# Patient Record
Sex: Male | Born: 1965
Health system: Southern US, Community
[De-identification: ages and names within clinical notes are randomized; demographics above are authoritative.]

## PROBLEM LIST (undated history)

## (undated) DIAGNOSIS — Z720 Tobacco use: Secondary | ICD-10-CM

## (undated) DIAGNOSIS — I809 Phlebitis and thrombophlebitis of unspecified site: Secondary | ICD-10-CM

## (undated) DIAGNOSIS — M549 Dorsalgia, unspecified: Secondary | ICD-10-CM

## (undated) DIAGNOSIS — E119 Type 2 diabetes mellitus without complications: Secondary | ICD-10-CM

## (undated) DIAGNOSIS — D689 Coagulation defect, unspecified: Secondary | ICD-10-CM

## (undated) DIAGNOSIS — E039 Hypothyroidism, unspecified: Secondary | ICD-10-CM

## (undated) DIAGNOSIS — I2510 Atherosclerotic heart disease of native coronary artery without angina pectoris: Secondary | ICD-10-CM

## (undated) DIAGNOSIS — E785 Hyperlipidemia, unspecified: Secondary | ICD-10-CM

## (undated) DIAGNOSIS — G4730 Sleep apnea, unspecified: Secondary | ICD-10-CM

## (undated) DIAGNOSIS — K449 Diaphragmatic hernia without obstruction or gangrene: Secondary | ICD-10-CM

## (undated) DIAGNOSIS — Z8669 Personal history of other diseases of the nervous system and sense organs: Secondary | ICD-10-CM

## (undated) DIAGNOSIS — R062 Wheezing: Secondary | ICD-10-CM

## (undated) DIAGNOSIS — J189 Pneumonia, unspecified organism: Secondary | ICD-10-CM

## (undated) DIAGNOSIS — G8929 Other chronic pain: Secondary | ICD-10-CM

## (undated) DIAGNOSIS — M1990 Unspecified osteoarthritis, unspecified site: Secondary | ICD-10-CM

## (undated) DIAGNOSIS — E669 Obesity, unspecified: Secondary | ICD-10-CM

## (undated) DIAGNOSIS — I10 Essential (primary) hypertension: Secondary | ICD-10-CM

## (undated) DIAGNOSIS — K219 Gastro-esophageal reflux disease without esophagitis: Secondary | ICD-10-CM

## (undated) HISTORY — DX: Gastro-esophageal reflux disease without esophagitis: K21.9

## (undated) HISTORY — DX: Dorsalgia, unspecified: M54.9

## (undated) HISTORY — DX: Atherosclerotic heart disease of native coronary artery without angina pectoris: I25.10

## (undated) HISTORY — DX: Tobacco use: Z72.0

## (undated) HISTORY — DX: Coagulation defect, unspecified: D68.9

## (undated) HISTORY — PX: UPPER GASTROINTESTINAL ENDOSCOPY: SHX188

## (undated) HISTORY — DX: Essential (primary) hypertension: I10

## (undated) HISTORY — DX: Phlebitis and thrombophlebitis of unspecified site: I80.9

## (undated) HISTORY — DX: Hyperlipidemia, unspecified: E78.5

## (undated) HISTORY — DX: Other chronic pain: G89.29

## (undated) HISTORY — DX: Type 2 diabetes mellitus without complications: E11.9

## (undated) HISTORY — PX: CERVICAL DISC SURGERY: SHX588

## (undated) HISTORY — DX: Obesity, unspecified: E66.9

## (undated) HISTORY — PX: WRIST SURGERY: SHX841

## (undated) HISTORY — DX: Wheezing: R06.2

## (undated) HISTORY — DX: Diaphragmatic hernia without obstruction or gangrene: K44.9

## (undated) HISTORY — DX: Personal history of other diseases of the nervous system and sense organs: Z86.69

## (undated) HISTORY — PX: WISDOM TOOTH EXTRACTION: SHX21

---

## 1998-04-25 ENCOUNTER — Encounter: Payer: Self-pay | Admitting: Emergency Medicine

## 1998-04-25 ENCOUNTER — Emergency Department (HOSPITAL_COMMUNITY): Admission: EM | Admit: 1998-04-25 | Discharge: 1998-04-25 | Payer: Self-pay | Admitting: Emergency Medicine

## 1998-12-04 ENCOUNTER — Emergency Department (HOSPITAL_COMMUNITY): Admission: EM | Admit: 1998-12-04 | Discharge: 1998-12-04 | Payer: Self-pay | Admitting: Emergency Medicine

## 1998-12-04 ENCOUNTER — Encounter: Payer: Self-pay | Admitting: Emergency Medicine

## 2000-04-05 ENCOUNTER — Encounter: Payer: Self-pay | Admitting: Emergency Medicine

## 2000-04-05 ENCOUNTER — Emergency Department (HOSPITAL_COMMUNITY): Admission: EM | Admit: 2000-04-05 | Discharge: 2000-04-05 | Payer: Self-pay | Admitting: Emergency Medicine

## 2001-06-26 ENCOUNTER — Emergency Department (HOSPITAL_COMMUNITY): Admission: EM | Admit: 2001-06-26 | Discharge: 2001-06-27 | Payer: Self-pay | Admitting: Emergency Medicine

## 2001-06-27 ENCOUNTER — Emergency Department (HOSPITAL_COMMUNITY): Admission: EM | Admit: 2001-06-27 | Discharge: 2001-06-27 | Payer: Self-pay | Admitting: Emergency Medicine

## 2001-06-27 ENCOUNTER — Encounter: Payer: Self-pay | Admitting: Emergency Medicine

## 2001-07-01 ENCOUNTER — Encounter: Payer: Self-pay | Admitting: Family Medicine

## 2001-07-01 ENCOUNTER — Encounter: Admission: RE | Admit: 2001-07-01 | Discharge: 2001-07-01 | Payer: Self-pay | Admitting: Family Medicine

## 2001-08-18 ENCOUNTER — Encounter: Payer: Self-pay | Admitting: Neurosurgery

## 2001-08-18 ENCOUNTER — Inpatient Hospital Stay (HOSPITAL_COMMUNITY): Admission: RE | Admit: 2001-08-18 | Discharge: 2001-08-19 | Payer: Self-pay | Admitting: Neurosurgery

## 2002-01-09 ENCOUNTER — Encounter: Admission: RE | Admit: 2002-01-09 | Discharge: 2002-01-09 | Payer: Self-pay | Admitting: Family Medicine

## 2002-01-09 ENCOUNTER — Encounter: Payer: Self-pay | Admitting: Family Medicine

## 2002-08-28 ENCOUNTER — Encounter: Payer: Self-pay | Admitting: Neurosurgery

## 2002-08-28 ENCOUNTER — Encounter: Admission: RE | Admit: 2002-08-28 | Discharge: 2002-08-28 | Payer: Self-pay | Admitting: Neurosurgery

## 2002-09-11 ENCOUNTER — Encounter: Admission: RE | Admit: 2002-09-11 | Discharge: 2002-09-11 | Payer: Self-pay | Admitting: Neurosurgery

## 2002-09-11 ENCOUNTER — Encounter: Payer: Self-pay | Admitting: Neurosurgery

## 2004-12-25 ENCOUNTER — Emergency Department (HOSPITAL_COMMUNITY): Admission: EM | Admit: 2004-12-25 | Discharge: 2004-12-25 | Payer: Self-pay | Admitting: Emergency Medicine

## 2006-06-05 ENCOUNTER — Emergency Department (HOSPITAL_COMMUNITY): Admission: EM | Admit: 2006-06-05 | Discharge: 2006-06-05 | Payer: Self-pay | Admitting: Emergency Medicine

## 2006-06-07 ENCOUNTER — Ambulatory Visit (HOSPITAL_COMMUNITY): Admission: RE | Admit: 2006-06-07 | Discharge: 2006-06-07 | Payer: Self-pay | Admitting: Neurosurgery

## 2006-11-29 ENCOUNTER — Encounter: Admission: RE | Admit: 2006-11-29 | Discharge: 2006-11-29 | Payer: Self-pay | Admitting: Family Medicine

## 2007-07-09 ENCOUNTER — Emergency Department (HOSPITAL_COMMUNITY): Admission: EM | Admit: 2007-07-09 | Discharge: 2007-07-09 | Payer: Self-pay | Admitting: Emergency Medicine

## 2007-08-23 ENCOUNTER — Encounter: Admission: RE | Admit: 2007-08-23 | Discharge: 2007-08-23 | Payer: Self-pay | Admitting: Anesthesiology

## 2008-03-16 ENCOUNTER — Emergency Department (HOSPITAL_COMMUNITY): Admission: EM | Admit: 2008-03-16 | Discharge: 2008-03-17 | Payer: Self-pay | Admitting: Emergency Medicine

## 2008-04-06 ENCOUNTER — Encounter: Admission: RE | Admit: 2008-04-06 | Discharge: 2008-04-06 | Payer: Self-pay | Admitting: Neurosurgery

## 2009-10-13 ENCOUNTER — Encounter: Admission: RE | Admit: 2009-10-13 | Discharge: 2009-10-13 | Payer: Self-pay | Admitting: Family Medicine

## 2010-03-20 ENCOUNTER — Encounter (INDEPENDENT_AMBULATORY_CARE_PROVIDER_SITE_OTHER): Payer: Self-pay | Admitting: Emergency Medicine

## 2010-03-20 ENCOUNTER — Ambulatory Visit: Payer: Self-pay | Admitting: Vascular Surgery

## 2010-03-20 ENCOUNTER — Emergency Department (HOSPITAL_COMMUNITY): Admission: EM | Admit: 2010-03-20 | Discharge: 2010-03-20 | Payer: Self-pay | Admitting: Emergency Medicine

## 2010-05-15 ENCOUNTER — Emergency Department (HOSPITAL_COMMUNITY): Admission: EM | Admit: 2010-05-15 | Discharge: 2010-05-16 | Payer: Self-pay | Admitting: Emergency Medicine

## 2010-05-16 ENCOUNTER — Encounter (INDEPENDENT_AMBULATORY_CARE_PROVIDER_SITE_OTHER): Payer: Self-pay | Admitting: Emergency Medicine

## 2010-05-16 ENCOUNTER — Ambulatory Visit: Payer: Self-pay | Admitting: Vascular Surgery

## 2010-09-08 LAB — BASIC METABOLIC PANEL
BUN: 10 mg/dL (ref 6–23)
Calcium: 9.3 mg/dL (ref 8.4–10.5)
Creatinine, Ser: 0.93 mg/dL (ref 0.4–1.5)
GFR calc non Af Amer: >60 mL/min (ref >60)

## 2010-09-08 LAB — PROTIME-INR
INR: 0.96 (ref 0.00–1.49)
Prothrombin Time: 13 seconds (ref 11.6–15.2)

## 2010-09-08 LAB — DIFFERENTIAL
Basophils Absolute: 0.1 10*3/uL (ref 0.0–0.1)
Basophils Relative: 1 % (ref 0–1)
Monocytes Absolute: 0.9 10*3/uL (ref 0.1–1.0)
Neutro Abs: 6.1 10*3/uL (ref 1.7–7.7)
Neutrophils Relative %: 60 % (ref 43–77)

## 2010-09-08 LAB — CBC
MCHC: 34.6 g/dL (ref 30.0–36.0)
Platelets: 233 10*3/uL (ref 150–400)
RDW: 13.6 % (ref 11.5–15.5)

## 2010-09-08 LAB — APTT: aPTT: 26 seconds (ref 24–37)

## 2010-09-10 LAB — CBC
MCV: 89 fL (ref 78.0–100.0)
Platelets: 226 10*3/uL (ref 150–400)
RDW: 13.8 % (ref 11.5–15.5)
WBC: 9.2 10*3/uL (ref 4.0–10.5)

## 2010-09-10 LAB — POCT CARDIAC MARKERS
CKMB, poc: 2.2 ng/mL (ref 1.0–8.0)
CKMB, poc: 2.6 ng/mL (ref 1.0–8.0)
Troponin i, poc: <0.05 ng/mL (ref 0.00–0.09)

## 2010-09-10 LAB — BASIC METABOLIC PANEL
CO2: 31 mEq/L (ref 19–32)
Calcium: 9.4 mg/dL (ref 8.4–10.5)
Creatinine, Ser: 0.79 mg/dL (ref 0.4–1.5)

## 2010-09-10 LAB — DIFFERENTIAL
Basophils Absolute: 0.1 10*3/uL (ref 0.0–0.1)
Eosinophils Absolute: 0.2 10*3/uL (ref 0.0–0.7)
Eosinophils Relative: 2 % (ref 0–5)
Lymphocytes Relative: 22 % (ref 12–46)

## 2010-09-10 LAB — PROTIME-INR: Prothrombin Time: 12.5 seconds (ref 11.6–15.2)
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
## 2010-11-13 NOTE — Op Note (Signed)
[place]  Patient:    AUDLEY, HINOJOS Visit Number: [id number] MRN: [id number]          Service Type: SUR Location: 3000 [id number] Attending Physician:  Emeterio Reeve Dictated by:   Payton Doughty, M.D. Proc. Date: 08/18/01 Admit Date:  08/18/2001                             Operative Report  PREOPERATIVE DIAGNOSIS:  Herniated disk at C4-5 and C5-6.  POSTOPERATIVE DIAGNOSIS:  Herniated disk at C4-5 and C5-6.  PROCEDURE:  C4-5, C5-6 anterior cervical diskectomy and fusion with Tether plate.  SURGEON:  Payton Doughty, M.D.  NURSE ASSISTANT:  [facility].  DOCTOR ASSISTANT:  Hewitt Shorts, M.D.  ANESTHESIA:  General endotracheal.  PREPARATION:  Sterile Betadine preparation and scrub with alcohol wipe.  COMPLICATIONS:  None.  DESCRIPTION OF PROCEDURE:  This is a 45 year old right-handed gentleman with left C5 and C6 radiculopathies because of disk at C4-5 and C5-6.  He was taken to the operating room and smoothly anesthetized and intubated and placed supine on the operating table.  Following shave, prep, and drape in the usual sterile fashion, the skin was incised in the midline at the medial border of the sternocleidomastoid muscle on the left side just at the level of the carotid tubercle.  The platysma was entered, identified, elevated, and divided and undermined.  The sternocleidomastoid was then identified, and medial dissection revealed the carotid artery retracted laterally to the left, trachea and esophagus retracted laterally to the right, exposing the bones of the anterior cervical spine.  A marker was placed and intraoperative x-ray obtained to confirm correctness of the level.  Having confirmed correctness of the level, the longus colli were taken down bilaterally and the Shadow Line retractor brought in and placed.  Diskectomy was then carried out at 4-5 and 5-6 under gross observation.  The operating microscope was then  brought in and microdissection technique used to dissect the disk and anterior epidural space and both neural foramina at C4-5 and C5-6.  On the left side at both 4-5 and 5-6, herniated disks were found.  C5-6 was the largest disk, 4-5 was somewhat smaller but still compressing the left 5 root.  Following complete decompression of the nerve roots, the wound was irrigated and hemostasis assured, and 7 mm bone grafts were fashioned from patellar allograft and tapped into place.  A two-level Tether plate was then placed with 14 mm screws, two in C4, one at C5, and two in C6.  Intraoperative x-ray showed good placement of bone graft, plate, and screws.  The platysma was reapproximated with 3-0 Vicryl in interrupted fashion, the subcutaneous tissue was reapproximated with 0 Vicryl in interrupted fashion, and the skin was closed with 4-0 Vicryl in running subcuticular fashion.  Benzoin and Steri-Strips were placed, made occlusive with Telfa and OpSite.  The patient was then placed in an Aspen collar and returned to the recovery room in good condition. Dictated by:   Payton Doughty, M.D. Attending Physician:  Emeterio Reeve DD:  08/18/01 TD:  08/19/01 Job: [id number]

## 2010-12-03 ENCOUNTER — Emergency Department (HOSPITAL_COMMUNITY)
Admission: EM | Admit: 2010-12-03 | Discharge: 2010-12-03 | Discharge status: Against Medical Advice | Payer: No Typology Code available for payment source | Attending: Emergency Medicine | Admitting: Emergency Medicine

## 2010-12-03 ENCOUNTER — Emergency Department (INDEPENDENT_AMBULATORY_CARE_PROVIDER_SITE_OTHER): Payer: No Typology Code available for payment source

## 2010-12-03 ENCOUNTER — Emergency Department (HOSPITAL_BASED_OUTPATIENT_CLINIC_OR_DEPARTMENT_OTHER)
Admission: EM | Admit: 2010-12-03 | Discharge: 2010-12-03 | Disposition: A | Discharge status: Home / Self Care | Payer: No Typology Code available for payment source | Attending: Emergency Medicine | Admitting: Emergency Medicine

## 2010-12-03 DIAGNOSIS — G8929 Other chronic pain: Secondary | ICD-10-CM | POA: Insufficient documentation

## 2010-12-03 DIAGNOSIS — K219 Gastro-esophageal reflux disease without esophagitis: Secondary | ICD-10-CM | POA: Insufficient documentation

## 2010-12-03 DIAGNOSIS — I10 Essential (primary) hypertension: Secondary | ICD-10-CM | POA: Insufficient documentation

## 2010-12-03 DIAGNOSIS — M899 Disorder of bone, unspecified: Secondary | ICD-10-CM

## 2010-12-03 DIAGNOSIS — M542 Cervicalgia: Secondary | ICD-10-CM

## 2010-12-03 DIAGNOSIS — Y92410 Unspecified street and highway as the place of occurrence of the external cause: Secondary | ICD-10-CM | POA: Insufficient documentation

## 2010-12-03 DIAGNOSIS — Z79899 Other long term (current) drug therapy: Secondary | ICD-10-CM | POA: Insufficient documentation

## 2010-12-03 DIAGNOSIS — Z043 Encounter for examination and observation following other accident: Secondary | ICD-10-CM | POA: Insufficient documentation

## 2010-12-29 ENCOUNTER — Other Ambulatory Visit: Payer: Self-pay | Admitting: Family Medicine

## 2010-12-29 DIAGNOSIS — M545 Low back pain: Secondary | ICD-10-CM

## 2010-12-29 DIAGNOSIS — M542 Cervicalgia: Secondary | ICD-10-CM

## 2011-01-05 ENCOUNTER — Ambulatory Visit
Admission: RE | Admit: 2011-01-05 | Discharge: 2011-01-05 | Disposition: A | Discharge status: Home / Self Care | Payer: No Typology Code available for payment source | Source: Ambulatory Visit | Attending: Family Medicine | Admitting: Family Medicine

## 2011-01-05 DIAGNOSIS — M545 Low back pain: Secondary | ICD-10-CM

## 2011-01-05 DIAGNOSIS — M542 Cervicalgia: Secondary | ICD-10-CM

## 2011-01-05 MED ORDER — GADOBENATE DIMEGLUMINE 529 MG/ML IV SOLN
20.0000 mL | Freq: Once | INTRAVENOUS | Status: AC | PRN
  Administered 2011-01-05: 20 mL via INTRAVENOUS

## 2011-03-17 LAB — DIFFERENTIAL
Basophils Absolute: 0.1
Eosinophils Absolute: 0.1
Eosinophils Relative: 2
Lymphocytes Relative: 20

## 2011-03-17 LAB — I-STAT 8, (EC8 V) (CONVERTED LAB)
BUN: 11
Bicarbonate: 33.9 — ABNORMAL HIGH
Glucose, Bld: 113 — ABNORMAL HIGH
Operator id: 284141
pCO2, Ven: 57.8 — ABNORMAL HIGH
pH, Ven: 7.376 — ABNORMAL HIGH

## 2011-03-17 LAB — CBC
HCT: 47.8
Platelets: 304
RDW: 14.9

## 2011-03-17 LAB — B-NATRIURETIC PEPTIDE (CONVERTED LAB): Pro B Natriuretic peptide (BNP): 191 — ABNORMAL HIGH

## 2011-04-10 ENCOUNTER — Emergency Department (HOSPITAL_COMMUNITY)
Admission: EM | Admit: 2011-04-10 | Discharge: 2011-04-10 | Disposition: A | Discharge status: Home / Self Care | Payer: Medicare Other | Attending: Emergency Medicine | Admitting: Emergency Medicine

## 2011-04-10 DIAGNOSIS — M7989 Other specified soft tissue disorders: Secondary | ICD-10-CM

## 2011-04-10 DIAGNOSIS — IMO0002 Reserved for concepts with insufficient information to code with codable children: Secondary | ICD-10-CM | POA: Insufficient documentation

## 2011-04-10 DIAGNOSIS — I10 Essential (primary) hypertension: Secondary | ICD-10-CM | POA: Insufficient documentation

## 2011-04-10 DIAGNOSIS — M79609 Pain in unspecified limb: Secondary | ICD-10-CM

## 2011-04-10 DIAGNOSIS — Z9889 Other specified postprocedural states: Secondary | ICD-10-CM | POA: Insufficient documentation

## 2011-04-10 DIAGNOSIS — K219 Gastro-esophageal reflux disease without esophagitis: Secondary | ICD-10-CM | POA: Insufficient documentation

## 2011-04-10 DIAGNOSIS — I8000 Phlebitis and thrombophlebitis of superficial vessels of unspecified lower extremity: Secondary | ICD-10-CM | POA: Insufficient documentation

## 2011-04-10 DIAGNOSIS — Z79899 Other long term (current) drug therapy: Secondary | ICD-10-CM | POA: Insufficient documentation

## 2011-04-10 DIAGNOSIS — R252 Cramp and spasm: Secondary | ICD-10-CM | POA: Insufficient documentation

## 2011-04-27 ENCOUNTER — Encounter: Payer: Self-pay | Admitting: Vascular Surgery

## 2011-04-27 ENCOUNTER — Ambulatory Visit (INDEPENDENT_AMBULATORY_CARE_PROVIDER_SITE_OTHER): Payer: 59 | Admitting: Vascular Surgery

## 2011-04-27 ENCOUNTER — Other Ambulatory Visit (INDEPENDENT_AMBULATORY_CARE_PROVIDER_SITE_OTHER): Payer: 59

## 2011-04-27 VITALS — BP 118/76 | HR 84 | Resp 20 | Ht 73.0 in | Wt 303.0 lb

## 2011-04-27 DIAGNOSIS — I8000 Phlebitis and thrombophlebitis of superficial vessels of unspecified lower extremity: Secondary | ICD-10-CM

## 2011-04-27 DIAGNOSIS — I808 Phlebitis and thrombophlebitis of other sites: Secondary | ICD-10-CM
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
## 2011-04-29 ENCOUNTER — Other Ambulatory Visit: Payer: Self-pay

## 2011-04-29 DIAGNOSIS — I8000 Phlebitis and thrombophlebitis of superficial vessels of unspecified lower extremity: Secondary | ICD-10-CM

## 2011-04-29 DIAGNOSIS — M79609 Pain in unspecified limb: Secondary | ICD-10-CM
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
## 2011-07-28 DIAGNOSIS — I82409 Acute embolism and thrombosis of unspecified deep veins of unspecified lower extremity: Secondary | ICD-10-CM | POA: Diagnosis not present

## 2011-07-28 DIAGNOSIS — Z7901 Long term (current) use of anticoagulants: Secondary | ICD-10-CM | POA: Diagnosis not present

## 2011-08-03 DIAGNOSIS — I82409 Acute embolism and thrombosis of unspecified deep veins of unspecified lower extremity: Secondary | ICD-10-CM | POA: Diagnosis not present

## 2011-09-01 DIAGNOSIS — I82409 Acute embolism and thrombosis of unspecified deep veins of unspecified lower extremity: Secondary | ICD-10-CM | POA: Diagnosis not present

## 2011-09-01 DIAGNOSIS — Z7901 Long term (current) use of anticoagulants: Secondary | ICD-10-CM | POA: Diagnosis not present

## 2011-11-08 ENCOUNTER — Ambulatory Visit: Payer: 59 | Admitting: Family Medicine

## 2011-11-08 VITALS — BP 125/89 | HR 80 | Temp 97.5°F | Resp 18 | Ht 73.5 in | Wt 282.0 lb

## 2011-11-08 DIAGNOSIS — R05 Cough: Secondary | ICD-10-CM

## 2011-11-08 DIAGNOSIS — E118 Type 2 diabetes mellitus with unspecified complications: Secondary | ICD-10-CM | POA: Insufficient documentation

## 2011-11-08 DIAGNOSIS — E119 Type 2 diabetes mellitus without complications: Secondary | ICD-10-CM

## 2011-11-08 DIAGNOSIS — I10 Essential (primary) hypertension: Secondary | ICD-10-CM | POA: Diagnosis not present

## 2011-11-08 DIAGNOSIS — G4730 Sleep apnea, unspecified: Secondary | ICD-10-CM | POA: Insufficient documentation

## 2011-11-08 DIAGNOSIS — R059 Cough, unspecified: Secondary | ICD-10-CM

## 2011-11-08 DIAGNOSIS — J069 Acute upper respiratory infection, unspecified: Secondary | ICD-10-CM | POA: Diagnosis not present

## 2011-11-08 DIAGNOSIS — E785 Hyperlipidemia, unspecified: Secondary | ICD-10-CM | POA: Insufficient documentation

## 2011-11-08 DIAGNOSIS — K219 Gastro-esophageal reflux disease without esophagitis: Secondary | ICD-10-CM

## 2011-11-08 LAB — GLUCOSE, POCT (MANUAL RESULT ENTRY): POC Glucose: 341

## 2011-11-08 LAB — POCT CBC
Lymph, poc: 1.5 (ref 0.6–3.4)
MCHC: 32.5 g/dL (ref 31.8–35.4)
MID (cbc): 0.6 (ref 0–0.9)
MPV: 9.4 fL (ref 0–99.8)
POC Granulocyte: 6.4 (ref 2–6.9)
POC LYMPH PERCENT: 17.9 %L (ref 10–50)
POC MID %: 7.1 %M (ref 0–12)
RDW, POC: 14.6 %

## 2011-11-08 LAB — COMPREHENSIVE METABOLIC PANEL
AST: 38 U/L — ABNORMAL HIGH (ref 0–37)
Alkaline Phosphatase: 71 U/L (ref 39–117)
BUN: 13 mg/dL (ref 6–23)
Calcium: 9.8 mg/dL (ref 8.4–10.5)
Creat: 0.83 mg/dL (ref 0.50–1.35)
Glucose, Bld: 328 mg/dL — ABNORMAL HIGH (ref 70–99)

## 2011-11-08 MED ORDER — METFORMIN HCL 500 MG PO TABS: 500.0000 mg | ORAL_TABLET | Freq: Two times a day (BID) | ORAL | Status: DC

## 2011-11-08 MED ORDER — ATORVASTATIN CALCIUM 20 MG PO TABS: 20.0000 mg | ORAL_TABLET | Freq: Every day | ORAL | Status: DC

## 2011-11-08 MED ORDER — HYDROCHLOROTHIAZIDE 25 MG PO TABS: 25.0000 mg | ORAL_TABLET | Freq: Every day | ORAL | Status: DC

## 2011-11-08 MED ORDER — ESOMEPRAZOLE MAGNESIUM 40 MG PO CPDR: 40.0000 mg | DELAYED_RELEASE_CAPSULE | Freq: Every day | ORAL | Status: DC

## 2011-11-08 MED ORDER — SAXAGLIPTIN HCL 5 MG PO TABS: 5.0000 mg | ORAL_TABLET | Freq: Every day | ORAL | Status: DC

## 2011-11-08 MED ORDER — IPRATROPIUM BROMIDE 0.06 % NA SOLN: 2.0000 | Freq: Four times a day (QID) | NASAL | Status: DC

## 2011-11-08 MED ORDER — HYDROCODONE-HOMATROPINE 5-1.5 MG/5ML PO SYRP: 5.0000 mL | ORAL_SOLUTION | ORAL | Status: AC | PRN

## 2011-11-08 MED ORDER — AMLODIPINE BESYLATE 5 MG PO TABS: 5.0000 mg | ORAL_TABLET | Freq: Every day | ORAL | Status: DC

## 2011-11-08 MED ORDER — GLUCOSE BLOOD VI STRP: ORAL_STRIP | Status: DC

## 2011-11-08 MED ORDER — RAMIPRIL 10 MG PO CAPS: 10.0000 mg | ORAL_CAPSULE | Freq: Every day | ORAL | Status: DC
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
## 2011-11-08 NOTE — Patient Instructions (Signed)
Work hard on the diabetes control with watch your diet and continuing to exercise the best you can and to lose weight.  And the metformin twice daily to your medications.  He nose spray will help reduce the congestion. The cough syrup will probably make it a little drowsy, especially since you are already on the pain medications.

## 2011-11-09 ENCOUNTER — Encounter: Payer: Self-pay | Admitting: Family Medicine

## 2011-11-09 ENCOUNTER — Telehealth: Payer: Self-pay

## 2011-11-09 NOTE — Telephone Encounter (Signed)
Colin Ramirez from [facility] calling to find out how many times pt test for his sugar, pharmacy is needind to know because insurance will not pay if it doesn't match what they have in the system. [phone] [facility]

## 2011-11-09 NOTE — Telephone Encounter (Signed)
Pharmacist called again and would like to know how many timed he takes his blood sugar. Spoke with Dr Alwyn Ren, ok to tell them 3 times a day as needed. Spoke with Weston Brass at [facility] to let him know.

## 2011-11-10 NOTE — Telephone Encounter (Signed)
Pt wife states that pt is not feeling any better, pt is still having congestion. Pts wife suggests that a rx for Z-Pac be called in if possible.

## 2011-11-11 NOTE — Telephone Encounter (Signed)
Dr Alwyn Ren, do you want to Rx an Abx for pt, or anything else?

## 2011-11-11 NOTE — Telephone Encounter (Signed)
Amoxicillin 875  #20  one twice daily.

## 2011-11-12 MED ORDER — AMOXICILLIN 875 MG PO TABS: 875.0000 mg | ORAL_TABLET | Freq: Two times a day (BID) | ORAL | Status: AC

## 2011-11-12 NOTE — Telephone Encounter (Signed)
LMOM ADVISING THAT RX SENT INTO PHARMACY

## 2011-11-16 ENCOUNTER — Ambulatory Visit (INDEPENDENT_AMBULATORY_CARE_PROVIDER_SITE_OTHER): Payer: 59 | Admitting: Family Medicine

## 2011-11-16 VITALS — BP 122/82 | HR 74 | Temp 98.4°F | Resp 16 | Ht 73.5 in | Wt 286.0 lb

## 2011-11-16 DIAGNOSIS — J40 Bronchitis, not specified as acute or chronic: Secondary | ICD-10-CM | POA: Diagnosis not present

## 2011-11-16 MED ORDER — HYDROCODONE-HOMATROPINE 5-1.5 MG/5ML PO SYRP: 5.0000 mL | ORAL_SOLUTION | Freq: Three times a day (TID) | ORAL | Status: AC | PRN

## 2011-11-16 MED ORDER — ALBUTEROL SULFATE HFA 108 (90 BASE) MCG/ACT IN AERS: 2.0000 | INHALATION_SPRAY | Freq: Four times a day (QID) | RESPIRATORY_TRACT | Status: DC | PRN

## 2011-11-16 MED ORDER — AZITHROMYCIN 250 MG PO TABS: ORAL_TABLET | ORAL | Status: AC

## 2011-11-16 NOTE — Patient Instructions (Signed)

## 2011-11-16 NOTE — Progress Notes (Signed)
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 He was initially treated with cough medicine and a nasal inhaler, but the problem has gotten worse with a deep cough now and continue congestion. The cough is productive of white phlegm he's had no fever. He's not having significant shortness of breath.  Patient has had diabetes which has been relatively well-controlled with occasionally getting levels over 200 but usually under 200. He will be due for a diabetes recheck in 3 weeks.  Objective: Overweight gentleman in no acute distress  HEENT: Unremarkable  Neck: No adenopathy or thyromegaly  Chest: Bilateral wheezes, no rales  Heart: Regular no murmur  Skin: Warm and dry without rashes  Assessment: Acute bronchitis without respiratory compromise  Plan: Z-Pak, inhaler, and Hydromet. Her graft schedule diabetes check for 3 weeks and physical subsequently.

## 2011-12-02 ENCOUNTER — Ambulatory Visit (INDEPENDENT_AMBULATORY_CARE_PROVIDER_SITE_OTHER): Payer: 59 | Admitting: Family Medicine

## 2011-12-02 ENCOUNTER — Encounter: Payer: Self-pay | Admitting: Family Medicine

## 2011-12-02 VITALS — BP 121/87 | HR 84 | Temp 98.1°F | Resp 18 | Ht 73.5 in | Wt 286.8 lb

## 2011-12-02 DIAGNOSIS — IMO0002 Reserved for concepts with insufficient information to code with codable children: Secondary | ICD-10-CM

## 2011-12-02 DIAGNOSIS — IMO0001 Reserved for inherently not codable concepts without codable children: Secondary | ICD-10-CM | POA: Diagnosis not present

## 2011-12-02 DIAGNOSIS — M898X9 Other specified disorders of bone, unspecified site: Secondary | ICD-10-CM

## 2011-12-02 DIAGNOSIS — F17200 Nicotine dependence, unspecified, uncomplicated: Secondary | ICD-10-CM

## 2011-12-02 DIAGNOSIS — M899 Disorder of bone, unspecified: Secondary | ICD-10-CM

## 2011-12-02 DIAGNOSIS — Z23 Encounter for immunization: Secondary | ICD-10-CM | POA: Diagnosis not present

## 2011-12-02 DIAGNOSIS — Z0000 Encounter for general adult medical examination without abnormal findings: Secondary | ICD-10-CM | POA: Diagnosis not present

## 2011-12-02 DIAGNOSIS — Z720 Tobacco use: Secondary | ICD-10-CM

## 2011-12-02 DIAGNOSIS — E669 Obesity, unspecified: Secondary | ICD-10-CM | POA: Insufficient documentation

## 2011-12-02 DIAGNOSIS — E6609 Other obesity due to excess calories: Secondary | ICD-10-CM | POA: Insufficient documentation

## 2011-12-02 DIAGNOSIS — Z1212 Encounter for screening for malignant neoplasm of rectum: Secondary | ICD-10-CM

## 2011-12-02 DIAGNOSIS — E039 Hypothyroidism, unspecified: Secondary | ICD-10-CM

## 2011-12-02 DIAGNOSIS — M949 Disorder of cartilage, unspecified: Secondary | ICD-10-CM | POA: Diagnosis not present

## 2011-12-02 LAB — POCT URINALYSIS DIPSTICK
Blood, UA: NEGATIVE
Glucose, UA: 100
Ketones, UA: NEGATIVE
Spec Grav, UA: 1.02
Urobilinogen, UA: 0.2

## 2011-12-02 LAB — LIPID PANEL
HDL: 28 mg/dL — ABNORMAL LOW (ref >39)
LDL Cholesterol: 78 mg/dL (ref 0–99)
Total CHOL/HDL Ratio: 5.3 Ratio
Triglycerides: 215 mg/dL — ABNORMAL HIGH (ref <150)

## 2011-12-02 LAB — TSH: TSH: 5.957 u[IU]/mL — ABNORMAL HIGH (ref 0.350–4.500)

## 2011-12-02 LAB — IFOBT (OCCULT BLOOD): IFOBT: NEGATIVE

## 2011-12-02 NOTE — Patient Instructions (Addendum)
Keeping you healthy  Get these tests  Blood pressure- Have your blood pressure checked once a year by your healthcare provider.  Normal blood pressure is 120/80  Weight- Have your body mass index (BMI) calculated to screen for obesity.  BMI is a measure of body fat based on height and weight. You can also calculate your own BMI at [email or web address].  Cholesterol- Have your cholesterol checked every year.  Diabetes- Have your blood sugar checked regularly if you have high blood pressure, high cholesterol, have a family history of diabetes or if you are overweight.  Screening for Colon Cancer- Colonoscopy starting at age 22.  Screening may begin sooner depending on your family history and other health conditions. Follow up colonoscopy as directed by your Gastroenterologist.  Screening for Prostate Cancer- Both blood work (PSA) and a rectal exam help screen for Prostate Cancer.  Screening begins at age 38 with African-American men and at age 81 with Caucasian men.  Screening may begin sooner depending on your family history.  Take these medicines  Aspirin- One aspirin daily can help prevent Heart disease and Stroke.  Flu shot- Every fall.  Tetanus- Every 10 years.  Zostavax- Once after the age of 48 to prevent Shingles.  Pneumonia shot- Once after the age of 48; if you are younger than 81, ask your healthcare provider if you need a Pneumonia shot.  Take these steps  Don't smoke- If you do smoke, talk to your doctor about quitting.  For tips on how to quit, go to www.smokefree.gov or call 1-800-QUIT-NOW.  Be physically active- Exercise 5 days a week for at least 30 minutes.  If you are not already physically active start slow and gradually work up to 30 minutes of moderate physical activity.  Examples of moderate activity include walking briskly, mowing the yard, dancing, swimming, bicycling, etc.  Eat a healthy diet- Eat a variety of healthy food such as fruits, vegetables, low  fat milk, low fat cheese, yogurt, lean meant, poultry, fish, beans, tofu, etc. For more information go to www.thenutritionsource.org  Drink alcohol in moderation- Limit alcohol intake to less than two drinks a day. Never drink and drive.  Dentist- Brush and floss twice daily; visit your dentist twice a year.  Depression- Your emotional health is as important as your physical health. If you're feeling down, or losing interest in things you would normally enjoy please talk to your healthcare provider.  Eye exam- Visit your eye doctor every year.  Safe sex- If you may be exposed to a sexually transmitted infection, use a condom.  Seat belts- Seat belts can save your life; always wear one.  Smoke/Carbon Monoxide detectors- These detectors need to be installed on the appropriate level of your home.  Replace batteries at least once a year.  Skin cancer- When out in the sun, cover up and use sunscreen 15 SPF or higher.  Violence- If anyone is threatening you, please tell your healthcare provider. Living Will/ Health care power of attorney- Speak with your healthcare provider and family.     Diabetes Meal Planning Guide The diabetes meal planning guide is a tool to help you plan your meals and snacks. It is important for people with diabetes to manage their blood glucose (sugar) levels. Choosing the right foods and the right amounts throughout your day will help control your blood glucose. Eating right can even help you improve your blood pressure and reach or maintain a healthy weight. CARBOHYDRATE COUNTING MADE EASY When you eat carbohydrates,  they turn to sugar. This raises your blood glucose level. Counting carbohydrates can help you control this level so you feel better. When you plan your meals by counting carbohydrates, you can have more flexibility in what you eat and balance your medicine with your food intake. Carbohydrate counting simply means adding up the total amount of carbohydrate  grams in your meals and snacks. Try to eat about the same amount at each meal. Foods with carbohydrates are listed below. Each portion below is 1 carbohydrate serving or 15 grams of carbohydrates. Ask your dietician how many grams of carbohydrates you should eat at each meal or snack. Grains and Starches 1 slice bread.   English muffin or hotdog/hamburger bun.   cup cold cereal (unsweetened).  ? cup cooked pasta or rice.   cup starchy vegetables (corn, potatoes, peas, beans, winter squash).  1 tortilla (6 inches).   bagel.  1 waffle or pancake (size of a CD).   cup cooked cereal.  4 to 6 small crackers.  *Whole grain is recommended. Fruit 1 cup fresh unsweetened berries, melon, papaya, pineapple.  1 small fresh fruit.   banana or mango.   cup fruit juice (4 oz unsweetened).   cup canned fruit in natural juice or water.  2 tbs dried fruit.  12 to 15 grapes or cherries.  Milk and Yogurt 1 cup fat-free or 1% milk.  1 cup soy milk.  6 oz light yogurt with sugar-free sweetener.  6 oz low-fat soy yogurt.  6 oz plain yogurt.  Vegetables 1 cup raw or  cup cooked is counted as 0 carbohydrates or a "free" food.  If you eat 3 or more servings at 1 meal, count them as 1 carbohydrate serving.  Other Carbohydrates  oz chips or pretzels.   cup ice cream or frozen yogurt.   cup sherbet or sorbet.  2 inch square cake, no frosting.  1 tbs honey, sugar, jam, jelly, or syrup.  2 small cookies.  3 squares of graham crackers.  3 cups popcorn.  6 crackers.  1 cup broth-based soup.  Count 1 cup casserole or other mixed foods as 2 carbohydrate servings.  Foods with less than 20 calories in a serving may be counted as 0 carbohydrates or a "free" food.  You may want to purchase a book or computer software that lists the carbohydrate gram counts of different foods. In addition, the nutrition facts panel on the labels of the foods you eat are a good source of this information. The label  will tell you how big the serving size is and the total number of carbohydrate grams you will be eating per serving. Divide this number by 15 to obtain the number of carbohydrate servings in a portion. Remember, 1 carbohydrate serving equals 15 grams of carbohydrate. SERVING SIZES Measuring foods and serving sizes helps you make sure you are getting the right amount of food. The list below tells how big or small some common serving sizes are. 1 oz.........4 stacked dice.  3 oz........Marland KitchenDeck of cards.  1 tsp.......Marland KitchenTip of little finger.  1 tbs......Marland KitchenMarland KitchenThumb.  2 tbs.......Marland KitchenGolf ball.   cup......Marland KitchenHalf of a fist.  1 cup.......Marland KitchenA fist.  SAMPLE DIABETES MEAL PLAN Below is a sample meal plan that includes foods from the grain and starches, dairy, vegetable, fruit, and meat groups. A dietician can individualize a meal plan to fit your calorie needs and tell you the number of servings needed from each food group. However, controlling the total amount of carbohydrates  in your meal or snack is more important than making sure you include all of the food groups at every meal. You may interchange carbohydrate containing foods (dairy, starches, and fruits). The meal plan below is an example of a 2000 calorie diet using carbohydrate counting. This meal plan has 17 carbohydrate servings. Breakfast 1 cup oatmeal (2 carb servings).   cup light yogurt (1 carb serving).  1 cup blueberries (1 carb serving).   cup almonds.  Snack 1 large apple (2 carb servings).  1 low-fat string cheese stick.  Lunch Chicken breast salad.  1 cup spinach.   cup chopped tomatoes.  2 oz chicken breast, sliced.  2 tbs low-fat [place] dressing.  12 whole-wheat crackers (2 carb servings).  12 to 15 grapes (1 carb serving).  1 cup low-fat milk (1 carb serving).  Snack 1 cup carrots.   cup hummus (1 carb serving).  Dinner 3 oz broiled salmon.  1 cup brown rice (3 carb servings).  Snack 1  cups steamed broccoli (1 carb  serving) drizzled with 1 tsp olive oil and lemon juice.  1 cup light pudding (2 carb servings).  DIABETES MEAL PLANNING WORKSHEET Your dietician can use this worksheet to help you decide how many servings of foods and what types of foods are right for you.  BREAKFAST Food Group and Servings / Carb Servings Grain/Starches __________________________________ Dairy __________________________________________ Vegetable ______________________________________ Fruit ___________________________________________ Meat __________________________________________ Fat ____________________________________________ LUNCH Food Group and Servings / Carb Servings Grain/Starches ___________________________________ Dairy ___________________________________________ Fruit ____________________________________________ Meat ___________________________________________ Fat _____________________________________________ Laural Golden Food Group and Servings / Carb Servings Grain/Starches ___________________________________ Dairy ___________________________________________ Fruit ____________________________________________ Meat ___________________________________________ Fat _____________________________________________ SNACKS Food Group and Servings / Carb Servings Grain/Starches ___________________________________ Dairy ___________________________________________ Vegetable _______________________________________ Fruit ____________________________________________ Meat ___________________________________________ Fat _____________________________________________ DAILY TOTALS Starches _________________________ Vegetable ________________________ Fruit ____________________________ Dairy ____________________________ Meat ____________________________ Fat ______________________________ Document Released: 03/11/2005 Document Revised: 06/03/2011 Document Reviewed: 01/20/2009  Exit[employer] Patient Information 2012 [place]
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
## 2011-12-03 LAB — VITAMIN D 25 HYDROXY (VIT D DEFICIENCY, FRACTURES): Vit D, 25-Hydroxy: 50 ng/mL (ref 30–89)

## 2011-12-07 ENCOUNTER — Encounter: Payer: Self-pay | Admitting: Family Medicine

## 2011-12-12 ENCOUNTER — Other Ambulatory Visit: Payer: Self-pay | Admitting: Family Medicine

## 2011-12-12 MED ORDER — LEVOTHYROXINE SODIUM 100 MCG PO TABS: 100.0000 ug | ORAL_TABLET | Freq: Every day | ORAL | Status: DC

## 2011-12-12 NOTE — Progress Notes (Signed)
Quick Note:  Please call pt and advise that the following labs are abnormal... Lipids are abnormal and this is related to Diabetes; HDL("good") cholesterol is too low and Triglycerides are high because of some of the food choices and how foods are prepared.  Thyroid medication dose is being increased. Pick up new prescription from pharmacy.  Other labs are normal.  Copy to pt. ______

## 2011-12-16 DIAGNOSIS — D126 Benign neoplasm of colon, unspecified: Secondary | ICD-10-CM | POA: Diagnosis not present

## 2011-12-16 DIAGNOSIS — Z800 Family history of malignant neoplasm of digestive organs: Secondary | ICD-10-CM | POA: Diagnosis not present

## 2012-01-24 ENCOUNTER — Ambulatory Visit (INDEPENDENT_AMBULATORY_CARE_PROVIDER_SITE_OTHER): Payer: 59 | Admitting: Family Medicine

## 2012-01-24 VITALS — BP 118/79 | HR 69 | Temp 97.5°F | Resp 18 | Ht 73.5 in | Wt 282.0 lb

## 2012-01-24 DIAGNOSIS — R197 Diarrhea, unspecified: Secondary | ICD-10-CM

## 2012-01-24 DIAGNOSIS — E119 Type 2 diabetes mellitus without complications: Secondary | ICD-10-CM

## 2012-01-24 LAB — POCT CBC
Granulocyte percent: 77.9 %G (ref 37–80)
HCT, POC: 51.6 % (ref 43.5–53.7)
Lymph, poc: 1.5 (ref 0.6–3.4)
MCH, POC: 29 pg (ref 27–31.2)
MCHC: 31.2 g/dL — AB (ref 31.8–35.4)
MCV: 93 fL (ref 80–97)
MID (cbc): 0.5 (ref 0–0.9)
POC LYMPH PERCENT: 16.5 %L (ref 10–50)
Platelet Count, POC: 241 10*3/uL (ref 142–424)
RDW, POC: 15.4 %

## 2012-01-24 MED ORDER — CIPROFLOXACIN HCL 500 MG PO TABS: 500.0000 mg | ORAL_TABLET | Freq: Two times a day (BID) | ORAL | Status: DC
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
## 2012-01-25 ENCOUNTER — Telehealth: Payer: Self-pay

## 2012-01-25 NOTE — Telephone Encounter (Signed)
Pt's wife has called stating that her husband needs a referral for cpap machine -sounds like he needs replacement parts  Best number [facility] [phone] or wife [phone]  They want this to be with dr Robynn Pane

## 2012-01-26 NOTE — Telephone Encounter (Signed)
Pt needs new mask and hose and container,for CPAP was seeing Dr Zipporah Plants, but can not get an appt because he has be d/c from [facility], please advise if we can do this for him? He gets from [facility].

## 2012-01-27 ENCOUNTER — Ambulatory Visit (INDEPENDENT_AMBULATORY_CARE_PROVIDER_SITE_OTHER): Payer: 59 | Admitting: Family Medicine

## 2012-01-27 VITALS — BP 122/92 | HR 76 | Temp 97.8°F | Resp 16 | Ht 73.5 in | Wt 279.0 lb

## 2012-01-27 DIAGNOSIS — R197 Diarrhea, unspecified: Secondary | ICD-10-CM

## 2012-01-27 DIAGNOSIS — E119 Type 2 diabetes mellitus without complications: Secondary | ICD-10-CM | POA: Diagnosis not present

## 2012-01-27 LAB — GLUCOSE, POCT (MANUAL RESULT ENTRY): POC Glucose: 102 mg/dl — AB (ref 70–99)

## 2012-01-27 MED ORDER — CIPROFLOXACIN HCL 500 MG PO TABS: 500.0000 mg | ORAL_TABLET | Freq: Two times a day (BID) | ORAL | Status: AC

## 2012-01-27 NOTE — Telephone Encounter (Signed)
I have called patient to advise to have sleep study sent over, he was told his apnea was so severe he did not ever need to have another one again. I told him I will look at his paper chart and see if we have record.
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
## 2012-01-27 NOTE — Telephone Encounter (Signed)
If we have a copy of his sleep study, we can fax that to them for renewal of his supplies. If not, he may need to get Dr. Zipporah Plants to fax to them.

## 2012-01-28 NOTE — Telephone Encounter (Signed)
Left message for him can not proceed with this until we have the sleep study, I have looked in his paper chart and in the computer. I do not have it. He will send it to my attention and I can take care of this.

## 2012-02-02 ENCOUNTER — Ambulatory Visit: Payer: 59 | Admitting: Family Medicine

## 2012-02-16 ENCOUNTER — Telehealth: Payer: Self-pay

## 2012-02-16 NOTE — Telephone Encounter (Signed)
Pt states that he dropped off papers two weeks ago regarding his sleep study and cpap machine and Dr, Merla Riches was supposed to send them to [facility] on [address] and the patient has not heard anything as to what the next step will be   Please call [phone]  dfb

## 2012-02-17 NOTE — Telephone Encounter (Signed)
After checking all of the MDs boxes below and speaking w/Med Recs again, called pt to get more information about if/when/where he dropped it off. Pt stated it was in a reg white envelope and he dropped it at the front desk at 104, not 102. Advised pt that I had called over there earlier, but will go and check myself and call him back. Checked at 104 and front desk remembers getting envelope and putting it in box for Med Rec p/up. Looked also through scan pile for time period and could not locate. Contacted pt and he agreed to get another copy of the report and order and bring to 102. I asked him to have them give it to [place].

## 2012-02-17 NOTE — Telephone Encounter (Signed)
I don't see my name on the chart anywhere / want to checked with Dr. Epimenio Sarin or [place] I don't see where any of them ordered a sleep test eitherEven though all of them have notes in Epic on this patient

## 2012-02-17 NOTE — Telephone Encounter (Signed)
Dr Merla Riches, do you happen to remember this ppw coming to you to fill out? I don't see it in your box and [facility] states they did not receive it.

## 2012-02-20 ENCOUNTER — Ambulatory Visit (INDEPENDENT_AMBULATORY_CARE_PROVIDER_SITE_OTHER): Payer: 59 | Admitting: Emergency Medicine

## 2012-02-20 VITALS — BP 100/76 | HR 80 | Temp 98.1°F | Resp 16 | Ht 74.0 in | Wt 275.0 lb

## 2012-02-20 DIAGNOSIS — R197 Diarrhea, unspecified: Secondary | ICD-10-CM | POA: Diagnosis not present

## 2012-02-20 LAB — COMPREHENSIVE METABOLIC PANEL
ALT: 56 U/L — ABNORMAL HIGH (ref 0–53)
AST: 25 U/L (ref 0–37)
Alkaline Phosphatase: 55 U/L (ref 39–117)
BUN: 14 mg/dL (ref 6–23)
Calcium: 9.5 mg/dL (ref 8.4–10.5)
Chloride: 102 mEq/L (ref 96–112)
Creat: 0.81 mg/dL (ref 0.50–1.35)
Potassium: 3.4 mEq/L — ABNORMAL LOW (ref 3.5–5.3)

## 2012-02-20 LAB — POCT CBC
Granulocyte percent: 73.5 %G (ref 37–80)
HCT, POC: 53 % (ref 43.5–53.7)
Hemoglobin: 16.4 g/dL (ref 14.1–18.1)
MCV: 91.7 fL (ref 80–97)
POC LYMPH PERCENT: 19.7 %L (ref 10–50)
RDW, POC: 15.4 %
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
## 2012-02-21 ENCOUNTER — Telehealth: Payer: Self-pay

## 2012-02-21 NOTE — Telephone Encounter (Signed)
LMOM that Rx was faxed for CPAP supplies and I have left copy of his sleep study at front desk if he would like to p/up for his records.

## 2012-02-22 ENCOUNTER — Telehealth: Payer: Self-pay

## 2012-02-22 LAB — OVA AND PARASITE SCREEN: OP: NONE SEEN

## 2012-02-22 NOTE — Telephone Encounter (Signed)
Called [facility] to see if they need a copy of sleep study and if anything else needed, and was told that pt will need a "Face - to - Face" OV w/documentation that pt is still using CPAP machine Qhs and Dx in order for ins to cover a new machine. Notified pt and he will plan to discuss w/provider when he RTC.

## 2012-02-22 NOTE — Telephone Encounter (Signed)
RX given to [name], RN on 02/22/12.She will fax RX to [facility].

## 2012-02-22 NOTE — Telephone Encounter (Signed)
Pt called and thanked [place] for getting the Rxs for CPAP supplies over to [facility]. They had told him that he has had his machine long enough that he could get a replacement if it is getting worn. Pt asked if we could send a Rx over to them for the CPAP machine so that he could get this replaced as well. Dr Audria Nine, I am sending this to you since you saw pt for his PE and will bring you a copy of his sleep study.

## 2012-02-23 LAB — STOOL CULTURE

## 2012-02-23 LAB — OVA AND PARASITE SCREEN
OP: NONE SEEN
OP: NONE SEEN

## 2012-03-07 LAB — CLOSTRIDIUM DIFFICILE TOXIN

## 2012-03-17 ENCOUNTER — Other Ambulatory Visit: Payer: Self-pay | Admitting: Family Medicine

## 2012-03-30 ENCOUNTER — Encounter: Payer: Self-pay | Admitting: Family Medicine

## 2012-04-25 ENCOUNTER — Telehealth: Payer: Self-pay

## 2012-04-25 MED ORDER — METFORMIN HCL 500 MG PO TABS: 500.0000 mg | ORAL_TABLET | Freq: Two times a day (BID) | ORAL | Status: DC

## 2012-04-25 NOTE — Telephone Encounter (Signed)
Spoke with pt advised Rx sent to [facility]

## 2012-04-25 NOTE — Telephone Encounter (Signed)
PT SET UP A FOLLOW UP VISIT WITH DR. Merla Riches ON 11/20 BUT IS IN NEED OF A REFILL OF HIS METFORMINE UNTIL THEN. HE USES [facility] ON [address]. PT'S # [id number]

## 2012-04-25 NOTE — Telephone Encounter (Signed)
Metformin sent to [facility]

## 2012-05-17 ENCOUNTER — Ambulatory Visit (INDEPENDENT_AMBULATORY_CARE_PROVIDER_SITE_OTHER): Payer: 59 | Admitting: Internal Medicine

## 2012-05-17 ENCOUNTER — Encounter: Payer: Self-pay | Admitting: Internal Medicine

## 2012-05-17 VITALS — BP 110/90 | HR 79 | Temp 98.8°F | Resp 16 | Ht 73.0 in | Wt 283.0 lb

## 2012-05-17 DIAGNOSIS — G4730 Sleep apnea, unspecified: Secondary | ICD-10-CM | POA: Diagnosis not present

## 2012-05-17 DIAGNOSIS — Z6837 Body mass index (BMI) 37.0-37.9, adult: Secondary | ICD-10-CM | POA: Insufficient documentation

## 2012-05-17 DIAGNOSIS — L989 Disorder of the skin and subcutaneous tissue, unspecified: Secondary | ICD-10-CM | POA: Diagnosis not present

## 2012-05-17 DIAGNOSIS — I10 Essential (primary) hypertension: Secondary | ICD-10-CM

## 2012-05-17 DIAGNOSIS — IMO0001 Reserved for inherently not codable concepts without codable children: Secondary | ICD-10-CM

## 2012-05-17 DIAGNOSIS — Z23 Encounter for immunization: Secondary | ICD-10-CM

## 2012-05-17 DIAGNOSIS — E119 Type 2 diabetes mellitus without complications: Secondary | ICD-10-CM

## 2012-05-17 DIAGNOSIS — E785 Hyperlipidemia, unspecified: Secondary | ICD-10-CM

## 2012-05-17 DIAGNOSIS — Z720 Tobacco use: Secondary | ICD-10-CM

## 2012-05-17 DIAGNOSIS — G8929 Other chronic pain: Secondary | ICD-10-CM

## 2012-05-17 DIAGNOSIS — J069 Acute upper respiratory infection, unspecified: Secondary | ICD-10-CM

## 2012-05-17 MED ORDER — GLUCOSE BLOOD VI STRP: ORAL_STRIP | Status: DC

## 2012-05-17 MED ORDER — LEVOTHYROXINE SODIUM 100 MCG PO TABS: 100.0000 ug | ORAL_TABLET | Freq: Every day | ORAL | Status: DC

## 2012-05-17 MED ORDER — METFORMIN HCL 500 MG PO TABS: 500.0000 mg | ORAL_TABLET | Freq: Two times a day (BID) | ORAL | Status: DC

## 2012-05-17 NOTE — Progress Notes (Signed)
Subjective:    Patient ID: Colin Ramirez, male    DOB: 1966/03/17, 46 y.o.   MRN: [id number]  HPIhere for f/u Patient Active Problem List  Diagnosis  . Diabetes mellitus--has not been taking medicines consistently over the last 5 or 6 months /restarted metformin recently /onglyza started by prior doctors initial medication -extensive /no sugar testing /  . HTN (hypertension)  . Hyperlipidemia--  . GERD (gastroesophageal reflux disease)  . Sleep apnea  . Obesity, Class II, BMI 35-39.9, with comorbidity  . Degenerative disc disease  . Tobacco user-unwilling or unable to quit at this point   . Chronic pain-in treatment -(  . BMI 37.0-37.9,adult/has managed to lose 30 pounds in the last year     -  Allergic rhinitis   -  Hypothyroid  Prior to Admission medications   Medication Sig Start Date End Date Taking? Authorizing Provider  amLODipine (NORVASC) 5 MG tablet Take 1 tablet (5 mg total) by mouth daily. 11/08/11  Yes Peyton Najjar, MD  atorvastatin (LIPITOR) 20 MG tablet Take 1 tablet (20 mg total) by mouth daily. 11/08/11  Yes Peyton Najjar, MD  diazepam (VALIUM) 5 MG tablet Take 5 mg by mouth every 6 (six) hours as needed.   Yes Historical Provider, MD  DULoxetine (CYMBALTA) 60 MG capsule Take 60 mg by mouth daily.     Yes Historical Provider, MD  esomeprazole (NEXIUM) 40 MG capsule Take 1 capsule (40 mg total) by mouth daily before breakfast. 11/08/11  Yes Peyton Najjar, MD  glucose blood test strip once in AM only 05/17/12 05/17/13 Yes Tonye Pearson, MD  levothyroxine (SYNTHROID, LEVOTHROID) 100 MCG tablet Take 1 tablet (100 mcg total) by mouth daily. 05/17/12 05/17/13 Yes Tonye Pearson, MD  metFORMIN (GLUCOPHAGE) 500 MG tablet Take 1 tablet (500 mg total) by mouth 2 (two) times daily with a meal. 05/17/12  Yes Tonye Pearson, MD  ramipril (ALTACE) 10 MG capsule Take 1 capsule (10 mg total) by mouth daily. 11/08/11  Yes Peyton Najjar, MD  albuterol (PROVENTIL  HFA;VENTOLIN HFA) 108 (90 BASE) MCG/ACT inhaler Inhale 2 puffs into the lungs every 6 (six) hours as needed for wheezing. 11/16/11 11/15/12  Elvina Sidle, MD  hydrochlorothiazide (HYDRODIURIL) 25 MG tablet Take 1 tablet (25 mg total) by mouth daily. 11/08/11   Peyton Najjar, MD  HYDROmorphone (DILAUDID) 4 MG tablet Take 4 mg by mouth every 4 (four) hours as needed.      Historical Provider, MD  HYDROmorphone HCl (EXALGO) 8 MG TB24 Take 12 mg by mouth 3 (three) times daily.     Historical Provider, MD  ipratropium (ATROVENT) 0.06 % nasal spray Place 2 sprays into the nose 4 (four) times daily. 11/08/11 11/07/12  Peyton Najjar, MD  oxymorphone (OPANA) 5 MG tablet Take 5 mg by mouth every 6 (six) hours as needed.    Historical Provider, MD  Compliance is an issue CPAP machine has been in use for 5 years and has been beneficial-first prescribed by Dr. Winfield Cunas is now dysfunctional in need of replacement Also has worrisome skin lesions  Review of Systems No vision changes or headaches No new fatigue No night sweats No chest pain or shortness of breath/no edema GERD is responding to Nexium History of saphenous vein thrombosis which is no longer an issue after anticoagulation    Objective:   Physical Exam Filed Vitals:   05/17/12 1331  BP: 110/90  Pulse: 79  Temp: 98.8 F (  37.1 C)  Resp: 16   283#  HEENT clear Heart regular without murmur Extremities with no sensory losses and no edema Skin-0.7 cm lesion left scalp at hairline that is run off with slightly raised edges/? Actinic keratosis/? Early basal cell  Multiple skin tags      40 minute office visit Assessment & Plan:   Patient Active Problem List  Diagnosis  . Diabetes mellitus  . HTN (hypertension)  . Hyperlipidemia  -  hypothyroid -meds increased in September   . Sleep apnea  . Obesity, Class II, BMI 35-39.9, with comorbidity  -  rule out basal cell carcinoma on scalp /multiple skin tags   . Tobacco user-will  need full attention a later visit      . BMI 37.0-37.9,adult  Needs to restart medicines and stick with them Meds ordered this encounter  Medications  . metFORMIN (GLUCOPHAGE) 500 MG tablet    Sig: Take 1 tablet (500 mg total) by mouth 2 (two) times daily with a meal.    Dispense:  180 tablet    Refill:  0  . levothyroxine (SYNTHROID, LEVOTHROID) 100 MCG tablet    Sig: Take 1 tablet (100 mcg total) by mouth daily.    Dispense:  90 tablet    Refill:  0  . glucose blood test strip    Sig: once in AM only    Dispense:  100 each    Refill:  0   diet !!!exercise!!! Continue weight loss Test blood sugar in the morning Followup in 3 months with full testing to see if medications need adjustment Letter to [facility] regarding replacing CPAP machine Referred to dermatology

## 2012-05-22 ENCOUNTER — Telehealth: Payer: Self-pay | Admitting: Radiology

## 2012-05-22 NOTE — Telephone Encounter (Signed)
Message copied by Caffie Damme on Mon May 22, 2012 11:34 AM ------      Message from: Tonye Pearson      Created: Wed May 17, 2012  6:30 PM       Call [facility] /tell them he is compliant and needs a new CPAP machine

## 2012-05-22 NOTE — Telephone Encounter (Signed)
See message from Britta Mccreedy, this has been faxed to [facility], they needed a face to face eval, so office visit is faxed to them, along with the previous order.

## 2012-05-29 DIAGNOSIS — D485 Neoplasm of uncertain behavior of skin: Secondary | ICD-10-CM | POA: Diagnosis not present

## 2012-05-29 DIAGNOSIS — L719 Rosacea, unspecified: Secondary | ICD-10-CM | POA: Diagnosis not present

## 2012-05-29 DIAGNOSIS — L821 Other seborrheic keratosis: Secondary | ICD-10-CM | POA: Diagnosis not present

## 2012-05-29 DIAGNOSIS — D239 Other benign neoplasm of skin, unspecified: Secondary | ICD-10-CM | POA: Diagnosis not present

## 2012-05-29 DIAGNOSIS — I781 Nevus, non-neoplastic: Secondary | ICD-10-CM | POA: Diagnosis not present

## 2012-05-29 DIAGNOSIS — L909 Atrophic disorder of skin, unspecified: Secondary | ICD-10-CM | POA: Diagnosis not present

## 2012-05-29 DIAGNOSIS — L919 Hypertrophic disorder of the skin, unspecified: Secondary | ICD-10-CM | POA: Diagnosis not present

## 2012-05-29 DIAGNOSIS — L570 Actinic keratosis: Secondary | ICD-10-CM | POA: Diagnosis not present

## 2012-05-29 DIAGNOSIS — Z808 Family history of malignant neoplasm of other organs or systems: Secondary | ICD-10-CM | POA: Diagnosis not present

## 2012-06-02 ENCOUNTER — Telehealth: Payer: Self-pay

## 2012-06-02 NOTE — Telephone Encounter (Signed)
Patient request rx for c pap machine. Cpac heated humidifier and supplies, pressure to remain the same. Fax to [facility] on [address]

## 2012-06-02 NOTE — Telephone Encounter (Signed)
Rx written.

## 2012-06-02 NOTE — Telephone Encounter (Signed)
Called pt and asked him what was still needed for CPAP machine - we had faxed order and OV notes on 11/25. Pt stated that he was told that they have notes, but no actual Rx.  Called [facility] and was told that they have the notes, but do need a Rx that reads "CPAP machine, heated humidifier, and supplies. Pressure to remain at 14 cm." Can we write a Rx for this?

## 2012-06-03 ENCOUNTER — Other Ambulatory Visit: Payer: Self-pay

## 2012-06-03 DIAGNOSIS — E785 Hyperlipidemia, unspecified: Secondary | ICD-10-CM

## 2012-06-03 DIAGNOSIS — I10 Essential (primary) hypertension: Secondary | ICD-10-CM

## 2012-06-03 DIAGNOSIS — E119 Type 2 diabetes mellitus without complications: Secondary | ICD-10-CM

## 2012-06-03 DIAGNOSIS — K219 Gastro-esophageal reflux disease without esophagitis: Secondary | ICD-10-CM

## 2012-06-03 MED ORDER — AMLODIPINE BESYLATE 5 MG PO TABS: 5.0000 mg | ORAL_TABLET | Freq: Every day | ORAL | Status: DC

## 2012-06-03 MED ORDER — ESOMEPRAZOLE MAGNESIUM 40 MG PO CPDR: 40.0000 mg | DELAYED_RELEASE_CAPSULE | Freq: Every day | ORAL | Status: DC

## 2012-06-03 MED ORDER — HYDROCHLOROTHIAZIDE 25 MG PO TABS: 25.0000 mg | ORAL_TABLET | Freq: Every day | ORAL | Status: DC

## 2012-06-03 NOTE — Telephone Encounter (Signed)
Can we send a 30 day supply to a local pharmacy and then send to mail order? Please advise

## 2012-06-03 NOTE — Telephone Encounter (Signed)
Pt's wife called stated pt is totally out of all BP meds, pt is out of Nexium.  Only a couple of his prescriptions were sent to  Mail order pharmacy. Wife would like a call to discuss faster way to get meds since totally out. Please call  [phone]

## 2012-06-03 NOTE — Telephone Encounter (Signed)
I have sent in a 30 day supply of his Norvasc, HCTZ, and Nexium to [facility].

## 2012-06-04 MED ORDER — LEVOTHYROXINE SODIUM 100 MCG PO TABS: 100.0000 ug | ORAL_TABLET | Freq: Every day | ORAL | Status: DC

## 2012-06-04 MED ORDER — AMLODIPINE BESYLATE 5 MG PO TABS: 5.0000 mg | ORAL_TABLET | Freq: Every day | ORAL | Status: DC

## 2012-06-04 MED ORDER — METFORMIN HCL 500 MG PO TABS: 500.0000 mg | ORAL_TABLET | Freq: Two times a day (BID) | ORAL | Status: DC

## 2012-06-04 MED ORDER — HYDROCHLOROTHIAZIDE 25 MG PO TABS: 25.0000 mg | ORAL_TABLET | Freq: Every day | ORAL | Status: DC

## 2012-06-04 MED ORDER — ESOMEPRAZOLE MAGNESIUM 40 MG PO CPDR: 40.0000 mg | DELAYED_RELEASE_CAPSULE | Freq: Every day | ORAL | Status: DC

## 2012-06-04 MED ORDER — ATORVASTATIN CALCIUM 20 MG PO TABS: 20.0000 mg | ORAL_TABLET | Freq: Every day | ORAL | Status: DC

## 2012-06-04 MED ORDER — RAMIPRIL 10 MG PO CAPS: 10.0000 mg | ORAL_CAPSULE | Freq: Every day | ORAL | Status: DC

## 2012-06-04 MED ORDER — GLUCOSE BLOOD VI STRP: ORAL_STRIP | Status: DC

## 2012-06-04 NOTE — Telephone Encounter (Signed)
Done, wife notified.

## 2012-06-04 NOTE — Telephone Encounter (Signed)
Rx in black bin, just waiting to call on Monday for fax number.

## 2012-06-04 NOTE — Telephone Encounter (Signed)
LMOM to CB. 

## 2012-06-04 NOTE — Telephone Encounter (Signed)
Spoke with pt he needs ramapril 10mg , amlodipine 5mg , Nexium 40 mg, HCTZ 25 mg, and atorvastatin 40 mg. They needs 90 day supply sent to [facility] [address].[name] [facility] is the wrong pharmacy. He was here for his check up with Dr Merla Riches on 05/17/2012. They would also like his Synthroid, Metformin, and test strips sent to [facility] [address]. I misunderstood the first request. Please advise

## 2012-06-06 ENCOUNTER — Ambulatory Visit (INDEPENDENT_AMBULATORY_CARE_PROVIDER_SITE_OTHER): Payer: 59 | Admitting: Family Medicine

## 2012-06-06 VITALS — BP 119/75 | HR 77 | Temp 98.4°F | Resp 20 | Ht 73.5 in | Wt 284.0 lb

## 2012-06-06 DIAGNOSIS — J40 Bronchitis, not specified as acute or chronic: Secondary | ICD-10-CM

## 2012-06-06 MED ORDER — AZITHROMYCIN 250 MG PO TABS: ORAL_TABLET | ORAL | Status: DC

## 2012-06-06 MED ORDER — HYDROCOD POLST-CHLORPHEN POLST 10-8 MG/5ML PO LQCR: 5.0000 mL | Freq: Two times a day (BID) | ORAL | Status: DC | PRN

## 2012-06-06 MED ORDER — ALBUTEROL SULFATE HFA 108 (90 BASE) MCG/ACT IN AERS: 2.0000 | INHALATION_SPRAY | Freq: Four times a day (QID) | RESPIRATORY_TRACT | Status: DC | PRN

## 2012-06-06 NOTE — Progress Notes (Signed)
This 46 year old man with diabetes and sleep apnea comes in with a cough, progressive and productive over the last 5 days. Said no fever. Patient was received a flu shot several weeks ago.  Patient is had bronchitis before he notes that he's started to wheeze again. He's familiar with the use of a inhaler. Said no hemoptysis and his throat is minimally scratchy. His nose is congested however.  Objective: Purulent discharge in both nasal passages Oropharynx mildly erythematous no exudates or swelling Neck: Supple no adenopathy Chest: Faint wheezes bilaterally Heart: Regular no murmur Skin: Plethoric in the face otherwise no rash Gait: Normal  Assessment: Acute bronchitis  Plan: 1. Bronchitis  azithromycin (ZITHROMAX Z-PAK) 250 MG tablet, chlorpheniramine-HYDROcodone (TUSSIONEX PENNKINETIC ER) 10-8 MG/5ML LQCR, albuterol (PROVENTIL HFA;VENTOLIN HFA) 108 (90 BASE) MCG/ACT inhaler

## 2012-06-06 NOTE — Telephone Encounter (Signed)
Called [facility] and got their fax #. Faxed in order w/confirmation.

## 2012-06-06 NOTE — Patient Instructions (Addendum)

## 2012-06-06 NOTE — Progress Notes (Signed)
46 yo gentleman presents with 4 days of nasal congestion.  Symptoms beginning last night: cough, wheezing, chest congestion.  Patient had flu shot 05/17/12  No inhaler presently.  previous use  Smoker 1PPD  CPAP: sleeping okay except for recently with cough  Plan: z-pak tussionex inhaler

## 2012-06-07 ENCOUNTER — Telehealth: Payer: Self-pay

## 2012-06-07 NOTE — Telephone Encounter (Signed)
Spoke with Millie at the supply center and she needs another RX for his CPAP machine because a PA signed the last one and the insurance company needs the initial doctor's signature. The RX should state Replacement CPAP machine, heated humidifier and supplies, Pressure to stay at 14, and include DX code [phone]. Please let me know when this is done so I can fax this to [facility].

## 2012-07-31 ENCOUNTER — Ambulatory Visit: Payer: 59

## 2012-07-31 ENCOUNTER — Ambulatory Visit (INDEPENDENT_AMBULATORY_CARE_PROVIDER_SITE_OTHER): Payer: 59 | Admitting: Family Medicine

## 2012-07-31 VITALS — BP 119/80 | HR 88 | Temp 98.0°F | Resp 20 | Ht 73.5 in | Wt 281.8 lb

## 2012-07-31 DIAGNOSIS — R05 Cough: Secondary | ICD-10-CM | POA: Diagnosis not present

## 2012-07-31 DIAGNOSIS — Z720 Tobacco use: Secondary | ICD-10-CM

## 2012-07-31 DIAGNOSIS — F17200 Nicotine dependence, unspecified, uncomplicated: Secondary | ICD-10-CM

## 2012-07-31 DIAGNOSIS — R059 Cough, unspecified: Secondary | ICD-10-CM

## 2012-07-31 DIAGNOSIS — J40 Bronchitis, not specified as acute or chronic: Secondary | ICD-10-CM

## 2012-07-31 MED ORDER — CEFDINIR 300 MG PO CAPS: 300.0000 mg | ORAL_CAPSULE | Freq: Two times a day (BID) | ORAL | Status: DC

## 2012-07-31 NOTE — Patient Instructions (Addendum)
Use the antibiotic as directed. If you are not better in the next couple of days please let us know.
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
## 2012-08-04 ENCOUNTER — Other Ambulatory Visit: Payer: Self-pay | Admitting: Radiology

## 2012-08-04 ENCOUNTER — Other Ambulatory Visit: Payer: Self-pay | Admitting: Physician Assistant

## 2012-08-04 MED ORDER — LEVOFLOXACIN 500 MG PO TABS: 500.0000 mg | ORAL_TABLET | Freq: Every day | ORAL | Status: DC

## 2012-08-04 NOTE — Telephone Encounter (Signed)
Patient has had rash from [facility], advised new meds sent to his pharmacy. His rash is on his arms, not affecting anywhere else. He is advised to take Zyrtec during the day Benadryl at night. He was advised if the rash worsens, or if he develops any symptoms of his face or throat he is to come here immediately or go to the ER. He agrees to plan. He is advised never to take th Omnicef again.

## 2012-08-16 ENCOUNTER — Other Ambulatory Visit: Payer: Self-pay | Admitting: Internal Medicine

## 2012-08-16 ENCOUNTER — Ambulatory Visit (INDEPENDENT_AMBULATORY_CARE_PROVIDER_SITE_OTHER): Payer: 59 | Admitting: Internal Medicine

## 2012-08-16 VITALS — BP 125/88 | HR 79 | Temp 97.6°F | Resp 16 | Ht 74.0 in | Wt 280.0 lb

## 2012-08-16 DIAGNOSIS — I10 Essential (primary) hypertension: Secondary | ICD-10-CM

## 2012-08-16 DIAGNOSIS — Z129 Encounter for screening for malignant neoplasm, site unspecified: Secondary | ICD-10-CM | POA: Diagnosis not present

## 2012-08-16 DIAGNOSIS — E119 Type 2 diabetes mellitus without complications: Secondary | ICD-10-CM | POA: Diagnosis not present

## 2012-08-16 DIAGNOSIS — E785 Hyperlipidemia, unspecified: Secondary | ICD-10-CM

## 2012-08-16 DIAGNOSIS — Z6837 Body mass index (BMI) 37.0-37.9, adult: Secondary | ICD-10-CM | POA: Diagnosis not present

## 2012-08-16 DIAGNOSIS — K219 Gastro-esophageal reflux disease without esophagitis: Secondary | ICD-10-CM

## 2012-08-16 DIAGNOSIS — E039 Hypothyroidism, unspecified: Secondary | ICD-10-CM

## 2012-08-16 LAB — COMPREHENSIVE METABOLIC PANEL
ALT: 72 U/L — ABNORMAL HIGH (ref 0–53)
AST: 39 U/L — ABNORMAL HIGH (ref 0–37)
Albumin: 4.2 g/dL (ref 3.5–5.2)
BUN: 12 mg/dL (ref 6–23)
CO2: 29 mEq/L (ref 19–32)
Calcium: 9.8 mg/dL (ref 8.4–10.5)
Chloride: 99 mEq/L (ref 96–112)
Creat: 0.75 mg/dL (ref 0.50–1.35)
Potassium: 3.8 mEq/L (ref 3.5–5.3)

## 2012-08-16 LAB — CBC WITH DIFFERENTIAL/PLATELET
Basophils Absolute: 0.1 10*3/uL (ref 0.0–0.1)
Basophils Relative: 2 % — ABNORMAL HIGH (ref 0–1)
Lymphocytes Relative: 26 % (ref 12–46)
MCHC: 35.1 g/dL (ref 30.0–36.0)
Neutro Abs: 5.5 10*3/uL (ref 1.7–7.7)
Neutrophils Relative %: 64 % (ref 43–77)
Platelets: 272 10*3/uL (ref 150–400)
RDW: 15.3 % (ref 11.5–15.5)
WBC: 8.5 10*3/uL (ref 4.0–10.5)

## 2012-08-16 LAB — LIPID PANEL
HDL: 33 mg/dL — ABNORMAL LOW (ref >39)
LDL Cholesterol: 96 mg/dL (ref 0–99)
Triglycerides: 277 mg/dL — ABNORMAL HIGH (ref <150)

## 2012-08-16 LAB — POCT GLYCOSYLATED HEMOGLOBIN (HGB A1C): Hemoglobin A1C: 9.1

## 2012-08-16 MED ORDER — METFORMIN HCL 1000 MG PO TABS: 1000.0000 mg | ORAL_TABLET | Freq: Two times a day (BID) | ORAL | Status: DC

## 2012-08-16 NOTE — Progress Notes (Addendum)
Subjective:    Patient ID: Colin Ramirez, male    DOB: 1966-01-16, 47 y.o.   MRN: [id number]  HPI followup after recently restarting medicine Patient Active Problem List  Diagnosis  . Diabetes mellitus  . HTN (hypertension)  . Hyperlipidemia  . GERD (gastroesophageal reflux disease)  . Sleep apnea  . Obesity, Class II, BMI 35-39.9, with comorbidity  . Degenerative disc disease  . Tobacco user  . Chronic pain  . BMI 37.0-37.9,adult    -  hypothyroid   blood sugars at home 120 to 130 No physical complaints today Here to evaluate effects of restarting metformin  Review of Systems No chest pain or palpitations No peripheral paresthesias Pain complaints stable    Objective:   Physical Exam BP 125/88  Pulse 79  Temp(Src) 97.6 F (36.4 C)  Resp 16  Ht 6\' 2"  (1.88 m)  Wt 280 lb (127.007 kg)  BMI 35.93 kg/m2 No appreciable weight loss HEENT clear Heart regular   Results for orders placed in visit on 08/16/12  POCT GLYCOSYLATED HEMOGLOBIN (HGB A1C)      Result Value Range   Hemoglobin A1C 9.1         Assessment & Plan:  BMI 37.0-37.9,adult -continue weight loss Diabetes mellitus -increase metformin to 1000 twice a day GERD (gastroesophageal reflux disease)  HTN (hypertension)-metabolic profile  Hyperlipidemia - Plan: Lipid panel  Screening for malignant neoplasm - Plan: PSA  Followup 3 months  Check thyroid at followup   Results for orders placed in visit on 08/16/12  CBC WITH DIFFERENTIAL      Result Value Range   WBC 8.5  4.0 - 10.5 K/uL   RBC 5.46  4.22 - 5.81 MIL/uL   Hemoglobin 16.0  13.0 - 17.0 g/dL   HCT 56.2  13.0 - 86.5 %   MCV 83.5  78.0 - 100.0 fL   MCH 29.3  26.0 - 34.0 pg   MCHC 35.1  30.0 - 36.0 g/dL   RDW 78.4  69.6 - 29.5 %   Platelets 272  150 - 400 K/uL   Neutrophils Relative 64  43 - 77 %   Neutro Abs 5.5  1.7 - 7.7 K/uL   Lymphocytes Relative 26  12 - 46 %   Lymphs Abs 2.2  0.7 - 4.0 K/uL   Monocytes Relative 6  3 - 12 %    Monocytes Absolute 0.5  0.1 - 1.0 K/uL   Eosinophils Relative 2  0 - 5 %   Eosinophils Absolute 0.2  0.0 - 0.7 K/uL   Basophils Relative 2 (*) 0 - 1 %   Basophils Absolute 0.1  0.0 - 0.1 K/uL   Smear Review Criteria for review not met    LIPID PANEL      Result Value Range   Cholesterol 184  0 - 200 mg/dL   Triglycerides 284 (*) <150 mg/dL   HDL 33 (*) >13 mg/dL   Total CHOL/HDL Ratio 5.6     VLDL 55 (*) 0 - 40 mg/dL   LDL Cholesterol 96  0 - 99 mg/dL  MICROALBUMIN, URINE      Result Value Range   Microalb, Ur 1.91 (*) 0.00 - 1.89 mg/dL  COMPREHENSIVE METABOLIC PANEL      Result Value Range   Sodium 136  135 - 145 mEq/L   Potassium 3.8  3.5 - 5.3 mEq/L   Chloride 99  96 - 112 mEq/L   CO2 29  19 - 32 mEq/L  Glucose, Bld 292 (*) 70 - 99 mg/dL   BUN 12  6 - 23 mg/dL   Creat 1.61  0.96 - 0.45 mg/dL   Total Bilirubin 0.5  0.3 - 1.2 mg/dL   Alkaline Phosphatase 58  39 - 117 U/L   AST 39 (*) 0 - 37 U/L   ALT 72 (*) 0 - 53 U/L   Total Protein 6.7  6.0 - 8.3 g/dL   Albumin 4.2  3.5 - 5.2 g/dL   Calcium 9.8  8.4 - 40.9 mg/dL  PSA      Result Value Range   PSA 0.57  <=4.00 ng/mL  POCT GLYCOSYLATED HEMOGLOBIN (HGB A1C)      Result Value Range   Hemoglobin A1C 9.1     needs to increase medicines for hyperlipidemia/continue Altace

## 2012-08-19 ENCOUNTER — Encounter: Payer: Self-pay | Admitting: Internal Medicine

## 2012-08-19 DIAGNOSIS — E039 Hypothyroidism, unspecified: Secondary | ICD-10-CM | POA: Insufficient documentation

## 2012-08-19 NOTE — Addendum Note (Signed)
Addended by: Tonye Pearson on: 08/19/2012 12:35 AM   Modules accepted: Orders, Level of Service

## 2012-08-22 ENCOUNTER — Encounter: Payer: Self-pay | Admitting: Internal Medicine

## 2012-09-17 ENCOUNTER — Other Ambulatory Visit: Payer: Self-pay | Admitting: Physician Assistant

## 2012-12-14 ENCOUNTER — Telehealth: Payer: Self-pay

## 2012-12-14 DIAGNOSIS — E119 Type 2 diabetes mellitus without complications: Secondary | ICD-10-CM

## 2012-12-14 NOTE — Telephone Encounter (Signed)
Pt states he has contacted his pharmacy ([facility] on [address]) for refills on about 8 of his medications and hasnt had a response. States he is going to be out by Wednesday of next week, but they are leaving for the beach tomorrow. bp medications, diabetic medications and nexium  Please call pt bf

## 2012-12-15 MED ORDER — AMLODIPINE BESYLATE 5 MG PO TABS: ORAL_TABLET | ORAL | Status: DC

## 2012-12-15 MED ORDER — HYDROCHLOROTHIAZIDE 25 MG PO TABS: ORAL_TABLET | ORAL | Status: DC

## 2012-12-15 MED ORDER — METFORMIN HCL 1000 MG PO TABS: 1000.0000 mg | ORAL_TABLET | Freq: Two times a day (BID) | ORAL | Status: DC

## 2012-12-15 MED ORDER — ATORVASTATIN CALCIUM 20 MG PO TABS: ORAL_TABLET | ORAL | Status: DC

## 2012-12-15 MED ORDER — RAMIPRIL 10 MG PO CAPS: ORAL_CAPSULE | ORAL | Status: DC

## 2012-12-15 MED ORDER — LEVOTHYROXINE SODIUM 100 MCG PO TABS: 100.0000 ug | ORAL_TABLET | Freq: Every day | ORAL | Status: DC

## 2012-12-15 MED ORDER — ESOMEPRAZOLE MAGNESIUM 40 MG PO CPDR: DELAYED_RELEASE_CAPSULE | ORAL | Status: DC

## 2012-12-15 NOTE — Telephone Encounter (Signed)
Have not gotten anything

## 2012-12-15 NOTE — Telephone Encounter (Signed)
Spoke to him, they are renewed, he will follow up before they run out.

## 2013-03-25 ENCOUNTER — Ambulatory Visit (INDEPENDENT_AMBULATORY_CARE_PROVIDER_SITE_OTHER): Payer: 59 | Admitting: Family Medicine

## 2013-03-25 VITALS — BP 128/80 | HR 73 | Temp 98.4°F | Resp 18 | Ht 74.0 in | Wt 280.8 lb

## 2013-03-25 DIAGNOSIS — G8929 Other chronic pain: Secondary | ICD-10-CM

## 2013-03-25 DIAGNOSIS — K219 Gastro-esophageal reflux disease without esophagitis: Secondary | ICD-10-CM

## 2013-03-25 DIAGNOSIS — E119 Type 2 diabetes mellitus without complications: Secondary | ICD-10-CM

## 2013-03-25 DIAGNOSIS — E039 Hypothyroidism, unspecified: Secondary | ICD-10-CM

## 2013-03-25 DIAGNOSIS — E785 Hyperlipidemia, unspecified: Secondary | ICD-10-CM

## 2013-03-25 DIAGNOSIS — I10 Essential (primary) hypertension: Secondary | ICD-10-CM | POA: Diagnosis not present

## 2013-03-25 DIAGNOSIS — Z23 Encounter for immunization: Secondary | ICD-10-CM

## 2013-03-25 DIAGNOSIS — Z720 Tobacco use: Secondary | ICD-10-CM

## 2013-03-25 DIAGNOSIS — IMO0001 Reserved for inherently not codable concepts without codable children: Secondary | ICD-10-CM

## 2013-03-25 DIAGNOSIS — IMO0002 Reserved for concepts with insufficient information to code with codable children: Secondary | ICD-10-CM

## 2013-03-25 DIAGNOSIS — F17200 Nicotine dependence, unspecified, uncomplicated: Secondary | ICD-10-CM

## 2013-03-25 DIAGNOSIS — G4730 Sleep apnea, unspecified: Secondary | ICD-10-CM

## 2013-03-25 LAB — COMPREHENSIVE METABOLIC PANEL
Albumin: 4.2 g/dL (ref 3.5–5.2)
Alkaline Phosphatase: 44 U/L (ref 39–117)
BUN: 16 mg/dL (ref 6–23)
CO2: 30 mEq/L (ref 19–32)
Glucose, Bld: 147 mg/dL — ABNORMAL HIGH (ref 70–99)
Potassium: 4 mEq/L (ref 3.5–5.3)
Total Bilirubin: 0.5 mg/dL (ref 0.3–1.2)

## 2013-03-25 LAB — LIPID PANEL
Cholesterol: 143 mg/dL (ref 0–200)
HDL: 28 mg/dL — ABNORMAL LOW (ref >39)
LDL Cholesterol: 82 mg/dL (ref 0–99)
Triglycerides: 165 mg/dL — ABNORMAL HIGH (ref <150)

## 2013-03-25 MED ORDER — ATORVASTATIN CALCIUM 20 MG PO TABS: ORAL_TABLET | ORAL | Status: DC

## 2013-03-25 MED ORDER — LEVOTHYROXINE SODIUM 100 MCG PO TABS: 100.0000 ug | ORAL_TABLET | Freq: Every day | ORAL | Status: DC

## 2013-03-25 MED ORDER — GLUCOSE BLOOD VI STRP: ORAL_STRIP | Status: AC

## 2013-03-25 MED ORDER — ESOMEPRAZOLE MAGNESIUM 40 MG PO CPDR: DELAYED_RELEASE_CAPSULE | ORAL | Status: DC

## 2013-03-25 MED ORDER — METFORMIN HCL 1000 MG PO TABS: 1000.0000 mg | ORAL_TABLET | Freq: Two times a day (BID) | ORAL | Status: DC

## 2013-03-25 MED ORDER — RAMIPRIL 10 MG PO CAPS: ORAL_CAPSULE | ORAL | Status: DC

## 2013-03-25 MED ORDER — HYDROCHLOROTHIAZIDE 25 MG PO TABS: ORAL_TABLET | ORAL | Status: DC

## 2013-03-25 NOTE — Patient Instructions (Signed)
Continue to take same medications. Advise trying to creep back up on the metformin by adding one half in the morning, and then in 6 or 8 weeks up to 2 a day if tolerated. If not tolerated I would recommend taking 500 mg twice a day rather than 1000 mg once a day  Annual check of eyes  Exercise  Be serious with the weight loss  Think about whether you're willing to quit cigarettes  Recommend for a diabetic with your problems that you followup every 3-4 months, sooner if problems

## 2013-03-25 NOTE — Progress Notes (Signed)
Subjective: 47 year old man who is here for a routine visit. He's been doing fairly well and remaining stable. He checks his sugars every couple of days at home, and runs sometimes in the 140s and sometimes up significantly higher. He realizes that his biggest problem is eating too many starches. When he cooks he is able to control this, but when others cooks eats too much of these. He likes bread. He tries to walk a couple of miles a day. He goes to the pain clinic for management of his chronic pain from his multiple lumbar disc disease problem. He is pretty consistently taking his medications. He has diabetes, hypertension, reflux, hyperlipidemia, sleep apnea on CPAP, obesity, and chronic pain as noted. He has noticed some mild neuropathy symptoms in his toes, but when he walks it subsides. He does smoke approximately one pack serous today. He knows he needs to get rid of those and we discussed that again. He has no problems, [place] eye Dr. about every other year.  Meds system review of systems was really unremarkable except for the numbness of his toes when he is reclining  Objective: Pleasant alert oriented man in no major distress. TMs normal. Eyes PERRLA. Fundi appear benign. Throat clear. Neck supple without carotid bruits. Chest is clear to auscultation. Heart regular without murmurs. No ankle edema. He has good sensation in his feet with the filament test with the exception of mild absence of a small area of his tip of his right large toe.  Assessment: Type 2 diabetes Hypertension GE reflux Sleep apnea syndrome Chronic pain syndrome Diabetic peripheral neuropathy Hyperlipidemia Hypothyroid Tobacco abuse counsel Obesity   Plan: Check hemoglobin A1c, C. met, lipids  Results for orders placed in visit on 03/25/13  POCT GLYCOSYLATED HEMOGLOBIN (HGB A1C)      Result Value Range   Hemoglobin A1C 7.9     Continue same medications. He will try and go back up on his metformin. See  instructions.  Tobacco counseling given  Flu shot today

## 2013-03-26 ENCOUNTER — Encounter: Payer: Self-pay | Admitting: Family Medicine

## 2013-03-30 ENCOUNTER — Other Ambulatory Visit: Payer: Self-pay | Admitting: Radiology

## 2013-03-30 DIAGNOSIS — I10 Essential (primary) hypertension: Secondary | ICD-10-CM

## 2013-03-30 NOTE — Telephone Encounter (Signed)
Patient requesting 90 day Rxs but they have already been sent for 90day supply called him

## 2013-06-16 ENCOUNTER — Ambulatory Visit (INDEPENDENT_AMBULATORY_CARE_PROVIDER_SITE_OTHER): Payer: 59 | Admitting: Family Medicine

## 2013-06-16 VITALS — BP 132/98 | HR 70 | Temp 98.0°F | Resp 16 | Ht 73.5 in | Wt 286.0 lb

## 2013-06-16 DIAGNOSIS — J209 Acute bronchitis, unspecified: Secondary | ICD-10-CM

## 2013-06-16 MED ORDER — AZITHROMYCIN 250 MG PO TABS: ORAL_TABLET | ORAL | Status: DC
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
## 2013-06-16 NOTE — Patient Instructions (Signed)

## 2013-10-19 ENCOUNTER — Ambulatory Visit: Payer: 59

## 2013-10-19 ENCOUNTER — Ambulatory Visit (INDEPENDENT_AMBULATORY_CARE_PROVIDER_SITE_OTHER): Payer: 59 | Admitting: Family Medicine

## 2013-10-19 VITALS — BP 148/102

## 2013-10-19 DIAGNOSIS — E039 Hypothyroidism, unspecified: Secondary | ICD-10-CM

## 2013-10-19 DIAGNOSIS — IMO0001 Reserved for inherently not codable concepts without codable children: Secondary | ICD-10-CM

## 2013-10-19 DIAGNOSIS — E1165 Type 2 diabetes mellitus with hyperglycemia: Secondary | ICD-10-CM

## 2013-10-19 DIAGNOSIS — R079 Chest pain, unspecified: Secondary | ICD-10-CM

## 2013-10-19 DIAGNOSIS — I10 Essential (primary) hypertension: Secondary | ICD-10-CM

## 2013-10-19 LAB — POCT CBC
Granulocyte percent: 75.9 %G (ref 37–80)
HCT, POC: 51.7 % (ref 43.5–53.7)
Hemoglobin: 16.9 g/dL (ref 14.1–18.1)
LYMPH, POC: 2 (ref 0.6–3.4)
MCH: 29.5 pg (ref 27–31.2)
MCHC: 32.7 g/dL (ref 31.8–35.4)
MCV: 90.3 fL (ref 80–97)
MID (CBC): 0.5 (ref 0–0.9)
MPV: 8.3 fL (ref 0–99.8)
PLATELET COUNT, POC: 326 10*3/uL (ref 142–424)
POC Granulocyte: 7.9 — AB (ref 2–6.9)
POC LYMPH %: 19.2 % (ref 10–50)
POC MID %: 4.9 % (ref 0–12)
RBC: 5.72 M/uL (ref 4.69–6.13)
RDW, POC: 14.9 %
WBC: 10.4 10*3/uL — AB (ref 4.6–10.2)

## 2013-10-19 LAB — COMPREHENSIVE METABOLIC PANEL
ALT: 63 U/L — ABNORMAL HIGH (ref 0–53)
AST: 29 U/L (ref 0–37)
Albumin: 4.2 g/dL (ref 3.5–5.2)
Alkaline Phosphatase: 50 U/L (ref 39–117)
BUN: 15 mg/dL (ref 6–23)
CALCIUM: 9.4 mg/dL (ref 8.4–10.5)
CHLORIDE: 98 meq/L (ref 96–112)
CO2: 29 mEq/L (ref 19–32)
CREATININE: 0.74 mg/dL (ref 0.50–1.35)
Glucose, Bld: 150 mg/dL — ABNORMAL HIGH (ref 70–99)
POTASSIUM: 3.9 meq/L (ref 3.5–5.3)
Sodium: 140 mEq/L (ref 135–145)
Total Bilirubin: 0.6 mg/dL (ref 0.2–1.2)
Total Protein: 7 g/dL (ref 6.0–8.3)

## 2013-10-19 LAB — LIPID PANEL
CHOLESTEROL: 153 mg/dL (ref 0–200)
HDL: 33 mg/dL — ABNORMAL LOW (ref >39)
LDL CALC: 87 mg/dL (ref 0–99)
TRIGLYCERIDES: 163 mg/dL — AB (ref <150)
Total CHOL/HDL Ratio: 4.6 Ratio
VLDL: 33 mg/dL (ref 0–40)

## 2013-10-19 LAB — TSH: TSH: 4.775 u[IU]/mL — ABNORMAL HIGH (ref 0.350–4.500)

## 2013-10-19 LAB — POCT GLYCOSYLATED HEMOGLOBIN (HGB A1C): Hemoglobin A1C: 7.8

## 2013-10-19 MED ORDER — AMLODIPINE BESYLATE 5 MG PO TABS: 5.0000 mg | ORAL_TABLET | Freq: Every day | ORAL | Status: DC

## 2013-10-19 NOTE — Patient Instructions (Addendum)
Start Norvasc and follow up BP in 2-4 weeks.  Either my office or the cardiologist's office will call you about an appointment. If you have not heard about your appointment by noon on Monday, please call our office and inquire- [phone]  You need to consider stopping smoking immediately.  Please decrease your intake of bread, simple carbohydrates.
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
## 2013-10-19 NOTE — Progress Notes (Signed)
Xray read and patient discussed with Ms. Gessner. Agree with assessment and plan of care per her note.   

## 2013-10-23 DIAGNOSIS — R9431 Abnormal electrocardiogram [ECG] [EKG]: Secondary | ICD-10-CM | POA: Diagnosis not present

## 2013-10-23 DIAGNOSIS — R071 Chest pain on breathing: Secondary | ICD-10-CM | POA: Diagnosis not present

## 2013-10-23 DIAGNOSIS — I10 Essential (primary) hypertension: Secondary | ICD-10-CM | POA: Diagnosis not present

## 2013-10-23 DIAGNOSIS — R52 Pain, unspecified: Secondary | ICD-10-CM | POA: Diagnosis not present

## 2013-10-23 DIAGNOSIS — R079 Chest pain, unspecified: Secondary | ICD-10-CM | POA: Diagnosis not present

## 2013-10-30 ENCOUNTER — Encounter: Payer: Self-pay | Admitting: Cardiology

## 2013-10-30 ENCOUNTER — Ambulatory Visit (INDEPENDENT_AMBULATORY_CARE_PROVIDER_SITE_OTHER): Payer: 59 | Admitting: Cardiology

## 2013-10-30 VITALS — BP 144/96 | HR 91 | Ht 73.5 in | Wt 288.0 lb

## 2013-10-30 DIAGNOSIS — R079 Chest pain, unspecified: Secondary | ICD-10-CM | POA: Diagnosis not present

## 2013-10-30 DIAGNOSIS — I10 Essential (primary) hypertension: Secondary | ICD-10-CM | POA: Diagnosis not present

## 2013-10-30 MED ORDER — AMLODIPINE BESYLATE 10 MG PO TABS: 10.0000 mg | ORAL_TABLET | Freq: Every day | ORAL | Status: DC
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
## 2013-10-30 NOTE — Patient Instructions (Signed)
Your physician has recommended you make the following change in your medication:  1. Increase Amlodipine to 10 MG One tablet daily  Your physician recommends that you schedule a follow-up appointment in: One week as a nurse visit for BP check  Your physician recommends that you schedule a follow-up appointment in: One month with Dr Radford Pax

## 2013-11-07 ENCOUNTER — Ambulatory Visit (INDEPENDENT_AMBULATORY_CARE_PROVIDER_SITE_OTHER): Payer: 59 | Admitting: *Deleted

## 2013-11-07 VITALS — BP 130/88 | HR 88 | Wt 279.8 lb

## 2013-11-07 DIAGNOSIS — I10 Essential (primary) hypertension: Secondary | ICD-10-CM

## 2013-11-07 NOTE — Patient Instructions (Signed)
Continue same medications.  Follow up appointment with Dr. Radford Pax 6/23 at 8:30  Monitor BP at home.  Call if has any concerns or if BP elevated

## 2013-11-07 NOTE — Progress Notes (Signed)
1.) Reason for visit: BP check since increasing Amlodipine to 10 mg  2.) Name of MD requesting visit: Dr. Fransico Him,  3.) H&P: Hx of HTN, dyslipidemia and DM  4.) ROS related to problem: No c/o.  Tolerating increase of Amlodipine.  Has not taken BP at home although he does have a cuff.  States he will start taking a couple times a week.  Has f/u app w/Dr. Radford Pax 6/23.  Will call if has any problems.    5.) Assessment and plan per MD: Discuss w/Dr. Radford Pax Above data reveiwed and agree with assessment and plan as outlined above  Fransico Him, MD

## 2013-12-13 ENCOUNTER — Telehealth: Payer: Self-pay

## 2013-12-13 ENCOUNTER — Other Ambulatory Visit: Payer: Self-pay | Admitting: Family Medicine

## 2013-12-13 NOTE — Telephone Encounter (Signed)
Pt called inquiring about his prescription amLODipine (NORVASC) 10 MG tablet; wanting to have refills done. I advised him that he has three refills for this at the [id number] - [place], [place].

## 2013-12-13 NOTE — Telephone Encounter (Signed)
Pt called inquiring about his prescription amLODipine (NORVASC) 10 MG tablet; wanting to have refills done. I advised him that he has three refills for this at the [id number] - [place], [place].

## 2013-12-18 ENCOUNTER — Ambulatory Visit: Payer: 59 | Admitting: Cardiology

## 2014-03-28 ENCOUNTER — Other Ambulatory Visit: Payer: Self-pay | Admitting: Family Medicine

## 2014-04-08 ENCOUNTER — Ambulatory Visit (INDEPENDENT_AMBULATORY_CARE_PROVIDER_SITE_OTHER): Payer: 59 | Admitting: Family Medicine

## 2014-04-08 VITALS — BP 146/98 | HR 73 | Temp 98.2°F | Resp 20 | Ht 73.25 in | Wt 274.8 lb

## 2014-04-08 DIAGNOSIS — K219 Gastro-esophageal reflux disease without esophagitis: Secondary | ICD-10-CM

## 2014-04-08 DIAGNOSIS — R05 Cough: Secondary | ICD-10-CM

## 2014-04-08 DIAGNOSIS — E119 Type 2 diabetes mellitus without complications: Secondary | ICD-10-CM

## 2014-04-08 DIAGNOSIS — R059 Cough, unspecified: Secondary | ICD-10-CM

## 2014-04-08 DIAGNOSIS — E785 Hyperlipidemia, unspecified: Secondary | ICD-10-CM

## 2014-04-08 DIAGNOSIS — E038 Other specified hypothyroidism: Secondary | ICD-10-CM

## 2014-04-08 DIAGNOSIS — R0989 Other specified symptoms and signs involving the circulatory and respiratory systems: Secondary | ICD-10-CM

## 2014-04-08 DIAGNOSIS — F17210 Nicotine dependence, cigarettes, uncomplicated: Secondary | ICD-10-CM | POA: Diagnosis not present

## 2014-04-08 DIAGNOSIS — E034 Atrophy of thyroid (acquired): Secondary | ICD-10-CM

## 2014-04-08 DIAGNOSIS — I10 Essential (primary) hypertension: Secondary | ICD-10-CM | POA: Diagnosis not present

## 2014-04-08 DIAGNOSIS — R5383 Other fatigue: Secondary | ICD-10-CM | POA: Diagnosis not present

## 2014-04-08 LAB — POCT GLYCOSYLATED HEMOGLOBIN (HGB A1C): Hemoglobin A1C: 6.6

## 2014-04-08 MED ORDER — LEVOTHYROXINE SODIUM 100 MCG PO TABS: 100.0000 ug | ORAL_TABLET | Freq: Every day | ORAL | Status: DC

## 2014-04-08 MED ORDER — ATORVASTATIN CALCIUM 20 MG PO TABS: 20.0000 mg | ORAL_TABLET | Freq: Every day | ORAL | Status: DC

## 2014-04-08 MED ORDER — AMLODIPINE BESYLATE 10 MG PO TABS: 10.0000 mg | ORAL_TABLET | Freq: Every day | ORAL | Status: DC

## 2014-04-08 MED ORDER — ESOMEPRAZOLE MAGNESIUM 40 MG PO CPDR: 40.0000 mg | DELAYED_RELEASE_CAPSULE | Freq: Every day | ORAL | Status: DC

## 2014-04-08 MED ORDER — RAMIPRIL 10 MG PO CAPS: 10.0000 mg | ORAL_CAPSULE | Freq: Every day | ORAL | Status: DC

## 2014-04-08 MED ORDER — CEFTRIAXONE SODIUM 1 G IJ SOLR
1.0000 g | Freq: Once | INTRAMUSCULAR | Status: AC
Start: 2014-04-08 — End: 2014-04-08
  Administered 2014-04-08: 1 g via INTRAMUSCULAR

## 2014-04-08 MED ORDER — METFORMIN HCL 1000 MG PO TABS: 1000.0000 mg | ORAL_TABLET | Freq: Two times a day (BID) | ORAL | Status: DC

## 2014-04-08 MED ORDER — HYDROCHLOROTHIAZIDE 25 MG PO TABS: 25.0000 mg | ORAL_TABLET | Freq: Every day | ORAL | Status: DC

## 2014-04-08 MED ORDER — LEVOFLOXACIN 500 MG PO TABS: 500.0000 mg | ORAL_TABLET | Freq: Every day | ORAL | Status: DC
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
## 2014-04-08 NOTE — Patient Instructions (Signed)
Smoking Cessation Quitting smoking is important to your health and has many advantages. However, it is not always easy to quit since nicotine is a very addictive drug. Oftentimes, people try 3 times or more before being able to quit. This document explains the best ways for you to prepare to quit smoking. Quitting takes hard work and a lot of effort, but you can do it. ADVANTAGES OF QUITTING SMOKING  You will live longer, feel better, and live better.  Your body will feel the impact of quitting smoking almost immediately.  Within 20 minutes, blood pressure decreases. Your pulse returns to its normal level.  After 8 hours, carbon monoxide levels in the blood return to normal. Your oxygen level increases.  After 24 hours, the chance of having a heart attack starts to decrease. Your breath, hair, and body stop smelling like smoke.  After 48 hours, damaged nerve endings begin to recover. Your sense of taste and smell improve.  After 72 hours, the body is virtually free of nicotine. Your bronchial tubes relax and breathing becomes easier.  After 2 to 12 weeks, lungs can hold more air. Exercise becomes easier and circulation improves.  The risk of having a heart attack, stroke, cancer, or lung disease is greatly reduced.  After 1 year, the risk of coronary heart disease is cut in half.  After 5 years, the risk of stroke falls to the same as a nonsmoker.  After 10 years, the risk of lung cancer is cut in half and the risk of other cancers decreases significantly.  After 15 years, the risk of coronary heart disease drops, usually to the level of a nonsmoker.  If you are pregnant, quitting smoking will improve your chances of having a healthy baby.  The people you live with, especially any children, will be healthier.  You will have extra money to spend on things other than cigarettes. QUESTIONS TO THINK ABOUT BEFORE ATTEMPTING TO QUIT You may want to talk about your answers with your  health care provider.  Why do you want to quit?  If you tried to quit in the past, what helped and what did not?  What will be the most difficult situations for you after you quit? How will you plan to handle them?  Who can help you through the tough times? Your family? Friends? A health care provider?  What pleasures do you get from smoking? What ways can you still get pleasure if you quit? Here are some questions to ask your health care provider:  How can you help me to be successful at quitting?  What medicine do you think would be best for me and how should I take it?  What should I do if I need more help?  What is smoking withdrawal like? How can I get information on withdrawal? GET READY  Set a quit date.  Change your environment by getting rid of all cigarettes, ashtrays, matches, and lighters in your home, car, or work. Do not let people smoke in your home.  Review your past attempts to quit. Think about what worked and what did not. GET SUPPORT AND ENCOURAGEMENT You have a better chance of being successful if you have help. You can get support in many ways.  Tell your family, friends, and coworkers that you are going to quit and need their support. Ask them not to smoke around you.  Get individual, group, or telephone counseling and support. Programs are available at local hospitals and health centers. Call   your local health department for information about programs in your area. °· Spiritual beliefs and practices may help some smokers quit. °· Download a "quit meter" on your computer to keep track of quit statistics, such as how long you have gone without smoking, cigarettes not smoked, and money saved. °· Get a self-help book about quitting smoking and staying off tobacco. °LEARN NEW SKILLS AND BEHAVIORS °· Distract yourself from urges to smoke. Talk to someone, go for a walk, or occupy your time with a task. °· Change your normal routine. Take a different route to work.  Drink tea instead of coffee. Eat breakfast in a different place. °· Reduce your stress. Take a hot bath, exercise, or read a book. °· Plan something enjoyable to do every day. Reward yourself for not smoking. °· Explore interactive web-based programs that specialize in helping you quit. °GET MEDICINE AND USE IT CORRECTLY °Medicines can help you stop smoking and decrease the urge to smoke. Combining medicine with the above behavioral methods and support can greatly increase your chances of successfully quitting smoking. °· Nicotine replacement therapy helps deliver nicotine to your body without the negative effects and risks of smoking. Nicotine replacement therapy includes nicotine gum, lozenges, inhalers, nasal sprays, and skin patches. Some may be available over-the-counter and others require a prescription. °· Antidepressant medicine helps people abstain from smoking, but how this works is unknown. This medicine is available by prescription. °· Nicotinic receptor partial agonist medicine simulates the effect of nicotine in your brain. This medicine is available by prescription. °Ask your health care provider for advice about which medicines to use and how to use them based on your health history. Your health care provider will tell you what side effects to look out for if you choose to be on a medicine or therapy. Carefully read the information on the package. Do not use any other product containing nicotine while using a nicotine replacement product.  °RELAPSE OR DIFFICULT SITUATIONS °Most relapses occur within the first 3 months after quitting. Do not be discouraged if you start smoking again. Remember, most people try several times before finally quitting. You may have symptoms of withdrawal because your body is used to nicotine. You may crave cigarettes, be irritable, feel very hungry, cough often, get headaches, or have difficulty concentrating. The withdrawal symptoms are only temporary. They are strongest  when you first quit, but they will go away within 10-14 days. °To reduce the chances of relapse, try to: °· Avoid drinking alcohol. Drinking lowers your chances of successfully quitting. °· Reduce the amount of caffeine you consume. Once you quit smoking, the amount of caffeine in your body increases and can give you symptoms, such as a rapid heartbeat, sweating, and anxiety. °· Avoid smokers because they can make you want to smoke. °· Do not let weight gain distract you. Many smokers will gain weight when they quit, usually less than 10 pounds. Eat a healthy diet and stay active. You can always lose the weight gained after you quit. °· Find ways to improve your mood other than smoking. °FOR MORE INFORMATION  °www.smokefree.gov  °Document Released: 06/08/2001 Document Revised: 10/29/2013 Document Reviewed: 09/23/2011 °ExitCare® Patient Information ©2015 [employer]. This information is not intended to replace advice given to you by your health care provider. Make sure you discuss any questions you have with your health care provider. °Acute Bronchitis °Bronchitis is inflammation of the airways that extend from the windpipe into the lungs (bronchi). The inflammation often causes mucus   to develop. This leads to a cough, which is the most common symptom of bronchitis.  °In acute bronchitis, the condition usually develops suddenly and goes away over time, usually in a couple weeks. Smoking, allergies, and asthma can make bronchitis worse. Repeated episodes of bronchitis may cause further lung problems.  °CAUSES °Acute bronchitis is most often caused by the same virus that causes a cold. The virus can spread from person to person (contagious) through coughing, sneezing, and touching contaminated objects. °SIGNS AND SYMPTOMS  °· Cough.   °· Fever.   °· Coughing up mucus.   °· Body aches.   °· Chest congestion.   °· Chills.   °· Shortness of breath.   °· Sore throat.   °DIAGNOSIS  °Acute bronchitis is usually diagnosed  through a physical exam. Your health care provider will also ask you questions about your medical history. Tests, such as chest X-rays, are sometimes done to rule out other conditions.  °TREATMENT  °Acute bronchitis usually goes away in a couple weeks. Oftentimes, no medical treatment is necessary. Medicines are sometimes given for relief of fever or cough. Antibiotic medicines are usually not needed but may be prescribed in certain situations. In some cases, an inhaler may be recommended to help reduce shortness of breath and control the cough. A cool mist vaporizer may also be used to help thin bronchial secretions and make it easier to clear the chest.  °HOME CARE INSTRUCTIONS °· Get plenty of rest.   °· Drink enough fluids to keep your urine clear or pale yellow (unless you have a medical condition that requires fluid restriction). Increasing fluids may help thin your respiratory secretions (sputum) and reduce chest congestion, and it will prevent dehydration.   °· Take medicines only as directed by your health care provider. °· If you were prescribed an antibiotic medicine, finish it all even if you start to feel better. °· Avoid smoking and secondhand smoke. Exposure to cigarette smoke or irritating chemicals will make bronchitis worse. If you are a smoker, consider using nicotine gum or skin patches to help control withdrawal symptoms. Quitting smoking will help your lungs heal faster.   °· Reduce the chances of another bout of acute bronchitis by washing your hands frequently, avoiding people with cold symptoms, and trying not to touch your hands to your mouth, nose, or eyes.   °· Keep all follow-up visits as directed by your health care provider.   °SEEK MEDICAL CARE IF: °Your symptoms do not improve after 1 week of treatment.  °SEEK IMMEDIATE MEDICAL CARE IF: °· You develop an increased fever or chills.   °· You have chest pain.   °· You have severe shortness of breath. °· You have bloody sputum.   °· You  develop dehydration. °· You faint or repeatedly feel like you are going to pass out. °· You develop repeated vomiting. °· You develop a severe headache. °MAKE SURE YOU:  °· Understand these instructions. °· Will watch your condition. °· Will get help right away if you are not doing well or get worse. °Document Released: 07/22/2004 Document Revised: 10/29/2013 Document Reviewed: 12/05/2012 °ExitCare® Patient Information ©2015 [employer]. This information is not intended to replace advice given to you by your health care provider. Make sure you discuss any questions you have with your health care provider. ° °

## 2014-04-09 LAB — LIPID PANEL
Cholesterol: 158 mg/dL (ref 0–200)
HDL: 34 mg/dL — ABNORMAL LOW (ref >39)
LDL Cholesterol: 78 mg/dL (ref 0–99)
Total CHOL/HDL Ratio: 4.6 Ratio
Triglycerides: 231 mg/dL — ABNORMAL HIGH (ref <150)
VLDL: 46 mg/dL — ABNORMAL HIGH (ref 0–40)

## 2014-04-09 LAB — COMPREHENSIVE METABOLIC PANEL
ALT: 64 U/L — ABNORMAL HIGH (ref 0–53)
AST: 33 U/L (ref 0–37)
Albumin: 4.2 g/dL (ref 3.5–5.2)
Alkaline Phosphatase: 46 U/L (ref 39–117)
BUN: 12 mg/dL (ref 6–23)
CO2: 27 mEq/L (ref 19–32)
Calcium: 9.7 mg/dL (ref 8.4–10.5)
Chloride: 102 mEq/L (ref 96–112)
Creat: 0.73 mg/dL (ref 0.50–1.35)
Glucose, Bld: 133 mg/dL — ABNORMAL HIGH (ref 70–99)
Potassium: 3.9 mEq/L (ref 3.5–5.3)
Sodium: 139 mEq/L (ref 135–145)
Total Bilirubin: 0.5 mg/dL (ref 0.2–1.2)
Total Protein: 6.9 g/dL (ref 6.0–8.3)

## 2014-04-09 LAB — TSH: TSH: 7.685 u[IU]/mL — ABNORMAL HIGH (ref 0.350–4.500)

## 2014-04-15 ENCOUNTER — Telehealth: Payer: Self-pay

## 2014-04-15 DIAGNOSIS — E034 Atrophy of thyroid (acquired): Secondary | ICD-10-CM

## 2014-04-15 DIAGNOSIS — G4733 Obstructive sleep apnea (adult) (pediatric): Secondary | ICD-10-CM

## 2014-04-15 DIAGNOSIS — I10 Essential (primary) hypertension: Secondary | ICD-10-CM

## 2014-04-15 DIAGNOSIS — E038 Other specified hypothyroidism: Secondary | ICD-10-CM

## 2014-04-15 DIAGNOSIS — Z9989 Dependence on other enabling machines and devices: Secondary | ICD-10-CM

## 2014-04-15 DIAGNOSIS — E785 Hyperlipidemia, unspecified: Secondary | ICD-10-CM

## 2014-04-15 DIAGNOSIS — E119 Type 2 diabetes mellitus without complications: Secondary | ICD-10-CM

## 2014-04-15 NOTE — Telephone Encounter (Signed)
Patient is requesting that we correct the amount we sent in for prescriptions. He normally gets 90 day supplies for all meds. Because he is on a fixed income with [facility] [address].  He only received 30 day supplies for: ramipril (ALTACE) 10 MG capsule levothyroxine (SYNTHROID, LEVOTHROID) 100 MCG tablet atorvastatin (LIPITOR) 20 MG tablet hydrochlorothiazide (HYDRODIURIL) 25 MG tablet Please correct or give a 60 day supply on these. He discussed with provider that these all would be 90 day and that there wouldn't be any problems.   Also patient is requesting a prescription for a cpap machine; he says he has had one for 6 years and it is worn out. He says [place] location. Is renting them out but require a prescription. Patient says he had a prescription two years ago but says he needs a new one for them.   Best: [phone]

## 2014-04-16 ENCOUNTER — Telehealth: Payer: Self-pay | Admitting: Family Medicine

## 2014-04-16 ENCOUNTER — Ambulatory Visit (INDEPENDENT_AMBULATORY_CARE_PROVIDER_SITE_OTHER): Payer: 59 | Admitting: Family Medicine

## 2014-04-16 VITALS — BP 124/78 | HR 87 | Temp 97.8°F | Resp 18 | Ht 73.5 in | Wt 273.4 lb

## 2014-04-16 DIAGNOSIS — J209 Acute bronchitis, unspecified: Secondary | ICD-10-CM

## 2014-04-16 MED ORDER — HYDROCHLOROTHIAZIDE 25 MG PO TABS: 25.0000 mg | ORAL_TABLET | Freq: Every day | ORAL | Status: DC

## 2014-04-16 MED ORDER — RAMIPRIL 10 MG PO CAPS: 10.0000 mg | ORAL_CAPSULE | Freq: Every day | ORAL | Status: DC

## 2014-04-16 MED ORDER — ATORVASTATIN CALCIUM 20 MG PO TABS: 20.0000 mg | ORAL_TABLET | Freq: Every day | ORAL | Status: DC

## 2014-04-16 MED ORDER — AMLODIPINE BESYLATE 10 MG PO TABS: 10.0000 mg | ORAL_TABLET | Freq: Every day | ORAL | Status: DC

## 2014-04-16 MED ORDER — AZITHROMYCIN 250 MG PO TABS: ORAL_TABLET | ORAL | Status: DC

## 2014-04-16 MED ORDER — METFORMIN HCL 1000 MG PO TABS: 1000.0000 mg | ORAL_TABLET | Freq: Two times a day (BID) | ORAL | Status: DC

## 2014-04-16 MED ORDER — ALBUTEROL SULFATE HFA 108 (90 BASE) MCG/ACT IN AERS: 2.0000 | INHALATION_SPRAY | Freq: Four times a day (QID) | RESPIRATORY_TRACT | Status: DC | PRN

## 2014-04-16 MED ORDER — LEVOTHYROXINE SODIUM 100 MCG PO TABS: 100.0000 ug | ORAL_TABLET | Freq: Every day | ORAL | Status: DC

## 2014-04-16 NOTE — Telephone Encounter (Signed)
Faxed order to  [phone] Pt advised.

## 2014-04-16 NOTE — Addendum Note (Signed)
Addended by: Jethro Bolus A on: 04/16/2014 03:52 PM   Modules accepted: Orders
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
## 2014-04-16 NOTE — Patient Instructions (Signed)

## 2014-04-16 NOTE — Telephone Encounter (Signed)
Please advise on CPAP machine.  Sent refills to pharmacy as prescribed in 90 day supply at [place] 10/12 Tried to call pharmacy to advise ok to refill #90 and phone is down there.

## 2014-04-16 NOTE — Telephone Encounter (Signed)
Patient was told a copy of the form would be left for him. Unable to locate in pick up drawer, please advise.

## 2014-04-16 NOTE — Telephone Encounter (Signed)
Please help me call in supplies for CPAP machine renewal

## 2014-04-17 NOTE — Telephone Encounter (Signed)
Order for CPAP left in the pick up drawer- no forms were left. Order is still in pick up drawer in envelope.

## 2014-04-22 NOTE — Telephone Encounter (Signed)
Patient states pharmacy has two of his scripts at     Q 30 ,  They are written for 90 days.  levothyroxine (SYNTHROID, LEVOTHROID) 100 MCG tablet  hydrochlorothiazide (HYDRODIURIL) 25 MG tablet    [facility] [address]

## 2014-04-23 ENCOUNTER — Telehealth: Payer: Self-pay

## 2014-04-23 NOTE — Telephone Encounter (Signed)
RECEIVED A CALL FROM SHARON AT [place]. STATES DR Joseph Art REQUESTED A REPLACEMENT CPAP MACHINE. PER SHARON, THEY NEED THE MEDICAL NOTES TO SEND TO MEDICAL FOR PRIOR APPROVAL FOR THE REPLACEMENT. SHARON CAN BE REACHED AT [phone] AND HER FAX NUMBER IS [phone]

## 2014-04-24 ENCOUNTER — Ambulatory Visit (INDEPENDENT_AMBULATORY_CARE_PROVIDER_SITE_OTHER): Payer: 59 | Admitting: Family Medicine

## 2014-04-24 ENCOUNTER — Ambulatory Visit (INDEPENDENT_AMBULATORY_CARE_PROVIDER_SITE_OTHER): Payer: 59

## 2014-04-24 VITALS — BP 128/92 | HR 82 | Temp 97.9°F | Resp 18 | Wt 274.0 lb

## 2014-04-24 DIAGNOSIS — G4733 Obstructive sleep apnea (adult) (pediatric): Secondary | ICD-10-CM | POA: Diagnosis not present

## 2014-04-24 DIAGNOSIS — J209 Acute bronchitis, unspecified: Secondary | ICD-10-CM

## 2014-04-24 DIAGNOSIS — R05 Cough: Secondary | ICD-10-CM

## 2014-04-24 DIAGNOSIS — R059 Cough, unspecified: Secondary | ICD-10-CM

## 2014-04-24 LAB — POCT CBC
Granulocyte percent: 71.6 %G (ref 37–80)
HCT, POC: 49.7 % (ref 43.5–53.7)
Hemoglobin: 16.4 g/dL (ref 14.1–18.1)
Lymph, poc: 2.2 (ref 0.6–3.4)
MCH, POC: 29.7 pg (ref 27–31.2)
MCHC: 33 g/dL (ref 31.8–35.4)
MCV: 89.9 fL (ref 80–97)
MID (cbc): 0.7 (ref 0–0.9)
MPV: 7 fL (ref 0–99.8)
POC Granulocyte: 7.3 — AB (ref 2–6.9)
POC LYMPH PERCENT: 22 %L (ref 10–50)
POC MID %: 6.4 %M (ref 0–12)
Platelet Count, POC: 302 10*3/uL (ref 142–424)
RBC: 5.53 M/uL (ref 4.69–6.13)
RDW, POC: 14.6 %
WBC: 10.2 10*3/uL (ref 4.6–10.2)

## 2014-04-24 MED ORDER — LEVOFLOXACIN 500 MG PO TABS: 500.0000 mg | ORAL_TABLET | Freq: Every day | ORAL | Status: DC

## 2014-04-24 MED ORDER — PREDNISONE 20 MG PO TABS: ORAL_TABLET | ORAL | Status: DC

## 2014-04-24 NOTE — Telephone Encounter (Signed)
Spoke to pt- he is going to come in to get the office note written. He is aware Dr. Carlean Jews is here only until 2pm today.
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
## 2014-04-24 NOTE — Patient Instructions (Signed)

## 2014-04-24 NOTE — Telephone Encounter (Signed)
Patient came in office today and seen Dr. Joseph Art. Filled out form needed by [facility] faxed form and OV from 04/16/14.

## 2014-04-27 ENCOUNTER — Other Ambulatory Visit: Payer: Self-pay | Admitting: Family Medicine

## 2014-05-25 ENCOUNTER — Ambulatory Visit (INDEPENDENT_AMBULATORY_CARE_PROVIDER_SITE_OTHER): Payer: 59

## 2014-05-25 ENCOUNTER — Ambulatory Visit (INDEPENDENT_AMBULATORY_CARE_PROVIDER_SITE_OTHER): Payer: 59 | Admitting: Emergency Medicine

## 2014-05-25 VITALS — BP 110/78 | HR 78 | Temp 97.8°F | Resp 20 | Ht 73.0 in | Wt 276.0 lb

## 2014-05-25 DIAGNOSIS — R059 Cough, unspecified: Secondary | ICD-10-CM

## 2014-05-25 DIAGNOSIS — R0981 Nasal congestion: Secondary | ICD-10-CM

## 2014-05-25 DIAGNOSIS — J309 Allergic rhinitis, unspecified: Secondary | ICD-10-CM | POA: Diagnosis not present

## 2014-05-25 DIAGNOSIS — R51 Headache: Secondary | ICD-10-CM

## 2014-05-25 DIAGNOSIS — R05 Cough: Secondary | ICD-10-CM

## 2014-05-25 DIAGNOSIS — R519 Headache, unspecified: Secondary | ICD-10-CM

## 2014-05-25 MED ORDER — BUDESONIDE-FORMOTEROL FUMARATE 160-4.5 MCG/ACT IN AERO: 2.0000 | INHALATION_SPRAY | Freq: Two times a day (BID) | RESPIRATORY_TRACT | Status: DC
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
## 2014-05-25 NOTE — Patient Instructions (Signed)
Allergic Rhinitis °Allergic rhinitis is when the mucous membranes in the nose respond to allergens. Allergens are particles in the air that cause your body to have an allergic reaction. This causes you to release allergic antibodies. Through a chain of events, these eventually cause you to release histamine into the blood stream. Although meant to protect the body, it is this release of histamine that causes your discomfort, such as frequent sneezing, congestion, and an itchy, runny nose.  °CAUSES  °Seasonal allergic rhinitis (hay fever) is caused by pollen allergens that may come from grasses, trees, and weeds. Year-round allergic rhinitis (perennial allergic rhinitis) is caused by allergens such as house dust mites, pet dander, and mold spores.  °SYMPTOMS  °· Nasal stuffiness (congestion). °· Itchy, runny nose with sneezing and tearing of the eyes. °DIAGNOSIS  °Your health care provider can help you determine the allergen or allergens that trigger your symptoms. If you and your health care provider are unable to determine the allergen, skin or blood testing may be used. °TREATMENT  °Allergic rhinitis does not have a cure, but it can be controlled by: °· Medicines and allergy shots (immunotherapy). °· Avoiding the allergen. °Hay fever may often be treated with antihistamines in pill or nasal spray forms. Antihistamines block the effects of histamine. There are over-the-counter medicines that may help with nasal congestion and swelling around the eyes. Check with your health care provider before taking or giving this medicine.  °If avoiding the allergen or the medicine prescribed do not work, there are many new medicines your health care provider can prescribe. Stronger medicine may be used if initial measures are ineffective. Desensitizing injections can be used if medicine and avoidance does not work. Desensitization is when a patient is given ongoing shots until the body becomes less sensitive to the allergen.  Make sure you follow up with your health care provider if problems continue. °HOME CARE INSTRUCTIONS °It is not possible to completely avoid allergens, but you can reduce your symptoms by taking steps to limit your exposure to them. It helps to know exactly what you are allergic to so that you can avoid your specific triggers. °SEEK MEDICAL CARE IF:  °· You have a fever. °· You develop a cough that does not stop easily (persistent). °· You have shortness of breath. °· You start wheezing. °· Symptoms interfere with normal daily activities. °Document Released: 03/09/2001 Document Revised: 06/19/2013 Document Reviewed: 02/19/2013 °ExitCare® Patient Information ©2015 [employer]. This information is not intended to replace advice given to you by your health care provider. Make sure you discuss any questions you have with your health care provider. ° °Cough, Adult ° A cough is a reflex that helps clear your throat and airways. It can help heal the body or may be a reaction to an irritated airway. A cough may only last 2 or 3 weeks (acute) or may last more than 8 weeks (chronic).  °CAUSES °Acute cough: °· Viral or bacterial infections. °Chronic cough: °· Infections. °· Allergies. °· Asthma. °· Post-nasal drip. °· Smoking. °· Heartburn or acid reflux. °· Some medicines. °· Chronic lung problems (COPD). °· Cancer. °SYMPTOMS  °· Cough. °· Fever. °· Chest pain. °· Increased breathing rate. °· High-pitched whistling sound when breathing (wheezing). °· Colored mucus that you cough up (sputum). °TREATMENT  °· A bacterial cough may be treated with antibiotic medicine. °· A viral cough must run its course and will not respond to antibiotics. °· Your caregiver may recommend other treatments if you   have a chronic cough. °HOME CARE INSTRUCTIONS  °· Only take over-the-counter or prescription medicines for pain, discomfort, or fever as directed by your caregiver. Use cough suppressants only as directed by your caregiver. °· Use a  cold steam vaporizer or humidifier in your bedroom or home to help loosen secretions. °· Sleep in a semi-upright position if your cough is worse at night. °· Rest as needed. °· Stop smoking if you smoke. °SEEK IMMEDIATE MEDICAL CARE IF:  °· You have pus in your sputum. °· Your cough starts to worsen. °· You cannot control your cough with suppressants and are losing sleep. °· You begin coughing up blood. °· You have difficulty breathing. °· You develop pain which is getting worse or is uncontrolled with medicine. °· You have a fever. °MAKE SURE YOU:  °· Understand these instructions. °· Will watch your condition. °· Will get help right away if you are not doing well or get worse. °Document Released: 12/11/2010 Document Revised: 09/06/2011 Document Reviewed: 12/11/2010 °ExitCare® Patient Information ©2015 [employer]. This information is not intended to replace advice given to you by your health care provider. Make sure you discuss any questions you have with your health care provider. ° °

## 2014-06-06 ENCOUNTER — Encounter: Payer: Self-pay | Admitting: Family Medicine

## 2014-06-06 ENCOUNTER — Ambulatory Visit (INDEPENDENT_AMBULATORY_CARE_PROVIDER_SITE_OTHER): Payer: 59 | Admitting: Family Medicine

## 2014-06-06 VITALS — BP 120/80 | HR 82 | Temp 98.3°F | Resp 16 | Ht 73.5 in | Wt 272.0 lb

## 2014-06-06 DIAGNOSIS — E119 Type 2 diabetes mellitus without complications: Secondary | ICD-10-CM

## 2014-06-06 DIAGNOSIS — J209 Acute bronchitis, unspecified: Secondary | ICD-10-CM

## 2014-06-06 MED ORDER — AZITHROMYCIN 250 MG PO TABS: ORAL_TABLET | ORAL | Status: DC

## 2014-06-06 NOTE — Patient Instructions (Signed)
Use Afrin (oxymetazoline) just before bedtime
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
## 2014-06-07 LAB — MICROALBUMIN, URINE: Microalb, Ur: 2.5 mg/dL — ABNORMAL HIGH (ref <2.0)

## 2014-10-30 ENCOUNTER — Ambulatory Visit (INDEPENDENT_AMBULATORY_CARE_PROVIDER_SITE_OTHER): Payer: 59

## 2014-10-30 ENCOUNTER — Ambulatory Visit (INDEPENDENT_AMBULATORY_CARE_PROVIDER_SITE_OTHER): Payer: 59 | Admitting: Family Medicine

## 2014-10-30 VITALS — BP 122/82 | HR 82 | Temp 97.9°F | Resp 16 | Ht 73.5 in | Wt 280.0 lb

## 2014-10-30 DIAGNOSIS — M25552 Pain in left hip: Secondary | ICD-10-CM | POA: Diagnosis not present

## 2014-10-30 DIAGNOSIS — M1612 Unilateral primary osteoarthritis, left hip: Secondary | ICD-10-CM | POA: Diagnosis not present

## 2014-10-30 MED ORDER — ALBUTEROL SULFATE HFA 108 (90 BASE) MCG/ACT IN AERS: 2.0000 | INHALATION_SPRAY | RESPIRATORY_TRACT | Status: DC | PRN

## 2014-10-30 NOTE — Progress Notes (Deleted)
   Subjective:    Patient ID: Colin Ramirez, male    DOB: 17-Aug-1965, 49 y.o.   MRN: [id number]  HPI    Review of Systems     Objective:   Physical Exam        Assessment & Plan:

## 2014-10-30 NOTE — Progress Notes (Signed)
Colin Ramirez - 49 y.o. male MRN [id number]  Date of birth: 02-11-1966  SUBJECTIVE:  Including CC & ROS.  Patient is a 49 year old presenting with left hip pain that has been present for one month. Patient reports a long-standing history of lumbar back pain with known degenerative disc disease and arthritis changes throughout his lumbar spine. Currently under management pain management with [facility] chronic back pain. Reports over the past month he's had increasing lateral left hip pain radiating to the groin as well as cramping down into his leg. He reports his pain is different than his chronic pain in his back. Denies any numbness or tingling sensations into his leg. Difficult for him to walk get up from a seated position or leg down for long periods of time. Patient has not been treating his pain with anything other than his chronic pain medication he is not taking any anti-inflammatories. Has not used any heat or ice. Describes the pain as mostly a dull ache with occasional sharp pain that catches his breath.   ROS:  Constitutional:  No fever, chills, or fatigue.  Respiratory:  No shortness of breath, cough, or wheezing Cardiovascular:  No palpitations, chest pain or syncope Gastrointestinal:  No nausea, no abdominal pain Review of systems otherwise negative except for what is stated in HPI  HISTORY: Past Medical, Surgical, Social, and Family History Reviewed & Updated per EMR. Pertinent Historical Findings include: Hypertension, hyperlipidemia, type 2 diabetes, tobacco abuse, lumbar degenerative disc disease, hypothyroidism, obesity  PHYSICAL EXAM:  VS: BP:122/82 mmHg  HR:82bpm  TEMP:97.9 F (36.6 C)(Oral)  RESP:95 %  HT:6' 1.5" (186.7 cm)   WT:280 lb (127.007 kg)  BMI:36.5 HIP EXAM:  General: well nourished Skin of LE: warm; dry, no rashes, lesions, ecchymosis or erythema. Vascular: Dorsal pedal and femoral pulses 2+ bilaterally Neurologically: Sensation to light touch lower  extremities equal and intact bilaterally.    Observation - no ecchymosis, edema, or hematoma present over the anterior, lateral, or posterior soft tissue surrounding the hip Palpation:  Mild tenderness to palpation of the anterior hip, no tenderness over the greater trochanter, no tenderness posteriorly. No lumbar paraspinal muscle tenderness. Positive logroll ROM: Decreased range of motion with severe pain with internal rotation. Decreased hip flexion Muscle strength: Mild decreased strength in hip flexion extension due to pain, decrease hip abduction and abduction due to pain. Antalgic gait due to pain.  [facility] reading (PRIMARY) by Dr. Ollen Barges 3 view of the hip/ pelvis: Moderate to severe degenerative joint disease of the hip with bone spurring and joint space loss.   ASSESSMENT & PLAN:  Impression: Initial visit for chronic nontraumatic primary degenerative joint disease moderate to severe of the left hip  Recommendations: -Discuss with patient in length options for treatment given that his hip arthritis on x-rays moderate to severe we could start with conservative management of injection therapy or oral medications. -We discussed the merits of oral prednisone to calm down inflammation however patient would not like to do this given his history of diabetes and increasing blood sugars. -Also discussed the merits of intra-articular hip injection. Patient was more agreeable this plan. Also refer patient to orthopedic surgery so that he has this referral set up. Advised patient if symptoms do improve after hip injection he can cancel this appointment. However symptoms do not improve and returned quickly further evaluation and recommendations by surgery would be the next step. -Discuss the merits of medication control his pain. Patient is not interested in making any  changes to his pain medication and not use any muscle relaxants time. Agreed with this plan given that he is in pain management they  would have to make these adjustments.

## 2014-10-31 NOTE — Progress Notes (Signed)
Xray read and patient discussed with Dr. Ollen Barges. Agree with assessment and plan of care per her note.

## 2014-11-30 ENCOUNTER — Other Ambulatory Visit: Payer: Self-pay | Admitting: Family Medicine

## 2014-12-25 ENCOUNTER — Other Ambulatory Visit (HOSPITAL_COMMUNITY): Payer: Self-pay | Admitting: Interventional Radiology

## 2014-12-25 ENCOUNTER — Other Ambulatory Visit: Payer: Self-pay

## 2014-12-25 DIAGNOSIS — M25552 Pain in left hip: Secondary | ICD-10-CM

## 2014-12-25 DIAGNOSIS — M1612 Unilateral primary osteoarthritis, left hip: Secondary | ICD-10-CM

## 2014-12-26 ENCOUNTER — Other Ambulatory Visit: Payer: Self-pay

## 2014-12-31 ENCOUNTER — Other Ambulatory Visit: Payer: Self-pay | Admitting: Family Medicine

## 2015-01-01 ENCOUNTER — Other Ambulatory Visit: Payer: Self-pay | Admitting: Family Medicine

## 2015-01-01 ENCOUNTER — Telehealth: Payer: Self-pay

## 2015-01-01 NOTE — Telephone Encounter (Signed)
I have tried to put this order in but I have never ordered this before. Can someone help?

## 2015-01-01 NOTE — Telephone Encounter (Signed)
Roberta at [facility] is requesting patients order for DG Fluoro Guide NDL PLC/BX/INJ/LOC to be change to injection only for hip pain. The order was put in back in may by Deanna Didiano as urgernt. I spoke with Jonelle Sidle and she stated she is not sure how to order this. She stated normally this is ordered by an Orthopaedic provider. I am not sure what to do on this referral. Please advise

## 2015-01-01 NOTE — Telephone Encounter (Signed)
I spoke to [place] at [facility] and she reviewed order and stated that the correct order is in for a hip injection of L hip. She will call the pt and schedule the procedure as soon as possible.

## 2015-01-07 ENCOUNTER — Ambulatory Visit
Admission: RE | Admit: 2015-01-07 | Discharge: 2015-01-07 | Disposition: A | Discharge status: Home / Self Care | Payer: 59 | Source: Ambulatory Visit | Attending: Sports Medicine | Admitting: Sports Medicine

## 2015-01-07 DIAGNOSIS — M25552 Pain in left hip: Secondary | ICD-10-CM

## 2015-01-07 DIAGNOSIS — M1612 Unilateral primary osteoarthritis, left hip: Secondary | ICD-10-CM

## 2015-01-07 MED ORDER — IOHEXOL 180 MG/ML  SOLN: 1.0000 mL | Freq: Once | INTRAMUSCULAR | Status: AC | PRN

## 2015-01-07 MED ORDER — METHYLPREDNISOLONE ACETATE 40 MG/ML INJ SUSP (RADIOLOG: 120.0000 mg | Freq: Once | INTRAMUSCULAR | Status: DC

## 2015-03-28 ENCOUNTER — Other Ambulatory Visit: Payer: Self-pay | Admitting: Family Medicine

## 2015-03-30 ENCOUNTER — Other Ambulatory Visit: Payer: Self-pay | Admitting: Family Medicine

## 2015-04-25 ENCOUNTER — Other Ambulatory Visit: Payer: Self-pay | Admitting: Internal Medicine

## 2015-04-26 ENCOUNTER — Other Ambulatory Visit: Payer: Self-pay | Admitting: Family Medicine

## 2015-05-23 ENCOUNTER — Other Ambulatory Visit: Payer: Self-pay

## 2015-05-23 MED ORDER — ESOMEPRAZOLE MAGNESIUM 40 MG PO CPDR: 40.0000 mg | DELAYED_RELEASE_CAPSULE | Freq: Every day | ORAL | Status: DC

## 2015-05-30 ENCOUNTER — Other Ambulatory Visit: Payer: Self-pay | Admitting: Internal Medicine

## 2015-06-02 NOTE — Telephone Encounter (Signed)
Discussed need for f/up. Pt was aware and agreed to come into walk in, which is often easier for him. He stated that he has enough med for now and will need RFs on all of them when he RTC.

## 2015-06-10 ENCOUNTER — Other Ambulatory Visit: Payer: Self-pay | Admitting: Family Medicine

## 2015-07-08 ENCOUNTER — Ambulatory Visit (INDEPENDENT_AMBULATORY_CARE_PROVIDER_SITE_OTHER): Payer: 59 | Admitting: Family Medicine

## 2015-07-08 VITALS — BP 132/80 | HR 77 | Temp 97.9°F | Resp 20 | Ht 74.0 in | Wt 279.8 lb

## 2015-07-08 DIAGNOSIS — I10 Essential (primary) hypertension: Secondary | ICD-10-CM

## 2015-07-08 DIAGNOSIS — G4733 Obstructive sleep apnea (adult) (pediatric): Secondary | ICD-10-CM

## 2015-07-08 DIAGNOSIS — E785 Hyperlipidemia, unspecified: Secondary | ICD-10-CM | POA: Diagnosis not present

## 2015-07-08 DIAGNOSIS — E119 Type 2 diabetes mellitus without complications: Secondary | ICD-10-CM

## 2015-07-08 DIAGNOSIS — K219 Gastro-esophageal reflux disease without esophagitis: Secondary | ICD-10-CM

## 2015-07-08 DIAGNOSIS — M25552 Pain in left hip: Secondary | ICD-10-CM

## 2015-07-08 DIAGNOSIS — E039 Hypothyroidism, unspecified: Secondary | ICD-10-CM

## 2015-07-08 DIAGNOSIS — E663 Overweight: Secondary | ICD-10-CM | POA: Diagnosis not present

## 2015-07-08 DIAGNOSIS — Z720 Tobacco use: Secondary | ICD-10-CM | POA: Diagnosis not present

## 2015-07-08 LAB — LIPID PANEL
Cholesterol: 165 mg/dL (ref 125–200)
HDL: 34 mg/dL — ABNORMAL LOW (ref >=40)
LDL CALC: 93 mg/dL (ref <130)
Total CHOL/HDL Ratio: 4.9 Ratio (ref <=5.0)
Triglycerides: 190 mg/dL — ABNORMAL HIGH (ref <150)
VLDL: 38 mg/dL — ABNORMAL HIGH (ref <30)

## 2015-07-08 LAB — COMPLETE METABOLIC PANEL WITH GFR
ALBUMIN: 4.6 g/dL (ref 3.6–5.1)
ALK PHOS: 64 U/L (ref 40–115)
ALT: 58 U/L — ABNORMAL HIGH (ref 9–46)
AST: 30 U/L (ref 10–35)
BILIRUBIN TOTAL: 0.4 mg/dL (ref 0.2–1.2)
BUN: 16 mg/dL (ref 7–25)
CO2: 27 mmol/L (ref 20–31)
Calcium: 9.5 mg/dL (ref 8.6–10.3)
Chloride: 102 mmol/L (ref 98–110)
Creat: 0.67 mg/dL — ABNORMAL LOW (ref 0.70–1.33)
GFR, Est African American: >89 mL/min (ref >=60)
GFR, Est Non African American: >89 mL/min (ref >=60)
GLUCOSE: 163 mg/dL — AB (ref 65–99)
Potassium: 3.8 mmol/L (ref 3.5–5.3)
Sodium: 140 mmol/L (ref 135–146)
TOTAL PROTEIN: 7.4 g/dL (ref 6.1–8.1)

## 2015-07-08 LAB — TSH: TSH: 4.83 u[IU]/mL — ABNORMAL HIGH (ref 0.350–4.500)

## 2015-07-08 LAB — POCT GLYCOSYLATED HEMOGLOBIN (HGB A1C): Hemoglobin A1C: 8

## 2015-07-08 LAB — GLUCOSE, POCT (MANUAL RESULT ENTRY): POC Glucose: 175 mg/dl — AB (ref 70–99)

## 2015-07-08 MED ORDER — ESOMEPRAZOLE MAGNESIUM 40 MG PO CPDR: 40.0000 mg | DELAYED_RELEASE_CAPSULE | Freq: Every day | ORAL | Status: DC

## 2015-07-08 MED ORDER — HYDROCHLOROTHIAZIDE 25 MG PO TABS: ORAL_TABLET | ORAL | Status: DC

## 2015-07-08 MED ORDER — AMLODIPINE BESYLATE 10 MG PO TABS: ORAL_TABLET | ORAL | Status: DC

## 2015-07-08 MED ORDER — ATORVASTATIN CALCIUM 20 MG PO TABS: ORAL_TABLET | ORAL | Status: DC

## 2015-07-08 MED ORDER — METFORMIN HCL 1000 MG PO TABS: ORAL_TABLET | ORAL | Status: DC

## 2015-07-08 MED ORDER — LEVOTHYROXINE SODIUM 100 MCG PO TABS: ORAL_TABLET | ORAL | Status: DC

## 2015-07-08 MED ORDER — RAMIPRIL 10 MG PO CAPS: ORAL_CAPSULE | ORAL | Status: DC
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
## 2015-07-08 NOTE — Patient Instructions (Addendum)
I referred you to ortho for your hip pain.  Work on portion control for diet to help with weight loss, and continue exercise most days of the week. Blood sugar not at goal, but should improve with diet change.  Return in 3 months and will add medication then if still high. Continue metformin same dose for now.  See hints for quitting smoking below. Let me know if you have difficulty in quitting, as medicines can sometimes help.  We can restart the hydrochlorothiazide, but monitor your blood pressures at home for control, and if low readings, lightheaded or dizzy - stop the HCTZ and recheck to discuss med regimen.  You should receive a call or letter about your lab results within the next week to 10 days. If cholesterol elevated, may need to repeat fasting for 8 hours.  If thyroid level not at goal, would change dose 1st before changing to "brand only".  Avoid trigger foods for reflux and use nexium only if needed Return to the clinic or go to the nearest emergency room if any of your symptoms worsen or new symptoms occur.    [facility] offers smoking cessation clinics. Registration is required. To register call [phone] or register online at [email or web address]  Smoking Cessation, Tips for Success If you are ready to quit smoking, congratulations! You have chosen to help yourself be healthier. Cigarettes bring nicotine, tar, carbon monoxide, and other irritants into your body. Your lungs, heart, and blood vessels will be able to work better without these poisons. There are many different ways to quit smoking. Nicotine gum, nicotine patches, a nicotine inhaler, or nicotine nasal spray can help with physical craving. Hypnosis, support groups, and medicines help break the habit of smoking. WHAT THINGS CAN I DO TO MAKE QUITTING EASIER?  Here are some tips to help you quit for good:  Pick a date when you will quit smoking completely. Tell all of your friends and family about your plan to quit on that  date.  Do not try to slowly cut down on the number of cigarettes you are smoking. Pick a quit date and quit smoking completely starting on that day.  Throw away all cigarettes.   Clean and remove all ashtrays from your home, work, and car.  On a card, write down your reasons for quitting. Carry the card with you and read it when you get the urge to smoke.  Cleanse your body of nicotine. Drink enough water and fluids to keep your urine clear or pale yellow. Do this after quitting to flush the nicotine from your body.  Learn to predict your moods. Do not let a bad situation be your excuse to have a cigarette. Some situations in your life might tempt you into wanting a cigarette.  Never have "just one" cigarette. It leads to wanting another and another. Remind yourself of your decision to quit.  Change habits associated with smoking. If you smoked while driving or when feeling stressed, try other activities to replace smoking. Stand up when drinking your coffee. Brush your teeth after eating. Sit in a different chair when you read the paper. Avoid alcohol while trying to quit, and try to drink fewer caffeinated beverages. Alcohol and caffeine may urge you to smoke.  Avoid foods and drinks that can trigger a desire to smoke, such as sugary or spicy foods and alcohol.  Ask people who smoke not to smoke around you.  Have something planned to do right after eating or having a cup  of coffee. For example, plan to take a walk or exercise.  Try a relaxation exercise to calm you down and decrease your stress. Remember, you may be tense and nervous for the first 2 weeks after you quit, but this will pass.  Find new activities to keep your hands busy. Play with a pen, coin, or rubber band. Doodle or draw things on paper.  Brush your teeth right after eating. This will help cut down on the craving for the taste of tobacco after meals. You can also try mouthwash.   Use oral substitutes in place of  cigarettes. Try using lemon drops, carrots, cinnamon sticks, or chewing gum. Keep them handy so they are available when you have the urge to smoke.  When you have the urge to smoke, try deep breathing.  Designate your home as a nonsmoking area.  If you are a heavy smoker, ask your health care provider about a prescription for nicotine chewing gum. It can ease your withdrawal from nicotine.  Reward yourself. Set aside the cigarette money you save and buy yourself something nice.  Look for support from others. Join a support group or smoking cessation program. Ask someone at home or at work to help you with your plan to quit smoking.  Always ask yourself, "Do I need this cigarette or is this just a reflex?" Tell yourself, "Today, I choose not to smoke," or "I do not want to smoke." You are reminding yourself of your decision to quit.  Do not replace cigarette smoking with electronic cigarettes (commonly called e-cigarettes). The safety of e-cigarettes is unknown, and some may contain harmful chemicals.  If you relapse, do not give up! Plan ahead and think about what you will do the next time you get the urge to smoke. HOW WILL I FEEL WHEN I QUIT SMOKING? You may have symptoms of withdrawal because your body is used to nicotine (the addictive substance in cigarettes). You may crave cigarettes, be irritable, feel very hungry, cough often, get headaches, or have difficulty concentrating. The withdrawal symptoms are only temporary. They are strongest when you first quit but will go away within 10-14 days. When withdrawal symptoms occur, stay in control. Think about your reasons for quitting. Remind yourself that these are signs that your body is healing and getting used to being without cigarettes. Remember that withdrawal symptoms are easier to treat than the major diseases that smoking can cause.  Even after the withdrawal is over, expect periodic urges to smoke. However, these cravings are generally  short lived and will go away whether you smoke or not. Do not smoke! WHAT RESOURCES ARE AVAILABLE TO HELP ME QUIT SMOKING? Your health care provider can direct you to community resources or hospitals for support, which may include:  Group support.  Education.  Hypnosis.  Therapy.   This information is not intended to replace advice given to you by your health care provider. Make sure you discuss any questions you have with your health care provider.   Document Released: 03/12/2004 Document Revised: 07/05/2014 Document Reviewed: 11/30/2012 Elsevier Interactive Patient Education 2016 [place].  Diabetes and Standards of Medical Care Diabetes is complicated. You may find that your diabetes team includes a dietitian, nurse, diabetes educator, eye doctor, and more. To help everyone know what is going on and to help you get the care you deserve, the following schedule of care was developed to help keep you on track. Below are the tests, exams, vaccines, medicines, education, and plans you  will need. HbA1c test This test shows how well you have controlled your glucose over the past 2-3 months. It is used to see if your diabetes management plan needs to be adjusted.   It is performed at least 2 times a year if you are meeting treatment goals.  It is performed 4 times a year if therapy has changed or if you are not meeting treatment goals. Blood pressure test  This test is performed at every routine medical visit. The goal is less than 140/90 mm Hg for most people, but 130/80 mm Hg in some cases. Ask your health care provider about your goal. Dental exam  Follow up with the dentist regularly. Eye exam  If you are diagnosed with type 1 diabetes as a child, get an exam upon reaching the age of 26 years or older and having had diabetes for 3-5 years. Yearly eye exams are recommended after that initial eye exam.  If you are diagnosed with type 1 diabetes as an adult, get an exam within 5  years of diagnosis and then yearly.  If you are diagnosed with type 2 diabetes, get an exam as soon as possible after the diagnosis and then yearly. Foot care exam  Visual foot exams are performed at every routine medical visit. The exams check for cuts, injuries, or other problems with the feet.  You should have a complete foot exam performed every year. This exam includes an inspection of the structure and skin of your feet, a check of the pulses in your feet, and a check of the sensation in your feet.  Type 1 diabetes: The first exam is performed 5 years after diagnosis.  Type 2 diabetes: The first exam is performed at the time of diagnosis.  Check your feet nightly for cuts, injuries, or other problems with your feet. Tell your health care provider if anything is not healing. Kidney function test (urine microalbumin)  This test is performed once a year.  Type 1 diabetes: The first test is performed 5 years after diagnosis.  Type 2 diabetes: The first test is performed at the time of diagnosis.  A serum creatinine and estimated glomerular filtration rate (eGFR) test is done once a year to assess the level of chronic kidney disease (CKD), if present. Lipid profile (cholesterol, HDL, LDL, triglycerides)  Performed every 5 years for most people.  The goal for LDL is less than 100 mg/dL. If you are at high risk, the goal is less than 70 mg/dL.  The goal for HDL is 40 mg/dL-50 mg/dL for men and 50 mg/dL-60 mg/dL for women. An HDL cholesterol of 60 mg/dL or higher gives some protection against heart disease.  The goal for triglycerides is less than 150 mg/dL. Immunizations  The flu (influenza) vaccine is recommended yearly for every person 95 months of age or older who has diabetes.  The pneumonia (pneumococcal) vaccine is recommended for every person 29 years of age or older who has diabetes. Adults 49 years of age or older may receive the pneumonia vaccine as a series of two separate  shots.  The hepatitis B vaccine is recommended for adults shortly after they have been diagnosed with diabetes.  The Tdap (tetanus, diphtheria, and pertussis) vaccine should be given:  According to normal childhood vaccination schedules, for children.  Every 10 years, for adults who have diabetes. Diabetes self-management education  Education is recommended at diagnosis and ongoing as needed. Treatment plan  Your treatment plan is reviewed at every medical  visit.   This information is not intended to replace advice given to you by your health care provider. Make sure you discuss any questions you have with your health care provider.   Document Released: 04/11/2009 Document Revised: 07/05/2014 Document Reviewed: 11/14/2012 Elsevier Interactive Patient Education 2016 [place] for Gastroesophageal Reflux Disease, Adult When you have gastroesophageal reflux disease (GERD), the foods you eat and your eating habits are very important. Choosing the right foods can help ease the discomfort of GERD. WHAT GENERAL GUIDELINES DO I NEED TO FOLLOW?  Choose fruits, vegetables, whole grains, low-fat dairy products, and low-fat meat, fish, and poultry.  Limit fats such as oils, salad dressings, butter, nuts, and avocado.  Keep a food diary to identify foods that cause symptoms.  Avoid foods that cause reflux. These may be different for different people.  Eat frequent small meals instead of three large meals each day.  Eat your meals slowly, in a relaxed setting.  Limit fried foods.  Cook foods using methods other than frying.  Avoid drinking alcohol.  Avoid drinking large amounts of liquids with your meals.  Avoid bending over or lying down until 2-3 hours after eating. WHAT FOODS ARE NOT RECOMMENDED? The following are some foods and drinks that may worsen your symptoms: Vegetables Tomatoes. Tomato juice. Tomato and spaghetti sauce. Chili peppers. Onion and garlic.  Horseradish. Fruits Oranges, grapefruit, and lemon (fruit and juice). Meats High-fat meats, fish, and poultry. This includes hot dogs, ribs, ham, sausage, salami, and bacon. Dairy Whole milk and chocolate milk. Sour cream. Cream. Butter. Ice cream. Cream cheese.  Beverages Coffee and tea, with or without caffeine. Carbonated beverages or energy drinks. Condiments Hot sauce. Barbecue sauce.  Sweets/Desserts Chocolate and cocoa. Donuts. Peppermint and spearmint. Fats and Oils High-fat foods, including [place] fries and potato chips. Other Vinegar. Strong spices, such as black pepper, white pepper, red pepper, cayenne, curry powder, cloves, ginger, and chili powder. The items listed above may not be a complete list of foods and beverages to avoid. Contact your dietitian for more information.   This information is not intended to replace advice given to you by your health care provider. Make sure you discuss any questions you have with your health care provider.   Document Released: 06/14/2005 Document Revised: 07/05/2014 Document Reviewed: 04/18/2013 Elsevier Interactive Patient Education [employer].

## 2015-07-09 LAB — MICROALBUMIN, URINE: MICROALB UR: 3.2 mg/dL

## 2015-07-27 ENCOUNTER — Telehealth: Payer: Self-pay | Admitting: *Deleted

## 2015-07-27 NOTE — Telephone Encounter (Signed)
Pt notified of results and he would like stay on same dose and return in 6 weeks.

## 2015-07-27 NOTE — Telephone Encounter (Signed)
-----   Message from Wendie Agreste, MD sent at 07/27/2015  3:17 PM EST ----- Call patient. Test for protein in the urine was stable.  Cholesterol overall stable, 1 liver test borderline elevated, but stable. TSH was borderline, but closer to normal than 1 year ago.  We have 2 options, can slightly increase the dose of the Synthroid, with recheck levels in 6 weeks, or continue the same dose, and check the TSH again in the next 6 weeks with circulating thyroid hormone to see if it is overall okay. Either one is an option. Let me know what he would like to do.

## 2015-10-18 ENCOUNTER — Other Ambulatory Visit: Payer: Self-pay | Admitting: Family Medicine

## 2016-01-07 ENCOUNTER — Other Ambulatory Visit: Payer: Self-pay | Admitting: Family Medicine

## 2016-01-21 ENCOUNTER — Other Ambulatory Visit: Payer: Self-pay

## 2016-01-21 MED ORDER — ESOMEPRAZOLE MAGNESIUM 40 MG PO CPDR: DELAYED_RELEASE_CAPSULE | ORAL | 0 refills | Status: DC

## 2016-01-29 ENCOUNTER — Other Ambulatory Visit: Payer: Self-pay

## 2016-01-29 MED ORDER — ESOMEPRAZOLE MAGNESIUM 40 MG PO CPDR: DELAYED_RELEASE_CAPSULE | ORAL | 0 refills | Status: DC

## 2016-01-29 NOTE — Telephone Encounter (Signed)
Pharm sent notice that ins will only pay for a 90 day of esomeprazole. I called and they did cover 30 day supplies of all of the other maintenance drugs sent in on 7/13. Pharm reported that they would have written our note that pt needs OV on the bag if we put it in the comments, so pt should be aware. I will send this one Rx in for 90 day to cover pt until he can get in for RFs on his other meds.

## 2016-02-13 ENCOUNTER — Other Ambulatory Visit: Payer: Self-pay | Admitting: Emergency Medicine

## 2016-02-14 ENCOUNTER — Other Ambulatory Visit: Payer: Self-pay | Admitting: Emergency Medicine

## 2016-03-11 ENCOUNTER — Other Ambulatory Visit: Payer: Self-pay

## 2016-03-11 MED ORDER — METFORMIN HCL 1000 MG PO TABS: ORAL_TABLET | ORAL | 0 refills | Status: DC

## 2016-03-11 NOTE — Telephone Encounter (Signed)
Fax request for Metformin Hcl 1,000 tablet  Sig: Take 1 tablet by mouth 2 times a day with a meal  Pt has not been seen since 06/2015. Had one refill in August.  Note on refill for 30 days - states pt MUST be seen prior to further refills.

## 2016-03-11 NOTE — Telephone Encounter (Signed)
Spoke with pt and advised he needs follow up. Pt understood and stated all his meds were due for refill and he will come in tomorrow.   Advised pt of new appt scheduling procedure.

## 2016-04-01 ENCOUNTER — Ambulatory Visit (INDEPENDENT_AMBULATORY_CARE_PROVIDER_SITE_OTHER): Payer: 59

## 2016-04-01 ENCOUNTER — Ambulatory Visit (INDEPENDENT_AMBULATORY_CARE_PROVIDER_SITE_OTHER): Payer: 59 | Admitting: Physician Assistant

## 2016-04-01 ENCOUNTER — Ambulatory Visit: Payer: Medicare Other

## 2016-04-01 VITALS — BP 160/100 | HR 56 | Temp 98.6°F | Resp 17 | Ht 74.0 in | Wt 282.0 lb

## 2016-04-01 DIAGNOSIS — I10 Essential (primary) hypertension: Secondary | ICD-10-CM | POA: Diagnosis not present

## 2016-04-01 DIAGNOSIS — K219 Gastro-esophageal reflux disease without esophagitis: Secondary | ICD-10-CM | POA: Diagnosis not present

## 2016-04-01 DIAGNOSIS — E785 Hyperlipidemia, unspecified: Secondary | ICD-10-CM | POA: Diagnosis not present

## 2016-04-01 DIAGNOSIS — Z9189 Other specified personal risk factors, not elsewhere classified: Secondary | ICD-10-CM

## 2016-04-01 DIAGNOSIS — Z23 Encounter for immunization: Secondary | ICD-10-CM | POA: Diagnosis not present

## 2016-04-01 DIAGNOSIS — R053 Chronic cough: Secondary | ICD-10-CM

## 2016-04-01 DIAGNOSIS — Z720 Tobacco use: Secondary | ICD-10-CM

## 2016-04-01 DIAGNOSIS — R05 Cough: Secondary | ICD-10-CM

## 2016-04-01 DIAGNOSIS — E118 Type 2 diabetes mellitus with unspecified complications: Secondary | ICD-10-CM

## 2016-04-01 LAB — COMPLETE METABOLIC PANEL WITH GFR
ALBUMIN: 4.1 g/dL (ref 3.6–5.1)
ALK PHOS: 41 U/L (ref 40–115)
ALT: 43 U/L (ref 9–46)
AST: 27 U/L (ref 10–35)
BILIRUBIN TOTAL: 0.3 mg/dL (ref 0.2–1.2)
BUN: 13 mg/dL (ref 7–25)
CO2: 28 mmol/L (ref 20–31)
Calcium: 9.1 mg/dL (ref 8.6–10.3)
Chloride: 103 mmol/L (ref 98–110)
Creat: 0.75 mg/dL (ref 0.70–1.33)
GFR, Est African American: >89 mL/min (ref >=60)
GLUCOSE: 155 mg/dL — AB (ref 65–99)
Potassium: 4.4 mmol/L (ref 3.5–5.3)
SODIUM: 141 mmol/L (ref 135–146)
Total Protein: 6.4 g/dL (ref 6.1–8.1)

## 2016-04-01 LAB — CBC
HCT: 43.3 % (ref 38.5–50.0)
Hemoglobin: 14.7 g/dL (ref 13.2–17.1)
MCH: 29.8 pg (ref 27.0–33.0)
MCHC: 33.9 g/dL (ref 32.0–36.0)
MCV: 87.7 fL (ref 80.0–100.0)
MPV: 8.7 fL (ref 7.5–12.5)
Platelets: 213 10*3/uL (ref 140–400)
RBC: 4.94 MIL/uL (ref 4.20–5.80)
RDW: 14.2 % (ref 11.0–15.0)
WBC: 9.2 10*3/uL (ref 3.8–10.8)

## 2016-04-01 LAB — TSH: TSH: 12.44 mIU/L — ABNORMAL HIGH (ref 0.40–4.50)

## 2016-04-01 MED ORDER — LEVOTHYROXINE SODIUM 100 MCG PO TABS: ORAL_TABLET | ORAL | 1 refills | Status: DC

## 2016-04-01 MED ORDER — RAMIPRIL 10 MG PO CAPS: 10.0000 mg | ORAL_CAPSULE | Freq: Every day | ORAL | 0 refills | Status: DC

## 2016-04-01 MED ORDER — ATORVASTATIN CALCIUM 40 MG PO TABS: ORAL_TABLET | ORAL | 0 refills | Status: DC

## 2016-04-01 MED ORDER — CETIRIZINE HCL 10 MG PO TABS: 10.0000 mg | ORAL_TABLET | Freq: Every day | ORAL | 11 refills | Status: DC

## 2016-04-01 MED ORDER — METFORMIN HCL 1000 MG PO TABS: ORAL_TABLET | ORAL | 0 refills | Status: DC

## 2016-04-01 MED ORDER — HYDROCHLOROTHIAZIDE 25 MG PO TABS: 25.0000 mg | ORAL_TABLET | Freq: Every day | ORAL | 0 refills | Status: DC

## 2016-04-01 MED ORDER — AMLODIPINE BESYLATE 10 MG PO TABS
ORAL_TABLET | ORAL | 0 refills | Status: DC
Start: 2016-04-01 — End: 2016-06-25

## 2016-04-01 MED ORDER — ESOMEPRAZOLE MAGNESIUM 40 MG PO CPDR: DELAYED_RELEASE_CAPSULE | ORAL | 3 refills | Status: DC
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
## 2016-04-01 NOTE — Patient Instructions (Addendum)
Please go get Dr. Zoe Lan salicylic acid work pads. Place on wart twice a week.    IF you received an x-ray today, you will receive an invoice from [facility]. Please contact [facility] at [phone] with questions or concerns regarding your invoice.   IF you received labwork today, you will receive an invoice from [employer]. Please contact [facility] at [phone] with questions or concerns regarding your invoice.   Our billing staff will not be able to assist you with questions regarding bills from these companies.  You will be contacted with the lab results as soon as they are available. The fastest way to get your results is to activate your My Chart account. Instructions are located on the last page of this paperwork. If you have not heard from [place] regarding the results in 2 weeks, please contact this office.

## 2016-04-02 LAB — HEMOGLOBIN A1C
Hgb A1c MFr Bld: 8.4 % — ABNORMAL HIGH (ref <5.7)
Mean Plasma Glucose: 194 mg/dL

## 2016-04-03 NOTE — Progress Notes (Signed)
He is coming back in three months for a recheck and has been out of medication for about one month now.  I would expect his labs to be abnormal.  Will recheck these labs in follow up. He should be establishing with Dr. Nadyne Coombes in the next few weeks given his drastically elevated risk of CAD. Philis Fendt, MS, PA-C 8:28 PM, 04/03/2016

## 2016-04-05 NOTE — Telephone Encounter (Signed)
Ok to close

## 2016-06-25 ENCOUNTER — Ambulatory Visit (INDEPENDENT_AMBULATORY_CARE_PROVIDER_SITE_OTHER): Payer: 59 | Admitting: Physician Assistant

## 2016-06-25 ENCOUNTER — Telehealth: Payer: Self-pay | Admitting: *Deleted

## 2016-06-25 VITALS — BP 128/86 | HR 75 | Temp 98.6°F | Ht 73.25 in | Wt 269.0 lb

## 2016-06-25 DIAGNOSIS — I10 Essential (primary) hypertension: Secondary | ICD-10-CM | POA: Diagnosis not present

## 2016-06-25 DIAGNOSIS — Z8719 Personal history of other diseases of the digestive system: Secondary | ICD-10-CM | POA: Diagnosis not present

## 2016-06-25 DIAGNOSIS — G8921 Chronic pain due to trauma: Secondary | ICD-10-CM | POA: Diagnosis not present

## 2016-06-25 DIAGNOSIS — E119 Type 2 diabetes mellitus without complications: Secondary | ICD-10-CM

## 2016-06-25 DIAGNOSIS — K219 Gastro-esophageal reflux disease without esophagitis: Secondary | ICD-10-CM | POA: Diagnosis not present

## 2016-06-25 DIAGNOSIS — E785 Hyperlipidemia, unspecified: Secondary | ICD-10-CM

## 2016-06-25 DIAGNOSIS — E039 Hypothyroidism, unspecified: Secondary | ICD-10-CM

## 2016-06-25 DIAGNOSIS — J309 Allergic rhinitis, unspecified: Secondary | ICD-10-CM

## 2016-06-25 DIAGNOSIS — Z79899 Other long term (current) drug therapy: Secondary | ICD-10-CM | POA: Diagnosis not present

## 2016-06-25 MED ORDER — LEVOTHYROXINE SODIUM 100 MCG PO TABS: ORAL_TABLET | ORAL | 1 refills | Status: DC

## 2016-06-25 MED ORDER — RAMIPRIL 10 MG PO CAPS: 10.0000 mg | ORAL_CAPSULE | Freq: Every day | ORAL | 0 refills | Status: DC

## 2016-06-25 MED ORDER — ATORVASTATIN CALCIUM 40 MG PO TABS: ORAL_TABLET | ORAL | 0 refills | Status: DC

## 2016-06-25 MED ORDER — AMLODIPINE BESYLATE 10 MG PO TABS: ORAL_TABLET | ORAL | 0 refills | Status: DC

## 2016-06-25 MED ORDER — CETIRIZINE HCL 10 MG PO TABS: 10.0000 mg | ORAL_TABLET | Freq: Every day | ORAL | 11 refills | Status: DC

## 2016-06-25 MED ORDER — METFORMIN HCL ER 500 MG PO TB24: 2000.0000 mg | ORAL_TABLET | Freq: Every day | ORAL | 1 refills | Status: DC

## 2016-06-25 MED ORDER — ESOMEPRAZOLE MAGNESIUM 40 MG PO CPDR: DELAYED_RELEASE_CAPSULE | ORAL | 3 refills | Status: DC

## 2016-06-25 MED ORDER — HYDROCHLOROTHIAZIDE 25 MG PO TABS: 25.0000 mg | ORAL_TABLET | Freq: Every day | ORAL | 0 refills | Status: DC

## 2016-06-25 NOTE — Telephone Encounter (Signed)
Spoke with Estill Bamberg at [employer] [phone], per [place], [place]  sent over medications for 90 days for pt., she would like to add refills x 1 on each medication.

## 2016-06-25 NOTE — Patient Instructions (Addendum)
Start taking the metformin XR and see if this helps with GI upset. You may want to take 1000mg  daily x 3 days then increase to 2000mg  daily. We will recheck your A1C in three months as we may need to add another agent.   Continue working on [employer] and exercise. A healthy lifestyle is the way to go! Try decreasing the amount of cigarettes you smoke daily instead of just quitting cold [place]. The less the better!!  Make an appointment to see me in three months at [address].   Thank you for letting me participate in your health and well being.     IF you received an x-ray today, you will receive an invoice from [facility]. Please contact [facility] at [phone] with questions or concerns regarding your invoice.   IF you received labwork today, you will receive an invoice from [facility]. Please contact [facility] at [phone] with questions or concerns regarding your invoice.   Our billing staff will not be able to assist you with questions regarding bills from these companies.  You will be contacted with the lab results as soon as they are available. The fastest way to get your results is to activate your My Chart account. Instructions are located on the last page of this paperwork. If you have not heard from [place] regarding the results in 2 weeks, please contact this office.
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
## 2016-06-26 LAB — LIPID PANEL
CHOL/HDL RATIO: 4.4 ratio (ref 0.0–5.0)
CHOLESTEROL TOTAL: 141 mg/dL (ref 100–199)
HDL: 32 mg/dL — ABNORMAL LOW (ref >39)
LDL CALC: 82 mg/dL (ref 0–99)
TRIGLYCERIDES: 137 mg/dL (ref 0–149)
VLDL CHOLESTEROL CAL: 27 mg/dL (ref 5–40)

## 2016-06-26 LAB — CBC WITH DIFFERENTIAL/PLATELET
BASOS: 1 %
Basophils Absolute: 0.1 10*3/uL (ref 0.0–0.2)
EOS (ABSOLUTE): 0.3 10*3/uL (ref 0.0–0.4)
EOS: 3 %
HEMATOCRIT: 49.6 % (ref 37.5–51.0)
HEMOGLOBIN: 17 g/dL (ref 13.0–17.7)
IMMATURE GRANS (ABS): 0.1 10*3/uL (ref 0.0–0.1)
IMMATURE GRANULOCYTES: 1 %
LYMPHS: 23 %
Lymphocytes Absolute: 2.2 10*3/uL (ref 0.7–3.1)
MCH: 30.4 pg (ref 26.6–33.0)
MCHC: 34.3 g/dL (ref 31.5–35.7)
MCV: 89 fL (ref 79–97)
Monocytes Absolute: 0.8 10*3/uL (ref 0.1–0.9)
Monocytes: 9 %
NEUTROS PCT: 63 %
Neutrophils Absolute: 6.2 10*3/uL (ref 1.4–7.0)
Platelets: 272 10*3/uL (ref 150–379)
RBC: 5.6 x10E6/uL (ref 4.14–5.80)
RDW: 14.6 % (ref 12.3–15.4)
WBC: 9.7 10*3/uL (ref 3.4–10.8)

## 2016-06-26 LAB — CMP14+EGFR
ALBUMIN: 4.5 g/dL (ref 3.5–5.5)
ALK PHOS: 61 IU/L (ref 39–117)
ALT: 63 IU/L — ABNORMAL HIGH (ref 0–44)
AST: 31 IU/L (ref 0–40)
Albumin/Globulin Ratio: 1.7 (ref 1.2–2.2)
BUN/Creatinine Ratio: 19 (ref 9–20)
BUN: 15 mg/dL (ref 6–24)
Bilirubin Total: 0.4 mg/dL (ref 0.0–1.2)
CALCIUM: 10.2 mg/dL (ref 8.7–10.2)
CO2: 29 mmol/L (ref 18–29)
CREATININE: 0.81 mg/dL (ref 0.76–1.27)
Chloride: 99 mmol/L (ref 96–106)
GFR calc Af Amer: 118 mL/min/{1.73_m2} (ref >59)
GFR, EST NON AFRICAN AMERICAN: 102 mL/min/{1.73_m2} (ref >59)
GLOBULIN, TOTAL: 2.7 g/dL (ref 1.5–4.5)
GLUCOSE: 163 mg/dL — AB (ref 65–99)
Potassium: 4.4 mmol/L (ref 3.5–5.2)
SODIUM: 145 mmol/L — AB (ref 134–144)
Total Protein: 7.2 g/dL (ref 6.0–8.5)

## 2016-06-26 LAB — TSH: TSH: 4.9 u[IU]/mL — ABNORMAL HIGH (ref 0.450–4.500)

## 2016-06-26 LAB — HEMOGLOBIN A1C
ESTIMATED AVERAGE GLUCOSE: 197 mg/dL
Hgb A1c MFr Bld: 8.5 % — ABNORMAL HIGH (ref 4.8–5.6)

## 2016-06-26 LAB — T3, FREE: T3, Free: 3.4 pg/mL (ref 2.0–4.4)

## 2016-06-26 LAB — T4, FREE: Free T4: 1.17 ng/dL (ref 0.82–1.77)

## 2016-06-26 LAB — MAGNESIUM: MAGNESIUM: 2.2 mg/dL (ref 1.6–2.3)

## 2016-07-02 ENCOUNTER — Other Ambulatory Visit: Payer: Self-pay | Admitting: Physician Assistant

## 2016-07-02 MED ORDER — GLIPIZIDE 5 MG PO TABS: 2.5000 mg | ORAL_TABLET | Freq: Every day | ORAL | 0 refills | Status: DC

## 2016-07-02 NOTE — Progress Notes (Signed)
Follow up in 6 weeks for recheck liver enzymes and blood sugar.  Meds ordered this encounter  Medications  . glipiZIDE (GLUCOTROL) 5 MG tablet    Sig: Take 0.5 tablets (2.5 mg total) by mouth daily before breakfast.    Dispense:  30 tablet    Refill:  0    Order Specific Question:   Supervising Provider    Answer:   Wardell Honour [[id number]]

## 2016-07-17 ENCOUNTER — Other Ambulatory Visit: Payer: Self-pay | Admitting: Physician Assistant

## 2016-07-17 DIAGNOSIS — E118 Type 2 diabetes mellitus with unspecified complications: Secondary | ICD-10-CM

## 2016-07-21 ENCOUNTER — Ambulatory Visit (INDEPENDENT_AMBULATORY_CARE_PROVIDER_SITE_OTHER): Payer: 59

## 2016-07-21 ENCOUNTER — Ambulatory Visit (INDEPENDENT_AMBULATORY_CARE_PROVIDER_SITE_OTHER): Payer: 59 | Admitting: Physician Assistant

## 2016-07-21 DIAGNOSIS — G8929 Other chronic pain: Secondary | ICD-10-CM | POA: Diagnosis not present

## 2016-07-21 DIAGNOSIS — M1612 Unilateral primary osteoarthritis, left hip: Secondary | ICD-10-CM

## 2016-07-21 DIAGNOSIS — M25561 Pain in right knee: Secondary | ICD-10-CM | POA: Diagnosis not present

## 2016-07-21 DIAGNOSIS — M25552 Pain in left hip: Secondary | ICD-10-CM
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
## 2016-07-23 ENCOUNTER — Other Ambulatory Visit: Payer: Self-pay

## 2016-07-23 NOTE — Telephone Encounter (Signed)
Fax req [facility] [address] pt requests 90 day supply #360 Metformin Pt due for follow up 3 mo. sent

## 2016-08-06 ENCOUNTER — Other Ambulatory Visit (INDEPENDENT_AMBULATORY_CARE_PROVIDER_SITE_OTHER): Payer: Self-pay | Admitting: Physician Assistant

## 2016-08-06 NOTE — Progress Notes (Signed)
Preop on 2/13/ at 1000.  Needs orders in epic.  Thank You.

## 2016-08-09 NOTE — Patient Instructions (Addendum)
Colin Ramirez  08/09/2016   Your procedure is scheduled on: Friday 08/20/2016  Report to [facility] Main  Entrance take [place]  elevators to 3rd floor to  [place] at   0800 AM.  Call this number if you have problems the morning of surgery [phone]   Remember: ONLY 1 PERSON MAY GO WITH YOU TO SHORT STAY TO GET  READY MORNING OF Colin Ramirez.   Do not eat food or drink liquids :After Midnight.   How to Manage Your Diabetes Before and After Surgery  Why is it important to control my blood sugar before and after surgery? . Improving blood sugar levels before and after surgery helps healing and can limit problems. . A way of improving blood sugar control is eating a healthy diet by: o  Eating less sugar and carbohydrates o  Increasing activity/exercise o  Talking with your doctor about reaching your blood sugar goals . High blood sugars (greater than 180 mg/dL) can raise your risk of infections and slow your recovery, so you will need to focus on controlling your diabetes during the weeks before surgery. . Make sure that the doctor who takes care of your diabetes knows about your planned surgery including the date and location.  How do I manage my blood sugar before surgery? . Check your blood sugar at least 4 times a day, starting 2 days before surgery, to make sure that the level is not too high or low. o Check your blood sugar the morning of your surgery when you wake up and every 2 hours until you get to the Short Stay unit. . If your blood sugar is less than 70 mg/dL, you will need to treat for low blood sugar: o Do not take insulin. o Treat a low blood sugar (less than 70 mg/dL) with  cup of clear juice (cranberry or apple), 4 glucose tablets, OR glucose gel. o Recheck blood sugar in 15 minutes after treatment (to make sure it is greater than 70 mg/dL). If your blood sugar is not greater than 70 mg/dL on recheck, call [phone] for further  instructions. . Report your blood sugar to the short stay nurse when you get to Short Stay.  . If you are admitted to the hospital after surgery: o Your blood sugar will be checked by the staff and you will probably be given insulin after surgery (instead of oral diabetes medicines) to make sure you have good blood sugar levels. o The goal for blood sugar control after surgery is 80-180 mg/dL.   WHAT DO I DO ABOUT MY DIABETES MEDICATION?  [name] Do not take oral diabetes medicines (pills) the morning of surgery.!     Take these medicines the morning of surgery with A SIP OF WATER: Amlodipine (Norvasc), Levothyroxine, Esomeprazole (Nexium)                                 You may not have any metal on your body including hair pins and              piercings  Do not wear jewelry,lotions, powders or perfumes, deodorant                          Men may shave face and neck.   Do not bring  valuables to the hospital. [place].  Contacts, dentures or bridgework may not be worn into surgery.  Leave suitcase in the car. After surgery it may be brought to your room.                  Please read over the following fact sheets you were given: _____________________________________________________________________             [facility] - Preparing for Surgery Before surgery, you can play an important role.  Because skin is not sterile, your skin needs to be as free of germs as possible.  You can reduce the number of germs on your skin by washing with CHG (chlorahexidine gluconate) soap before surgery.  CHG is an antiseptic cleaner which kills germs and bonds with the skin to continue killing germs even after washing. Please DO NOT use if you have an allergy to CHG or antibacterial soaps.  If your skin becomes reddened/irritated stop using the CHG and inform your nurse when you arrive at Short Stay. Do not shave (including legs and underarms) for at  least 48 hours prior to the first CHG shower.  You may shave your face/neck. Please follow these instructions carefully:  1.  Shower with CHG Soap the night before surgery and the  morning of Surgery.  2.  If you choose to wash your hair, wash your hair first as usual with your  normal  shampoo.  3.  After you shampoo, rinse your hair and body thoroughly to remove the  shampoo.                           4.  Use CHG as you would any other liquid soap.  You can apply chg directly  to the skin and wash                       Gently with a scrungie or clean washcloth.  5.  Apply the CHG Soap to your body ONLY FROM THE NECK DOWN.   Do not use on face/ open                           Wound or open sores. Avoid contact with eyes, ears mouth and genitals (private parts).                       Wash face,  Genitals (private parts) with your normal soap.             6.  Wash thoroughly, paying special attention to the area where your surgery  will be performed.  7.  Thoroughly rinse your body with warm water from the neck down.  8.  DO NOT shower/wash with your normal soap after using and rinsing off  the CHG Soap.                9.  Pat yourself dry with a clean towel.            10.  Wear clean pajamas.            11.  Place clean sheets on your bed the night of your first shower and do not  sleep with pets. Day of Surgery : Do not apply any lotions/deodorants the  morning of surgery.  Please wear clean clothes to the hospital/surgery center.  FAILURE TO FOLLOW THESE INSTRUCTIONS MAY RESULT IN THE CANCELLATION OF YOUR SURGERY PATIENT SIGNATURE_________________________________  NURSE SIGNATURE__________________________________  ________________________________________________________________________   Colin Ramirez  An incentive spirometer is a tool that can help keep your lungs clear and active. This tool measures how well you are filling your lungs with each breath. Taking long deep breaths  may help reverse or decrease the chance of developing breathing (pulmonary) problems (especially infection) following:  A long period of time when you are unable to move or be active. BEFORE THE PROCEDURE   If the spirometer includes an indicator to show your best effort, your nurse or respiratory therapist will set it to a desired goal.  If possible, sit up straight or lean slightly forward. Try not to slouch.  Hold the incentive spirometer in an upright position. INSTRUCTIONS FOR USE  1. Sit on the edge of your bed if possible, or sit up as far as you can in bed or on a chair. 2. Hold the incentive spirometer in an upright position. 3. Breathe out normally. 4. Place the mouthpiece in your mouth and seal your lips tightly around it. 5. Breathe in slowly and as deeply as possible, raising the piston or the ball toward the top of the column. 6. Hold your breath for 3-5 seconds or for as long as possible. Allow the piston or ball to fall to the bottom of the column. 7. Remove the mouthpiece from your mouth and breathe out normally. 8. Rest for a few seconds and repeat Steps 1 through 7 at least 10 times every 1-2 hours when you are awake. Take your time and take a few normal breaths between deep breaths. 9. The spirometer may include an indicator to show your best effort. Use the indicator as a goal to work toward during each repetition. 10. After each set of 10 deep breaths, practice coughing to be sure your lungs are clear. If you have an incision (the cut made at the time of surgery), support your incision when coughing by placing a pillow or rolled up towels firmly against it. Once you are able to get out of bed, walk around indoors and cough well. You may stop using the incentive spirometer when instructed by your caregiver.  RISKS AND COMPLICATIONS  Take your time so you do not get dizzy or light-headed.  If you are in pain, you may need to take or ask for pain medication before doing  incentive spirometry. It is harder to take a deep breath if you are having pain. AFTER USE  Rest and breathe slowly and easily.  It can be helpful to keep track of a log of your progress. Your caregiver can provide you with a simple table to help with this. If you are using the spirometer at home, follow these instructions: [place] IF:   You are having difficultly using the spirometer.  You have trouble using the spirometer as often as instructed.  Your pain medication is not giving enough relief while using the spirometer.  You develop fever of 100.5 F (38.1 C) or higher. SEEK IMMEDIATE MEDICAL CARE IF:   You cough up bloody sputum that had not been present before.  You develop fever of 102 F (38.9 C) or greater.  You develop worsening pain at or near the incision site. MAKE SURE YOU:   Understand these instructions.  Will watch your condition.  Will get help right  away if you are not doing well or get worse. Document Released: 10/25/2006 Document Revised: 09/06/2011 Document Reviewed: 12/26/2006 Exit[employer] Patient Information 2014 Exit[employer], [place].   ________________________________________________________________________  WHAT IS A BLOOD TRANSFUSION? Blood Transfusion Information  A transfusion is the replacement of blood or some of its parts. Blood is made up of multiple cells which provide different functions.  Red blood cells carry oxygen and are used for blood loss replacement.  White blood cells fight against infection.  Platelets control bleeding.  Plasma helps clot blood.  Other blood products are available for specialized needs, such as hemophilia or other clotting disorders. BEFORE THE TRANSFUSION  Who gives blood for transfusions?   Healthy volunteers who are fully evaluated to make sure their blood is safe. This is blood bank blood. Transfusion therapy is the safest it has ever been in the practice of medicine. Before blood is taken from a  donor, a complete history is taken to make sure that person has no history of diseases nor engages in risky social behavior (examples are intravenous drug use or sexual activity with multiple partners). The donor's travel history is screened to minimize risk of transmitting infections, such as malaria. The donated blood is tested for signs of infectious diseases, such as HIV and hepatitis. The blood is then tested to be sure it is compatible with you in order to minimize the chance of a transfusion reaction. If you or a relative donates blood, this is often done in anticipation of surgery and is not appropriate for emergency situations. It takes many days to process the donated blood. RISKS AND COMPLICATIONS Although transfusion therapy is very safe and saves many lives, the main dangers of transfusion include:   Getting an infectious disease.  Developing a transfusion reaction. This is an allergic reaction to something in the blood you were given. Every precaution is taken to prevent this. The decision to have a blood transfusion has been considered carefully by your caregiver before blood is given. Blood is not given unless the benefits outweigh the risks. AFTER THE TRANSFUSION  Right after receiving a blood transfusion, you will usually feel much better and more energetic. This is especially true if your red blood cells have gotten low (anemic). The transfusion raises the level of the red blood cells which carry oxygen, and this usually causes an energy increase.  The nurse administering the transfusion will monitor you carefully for complications. HOME CARE INSTRUCTIONS  No special instructions are needed after a transfusion. You may find your energy is better. Speak with your caregiver about any limitations on activity for underlying diseases you may have. SEEK MEDICAL CARE IF:   Your condition is not improving after your transfusion.  You develop redness or irritation at the intravenous (IV)  site. SEEK IMMEDIATE MEDICAL CARE IF:  Any of the following symptoms occur over the next 12 hours:  Shaking chills.  You have a temperature by mouth above 102 F (38.9 C), not controlled by medicine.  Chest, back, or muscle pain.  People around you feel you are not acting correctly or are confused.  Shortness of breath or difficulty breathing.  Dizziness and fainting.  You get a rash or develop hives.  You have a decrease in urine output.  Your urine turns a dark color or changes to pink, red, or brown. Any of the following symptoms occur over the next 10 days:  You have a temperature by mouth above 102 F (38.9 C), not controlled by medicine.  Shortness  of breath.  Weakness after normal activity.  The white part of the eye turns yellow (jaundice).  You have a decrease in the amount of urine or are urinating less often.  Your urine turns a dark color or changes to pink, red, or brown. Document Released: 06/11/2000 Document Revised: 09/06/2011 Document Reviewed: 01/29/2008 [facility] Patient Information 2014 [place], [place].  _______________________________________________________________________

## 2016-08-10 ENCOUNTER — Encounter (HOSPITAL_COMMUNITY)
Admission: RE | Admit: 2016-08-10 | Discharge: 2016-08-10 | Disposition: A | Discharge status: Home / Self Care | Payer: 59 | Source: Ambulatory Visit | Attending: Orthopaedic Surgery | Admitting: Orthopaedic Surgery

## 2016-08-10 ENCOUNTER — Other Ambulatory Visit (INDEPENDENT_AMBULATORY_CARE_PROVIDER_SITE_OTHER): Payer: Self-pay | Admitting: Physician Assistant

## 2016-08-10 ENCOUNTER — Encounter (HOSPITAL_COMMUNITY): Payer: Self-pay

## 2016-08-10 ENCOUNTER — Encounter (INDEPENDENT_AMBULATORY_CARE_PROVIDER_SITE_OTHER): Payer: Self-pay

## 2016-08-10 DIAGNOSIS — Z01810 Encounter for preprocedural cardiovascular examination: Secondary | ICD-10-CM | POA: Diagnosis not present

## 2016-08-10 DIAGNOSIS — Z01812 Encounter for preprocedural laboratory examination: Secondary | ICD-10-CM | POA: Diagnosis not present

## 2016-08-10 HISTORY — DX: Unspecified osteoarthritis, unspecified site: M19.90

## 2016-08-10 HISTORY — DX: Pneumonia, unspecified organism: J18.9

## 2016-08-10 HISTORY — DX: Hypothyroidism, unspecified: E03.9

## 2016-08-10 HISTORY — DX: Sleep apnea, unspecified: G47.30

## 2016-08-10 LAB — SURGICAL PCR SCREEN
MRSA, PCR: NEGATIVE
Staphylococcus aureus: NEGATIVE

## 2016-08-10 LAB — BASIC METABOLIC PANEL
ANION GAP: 9 (ref 5–15)
BUN: 17 mg/dL (ref 6–20)
CO2: 31 mmol/L (ref 22–32)
Calcium: 9.5 mg/dL (ref 8.9–10.3)
Chloride: 98 mmol/L — ABNORMAL LOW (ref 101–111)
Creatinine, Ser: 0.95 mg/dL (ref 0.61–1.24)
GFR calc Af Amer: >60 mL/min (ref >60)
GLUCOSE: 173 mg/dL — AB (ref 65–99)
POTASSIUM: 4.1 mmol/L (ref 3.5–5.1)
SODIUM: 138 mmol/L (ref 135–145)

## 2016-08-10 LAB — CBC
HCT: 45.8 % (ref 39.0–52.0)
Hemoglobin: 15.8 g/dL (ref 13.0–17.0)
MCH: 29.3 pg (ref 26.0–34.0)
MCHC: 34.5 g/dL (ref 30.0–36.0)
MCV: 85 fL (ref 78.0–100.0)
PLATELETS: 261 10*3/uL (ref 150–400)
RBC: 5.39 MIL/uL (ref 4.22–5.81)
RDW: 13.7 % (ref 11.5–15.5)
WBC: 9.6 10*3/uL (ref 4.0–10.5)

## 2016-08-10 LAB — GLUCOSE, CAPILLARY: Glucose-Capillary: 186 mg/dL — ABNORMAL HIGH (ref 65–99)

## 2016-08-10 LAB — ABO/RH: ABO/RH(D): A POS

## 2016-08-10 NOTE — Progress Notes (Signed)
04/01/16 CXR epic 06/25/16 hgb a1c 8.5 epic

## 2016-08-11 ENCOUNTER — Encounter: Payer: Self-pay | Admitting: Physician Assistant

## 2016-08-11 ENCOUNTER — Ambulatory Visit (INDEPENDENT_AMBULATORY_CARE_PROVIDER_SITE_OTHER): Payer: 59 | Admitting: Physician Assistant

## 2016-08-11 VITALS — BP 126/82 | HR 77 | Temp 98.2°F | Resp 16 | Ht 73.0 in | Wt 271.0 lb

## 2016-08-11 DIAGNOSIS — E119 Type 2 diabetes mellitus without complications: Secondary | ICD-10-CM

## 2016-08-11 DIAGNOSIS — I10 Essential (primary) hypertension: Secondary | ICD-10-CM | POA: Diagnosis not present

## 2016-08-11 DIAGNOSIS — R748 Abnormal levels of other serum enzymes: Secondary | ICD-10-CM

## 2016-08-11 DIAGNOSIS — Z1159 Encounter for screening for other viral diseases: Secondary | ICD-10-CM

## 2016-08-11 DIAGNOSIS — Z23 Encounter for immunization: Secondary | ICD-10-CM

## 2016-08-11 LAB — GLUCOSE, POCT (MANUAL RESULT ENTRY)

## 2016-08-11 MED ORDER — GLIPIZIDE 5 MG PO TABS: 2.5000 mg | ORAL_TABLET | Freq: Every day | ORAL | 1 refills | Status: DC

## 2016-08-11 MED ORDER — METFORMIN HCL ER 500 MG PO TB24: 2000.0000 mg | ORAL_TABLET | Freq: Every day | ORAL | 1 refills | Status: DC

## 2016-08-11 MED ORDER — AMLODIPINE BESYLATE 10 MG PO TABS: ORAL_TABLET | ORAL | 0 refills | Status: DC

## 2016-08-11 MED ORDER — RAMIPRIL 10 MG PO CAPS: 10.0000 mg | ORAL_CAPSULE | Freq: Every day | ORAL | 0 refills | Status: DC

## 2016-08-11 MED ORDER — HYDROCHLOROTHIAZIDE 25 MG PO TABS: 25.0000 mg | ORAL_TABLET | Freq: Every day | ORAL | 0 refills | Status: DC

## 2016-08-11 NOTE — Progress Notes (Signed)
MRN: [id number]  Subjective:   Colin Ramirez is a 51 y.o. male who presents for follow up of Type 2 diabetes mellitus and elevated liver enzymes. Originally saw me on 06/25/16 for uncontrolled diabetes. Found to have elevated liver enzymes. He was instructed to decrease bc powder consumption and notes he has cut down to 2 a day.   Patient is currently managed with Metformin XR 1024m BID and glipizide 2.559mdaily which has been somewhat effective. Admits fair compliance.  Pt did run out of both medications 2 weeks ago so he did not call for refill of glipizide and started using his old metformin immediate release, which bothers his stomach. Denies adverse effects with the typical regimen including metallic taste, hypoglycemia, nausea, vomiting, and abdominal pain.   Patient is checking home blood sugars. Home blood sugar records: BGs range between 140 and 190. Current symptoms include none. Patient denies foot ulcerations, hypoglycemia , paresthesia of the feet, polydipsia, polyuria, visual disturbances, vomiting and weight loss. Patient is checking their feet daily. No foot concerns. Pt does not drink alcohol.   He also needs refills for HTN and HLD medications as he will run out in a few weeks. Denies chest pain, headache, SOB, heart palpitations, and hematuria.   Pt last ate right upon arrival.    Objective:   PHYSICAL EXAM BP 126/82   Pulse 77   Temp 98.2 F (36.8 C) (Oral)   Resp 16   Ht 6' 1"  (1.854 m)   Wt 271 lb (122.9 kg)   SpO2 95%   BMI 35.75 kg/m   Physical Exam  Constitutional: He is oriented to person, place, and time. He appears well-developed and well-nourished.  HENT:  Head: Normocephalic and atraumatic.  Eyes: Conjunctivae are normal.  Neck: Normal range of motion.  Cardiovascular: Normal rate, regular rhythm, normal heart sounds and intact distal pulses.   Pulmonary/Chest: Effort normal.  Abdominal: Soft. Bowel sounds are normal. There is no tenderness.    Musculoskeletal:       Right lower leg: He exhibits no swelling.       Left lower leg: He exhibits no swelling.  Neurological: He is alert and oriented to person, place, and time.  Skin: Skin is warm and dry.  Psychiatric: He has a normal mood and affect.  Vitals reviewed.   Wt Readings from Last 3 Encounters:  08/11/16 271 lb (122.9 kg)  08/10/16 269 lb (122 kg)  06/25/16 269 lb (122 kg)   Results for orders placed or performed in visit on 08/11/16 (from the past 24 hour(s))  POCT glucose (manual entry)     Status: None   Collection Time: 08/11/16  2:20 PM  Result Value Ref Range   POC Glucose 1825f - 99 mg/dl    Assessment and Plan :  1. Type 2 diabetes mellitus without complication, without long-term current use of insulin (HCC) Glucose elevated in office, which is expected as patient ate two hot dogs and a large mocha in parking lot prior to appointment. Will continue meformin XR as he tolerates this well and has no stomach issues on it along with glipizide 2.5mg21mily. If liver enzymes are normal, may increase to glipizide 5mg 35mly. If liver enzymes elevated, will order hepatitis panel and depending on those results a liver US. PKoreainstructed to follow up in 6 weeks for diabetes reevaluation and blood work.  - CMP14+EGFR - POCT glucose (manual entry) - glipiZIDE (GLUCOTROL) 5 MG tablet; Take 0.5 tablets (2.5  mg total) by mouth daily before breakfast.  Dispense: 90 tablet; Refill: 1 - metFORMIN (GLUCOPHAGE XR) 500 MG 24 hr tablet; Take 4 tablets (2,000 mg total) by mouth daily with breakfast.  Dispense: 360 tablet; Refill: 1  2. Essential hypertension -Well controlled. Follow up in 3 months.  - amLODipine (NORVASC) 10 MG tablet; TAKE 1 TABLET (10 MG TOTAL) BY MOUTH DAILY  Dispense: 90 tablet; Refill: 0 - hydrochlorothiazide (HYDRODIURIL) 25 MG tablet; Take 1 tablet (25 mg total) by mouth daily.  Dispense: 90 tablet; Refill: 0 - ramipril (ALTACE) 10 MG capsule; Take 1 capsule  (10 mg total) by mouth daily.  Dispense: 90 capsule; Refill: 0

## 2016-08-11 NOTE — Patient Instructions (Addendum)
Continue taking 1000mg  twice daily of metformin XR and glipizide 2.5mg  daily.  I will contact you with your lab results and we will discuss possibly increasing your glipizide versus getting additional labs. Continue working on decreasing bc powder and increasing water consumption.   I have given you enough refills for htn medications, if you run out of anything call our office.  Follow up at the end of March for next check up.  Good luck with your surgery, you are going to do great!    IF you received an x-ray today, you will receive an invoice from [facility]. Please contact [facility] at [phone] with questions or concerns regarding your invoice.   IF you received labwork today, you will receive an invoice from [facility]. Please contact [facility] at [phone] with questions or concerns regarding your invoice.   Our billing staff will not be able to assist you with questions regarding bills from these companies.  You will be contacted with the lab results as soon as they are available. The fastest way to get your results is to activate your My Chart account. Instructions are located on the last page of this paperwork. If you have not heard from [place] regarding the results in 2 weeks, please contact this office.

## 2016-08-12 LAB — CMP14+EGFR
A/G RATIO: 1.6 (ref 1.2–2.2)
ALBUMIN: 4.2 g/dL (ref 3.5–5.5)
ALT: 59 IU/L — ABNORMAL HIGH (ref 0–44)
AST: 32 IU/L (ref 0–40)
Alkaline Phosphatase: 48 IU/L (ref 39–117)
BUN / CREAT RATIO: 18 (ref 9–20)
BUN: 14 mg/dL (ref 6–24)
Bilirubin Total: 0.3 mg/dL (ref 0.0–1.2)
CALCIUM: 9.5 mg/dL (ref 8.7–10.2)
CO2: 26 mmol/L (ref 18–29)
CREATININE: 0.76 mg/dL (ref 0.76–1.27)
Chloride: 96 mmol/L (ref 96–106)
GFR, EST AFRICAN AMERICAN: 121 mL/min/{1.73_m2} (ref >59)
GFR, EST NON AFRICAN AMERICAN: 105 mL/min/{1.73_m2} (ref >59)
GLOBULIN, TOTAL: 2.6 g/dL (ref 1.5–4.5)
Glucose: 172 mg/dL — ABNORMAL HIGH (ref 65–99)
POTASSIUM: 3.7 mmol/L (ref 3.5–5.2)
SODIUM: 140 mmol/L (ref 134–144)
Total Protein: 6.8 g/dL (ref 6.0–8.5)

## 2016-08-12 NOTE — Addendum Note (Signed)
Addended by: Gari Crown D on: 08/12/2016 10:36 AM   Modules accepted: Orders

## 2016-08-12 NOTE — Addendum Note (Signed)
Addended by: Gari Crown D on: 08/12/2016 11:50 AM   Modules accepted: Orders

## 2016-08-12 NOTE — Addendum Note (Signed)
Addended by: Gari Crown D on: 08/12/2016 06:36 PM   Modules accepted: Orders

## 2016-08-17 LAB — HEPATITIS A ANTIBODY, IGM: Hep A IgM: NEGATIVE

## 2016-08-17 LAB — HEPATITIS C ANTIBODY: Hep C Virus Ab: <0.1 s/co ratio (ref 0.0–0.9)

## 2016-08-17 LAB — SPECIMEN STATUS REPORT

## 2016-08-17 LAB — HEPATITIS B SURFACE ANTIBODY, QUANTITATIVE: Hepatitis B Surf Ab Quant: <3.1 m[IU]/mL — ABNORMAL LOW (ref >9.9)

## 2016-08-18 NOTE — Addendum Note (Signed)
Addended by: Gari Crown D on: 08/18/2016 09:22 AM   Modules accepted: Orders

## 2016-08-19 LAB — HEPATITIS B SURFACE ANTIGEN: Hepatitis B Surface Ag: NEGATIVE

## 2016-08-20 ENCOUNTER — Inpatient Hospital Stay (HOSPITAL_COMMUNITY): Payer: 59

## 2016-08-20 ENCOUNTER — Encounter (HOSPITAL_COMMUNITY): Payer: Self-pay

## 2016-08-20 ENCOUNTER — Inpatient Hospital Stay (HOSPITAL_COMMUNITY): Payer: 59 | Admitting: Anesthesiology

## 2016-08-20 ENCOUNTER — Encounter (HOSPITAL_COMMUNITY)
Admission: RE | Disposition: A | Discharge status: Home Health | Payer: Self-pay | Source: Ambulatory Visit | Attending: Orthopaedic Surgery

## 2016-08-20 ENCOUNTER — Other Ambulatory Visit: Payer: Self-pay | Admitting: Physician Assistant

## 2016-08-20 ENCOUNTER — Inpatient Hospital Stay (HOSPITAL_COMMUNITY)
Admission: RE | Admit: 2016-08-20 | Discharge: 2016-08-21 | DRG: 470 | Disposition: A | Discharge status: Home Health | Payer: 59 | Source: Ambulatory Visit | Attending: Orthopaedic Surgery | Admitting: Orthopaedic Surgery

## 2016-08-20 DIAGNOSIS — Z96642 Presence of left artificial hip joint: Secondary | ICD-10-CM

## 2016-08-20 DIAGNOSIS — E669 Obesity, unspecified: Secondary | ICD-10-CM | POA: Diagnosis present

## 2016-08-20 DIAGNOSIS — Z888 Allergy status to other drugs, medicaments and biological substances status: Secondary | ICD-10-CM | POA: Diagnosis not present

## 2016-08-20 DIAGNOSIS — G4730 Sleep apnea, unspecified: Secondary | ICD-10-CM | POA: Diagnosis present

## 2016-08-20 DIAGNOSIS — G8929 Other chronic pain: Secondary | ICD-10-CM | POA: Diagnosis present

## 2016-08-20 DIAGNOSIS — I10 Essential (primary) hypertension: Secondary | ICD-10-CM | POA: Diagnosis present

## 2016-08-20 DIAGNOSIS — Z6837 Body mass index (BMI) 37.0-37.9, adult: Secondary | ICD-10-CM

## 2016-08-20 DIAGNOSIS — M1612 Unilateral primary osteoarthritis, left hip: Principal | ICD-10-CM | POA: Diagnosis present

## 2016-08-20 DIAGNOSIS — M549 Dorsalgia, unspecified: Secondary | ICD-10-CM | POA: Diagnosis present

## 2016-08-20 DIAGNOSIS — D62 Acute posthemorrhagic anemia: Secondary | ICD-10-CM | POA: Diagnosis not present

## 2016-08-20 DIAGNOSIS — Z419 Encounter for procedure for purposes other than remedying health state, unspecified: Secondary | ICD-10-CM

## 2016-08-20 DIAGNOSIS — E119 Type 2 diabetes mellitus without complications: Secondary | ICD-10-CM | POA: Diagnosis present

## 2016-08-20 DIAGNOSIS — F17210 Nicotine dependence, cigarettes, uncomplicated: Secondary | ICD-10-CM | POA: Diagnosis present

## 2016-08-20 DIAGNOSIS — E039 Hypothyroidism, unspecified: Secondary | ICD-10-CM | POA: Diagnosis present

## 2016-08-20 DIAGNOSIS — K219 Gastro-esophageal reflux disease without esophagitis: Secondary | ICD-10-CM | POA: Diagnosis present

## 2016-08-20 DIAGNOSIS — E785 Hyperlipidemia, unspecified: Secondary | ICD-10-CM | POA: Diagnosis present

## 2016-08-20 DIAGNOSIS — R748 Abnormal levels of other serum enzymes: Secondary | ICD-10-CM

## 2016-08-20 HISTORY — PX: TOTAL HIP ARTHROPLASTY: SHX124

## 2016-08-20 LAB — GLUCOSE, CAPILLARY
GLUCOSE-CAPILLARY: 227 mg/dL — AB (ref 65–99)
Glucose-Capillary: 204 mg/dL — ABNORMAL HIGH (ref 65–99)
Glucose-Capillary: 129 mg/dL — ABNORMAL HIGH (ref 65–99)
Glucose-Capillary: 284 mg/dL — ABNORMAL HIGH (ref 65–99)
Glucose-Capillary: 402 mg/dL — ABNORMAL HIGH (ref 65–99)

## 2016-08-20 LAB — TYPE AND SCREEN
ABO/RH(D): A POS
ANTIBODY SCREEN: NEGATIVE

## 2016-08-20 SURGERY — ARTHROPLASTY, HIP, TOTAL, ANTERIOR APPROACH
Anesthesia: Spinal | Site: Hip | Laterality: Left

## 2016-08-20 MED ORDER — METOCLOPRAMIDE HCL 5 MG PO TABS: 5.0000 mg | ORAL_TABLET | Freq: Three times a day (TID) | ORAL | Status: DC | PRN

## 2016-08-20 MED ORDER — SODIUM CHLORIDE 0.9 % IJ SOLN
INTRAMUSCULAR | Status: AC
  Filled 2016-08-20: qty 50

## 2016-08-20 MED ORDER — HYDROMORPHONE HCL 1 MG/ML IJ SOLN: 0.2500 mg | INTRAMUSCULAR | Status: DC | PRN

## 2016-08-20 MED ORDER — DEXAMETHASONE SODIUM PHOSPHATE 10 MG/ML IJ SOLN
INTRAMUSCULAR | Status: DC | PRN
  Administered 2016-08-20: 10 mg via INTRAVENOUS

## 2016-08-20 MED ORDER — METHOCARBAMOL 1000 MG/10ML IJ SOLN
500.0000 mg | Freq: Four times a day (QID) | INTRAMUSCULAR | Status: DC | PRN
  Administered 2016-08-20: 500 mg via INTRAVENOUS
  Filled 2016-08-20: qty 550
  Filled 2016-08-20: qty 5

## 2016-08-20 MED ORDER — CLINDAMYCIN PHOSPHATE 600 MG/50ML IV SOLN
600.0000 mg | Freq: Four times a day (QID) | INTRAVENOUS | Status: AC
  Administered 2016-08-20 (×2): 600 mg via INTRAVENOUS
  Filled 2016-08-20 (×2): qty 50

## 2016-08-20 MED ORDER — ATORVASTATIN CALCIUM 20 MG PO TABS
40.0000 mg | ORAL_TABLET | Freq: Every day | ORAL | Status: DC
  Administered 2016-08-20 – 2016-08-21 (×2): 40 mg via ORAL
  Filled 2016-08-20 (×2): qty 2

## 2016-08-20 MED ORDER — BUPIVACAINE LIPOSOME 1.3 % IJ SUSP
20.0000 mL | Freq: Once | INTRAMUSCULAR | Status: AC
  Administered 2016-08-20: 20 mL
  Filled 2016-08-20: qty 20

## 2016-08-20 MED ORDER — MENTHOL 3 MG MT LOZG: 1.0000 | LOZENGE | OROMUCOSAL | Status: DC | PRN

## 2016-08-20 MED ORDER — RAMIPRIL 10 MG PO CAPS
10.0000 mg | ORAL_CAPSULE | Freq: Every day | ORAL | Status: DC
  Administered 2016-08-21: 11:00:00 10 mg via ORAL
  Filled 2016-08-20: qty 1

## 2016-08-20 MED ORDER — CLINDAMYCIN PHOSPHATE 900 MG/50ML IV SOLN
INTRAVENOUS | Status: AC
  Filled 2016-08-20: qty 50

## 2016-08-20 MED ORDER — AMLODIPINE BESYLATE 10 MG PO TABS
10.0000 mg | ORAL_TABLET | Freq: Every day | ORAL | Status: DC
  Administered 2016-08-21: 10 mg via ORAL
  Filled 2016-08-20 (×2): qty 1

## 2016-08-20 MED ORDER — ASPIRIN 81 MG PO CHEW
81.0000 mg | CHEWABLE_TABLET | Freq: Two times a day (BID) | ORAL | Status: DC
  Administered 2016-08-20 – 2016-08-21 (×2): 81 mg via ORAL
  Filled 2016-08-20 (×2): qty 1

## 2016-08-20 MED ORDER — ONDANSETRON HCL 4 MG/2ML IJ SOLN: 4.0000 mg | Freq: Four times a day (QID) | INTRAMUSCULAR | Status: DC | PRN

## 2016-08-20 MED ORDER — SODIUM CHLORIDE 0.9 % IJ SOLN
INTRAMUSCULAR | Status: DC | PRN
  Administered 2016-08-20: 20 mL

## 2016-08-20 MED ORDER — SODIUM CHLORIDE 0.9 % IR SOLN
Status: DC | PRN
  Administered 2016-08-20: 1000 mL

## 2016-08-20 MED ORDER — MIDAZOLAM HCL 2 MG/2ML IJ SOLN
INTRAMUSCULAR | Status: AC
Start: 2016-08-20 — End: 2016-08-20
  Filled 2016-08-20: qty 2

## 2016-08-20 MED ORDER — HYDROCHLOROTHIAZIDE 25 MG PO TABS
25.0000 mg | ORAL_TABLET | Freq: Every day | ORAL | Status: DC
  Administered 2016-08-21: 25 mg via ORAL
  Filled 2016-08-20: qty 1

## 2016-08-20 MED ORDER — ACETAMINOPHEN 500 MG PO TABS
1000.0000 mg | ORAL_TABLET | Freq: Once | ORAL | Status: AC
  Administered 2016-08-20: 1000 mg via ORAL

## 2016-08-20 MED ORDER — METFORMIN HCL ER 500 MG PO TB24
2000.0000 mg | ORAL_TABLET | Freq: Every day | ORAL | Status: DC
  Administered 2016-08-21: 2000 mg via ORAL
  Filled 2016-08-20: qty 4

## 2016-08-20 MED ORDER — FENTANYL CITRATE (PF) 100 MCG/2ML IJ SOLN
INTRAMUSCULAR | Status: AC
  Filled 2016-08-20: qty 2

## 2016-08-20 MED ORDER — LIDOCAINE HCL (PF) 2 % IJ SOLN
INTRAMUSCULAR | Status: DC | PRN
  Administered 2016-08-20: 100 mg via INTRADERMAL

## 2016-08-20 MED ORDER — LEVOTHYROXINE SODIUM 100 MCG PO TABS
100.0000 ug | ORAL_TABLET | Freq: Every day | ORAL | Status: DC
  Administered 2016-08-21: 08:00:00 100 ug via ORAL
  Filled 2016-08-20: qty 1

## 2016-08-20 MED ORDER — METOCLOPRAMIDE HCL 5 MG/ML IJ SOLN: 5.0000 mg | Freq: Three times a day (TID) | INTRAMUSCULAR | Status: DC | PRN

## 2016-08-20 MED ORDER — ACETAMINOPHEN 650 MG RE SUPP: 650.0000 mg | Freq: Four times a day (QID) | RECTAL | Status: DC | PRN

## 2016-08-20 MED ORDER — INSULIN ASPART 100 UNIT/ML ~~LOC~~ SOLN
0.0000 [IU] | Freq: Every day | SUBCUTANEOUS | Status: DC
  Administered 2016-08-20: 22:00:00 5 [IU] via SUBCUTANEOUS

## 2016-08-20 MED ORDER — HYDROMORPHONE HCL 1 MG/ML IJ SOLN: 1.0000 mg | INTRAMUSCULAR | Status: DC | PRN

## 2016-08-20 MED ORDER — POLYETHYLENE GLYCOL 3350 17 G PO PACK: 17.0000 g | PACK | Freq: Every day | ORAL | Status: DC | PRN

## 2016-08-20 MED ORDER — BUPIVACAINE IN DEXTROSE 0.75-8.25 % IT SOLN
INTRATHECAL | Status: DC | PRN
  Administered 2016-08-20: 15 mg via INTRATHECAL

## 2016-08-20 MED ORDER — OXYCODONE HCL 5 MG PO TABS: 5.0000 mg | ORAL_TABLET | ORAL | Status: DC | PRN

## 2016-08-20 MED ORDER — MIDAZOLAM HCL 5 MG/5ML IJ SOLN
INTRAMUSCULAR | Status: DC | PRN
  Administered 2016-08-20: 2 mg via INTRAVENOUS

## 2016-08-20 MED ORDER — CHLORHEXIDINE GLUCONATE 4 % EX LIQD: 60.0000 mL | Freq: Once | CUTANEOUS | Status: DC

## 2016-08-20 MED ORDER — DEXAMETHASONE SODIUM PHOSPHATE 10 MG/ML IJ SOLN
INTRAMUSCULAR | Status: AC
  Filled 2016-08-20: qty 1

## 2016-08-20 MED ORDER — LORATADINE 10 MG PO TABS
10.0000 mg | ORAL_TABLET | Freq: Every day | ORAL | Status: DC
  Administered 2016-08-20 – 2016-08-21 (×2): 10 mg via ORAL
  Filled 2016-08-20 (×2): qty 1

## 2016-08-20 MED ORDER — ACETAMINOPHEN 325 MG PO TABS: 650.0000 mg | ORAL_TABLET | Freq: Four times a day (QID) | ORAL | Status: DC | PRN

## 2016-08-20 MED ORDER — MORPHINE SULFATE 15 MG PO TABS
30.0000 mg | ORAL_TABLET | Freq: Two times a day (BID) | ORAL | Status: DC
  Administered 2016-08-20: 30 mg via ORAL
  Filled 2016-08-20: qty 2

## 2016-08-20 MED ORDER — DOCUSATE SODIUM 100 MG PO CAPS
100.0000 mg | ORAL_CAPSULE | Freq: Two times a day (BID) | ORAL | Status: DC
  Administered 2016-08-20 – 2016-08-21 (×2): 100 mg via ORAL
  Filled 2016-08-20 (×2): qty 1

## 2016-08-20 MED ORDER — PROPOFOL 10 MG/ML IV BOLUS
INTRAVENOUS | Status: AC
  Filled 2016-08-20: qty 20

## 2016-08-20 MED ORDER — GABAPENTIN 300 MG PO CAPS
ORAL_CAPSULE | ORAL | Status: AC
  Filled 2016-08-20: qty 2

## 2016-08-20 MED ORDER — SODIUM CHLORIDE 0.9 % IV SOLN
INTRAVENOUS | Status: DC
  Administered 2016-08-20: 14:00:00 via INTRAVENOUS

## 2016-08-20 MED ORDER — DIPHENHYDRAMINE HCL 50 MG/ML IJ SOLN
INTRAMUSCULAR | Status: DC | PRN
  Administered 2016-08-20: 25 mg via INTRAVENOUS

## 2016-08-20 MED ORDER — ONDANSETRON HCL 4 MG PO TABS: 4.0000 mg | ORAL_TABLET | Freq: Four times a day (QID) | ORAL | Status: DC | PRN

## 2016-08-20 MED ORDER — TRANEXAMIC ACID 1000 MG/10ML IV SOLN
1000.0000 mg | INTRAVENOUS | Status: AC
  Administered 2016-08-20: 1000 mg via INTRAVENOUS
  Filled 2016-08-20: qty 1100

## 2016-08-20 MED ORDER — BUPIVACAINE HCL (PF) 0.25 % IJ SOLN
INTRAMUSCULAR | Status: DC | PRN
  Administered 2016-08-20: 20 mL

## 2016-08-20 MED ORDER — PANTOPRAZOLE SODIUM 40 MG PO TBEC
80.0000 mg | DELAYED_RELEASE_TABLET | Freq: Every day | ORAL | Status: DC
  Administered 2016-08-20 – 2016-08-21 (×2): 80 mg via ORAL
  Filled 2016-08-20 (×2): qty 2

## 2016-08-20 MED ORDER — ONDANSETRON HCL 4 MG/2ML IJ SOLN
INTRAMUSCULAR | Status: DC | PRN
  Administered 2016-08-20: 4 mg via INTRAVENOUS

## 2016-08-20 MED ORDER — CELECOXIB 200 MG PO CAPS
200.0000 mg | ORAL_CAPSULE | Freq: Once | ORAL | Status: AC
  Administered 2016-08-20: 200 mg via ORAL

## 2016-08-20 MED ORDER — CELECOXIB 200 MG PO CAPS
ORAL_CAPSULE | ORAL | Status: AC
  Filled 2016-08-20: qty 1

## 2016-08-20 MED ORDER — CLINDAMYCIN PHOSPHATE 900 MG/50ML IV SOLN
900.0000 mg | INTRAVENOUS | Status: AC
  Administered 2016-08-20: 900 mg via INTRAVENOUS

## 2016-08-20 MED ORDER — MORPHINE SULFATE 15 MG PO TABS
30.0000 mg | ORAL_TABLET | Freq: Four times a day (QID) | ORAL | Status: DC | PRN
  Filled 2016-08-20: qty 2

## 2016-08-20 MED ORDER — LIDOCAINE 2% (20 MG/ML) 5 ML SYRINGE
INTRAMUSCULAR | Status: AC
Start: 2016-08-20 — End: 2016-08-20
  Filled 2016-08-20: qty 5

## 2016-08-20 MED ORDER — DIPHENHYDRAMINE HCL 50 MG/ML IJ SOLN
INTRAMUSCULAR | Status: AC
  Filled 2016-08-20: qty 1

## 2016-08-20 MED ORDER — METHOCARBAMOL 500 MG PO TABS
500.0000 mg | ORAL_TABLET | Freq: Four times a day (QID) | ORAL | Status: DC | PRN
  Administered 2016-08-20 – 2016-08-21 (×2): 500 mg via ORAL
  Filled 2016-08-20 (×2): qty 1

## 2016-08-20 MED ORDER — PHENOL 1.4 % MT LIQD
1.0000 | OROMUCOSAL | Status: DC | PRN
Start: 2016-08-20 — End: 2016-08-21

## 2016-08-20 MED ORDER — INSULIN ASPART 100 UNIT/ML ~~LOC~~ SOLN
0.0000 [IU] | Freq: Three times a day (TID) | SUBCUTANEOUS | Status: DC
  Administered 2016-08-20: 8 [IU] via SUBCUTANEOUS

## 2016-08-20 MED ORDER — PROPOFOL 10 MG/ML IV BOLUS
INTRAVENOUS | Status: AC
  Filled 2016-08-20: qty 60

## 2016-08-20 MED ORDER — ACETAMINOPHEN 500 MG PO TABS
ORAL_TABLET | ORAL | Status: AC
  Filled 2016-08-20: qty 2

## 2016-08-20 MED ORDER — LACTATED RINGERS IV SOLN
INTRAVENOUS | Status: DC
  Administered 2016-08-20 (×3): via INTRAVENOUS

## 2016-08-20 MED ORDER — GLIPIZIDE 5 MG PO TABS
2.5000 mg | ORAL_TABLET | Freq: Every day | ORAL | Status: DC
  Administered 2016-08-21: 09:00:00 2.5 mg via ORAL
  Filled 2016-08-20: qty 0.5

## 2016-08-20 MED ORDER — BUPIVACAINE HCL (PF) 0.25 % IJ SOLN
INTRAMUSCULAR | Status: AC
  Filled 2016-08-20: qty 30

## 2016-08-20 MED ORDER — DIPHENHYDRAMINE HCL 12.5 MG/5ML PO ELIX: 12.5000 mg | ORAL_SOLUTION | ORAL | Status: DC | PRN

## 2016-08-20 MED ORDER — ONDANSETRON HCL 4 MG/2ML IJ SOLN
INTRAMUSCULAR | Status: AC
  Filled 2016-08-20: qty 2

## 2016-08-20 MED ORDER — GABAPENTIN 300 MG PO CAPS
600.0000 mg | ORAL_CAPSULE | Freq: Once | ORAL | Status: AC
  Administered 2016-08-20: 600 mg via ORAL

## 2016-08-20 MED ORDER — GLYCOPYRROLATE 0.2 MG/ML IV SOSY
PREFILLED_SYRINGE | INTRAVENOUS | Status: AC
  Filled 2016-08-20: qty 3

## 2016-08-20 MED ORDER — KETOROLAC TROMETHAMINE 15 MG/ML IJ SOLN
7.5000 mg | Freq: Four times a day (QID) | INTRAMUSCULAR | Status: AC
  Administered 2016-08-20 – 2016-08-21 (×4): 7.5 mg via INTRAVENOUS
  Filled 2016-08-20 (×4): qty 1

## 2016-08-20 SURGICAL SUPPLY — 38 items
BAG ZIPLOCK 12X15 (MISCELLANEOUS) IMPLANT
BENZOIN TINCTURE PRP APPL 2/3 (GAUZE/BANDAGES/DRESSINGS) IMPLANT
BLADE SAW SGTL 18X1.27X75 (BLADE) ×2 IMPLANT
BLADE SAW SGTL 18X1.27X75MM (BLADE) ×1
CAPT HIP TOTAL 2 ×3 IMPLANT
CELLS DAT CNTRL 66122 CELL SVR (MISCELLANEOUS) ×1 IMPLANT
CLOSURE WOUND 1/2 X4 (GAUZE/BANDAGES/DRESSINGS)
CLOTH BEACON ORANGE TIMEOUT ST (SAFETY) ×3 IMPLANT
COVER PERINEAL POST (MISCELLANEOUS) ×3 IMPLANT
DRAPE STERI IOBAN 125X83 (DRAPES) ×3 IMPLANT
DRAPE U-SHAPE 47X51 STRL (DRAPES) ×6 IMPLANT
DRSG AQUACEL AG ADV 3.5X10 (GAUZE/BANDAGES/DRESSINGS) ×3 IMPLANT
DURAPREP 26ML APPLICATOR (WOUND CARE) ×3 IMPLANT
ELECT REM PT RETURN 9FT ADLT (ELECTROSURGICAL) ×3
ELECTRODE REM PT RTRN 9FT ADLT (ELECTROSURGICAL) ×1 IMPLANT
GAUZE XEROFORM 1X8 LF (GAUZE/BANDAGES/DRESSINGS) IMPLANT
GLOVE BIO SURGEON STRL SZ7.5 (GLOVE) ×3 IMPLANT
GLOVE BIOGEL PI IND STRL 8 (GLOVE) ×2 IMPLANT
GLOVE BIOGEL PI INDICATOR 8 (GLOVE) ×4
GLOVE ECLIPSE 8.0 STRL XLNG CF (GLOVE) ×3 IMPLANT
GOWN STRL REUS W/TWL XL LVL3 (GOWN DISPOSABLE) ×6 IMPLANT
HANDPIECE INTERPULSE COAX TIP (DISPOSABLE) ×2
HOLDER FOLEY CATH W/STRAP (MISCELLANEOUS) ×3 IMPLANT
NS IRRIG 1000ML POUR BTL (IV SOLUTION) ×3 IMPLANT
PACK ANTERIOR HIP CUSTOM (KITS) ×3 IMPLANT
RTRCTR WOUND ALEXIS 18CM MED (MISCELLANEOUS) ×3
SET HNDPC FAN SPRY TIP SCT (DISPOSABLE) ×1 IMPLANT
STAPLER VISISTAT 35W (STAPLE) IMPLANT
STRIP CLOSURE SKIN 1/2X4 (GAUZE/BANDAGES/DRESSINGS) IMPLANT
SUT ETHIBOND NAB CT1 #1 30IN (SUTURE) ×3 IMPLANT
SUT MNCRL AB 4-0 PS2 18 (SUTURE) IMPLANT
SUT VIC AB 0 CT1 36 (SUTURE) ×3 IMPLANT
SUT VIC AB 1 CT1 36 (SUTURE) ×3 IMPLANT
SUT VIC AB 2-0 CT1 27 (SUTURE) ×4
SUT VIC AB 2-0 CT1 TAPERPNT 27 (SUTURE) ×2 IMPLANT
TRAY FOLEY W/METER SILVER 16FR (SET/KITS/TRAYS/PACK) ×3 IMPLANT
WATER STERILE IRR 1000ML POUR (IV SOLUTION) ×6 IMPLANT
YANKAUER SUCT BULB TIP 10FT TU (MISCELLANEOUS) ×3 IMPLANT

## 2016-08-20 NOTE — Transfer of Care (Signed)
Immediate Anesthesia Transfer of Care Note  Patient: Colin Ramirez  Procedure(s) Performed: Procedure(s): LEFT TOTAL HIP ARTHROPLASTY ANTERIOR APPROACH (Left)  Patient Location: PACU  Anesthesia Type:Spinal  Level of Consciousness: awake, alert , oriented and patient cooperative  Airway & Oxygen Therapy: Patient Spontanous Breathing and Patient connected to face mask oxygen  Post-op Assessment: Report given to RN and Post -op Vital signs reviewed and stable  Post vital signs: stable  Last Vitals:  Vitals:   08/20/16 0823  BP: (!) 145/92  Pulse: 82  Resp: 18  Temp: 36.7 C    Last Pain:  Vitals:   08/20/16 0823  TempSrc: Oral  PainSc:       Patients Stated Pain Goal: 5 ([id number])  Complications: No apparent anesthesia complications

## 2016-08-20 NOTE — Anesthesia Preprocedure Evaluation (Addendum)
Anesthesia Evaluation  Patient identified by MRN, date of birth, ID band Patient awake    Reviewed: Allergy & Precautions, H&P , NPO status , Patient's Chart, lab work & pertinent test results  Airway Mallampati: III  TM Distance: >3 FB Neck ROM: Full    Dental no notable dental hx. (+) Teeth Intact, Dental Advisory Given   Pulmonary sleep apnea and Continuous Positive Airway Pressure Ventilation , Current Smoker,    Pulmonary exam normal breath sounds clear to auscultation       Cardiovascular hypertension, Pt. on medications  Rhythm:Regular Rate:Normal     Neuro/Psych negative neurological ROS  negative psych ROS   GI/Hepatic Neg liver ROS, GERD  Medicated and Controlled,  Endo/Other  diabetes, Type 2, Oral Hypoglycemic AgentsHypothyroidism Morbid obesity  Renal/GU negative Renal ROS  negative genitourinary   Musculoskeletal  (+) Arthritis , Osteoarthritis,    Abdominal   Peds  Hematology negative hematology ROS (+)   Anesthesia Other Findings   Reproductive/Obstetrics negative OB ROS                            Anesthesia Physical Anesthesia Plan  ASA: III  Anesthesia Plan: Spinal   Post-op Pain Management:    Induction: Intravenous  Airway Management Planned: Simple Face Mask  Additional Equipment:   Intra-op Plan:   Post-operative Plan: Extubation in OR  Informed Consent: I have reviewed the patients History and Physical, chart, labs and discussed the procedure including the risks, benefits and alternatives for the proposed anesthesia with the patient or authorized representative who has indicated his/her understanding and acceptance.   Dental advisory given  Plan Discussed with: CRNA  Anesthesia Plan Comments:         Anesthesia Quick Evaluation

## 2016-08-20 NOTE — Evaluation (Signed)
Physical Therapy Evaluation Patient Details Name: Colin Ramirez MRN: [id number] DOB: 1966-01-25 Today's Date: 08/20/2016   History of Present Illness  Pt s/p L THR  Clinical Impression  Pt s/p L THR and presents with decreased L LE strength/ROM and post op pain limiting functional mobility.  Pt should progress to dc home with family assist.    Follow Up Recommendations Home health PT    Equipment Recommendations  Rolling walker with 5" wheels    Recommendations for Other Services OT consult     Precautions / Restrictions Precautions Precautions: Fall Restrictions Weight Bearing Restrictions: No Other Position/Activity Restrictions: WBAT      Mobility  Bed Mobility Overal bed mobility: Needs Assistance Bed Mobility: Supine to Sit     Supine to sit: Min assist     General bed mobility comments: cues for sequence and use of R LE to self assist  Transfers Overall transfer level: Needs assistance Equipment used: Rolling walker (2 wheeled) Transfers: Sit to/from Stand Sit to Stand: Min assist         General transfer comment: cues for LE management and use of UEs to self assist  Ambulation/Gait Ambulation/Gait assistance: Min assist Ambulation Distance (Feet): 190 Feet Assistive device: Rolling walker (2 wheeled) Gait Pattern/deviations: Step-to pattern;Step-through pattern;Decreased step length - right;Decreased step length - left;Shuffle;Trunk flexed Gait velocity: decr   General Gait Details: cues for posture, position from RW and initial sequence  Stairs            Wheelchair Mobility    Modified Rankin (Stroke Patients Only)       Balance                                             Pertinent Vitals/Pain Pain Assessment: 0-10 Pain Score: 3  Pain Location: L hip/thigh Pain Descriptors / Indicators: Aching;Sore Pain Intervention(s): Limited activity within patient's tolerance;Monitored during session;Premedicated  before session;Ice applied    Home Living Family/patient expects to be discharged to:: Private residence Living Arrangements: Children;Spouse/significant other Available Help at Discharge: Family Type of Home: House Home Access: Stairs to enter Entrance Stairs-Rails: None Technical brewer of Steps: 3 Home Layout: One level Home Equipment: Crutches      Prior Function Level of Independence: Independent               Hand Dominance        Extremity/Trunk Assessment   Upper Extremity Assessment Upper Extremity Assessment: Overall WFL for tasks assessed    Lower Extremity Assessment Lower Extremity Assessment: LLE deficits/detail       Communication   Communication: No difficulties  Cognition Arousal/Alertness: Awake/alert Behavior During Therapy: WFL for tasks assessed/performed Overall Cognitive Status: Within Functional Limits for tasks assessed                      General Comments      Exercises     Assessment/Plan    PT Assessment Patient needs continued PT services  PT Problem List Decreased strength;Decreased range of motion;Decreased activity tolerance;Decreased mobility;Decreased knowledge of use of DME;Pain;Obesity       PT Treatment Interventions DME instruction;Gait training;Stair training;Functional mobility training;Therapeutic activities;Therapeutic exercise;Patient/family education    PT Goals (Current goals can be found in the Care Plan section)  Acute Rehab PT Goals Patient Stated Goal: Regain IND PT Goal Formulation: With patient Time For  Goal Achievement: 08/24/16 Potential to Achieve Goals: Good    Frequency 7X/week   Barriers to discharge        Co-evaluation               End of Session Equipment Utilized During Treatment: Gait belt Activity Tolerance: Patient tolerated treatment well Patient left: in chair;with call bell/phone within reach;with chair alarm set Nurse Communication: Mobility  status PT Visit Diagnosis: Difficulty in walking, not elsewhere classified (R26.2)         Time: [id number] PT Time Calculation (min) (ACUTE ONLY): 20 min   Charges:   PT Evaluation $PT Eval Low Complexity: 1 Procedure     PT G Codes:         Colin Ramirez 08/20/2016, 5:14 PM

## 2016-08-20 NOTE — Brief Op Note (Signed)
08/20/2016  11:24 AM  PATIENT:  Colin Ramirez  51 y.o. male  PRE-OPERATIVE DIAGNOSIS:  left hip osteoarthritis  POST-OPERATIVE DIAGNOSIS:  left hip osteoarthritis  PROCEDURE:  Procedure(s): LEFT TOTAL HIP ARTHROPLASTY ANTERIOR APPROACH (Left)  SURGEON:  Surgeon(s) and Role:    * Mcarthur Rossetti, MD - Primary  PHYSICIAN ASSISTANT: Benita Stabile, PA-C  ANESTHESIA:   spinal  EBL:  Total I/O In: 1000 [I.V.:1000] Out: 250 [Blood:250]  COUNTS:  YES  DICTATION: .Other Dictation: Dictation Number [id number]  PLAN OF CARE: Admit to inpatient   PATIENT DISPOSITION:  PACU - hemodynamically stable.   Delay start of Pharmacological VTE agent (>24hrs) due to surgical blood loss or risk of bleeding: no

## 2016-08-20 NOTE — Op Note (Signed)
Colin Ramirez, Colin Ramirez             ACCOUNT NO.:  [id number]  MEDICAL RECORD NO.:  [id number]  LOCATION:  PERIO                        FACILITY:  [facility]  PHYSICIAN:  Colin Ramirez, M.D.DATE OF BIRTH:  17-Feb-1966  DATE OF PROCEDURE:  08/20/2016 DATE OF DISCHARGE:                              OPERATIVE REPORT   PREOPERATIVE DIAGNOSES:  Primary osteoarthritis and degenerative joint disease, left hip.  POSTOPERATIVE DIAGNOSES:  Primary osteoarthritis and degenerative joint disease, left hip.  PROCEDURE:  Left total hip arthroplasty through direct anterior approach.  IMPLANTS:  DePuy Sector Gription acetabular component size 58; size 36+, 4 polyethylene liner; size 11 Corail femoral component with standard offset; size 36+, 5 ceramic hip ball. graft master acetabular component size 58, size 36+ 4 neutral polyethylene liner, size 11 Corail femoral component with varus offset (KLA), size 36+ 5 ceramic hip ball.  SURGEON:  Colin Ramirez. Ninfa Ramirez, M.D.  ASSISTANT:  Colin Emery, PA-C.  ANESTHESIA:  Spinal.  ANTIBIOTICS:  900 mg of IV clindamycin.  BLOOD LOSS:  250 mL.  COMPLICATIONS:  None.  INDICATIONS:  Colin Ramirez is a very pleasant 51 year old gentleman with debilitating arthritis involving his left hip.  His pain is daily and it has detrimentally affected his activities of daily living, his quality of life, and his mobility.  His x-ray showed complete loss of superior lateral joint space with femoral acetabular impingement as well.  He has tried and failed all forms of conservative treatment.  He understands the goals of surgery, decreased pain, improved mobility, and overall improved quality of life.  He understands the risk of acute blood loss anemia, nerve and vessel injury, fracture, infection, dislocation, and DVT.  PROCEDURE DESCRIPTION:  After informed consent was obtained, appropriate left hip was marked.  He was brought to the operating room where  spinal anesthesia was obtained, while he was on the stretcher.  He was then laid in a supine position on the stretcher.  A Foley catheter was placed.  Then, both feet had traction boots applied to them.  Next, he was placed supine on the Hana fracture table with the perineal post in place and both legs in inline skeletal traction devices, but no traction applied.  His left operative hip was then prepped and draped with DuraPrep and sterile drapes.  Time-out was called and he was identified as correct patient and correct left hip.  I then made an incision just inferior and posterior to the anterior superior iliac spine and carried this obliquely down the leg.  We dissected down the tensor fascia lata muscle.  The tensor fascia was then divided longitudinally to proceed with a direct anterior approach to the hip.  We identified and cauterized the circumflex vessels and then identified the hip capsule. I opened up the hip capsule in L-type format finding a large joint effusion and we did a find a paralabral cyst as well.  We placed Cobra retractors around the medial and lateral femoral neck, and then made our femoral neck cut with an oscillating saw and completed this with an osteotome.  I placed a corkscrew guide in the femoral head and removed the femoral head its entirety and found it to be completely devoid  of cartilage.  We then cleaned the acetabular remnants of the acetabular labrum.  I placed a bent Hohmann over the medial acetabular rim.  We began reaming from a size 43 reamer, but jumped all the way up into the 50s and we went all the way to a 58 reamer.  All reamers were placed under direct visualization and the last reamer under direct fluoroscopy, so we could obtain our depth of reaming, our inclination and anteversion.  Once I was pleased with this, I placed a DePuy Sector Gription acetabular component size 58 and a 36+ 4 neutral polyethylene liner for that size acetabular  component.  Attention was then turned to the femur, with the leg externally rotated to 120 degrees, extended and adducted, we were able to place a Mueller retractor medially and a Hohmann retractor behind the greater trochanter.  We released the lateral joint capsule and used a box cutting osteotome in the inner femoral canal and a rongeur to lateralize.  I then began broaching from a size 8 broach using the Corail broaching system up to a size 11.  With the size 11 in place, we trialed a varus offset femoral neck and a 36+ 1.5 hip ball.  We brought the leg back over and up, with traction and internal rotation reducing the pelvis, we were pleased with stability, but we did feel like he needed just a touch more leg length in offset. We removed the trial components and dislocated the hip.  We then placed the real Corail femoral component with varus offset #size 11 and the real 36+ 5 ceramic hip ball reduced this in the acetabulum, where we were pleased with leg length, offset, stability and range of motion.  We then irrigated the soft tissues with normal saline solution using pulsatile lavage.  We placed a mixture of Marcaine Exparel and saline around the joint capsule.  We then closed the joint capsule with interrupted #1 Ethibond suture, followed by running #1 Vicryl in the tensor fascia, 0 Vicryl in the deep tissue, 2-0 Vicryl subcutaneous tissue, and interrupted staples on the skin.  Xeroform and Aquacel dressing were applied.  He was taken off the Hana table, taken to the recovery room in stable condition.  All final counts were correct. There were no complications noted.  Of note, Colin Emery, PA-C, assisted in the entire case.  His assistance was crucial for facilitating all aspects of this case.     Colin Ramirez, M.D.     CYB/MEDQ  D:  08/20/2016  T:  08/20/2016  Job:  [id number]

## 2016-08-20 NOTE — Anesthesia Procedure Notes (Signed)
Spinal  Patient location during procedure: OR Start time: 08/20/2016 10:06 AM End time: 08/20/2016 10:09 AM Staffing Anesthesiologist: Roderic Palau Performed: anesthesiologist  Preanesthetic Checklist Completed: patient identified, surgical consent, pre-op evaluation, timeout performed, IV checked, risks and benefits discussed and monitors and equipment checked Spinal Block Patient position: sitting Prep: DuraPrep Patient monitoring: cardiac monitor, continuous pulse ox and blood pressure Approach: midline Location: L3-4 Injection technique: single-shot Needle Needle type: Pencan  Needle gauge: 24 G Needle length: 9 cm Assessment Sensory level: T8 Additional Notes Functioning IV was confirmed and monitors were applied. Sterile prep and drape, including hand hygiene and sterile gloves were used. The patient was positioned and the spine was prepped. The skin was anesthetized with lidocaine.  Free flow of clear CSF was obtained prior to injecting local anesthetic into the CSF.  The spinal needle aspirated freely following injection.  The needle was carefully withdrawn.  The patient tolerated the procedure well. CRNA made a couple of attempts before me. I was successful a level below.
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
## 2016-08-20 NOTE — Anesthesia Postprocedure Evaluation (Signed)
Anesthesia Post Note  Patient: Colin Ramirez  Procedure(s) Performed: Procedure(s) (LRB): LEFT TOTAL HIP ARTHROPLASTY ANTERIOR APPROACH (Left)  Patient location during evaluation: PACU Anesthesia Type: Spinal Level of consciousness: awake and alert Pain management: pain level controlled Vital Signs Assessment: post-procedure vital signs reviewed and stable Respiratory status: spontaneous breathing and respiratory function stable Cardiovascular status: blood pressure returned to baseline and stable Postop Assessment: spinal receding Anesthetic complications: no       Last Vitals:  Vitals:   08/20/16 1230 08/20/16 1254  BP: 110/80 121/79  Pulse: 65 65  Resp: 13 18  Temp: (!) 36.1 C 36.6 C    Last Pain:  Vitals:   08/20/16 1230  TempSrc:   PainSc: 0-No pain                 Ray Glacken,W. EDMOND

## 2016-08-21 LAB — BASIC METABOLIC PANEL
ANION GAP: 8 (ref 5–15)
BUN: 20 mg/dL (ref 6–20)
CHLORIDE: 105 mmol/L (ref 101–111)
CO2: 27 mmol/L (ref 22–32)
Calcium: 9 mg/dL (ref 8.9–10.3)
Creatinine, Ser: 0.69 mg/dL (ref 0.61–1.24)
GFR calc Af Amer: >60 mL/min (ref >60)
GLUCOSE: 265 mg/dL — AB (ref 65–99)
POTASSIUM: 4 mmol/L (ref 3.5–5.1)
Sodium: 140 mmol/L (ref 135–145)

## 2016-08-21 LAB — CBC
HCT: 38.4 % — ABNORMAL LOW (ref 39.0–52.0)
HEMOGLOBIN: 12.9 g/dL — AB (ref 13.0–17.0)
MCH: 29.3 pg (ref 26.0–34.0)
MCHC: 33.6 g/dL (ref 30.0–36.0)
MCV: 87.3 fL (ref 78.0–100.0)
PLATELETS: 268 10*3/uL (ref 150–400)
RBC: 4.4 MIL/uL (ref 4.22–5.81)
RDW: 14.1 % (ref 11.5–15.5)
WBC: 14.6 10*3/uL — AB (ref 4.0–10.5)

## 2016-08-21 LAB — GLUCOSE, CAPILLARY
GLUCOSE-CAPILLARY: 225 mg/dL — AB (ref 65–99)
Glucose-Capillary: 272 mg/dL — ABNORMAL HIGH (ref 65–99)
Glucose-Capillary: 380 mg/dL — ABNORMAL HIGH (ref 65–99)

## 2016-08-21 MED ORDER — INSULIN ASPART 100 UNIT/ML ~~LOC~~ SOLN
0.0000 [IU] | Freq: Every day | SUBCUTANEOUS | Status: DC
Start: 2016-08-21 — End: 2016-08-21

## 2016-08-21 MED ORDER — INSULIN ASPART 100 UNIT/ML ~~LOC~~ SOLN
0.0000 [IU] | Freq: Three times a day (TID) | SUBCUTANEOUS | Status: DC
  Administered 2016-08-21: 7 [IU] via SUBCUTANEOUS
  Administered 2016-08-21: 11 [IU] via SUBCUTANEOUS

## 2016-08-21 NOTE — Progress Notes (Signed)
Physical Therapy Treatment Patient Details Name: Colin Ramirez MRN: [id number] DOB: [birth year] Today's Date: 08/21/2016    History of Present Illness Pt s/p L THR    PT Comments    Pt progressing well with mobility and eager for return home.  Reviewed stairs and car transfers with pt and spouse.   Follow Up Recommendations  Home health PT     Equipment Recommendations  Rolling walker with 5" wheels    Recommendations for Other Services OT consult     Precautions / Restrictions Precautions Precautions: Fall Restrictions Weight Bearing Restrictions: No Other Position/Activity Restrictions: WBAT    Mobility  Bed Mobility               General bed mobility comments: up in chair  Transfers Overall transfer level: Needs assistance Equipment used: Rolling walker (2 wheeled) Transfers: Sit to/from Stand Sit to Stand: Min guard;Supervision         General transfer comment: cues for use of UEs to self assist  Ambulation/Gait Ambulation/Gait assistance: Min guard;Supervision Ambulation Distance (Feet): 50 Feet Assistive device: Rolling walker (2 wheeled) Gait Pattern/deviations: Step-to pattern;Step-through pattern;Decreased step length - right;Decreased step length - left;Shuffle;Trunk flexed Gait velocity: decr Gait velocity interpretation: Below normal speed for age/gender General Gait Details: min cues for posture and position from RW   Stairs Stairs: Yes   Stair Management: No rails;Step to pattern;Forwards;With crutches Number of Stairs: 4 General stair comments: cues for sequence and foot/crutch placement.  Spouse assisting and written instructions provided  Wheelchair Mobility    Modified Rankin (Stroke Patients Only)       Balance                                    Cognition Arousal/Alertness: Awake/alert Behavior During Therapy: WFL for tasks assessed/performed Overall Cognitive Status: Within Functional Limits for  tasks assessed                      Exercises      General Comments        Pertinent Vitals/Pain Pain Assessment: Faces Faces Pain Scale: Hurts a little bit Pain Location: L hip/thigh Pain Descriptors / Indicators: Aching;Sore Pain Intervention(s): Limited activity within patient's tolerance;Monitored during session    Home Living                      Prior Function            PT Goals (current goals can now be found in the care plan section) Acute Rehab PT Goals Patient Stated Goal: Regain IND PT Goal Formulation: With patient Time For Goal Achievement: 08/24/16 Potential to Achieve Goals: Good Progress towards PT goals: Progressing toward goals    Frequency    7X/week      PT Plan Current plan remains appropriate    Co-evaluation             End of Session Equipment Utilized During Treatment: Gait belt Activity Tolerance: Patient tolerated treatment well Patient left: in chair;with call bell/phone within reach;with family/visitor present Nurse Communication: Mobility status PT Visit Diagnosis: Difficulty in walking, not elsewhere classified (R26.2)     Time: 1342-1400 PT Time Calculation (min) (ACUTE ONLY): 18 min  Charges:  $Gait Training: 8-22 mins                    G Codes:  Donae Kueker 08/21/2016, 4:27 PM

## 2016-08-21 NOTE — Progress Notes (Signed)
Discharged from floor via w/c for transport home by car. Belongings & family with pt. No changes in assessment. Phoenix Riesen  

## 2016-08-21 NOTE — Evaluation (Signed)
Occupational Therapy Evaluation Patient Details Name: Colin Ramirez MRN: [id number] DOB: [birth year] Today's Date: 08/21/2016    History of Present Illness Pt s/p L THR   Clinical Impression   Pt admitted with the above diagnoses and presents with below problem list. Pt will benefit from continued acute OT to address the below listed deficits and maximize independence with basic ADLs prior to d/c home. PTA pt was mod I to independent with ADLs. Pt is currently min guard with functional mobility and transfers; min A for LB ADLs. ADL education provided to pt.       Follow Up Recommendations  No OT follow up    Equipment Recommendations  3 in 1 bedside commode    Recommendations for Other Services       Precautions / Restrictions Precautions Precautions: Fall Restrictions Weight Bearing Restrictions: No Other Position/Activity Restrictions: WBAT      Mobility Bed Mobility   General bed mobility comments: up in chair  Transfers Overall transfer level: Needs assistance Equipment used: Rolling walker (2 wheeled) Transfers: Sit to/from Stand Sit to Stand: Min guard         General transfer comment: from recliner and 3n1    Balance                                            ADL Overall ADL's : Needs assistance/impaired Eating/Feeding: Set up;Sitting   Grooming: Min guard;Standing;Supervision/safety   Upper Body Bathing: Set up;Sitting   Lower Body Bathing: Minimal assistance;Sit to/from stand   Upper Body Dressing : Set up;Sitting   Lower Body Dressing: Minimal assistance;Sit to/from stand   Toilet Transfer: Min guard;Ambulation;RW Toilet Transfer Details (indicate cue type and reason): 3n1 over toilet Toileting- Clothing Manipulation and Hygiene: Supervision/safety;Min guard;Sit to/from stand   Tub/ Shower Transfer: Tub transfer;Minimal assistance;Ambulation;3 in 1;Rolling walker   Functional mobility during ADLs: Min guard;Rolling  walker General ADL Comments: Pt completed toilet transfer and household distance functional mobility. Educated on AE for LB ADLs. Provided shower transfer to 3n1 handout. Difficulty accessing feet in seated position.      Vision         Perception     Praxis      Pertinent Vitals/Pain Pain Assessment: Faces Faces Pain Scale: Hurts a little bit Pain Location: L hip/thigh Pain Descriptors / Indicators: Aching;Sore Pain Intervention(s): Limited activity within patient's tolerance;Monitored during session;Repositioned     Hand Dominance     Extremity/Trunk Assessment Upper Extremity Assessment Upper Extremity Assessment: Overall WFL for tasks assessed   Lower Extremity Assessment Lower Extremity Assessment: Defer to PT evaluation       Communication Communication Communication: No difficulties   Cognition Arousal/Alertness: Awake/alert Behavior During Therapy: WFL for tasks assessed/performed Overall Cognitive Status: Within Functional Limits for tasks assessed                     General Comments       Exercises Exercises: Total Joint     Shoulder Instructions      Home Living Family/patient expects to be discharged to:: Private residence Living Arrangements: Children;Spouse/significant other Available Help at Discharge: Family Type of Home: House Home Access: Stairs to enter Technical brewer of Steps: 3 Entrance Stairs-Rails: None Home Layout: One level     Bathroom Shower/Tub: Tub/shower unit Shower/tub characteristics: [place]  Prior Functioning/Environment Level of Independence: Independent                 OT Problem List: Impaired balance (sitting and/or standing);Decreased knowledge of use of DME or AE;Decreased knowledge of precautions;Pain      OT Treatment/Interventions: Self-care/ADL training;DME and/or AE instruction;Therapeutic activities;Patient/family education;Balance  training    OT Goals(Current goals can be found in the care plan section) Acute Rehab OT Goals Patient Stated Goal: Regain IND OT Goal Formulation: With patient Time For Goal Achievement: 08/28/16 Potential to Achieve Goals: Good ADL Goals Pt Will Perform Lower Body Bathing: with adaptive equipment Pt Will Perform Lower Body Dressing: with modified independence;with adaptive equipment Pt Will Perform Tub/Shower Transfer: with supervision;Tub transfer;ambulating;3 in 1;rolling walker  OT Frequency: Min 1X/week   Barriers to D/C:            Co-evaluation              End of Session Equipment Utilized During Treatment: Rolling walker  Activity Tolerance: Patient tolerated treatment well Patient left: in chair;with call bell/phone within reach  OT Visit Diagnosis: Other abnormalities of gait and mobility (R26.89);Pain Pain - Right/Left: Left Pain - part of body: Hip                ADL either performed or assessed with clinical judgement  Time: 1123-1131 OT Time Calculation (min): 8 min Charges:  OT General Charges $OT Visit: 1 Procedure OT Evaluation $OT Eval Low Complexity: 1 Procedure G-Codes:       Hortencia Pilar 08/21/2016, 11:56 AM

## 2016-08-21 NOTE — Progress Notes (Signed)
Physical Therapy Treatment Patient Details Name: Colin Ramirez MRN: [id number] DOB: December 15, 1965 Today's Date: 08/21/2016    History of Present Illness Pt s/p L THR    PT Comments    Pt very motivated and progressing with mobility.  Therex program initiated this am.   Follow Up Recommendations  Home health PT     Equipment Recommendations  Rolling walker with 5" wheels    Recommendations for Other Services OT consult     Precautions / Restrictions Precautions Precautions: Fall Restrictions Weight Bearing Restrictions: No Other Position/Activity Restrictions: WBAT    Mobility  Bed Mobility Overal bed mobility: Needs Assistance Bed Mobility: Sit to Supine       Sit to supine: Min assist   General bed mobility comments: cues for sequence and use of R LE to self assist  Transfers Overall transfer level: Needs assistance Equipment used: Rolling walker (2 wheeled) Transfers: Sit to/from Stand Sit to Stand: Min guard         General transfer comment: cues for LE management and use of UEs to self assist  Ambulation/Gait Ambulation/Gait assistance: Min guard Ambulation Distance (Feet): 200 Feet Assistive device: Rolling walker (2 wheeled) Gait Pattern/deviations: Step-to pattern;Step-through pattern;Decreased step length - right;Decreased step length - left;Shuffle;Trunk flexed Gait velocity: decr Gait velocity interpretation: Below normal speed for age/gender General Gait Details: cues for posture, position from RW and initial sequence   Stairs            Wheelchair Mobility    Modified Rankin (Stroke Patients Only)       Balance                                    Cognition Arousal/Alertness: Awake/alert Behavior During Therapy: WFL for tasks assessed/performed Overall Cognitive Status: Within Functional Limits for tasks assessed                      Exercises Total Joint Exercises Ankle Circles/Pumps:  AROM;Both;15 reps;Supine Quad Sets: AROM;Both;10 reps;Supine Heel Slides: AAROM;Left;20 reps;Supine Hip ABduction/ADduction: AAROM;Left;15 reps;Supine    General Comments        Pertinent Vitals/Pain Pain Assessment: Faces Faces Pain Scale: Hurts little more Pain Location: L hip/thigh Pain Descriptors / Indicators: Aching;Sore Pain Intervention(s): Limited activity within patient's tolerance;Monitored during session;Premedicated before session;Ice applied    Home Living                      Prior Function            PT Goals (current goals can now be found in the care plan section) Acute Rehab PT Goals Patient Stated Goal: Regain IND PT Goal Formulation: With patient Time For Goal Achievement: 08/24/16 Potential to Achieve Goals: Good Progress towards PT goals: Progressing toward goals    Frequency    7X/week      PT Plan Current plan remains appropriate    Co-evaluation             End of Session Equipment Utilized During Treatment: Gait belt Activity Tolerance: Patient tolerated treatment well Patient left: in bed;with call bell/phone within reach Nurse Communication: Mobility status PT Visit Diagnosis: Difficulty in walking, not elsewhere classified (R26.2)     Time: [id number] PT Time Calculation (min) (ACUTE ONLY): 23 min  Charges:  $Gait Training: 8-22 mins $Therapeutic Exercise: 8-22 mins  G Codes:       Jyl Chico 2016/08/23, 11:45 AM
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
## 2016-08-21 NOTE — Progress Notes (Signed)
   Subjective:  Patient reports pain as moderate.  No events.  Objective:   VITALS:   Vitals:   08/20/16 2123 08/20/16 2154 08/21/16 0145 08/21/16 0555  BP:  123/78 (!) 142/87 124/83  Pulse: 88 84 74 70  Resp:  16 16 16   Temp:  98.3 F (36.8 C) 98.2 F (36.8 C) 98.2 F (36.8 C)  TempSrc:  Oral Oral Oral  SpO2: 95% 96% 97% 95%  Weight:      Height:        Neurologically intact Neurovascular intact Sensation intact distally Intact pulses distally Dorsiflexion/Plantar flexion intact Incision: dressing C/D/I and no drainage No cellulitis present Compartment soft   Lab Results  Component Value Date   WBC 14.6 (H) 08/21/2016   HGB 12.9 (L) 08/21/2016   HCT 38.4 (L) 08/21/2016   MCV 87.3 08/21/2016   PLT 268 08/21/2016     Assessment/Plan:  1 Day Post-Op   - Expected postop acute blood loss anemia - will monitor for symptoms - Up with PT/OT - DVT ppx - SCDs, ambulation, aspirin - WBAT operative extremity - Pain control - Discharge planning  Eduard Roux 08/21/2016, 8:08 AM [phone]

## 2016-08-21 NOTE — Discharge Instructions (Signed)

## 2016-08-22 ENCOUNTER — Other Ambulatory Visit (INDEPENDENT_AMBULATORY_CARE_PROVIDER_SITE_OTHER): Payer: Self-pay | Admitting: Orthopaedic Surgery

## 2016-08-22 MED ORDER — METHOCARBAMOL 500 MG PO TABS: 500.0000 mg | ORAL_TABLET | Freq: Four times a day (QID) | ORAL | 2 refills | Status: DC | PRN

## 2016-08-22 NOTE — Care Management Note (Signed)
Case Management Note  Patient Details  Name: Colin Ramirez MRN: [id number] Date of Birth: Dec 07, 1965  Subjective/Objective:      S/p L THR              Action/Plan: Discharge Planning: AVS reviewed: 08/21/2016 0900 NCM spoke to pt and requesting RW and 3n1 for home. Pt preoperatively arranged with [facility] for [facility]. Pt agreeable to [facility]. Contacted [facility] for DME for home to be delivered to room prior to dc.   PCP Tenna Delaine D   Expected Discharge Date:  08/21/16               Expected Discharge Plan:  [place]  In-House Referral:  NA  Discharge planning Services  CM Consult  Post Acute Care Choice:  Home Health Choice offered to:  Patient  DME Arranged:  3-N-1, Walker rolling DME Agency:  [place]:  PT [place] Agency:  [facility] (formerly [facility])  Status of Service:  Completed, signed off  If discussed at [employer] of Stay Meetings, dates discussed:    Additional Comments:  Erenest Rasher, RN 08/22/2016, 1:11 PM

## 2016-09-02 ENCOUNTER — Ambulatory Visit (INDEPENDENT_AMBULATORY_CARE_PROVIDER_SITE_OTHER): Payer: Medicare Other | Admitting: Orthopaedic Surgery

## 2016-09-02 DIAGNOSIS — Z96642 Presence of left artificial hip joint: Secondary | ICD-10-CM

## 2016-09-02 NOTE — Progress Notes (Signed)
The patient is 2 weeks status post a left total hip arthroplasty. He is doing well. He has no complaints. He did not take aspirin instead he took [facility] powders. He said he is doing well.  On examination his calf on the left side a soft. Incisions well-healed. I removed the staples and placed Steri-Strips. He did have a moderate seroma that drained and got about 50 mL of fluid off his hip. His leg was are equal. He seems to be getting around well.  At this point continue increase his activities as he tolerates. He can drive as soon as is comfortable doing so. We'll see him back in a month to see how is doing overall but no x-rays are needed.

## 2016-09-13 ENCOUNTER — Ambulatory Visit (INDEPENDENT_AMBULATORY_CARE_PROVIDER_SITE_OTHER): Payer: Medicare Other | Admitting: Orthopaedic Surgery

## 2016-09-13 DIAGNOSIS — Z96642 Presence of left artificial hip joint: Secondary | ICD-10-CM

## 2016-09-13 NOTE — Progress Notes (Signed)
The patient is almost 4 weeks status post a left total hip arthroplasty through direct anterior approach. He is feeling a little under the weather and we'll make sure there was nothing going on with his hip. He has a type II diabetic but under excellent control. Postoperatively had his first visit we did remove a seroma from his hip of about 50 mL.  On examination today his left hip incision is healed over nicely. There is no significant warmth and there is definitely no redness. There is no drainage. There is a small seroma and I was able to aspirate 30 mL of seroma fluid from the hip. This gave him reassurance and made him feel better he said already. I told her my concern about infection at all. He is not walking with an assistive device. He is done with his aspirin and does not take any pain medicine either. We'll see him back in a month and see how is doing overall but no x-rays are needed.

## 2016-09-20 ENCOUNTER — Other Ambulatory Visit: Payer: Self-pay | Admitting: Physician Assistant

## 2016-09-20 DIAGNOSIS — E785 Hyperlipidemia, unspecified: Secondary | ICD-10-CM

## 2016-09-22 ENCOUNTER — Ambulatory Visit: Payer: Medicare Other | Admitting: Physician Assistant

## 2016-10-05 ENCOUNTER — Ambulatory Visit (INDEPENDENT_AMBULATORY_CARE_PROVIDER_SITE_OTHER): Payer: Medicare Other | Admitting: Orthopaedic Surgery

## 2016-10-11 ENCOUNTER — Ambulatory Visit (INDEPENDENT_AMBULATORY_CARE_PROVIDER_SITE_OTHER): Payer: Medicare Other | Admitting: Orthopaedic Surgery

## 2016-10-11 DIAGNOSIS — Z96642 Presence of left artificial hip joint: Secondary | ICD-10-CM

## 2016-10-11 NOTE — Progress Notes (Signed)
Colin Ramirez is now about 7 weeks status post a left total hip arthroplasty through direct anterior approach. He has no complaints and is doing well.  On examination he has fluid range of motion of his left hip. His seroma has resolved.  At this point he keep increasing activities as comfort allows. We don't really see him back for 6 months. I'll like a low AP pelvis at that visit.

## 2016-11-03 ENCOUNTER — Other Ambulatory Visit: Payer: Self-pay | Admitting: Physician Assistant

## 2016-11-03 DIAGNOSIS — R748 Abnormal levels of other serum enzymes: Secondary | ICD-10-CM

## 2016-11-15 ENCOUNTER — Encounter (HOSPITAL_COMMUNITY): Payer: Self-pay | Admitting: Family Medicine

## 2016-11-15 ENCOUNTER — Ambulatory Visit (HOSPITAL_COMMUNITY)
Admission: EM | Admit: 2016-11-15 | Discharge: 2016-11-15 | Disposition: A | Discharge status: Home / Self Care | Payer: 59 | Attending: Family Medicine | Admitting: Family Medicine

## 2016-11-15 DIAGNOSIS — J40 Bronchitis, not specified as acute or chronic: Secondary | ICD-10-CM | POA: Diagnosis not present

## 2016-11-15 MED ORDER — AZITHROMYCIN 250 MG PO TABS: 250.0000 mg | ORAL_TABLET | Freq: Every day | ORAL | 0 refills | Status: DC

## 2016-11-15 NOTE — ED Triage Notes (Signed)
Productive   Cough     X  4  Days      Eye pressure         Has   Congestion  As   Well
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
## 2016-12-01 ENCOUNTER — Other Ambulatory Visit: Payer: Self-pay | Admitting: Physician Assistant

## 2016-12-01 DIAGNOSIS — I10 Essential (primary) hypertension: Secondary | ICD-10-CM
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
## 2017-01-09 ENCOUNTER — Other Ambulatory Visit: Payer: Self-pay | Admitting: Physician Assistant

## 2017-01-09 DIAGNOSIS — E039 Hypothyroidism, unspecified: Secondary | ICD-10-CM

## 2017-01-09 DIAGNOSIS — E785 Hyperlipidemia, unspecified: Secondary | ICD-10-CM

## 2017-01-12 ENCOUNTER — Other Ambulatory Visit: Payer: Self-pay | Admitting: *Deleted

## 2017-01-12 DIAGNOSIS — I10 Essential (primary) hypertension: Secondary | ICD-10-CM

## 2017-01-12 MED ORDER — RAMIPRIL 10 MG PO CAPS: 10.0000 mg | ORAL_CAPSULE | Freq: Every day | ORAL | 0 refills | Status: DC

## 2017-01-12 MED ORDER — HYDROCHLOROTHIAZIDE 25 MG PO TABS: 25.0000 mg | ORAL_TABLET | Freq: Every day | ORAL | 0 refills | Status: DC

## 2017-01-12 MED ORDER — AMLODIPINE BESYLATE 10 MG PO TABS: ORAL_TABLET | ORAL | 0 refills | Status: DC

## 2017-01-16 ENCOUNTER — Other Ambulatory Visit: Payer: Self-pay | Admitting: Physician Assistant

## 2017-01-16 DIAGNOSIS — I10 Essential (primary) hypertension: Secondary | ICD-10-CM

## 2017-02-12 ENCOUNTER — Other Ambulatory Visit: Payer: Self-pay | Admitting: Physician Assistant

## 2017-02-12 DIAGNOSIS — E785 Hyperlipidemia, unspecified: Secondary | ICD-10-CM

## 2017-02-14 NOTE — Telephone Encounter (Signed)
atorvastatin (LIPITOR) - denied due to pt ned office visit   Please Call pt and schedule office visit

## 2017-02-18 ENCOUNTER — Other Ambulatory Visit: Payer: Self-pay | Admitting: Physician Assistant

## 2017-02-18 DIAGNOSIS — E119 Type 2 diabetes mellitus without complications: Secondary | ICD-10-CM

## 2017-02-19 NOTE — Telephone Encounter (Signed)
Refill req approved for metformin 500 mg #120 with no refills.  Pt needs to schedule appt for future refills.  Will send to schedulers to call pt for appt with [facility].

## 2017-03-09 ENCOUNTER — Encounter: Payer: Self-pay | Admitting: Physician Assistant

## 2017-03-09 ENCOUNTER — Ambulatory Visit (INDEPENDENT_AMBULATORY_CARE_PROVIDER_SITE_OTHER): Payer: 59 | Admitting: Physician Assistant

## 2017-03-09 VITALS — BP 140/90 | HR 59 | Temp 98.0°F | Resp 16 | Ht 73.0 in | Wt 272.8 lb

## 2017-03-09 DIAGNOSIS — E119 Type 2 diabetes mellitus without complications: Secondary | ICD-10-CM

## 2017-03-09 DIAGNOSIS — E785 Hyperlipidemia, unspecified: Secondary | ICD-10-CM | POA: Diagnosis not present

## 2017-03-09 DIAGNOSIS — Z8719 Personal history of other diseases of the digestive system: Secondary | ICD-10-CM

## 2017-03-09 DIAGNOSIS — Z79899 Other long term (current) drug therapy: Secondary | ICD-10-CM | POA: Diagnosis not present

## 2017-03-09 DIAGNOSIS — K219 Gastro-esophageal reflux disease without esophagitis: Secondary | ICD-10-CM | POA: Diagnosis not present

## 2017-03-09 DIAGNOSIS — Z720 Tobacco use: Secondary | ICD-10-CM | POA: Diagnosis not present

## 2017-03-09 DIAGNOSIS — I10 Essential (primary) hypertension: Secondary | ICD-10-CM

## 2017-03-09 DIAGNOSIS — E039 Hypothyroidism, unspecified: Secondary | ICD-10-CM | POA: Diagnosis not present

## 2017-03-09 MED ORDER — RAMIPRIL 10 MG PO CAPS: 10.0000 mg | ORAL_CAPSULE | Freq: Every day | ORAL | 1 refills | Status: DC

## 2017-03-09 MED ORDER — LEVOTHYROXINE SODIUM 100 MCG PO TABS: ORAL_TABLET | ORAL | 1 refills | Status: DC

## 2017-03-09 MED ORDER — ATORVASTATIN CALCIUM 40 MG PO TABS: ORAL_TABLET | ORAL | 1 refills | Status: DC

## 2017-03-09 MED ORDER — METFORMIN HCL ER 500 MG PO TB24
2000.0000 mg | ORAL_TABLET | Freq: Every day | ORAL | 1 refills | Status: DC
Start: 2017-03-09 — End: 2017-09-11

## 2017-03-09 MED ORDER — AMLODIPINE BESYLATE 10 MG PO TABS: ORAL_TABLET | ORAL | 1 refills | Status: DC

## 2017-03-09 MED ORDER — HYDROCHLOROTHIAZIDE 25 MG PO TABS: 25.0000 mg | ORAL_TABLET | Freq: Every day | ORAL | 1 refills | Status: DC

## 2017-03-09 MED ORDER — GLIPIZIDE 5 MG PO TABS: 5.0000 mg | ORAL_TABLET | Freq: Every day | ORAL | 1 refills | Status: DC

## 2017-03-09 MED ORDER — ESOMEPRAZOLE MAGNESIUM 40 MG PO CPDR: DELAYED_RELEASE_CAPSULE | ORAL | 3 refills | Status: DC

## 2017-03-09 NOTE — Progress Notes (Signed)
Colin Ramirez  MRN: [id number] DOB: June 04, 1966  Subjective:  Colin Ramirez is a 51 y.o. male seen in office today for a chief complaint of medication refill for htn, hld, T2DM,Hypothyroidism, and GERD.  He was last seen in our clinic on 08/11/16. He is fasting today.  HTN: Has had a diagnosis for over 20 years.  Currently managed with amlodipine 65m, hctz 296m and ramipril 108m Patient is checking blood pressure at home, range is 130833-825Kstolic. Notes he has noticed for the past few months that his pharmacy has not given him all three of his meds so he thinks that is why his bp is elevated today. Has been out of ramipril. Denies lightheadedness, dizziness, chronic headache, double vision, chest pain, shortness of breath, heart racing, palpitations, nausea, vomiting, abdominal pain, hematuria, lower leg swelling. Pt does smoke a ppd x 25 years.  Denies alcohol ues. Diet consists of grilled chicken and vegetables. He has been eating more starches recently. He drinks water and more sodas than he would like to. He is moving a lot throughout the day but no structured exercise. Has sleep apnea. He has not been wearing his CPAP. Notes he knows he needs to contact neurology to get new materials for his CPAP machine.  T2DM: Has had a diagnosis for 3 years. Controlled on glucophage XR 1000m8mD and glipizide 5mg 23mly,  which was started at his last OV in 07/2016. He is not checking his sugars daily but admits good compliance. He checks his feet daily. Last A1C in 05/2016 was 8.5. Has not had an eye exam in quite some time. No current symptoms.   Hiatal hernia history and GERD: Was evaluated 15 years ago by GI and was put on nexium 40mg.80mwas told that he would need to use PPIs longterm. If he goes a day without it he has terrible reflux.   Hypothyroidism: Has had a diagnosis for a number of years. Controlled with synthroid 100mcg.48mLD: Dx for the past 3 years. Controlled on lipitor  40mg.  6miew of Systems  Gastrointestinal: Negative for abdominal pain, constipation, diarrhea, nausea and vomiting.  Endocrine: Negative for polydipsia, polyphagia and polyuria.  Neurological: Negative for dizziness, light-headedness and headaches.    Patient Active Problem List   Diagnosis Date Noted  . Unilateral primary osteoarthritis, left hip 08/20/2016  . Status post left hip replacement 08/20/2016  . Chest pain 10/30/2013  . Unspecified hypothyroidism 08/19/2012  . Chronic pain 05/17/2012  . BMI 37.0-37.9,adult 05/17/2012  . Obesity, Class II, BMI 35-39.9, with comorbidity 12/02/2011  . Degenerative disc disease 12/02/2011  . Tobacco user 12/02/2011  . Diabetes mellitus (HCC) 05/[place]2013  . HTN (hypertension) 11/08/2011  . Hyperlipidemia 11/08/2011  . GERD (gastroesophageal reflux disease) 11/08/2011  . Sleep apnea 11/08/2011    Current Outpatient Prescriptions on File Prior to Visit  Medication Sig Dispense Refill  . amLODipine (NORVASC) 10 MG tablet TAKE 1 TABLET (10 MG TOTAL) BY MOUTH DAILY 30 tablet 0  . Aspirin-Salicylamide-Caffeine (BC FAST PAIN RELIEF) [phone] MG PACK Take 1 packet by mouth every 8 (eight) hours as needed (for pain.).    . atorvaMarland Kitchentatin (LIPITOR) 40 MG tablet TAKE 1 TABLET (20 MG TOTAL) BY MOUTH DAILY 30 tablet 0  . esomeprazole (NEXIUM) 40 MG capsule TAKE 1 CAPSULE (40 MG TOTAL) BY MOUTH DAILY. 90 capsule 3  . glipiZIDE (GLUCOTROL) 5 MG tablet Take 0.5 tablets (2.5 mg total) by mouth daily before breakfast. 90  tablet 1  . hydrochlorothiazide (HYDRODIURIL) 25 MG tablet Take 1 tablet (25 mg total) by mouth daily. 30 tablet 0  . levothyroxine (SYNTHROID, LEVOTHROID) 100 MCG tablet Take 1 tablet (100 mcg total) by mouth daily before breakfast. TAKE 1 TABLET (100 MCG TOTAL) BY MOUTH DAILY  -office visit needed 90 tablet 0  . metFORMIN (GLUCOPHAGE-XR) 500 MG 24 hr tablet TAKE 4 TABLETS (2,000 MG TOTAL) BY MOUTH DAILY WITH BREAKFAST. 120 tablet 0  .  oxymorphone (OPANA) 10 MG tablet Take 10 mg by mouth every 6 (six) hours as needed for pain.    . ramipril (ALTACE) 10 MG capsule Take 1 capsule (10 mg total) by mouth daily. 30 capsule 0  . cetirizine (ZYRTEC) 10 MG tablet Take 1 tablet (10 mg total) by mouth daily. (Patient not taking: Reported on 03/09/2017) 30 tablet 11  . methocarbamol (ROBAXIN) 500 MG tablet Take 1 tablet (500 mg total) by mouth every 6 (six) hours as needed for muscle spasms. (Patient not taking: Reported on 03/09/2017) 30 tablet 2   No current facility-administered medications on file prior to visit.     Allergies  Allergen Reactions  . Omnicef [Cefdinir] Rash      Social History   Social History  . Marital status: Married    Spouse name: [name]  . Number of children: N/A  . Years of education: N/A   Occupational History  . Not on file.   Social History Main Topics  . Smoking status: Current Every Day Smoker    Packs/day: 1.00    Years: 25.00    Types: Cigarettes  . Smokeless tobacco: Never Used  . Alcohol use No  . Drug use: No  . Sexual activity: No   Other Topics Concern  . Not on file   Social History Narrative  . No narrative on file    Objective:  BP 140/90 (BP Location: Right Arm, Patient Position: Sitting, Cuff Size: Large)   Pulse (!) 59   Temp 98 F (36.7 C) (Oral)   Resp 16   Ht 6' 1"  (1.854 m)   Wt 272 lb 12.8 oz (123.7 kg)   SpO2 95%   BMI 35.99 kg/m   Physical Exam  Constitutional: He is oriented to person, place, and time and well-developed, well-nourished, and in no distress.  HENT:  Head: Normocephalic and atraumatic.  Mouth/Throat: Uvula is midline, oropharynx is clear and moist and mucous membranes are normal.  Eyes: Pupils are equal, round, and reactive to light. Conjunctivae and EOM are normal.  Neck: Normal range of motion.  Cardiovascular: Normal rate, regular rhythm, normal heart sounds and intact distal pulses.   Pulmonary/Chest: Effort normal and breath  sounds normal. He has no wheezes. He has no rhonchi. He has no rales.  Abdominal: Soft. Normal appearance and bowel sounds are normal. There is no tenderness.  Musculoskeletal:       Right lower leg: He exhibits no swelling.       Left lower leg: He exhibits no swelling.  Neurological: He is alert and oriented to person, place, and time. Gait normal.  Skin: Skin is warm and dry.  Psychiatric: Affect normal.  Vitals reviewed.  Wt Readings from Last 3 Encounters:  03/09/17 272 lb 12.8 oz (123.7 kg)  08/20/16 271 lb (122.9 kg)  08/11/16 271 lb (122.9 kg)    Assessment and Plan :  1. Type 2 diabetes mellitus without complication, without long-term current use of insulin ([place]) Await lab results. Highly encouraged to  check sugars outside of office. Fasting blood sugar goal of 100. Consider increasing glipizide dose if A1c greater than 7. Plan for follow-up in 3 months for diabetes follow-up. - CBC with Differential/Platelet - CMP14+EGFR - Hemoglobin A1c - Microalbumin/Creatinine Ratio, Urine - Ambulatory referral to Ophthalmology - HM Diabetes Foot Exam - metFORMIN (GLUCOPHAGE-XR) 500 MG 24 hr tablet; Take 4 tablets (2,000 mg total) by mouth daily with breakfast.  Dispense: 360 tablet; Refill: 1 - glipiZIDE (GLUCOTROL) 5 MG tablet; Take 1 tablet (5 mg total) by mouth daily before breakfast.  Dispense: 90 tablet; Refill: 1  2. Essential hypertension Uncontrolled in office. Likely due to patient being out of one of his medications. Could also be due to uncontrolled sleep apnea. Have given him refills for all 3 of his medications. He is asymptomatic. Instructed to check bp outside of office over the next couple of weeks while on all of his medications. Return if consistently >140/90, as we may need to increase ramipril dose or add an additional BP medication. Given strict ED precautions.  - CBC with Differential/Platelet - CMP14+EGFR - ramipril (ALTACE) 10 MG capsule; Take 1 capsule (10 mg  total) by mouth daily.  Dispense: 90 capsule; Refill: 1 - amLODipine (NORVASC) 10 MG tablet; TAKE 1 TABLET (10 MG TOTAL) BY MOUTH DAILY  Dispense: 90 tablet; Refill: 1 - hydrochlorothiazide (HYDRODIURIL) 25 MG tablet; Take 1 tablet (25 mg total) by mouth daily.  Dispense: 90 tablet; Refill: 1  3. Hyperlipidemia, unspecified hyperlipidemia type Labs pending - Lipid panel - atorvastatin (LIPITOR) 40 MG tablet; TAKE 1 TABLET (20 MG TOTAL) BY MOUTH DAILY  Dispense: 90 tablet; Refill: 1  4. Hypothyroidism, unspecified type Labs pending - TSH - T3, Free - T4, Free - levothyroxine (SYNTHROID, LEVOTHROID) 100 MCG tablet; TAKE 1 TABLET (100 MCG TOTAL) BY MOUTH DAILY  Dispense: 90 tablet; Refill: 1  5. Gastroesophageal reflux disease, esophagitis presence not specified Educated on long-term risks associated with chronic use of PPIs. Encouraged to buy over-the-counter Nexium 20 mg capsulese if this can control GERD symptoms if not controlled on 20 mg, may increase back up to 40 mg daily. - esomeprazole (NEXIUM) 40 MG capsule; TAKE 1 CAPSULE (40 MG TOTAL) BY MOUTH DAILY.  Dispense: 90 capsule; Refill: 3  6. History of hiatal hernia - esomeprazole (NEXIUM) 40 MG capsule; TAKE 1 CAPSULE (40 MG TOTAL) BY MOUTH DAILY.  Dispense: 90 capsule; Refill: 3  7. Current use of proton pump inhibitor - Magnesium  8. Tobacco use Strongly encouraged to stop smoking. Patient is not ready to quit at this time.   Tenna Delaine PA-C  [facility] 03/09/2017 8:55 AM

## 2017-03-09 NOTE — Progress Notes (Signed)
mei

## 2017-03-09 NOTE — Patient Instructions (Addendum)
In terms of elevated blood pressure, I would like you to check your blood pressure at least a couple times over the next week outside of the office and document these values while on all of your medications. It is best if you check the blood pressure at different times in the day. Your goal is <140/90. If your values are consistently above this goal, please return to office for further evaluation. If you start to have chest pain, blurred vision, shortness of breath, severe headache, lower leg swelling, or nausea/vomiting please seek care immediately here or at the ED.   In terms of diabetes, continue medications daily. Please start checking your sugars outside of the office. Your fasting blood sugar goal is 100. Follow-up in 3 months for diabetes reevaluation.  For GERD, try taking over-the-counter Nexium 20 mg to control her symptoms. If no control with 20 mg may increase back up to 40 mg daily.   IF you received an x-ray today, you will receive an invoice from [facility]. Please contact [facility] at [phone] with questions or concerns regarding your invoice.   IF you received labwork today, you will receive an invoice from [facility]. Please contact [facility] at [phone] with questions or concerns regarding your invoice.   Our billing staff will not be able to assist you with questions regarding bills from these companies.  You will be contacted with the lab results as soon as they are available. The fastest way to get your results is to activate your My Chart account. Instructions are located on the last page of this paperwork. If you have not heard from [place] regarding the results in 2 weeks, please contact this office.

## 2017-03-10 LAB — CBC WITH DIFFERENTIAL/PLATELET
BASOS: 1 %
Basophils Absolute: 0.1 10*3/uL (ref 0.0–0.2)
EOS (ABSOLUTE): 0.2 10*3/uL (ref 0.0–0.4)
Eos: 3 %
Hematocrit: 46.9 % (ref 37.5–51.0)
Hemoglobin: 15.9 g/dL (ref 13.0–17.7)
IMMATURE GRANS (ABS): 0.1 10*3/uL (ref 0.0–0.1)
IMMATURE GRANULOCYTES: 1 %
LYMPHS: 23 %
Lymphocytes Absolute: 1.9 10*3/uL (ref 0.7–3.1)
MCH: 29.7 pg (ref 26.6–33.0)
MCHC: 33.9 g/dL (ref 31.5–35.7)
MCV: 88 fL (ref 79–97)
MONOS ABS: 0.5 10*3/uL (ref 0.1–0.9)
Monocytes: 6 %
NEUTROS PCT: 66 %
Neutrophils Absolute: 5.5 10*3/uL (ref 1.4–7.0)
Platelets: 244 10*3/uL (ref 150–379)
RBC: 5.35 x10E6/uL (ref 4.14–5.80)
RDW: 15.9 % — ABNORMAL HIGH (ref 12.3–15.4)
WBC: 8.3 10*3/uL (ref 3.4–10.8)

## 2017-03-10 LAB — CMP14+EGFR
ALT: 44 IU/L (ref 0–44)
AST: 36 IU/L (ref 0–40)
Albumin/Globulin Ratio: 1.9 (ref 1.2–2.2)
Albumin: 4.7 g/dL (ref 3.5–5.5)
Alkaline Phosphatase: 65 IU/L (ref 39–117)
BUN/Creatinine Ratio: 25 — ABNORMAL HIGH (ref 9–20)
BUN: 18 mg/dL (ref 6–24)
Bilirubin Total: 0.3 mg/dL (ref 0.0–1.2)
CALCIUM: 9.8 mg/dL (ref 8.7–10.2)
CO2: 25 mmol/L (ref 20–29)
CREATININE: 0.71 mg/dL — AB (ref 0.76–1.27)
Chloride: 100 mmol/L (ref 96–106)
GFR, EST AFRICAN AMERICAN: 124 mL/min/{1.73_m2} (ref >59)
GFR, EST NON AFRICAN AMERICAN: 107 mL/min/{1.73_m2} (ref >59)
GLUCOSE: 148 mg/dL — AB (ref 65–99)
Globulin, Total: 2.5 g/dL (ref 1.5–4.5)
POTASSIUM: 4.4 mmol/L (ref 3.5–5.2)
Sodium: 143 mmol/L (ref 134–144)
TOTAL PROTEIN: 7.2 g/dL (ref 6.0–8.5)

## 2017-03-10 LAB — LIPID PANEL
CHOL/HDL RATIO: 4.6 ratio (ref 0.0–5.0)
Cholesterol, Total: 175 mg/dL (ref 100–199)
HDL: 38 mg/dL — AB (ref >39)
LDL CALC: 95 mg/dL (ref 0–99)
TRIGLYCERIDES: 209 mg/dL — AB (ref 0–149)
VLDL CHOLESTEROL CAL: 42 mg/dL — AB (ref 5–40)

## 2017-03-10 LAB — HEMOGLOBIN A1C
ESTIMATED AVERAGE GLUCOSE: 177 mg/dL
HEMOGLOBIN A1C: 7.8 % — AB (ref 4.8–5.6)

## 2017-03-10 LAB — MICROALBUMIN / CREATININE URINE RATIO
Creatinine, Urine: 75.7 mg/dL
MICROALB/CREAT RATIO: 151.5 mg/g{creat} — AB (ref 0.0–30.0)
Microalbumin, Urine: 114.7 ug/mL

## 2017-03-10 LAB — T3, FREE: T3, Free: 2.8 pg/mL (ref 2.0–4.4)

## 2017-03-10 LAB — TSH: TSH: 6.31 u[IU]/mL — ABNORMAL HIGH (ref 0.450–4.500)

## 2017-03-10 LAB — T4, FREE: Free T4: 1.11 ng/dL (ref 0.82–1.77)

## 2017-03-10 LAB — MAGNESIUM: Magnesium: 2.3 mg/dL (ref 1.6–2.3)

## 2017-03-12 ENCOUNTER — Other Ambulatory Visit: Payer: Self-pay | Admitting: Physician Assistant

## 2017-03-12 DIAGNOSIS — E785 Hyperlipidemia, unspecified: Secondary | ICD-10-CM

## 2017-04-12 ENCOUNTER — Other Ambulatory Visit: Payer: Self-pay | Admitting: Physician Assistant

## 2017-04-12 ENCOUNTER — Ambulatory Visit (INDEPENDENT_AMBULATORY_CARE_PROVIDER_SITE_OTHER): Payer: Medicare Other

## 2017-04-12 ENCOUNTER — Ambulatory Visit (INDEPENDENT_AMBULATORY_CARE_PROVIDER_SITE_OTHER): Payer: Medicare Other | Admitting: Physician Assistant

## 2017-04-12 DIAGNOSIS — E785 Hyperlipidemia, unspecified: Secondary | ICD-10-CM

## 2017-04-12 DIAGNOSIS — Z96642 Presence of left artificial hip joint: Secondary | ICD-10-CM
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
## 2017-04-19 DIAGNOSIS — H9193 Unspecified hearing loss, bilateral: Secondary | ICD-10-CM | POA: Insufficient documentation

## 2017-05-14 ENCOUNTER — Encounter (HOSPITAL_COMMUNITY): Payer: Self-pay

## 2017-05-14 ENCOUNTER — Other Ambulatory Visit: Payer: Self-pay

## 2017-05-14 ENCOUNTER — Emergency Department (HOSPITAL_COMMUNITY)
Admission: EM | Admit: 2017-05-14 | Discharge: 2017-05-15 | Disposition: A | Discharge status: Home / Self Care | Payer: 59 | Attending: Emergency Medicine | Admitting: Emergency Medicine

## 2017-05-14 DIAGNOSIS — Z7984 Long term (current) use of oral hypoglycemic drugs: Secondary | ICD-10-CM | POA: Insufficient documentation

## 2017-05-14 DIAGNOSIS — R609 Edema, unspecified: Secondary | ICD-10-CM

## 2017-05-14 DIAGNOSIS — E039 Hypothyroidism, unspecified: Secondary | ICD-10-CM | POA: Insufficient documentation

## 2017-05-14 DIAGNOSIS — E119 Type 2 diabetes mellitus without complications: Secondary | ICD-10-CM | POA: Diagnosis not present

## 2017-05-14 DIAGNOSIS — R072 Precordial pain: Secondary | ICD-10-CM | POA: Insufficient documentation

## 2017-05-14 DIAGNOSIS — Z79899 Other long term (current) drug therapy: Secondary | ICD-10-CM | POA: Diagnosis not present

## 2017-05-14 DIAGNOSIS — R600 Localized edema: Secondary | ICD-10-CM | POA: Insufficient documentation

## 2017-05-14 DIAGNOSIS — I10 Essential (primary) hypertension: Secondary | ICD-10-CM | POA: Diagnosis not present

## 2017-05-14 DIAGNOSIS — R2243 Localized swelling, mass and lump, lower limb, bilateral: Secondary | ICD-10-CM | POA: Diagnosis present

## 2017-05-14 DIAGNOSIS — F17210 Nicotine dependence, cigarettes, uncomplicated: Secondary | ICD-10-CM | POA: Diagnosis not present

## 2017-05-14 LAB — CBC
HCT: 45.9 % (ref 39.0–52.0)
HEMOGLOBIN: 15.7 g/dL (ref 13.0–17.0)
MCH: 30.8 pg (ref 26.0–34.0)
MCHC: 34.2 g/dL (ref 30.0–36.0)
MCV: 90 fL (ref 78.0–100.0)
PLATELETS: 240 10*3/uL (ref 150–400)
RBC: 5.1 MIL/uL (ref 4.22–5.81)
RDW: 14.2 % (ref 11.5–15.5)
WBC: 10 10*3/uL (ref 4.0–10.5)

## 2017-05-14 LAB — I-STAT TROPONIN, ED: TROPONIN I, POC: 0.01 ng/mL (ref 0.00–0.08)

## 2017-05-14 NOTE — ED Triage Notes (Signed)
Pt states that he has a hx of DVT and feels like he has another on in both of his legs. Some redness and swelling to both sides, c/o of chest tightness

## 2017-05-15 ENCOUNTER — Emergency Department (HOSPITAL_COMMUNITY): Payer: 59

## 2017-05-15 ENCOUNTER — Ambulatory Visit (HOSPITAL_BASED_OUTPATIENT_CLINIC_OR_DEPARTMENT_OTHER)
Admission: RE | Admit: 2017-05-15 | Discharge: 2017-05-15 | Disposition: A | Discharge status: Home / Self Care | Payer: 59 | Source: Ambulatory Visit | Attending: Emergency Medicine | Admitting: Emergency Medicine

## 2017-05-15 DIAGNOSIS — R609 Edema, unspecified: Secondary | ICD-10-CM | POA: Diagnosis not present

## 2017-05-15 LAB — BASIC METABOLIC PANEL
Anion gap: 9 (ref 5–15)
BUN: 14 mg/dL (ref 6–20)
CALCIUM: 9 mg/dL (ref 8.9–10.3)
CHLORIDE: 102 mmol/L (ref 101–111)
CO2: 27 mmol/L (ref 22–32)
Creatinine, Ser: 0.77 mg/dL (ref 0.61–1.24)
GFR calc Af Amer: >60 mL/min (ref >60)
GFR calc non Af Amer: >60 mL/min (ref >60)
Glucose, Bld: 174 mg/dL — ABNORMAL HIGH (ref 65–99)
Potassium: 3.5 mmol/L (ref 3.5–5.1)
SODIUM: 138 mmol/L (ref 135–145)

## 2017-05-15 MED ORDER — RIVAROXABAN 15 MG PO TABS
15.0000 mg | ORAL_TABLET | Freq: Once | ORAL | Status: AC
Start: 2017-05-15 — End: 2017-05-15
  Administered 2017-05-15: 15 mg via ORAL
  Filled 2017-05-15: qty 1

## 2017-05-15 NOTE — Progress Notes (Signed)
VASCULAR LAB PRELIMINARY  PRELIMINARY  PRELIMINARY  PRELIMINARY  Bilateral lower extremity venous duplex completed.    Preliminary report:  There is no DVT or SVT noted in the bilateral lower extremities.   Alvenia Treese, RVT 05/15/2017, 8:53 AM
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
## 2017-05-15 NOTE — Discharge Instructions (Signed)

## 2017-06-24 ENCOUNTER — Encounter: Payer: Self-pay | Admitting: Physician Assistant

## 2017-06-24 ENCOUNTER — Other Ambulatory Visit: Payer: Self-pay

## 2017-06-24 ENCOUNTER — Ambulatory Visit (INDEPENDENT_AMBULATORY_CARE_PROVIDER_SITE_OTHER): Payer: Medicare Other | Admitting: Physician Assistant

## 2017-06-24 VITALS — BP 134/62 | HR 87 | Temp 98.8°F | Resp 17 | Ht 73.0 in | Wt 280.0 lb

## 2017-06-24 DIAGNOSIS — E039 Hypothyroidism, unspecified: Secondary | ICD-10-CM

## 2017-06-24 DIAGNOSIS — E118 Type 2 diabetes mellitus with unspecified complications: Secondary | ICD-10-CM

## 2017-06-24 DIAGNOSIS — Z1211 Encounter for screening for malignant neoplasm of colon: Secondary | ICD-10-CM

## 2017-06-24 DIAGNOSIS — Z720 Tobacco use: Secondary | ICD-10-CM

## 2017-06-24 LAB — POCT GLYCOSYLATED HEMOGLOBIN (HGB A1C): Hemoglobin A1C: 7.2

## 2017-06-24 MED ORDER — LEVOTHYROXINE SODIUM 100 MCG PO TABS: ORAL_TABLET | ORAL | 1 refills | Status: DC

## 2017-06-24 MED ORDER — SITAGLIPTIN PHOSPHATE 100 MG PO TABS: 100.0000 mg | ORAL_TABLET | Freq: Every day | ORAL | 0 refills | Status: DC

## 2017-06-24 NOTE — Progress Notes (Signed)
Colin Ramirez  MRN: [id number] DOB: March 27, 1966  Subjective:  Colin Ramirez is a 51 y.o. male seen in office today for a chief complaint of follow-up on hypothyroidism and diabetes.  Patient was last seen on 03/09/17.  During that time he had been off of his Synthroid and his TSH was elevated.  Recommend he return when he is on his Synthroid so we can check his TSH.  He takes 100 mcg daily.  Has been on this dose for quite some time and feels well controlled.  Denies any fatigue, constipation, chest pain, heart palpitations, and dry skin.  In terms of diabetes, patient has had diagnosis for about 3 years.  Currently controlled on Glucophage XR 1000 mg twice daily glipizide 5 mg daily.  We discussed increasing his glipizide to 10 mg daily at his last visit but patient did not understand this and has continued to take 5 mg daily.  Last A1c was 7.8.  He had an eye exam a couple months ago and it was normal.  He is not currently symptomatic.  Denies polyuria, polyphagia, polydipsia, dry mouth, abdominal pain, nausea, vomiting, blurred vision, and foot paresthesias.  He received his pneumonia vaccine on 04/01/2016.  In terms of diet, he admits that  bread is weakness.  He tries to avoid sweets.  He has not been exercising as much as he would like to but wants to increase this.  He continues to smoke 1 pack/day.  He is not ready to quit smoking at this time.   Review of Systems Per HPI  Patient Active Problem List   Diagnosis Date Noted  . Unilateral primary osteoarthritis, left hip 08/20/2016  . Status post left hip replacement 08/20/2016  . Chest pain 10/30/2013  . Unspecified hypothyroidism 08/19/2012  . Chronic pain 05/17/2012  . BMI 37.0-37.9,adult 05/17/2012  . Obesity, Class II, BMI 35-39.9, with comorbidity 12/02/2011  . Degenerative disc disease 12/02/2011  . Tobacco user 12/02/2011  . Diabetes mellitus ([place]) 11/08/2011  . HTN (hypertension) 11/08/2011  . Hyperlipidemia 11/08/2011   . GERD (gastroesophageal reflux disease) 11/08/2011  . Sleep apnea 11/08/2011    Current Outpatient Medications on File Prior to Visit  Medication Sig Dispense Refill  . amLODipine (NORVASC) 10 MG tablet TAKE 1 TABLET (10 MG TOTAL) BY MOUTH DAILY 90 tablet 1  . Aspirin-Salicylamide-Caffeine (BC FAST PAIN RELIEF) 650-195-33.3 MG PACK Take 1 packet by mouth every 8 (eight) hours as needed (for pain.).    [name] atorvastatin (LIPITOR) 40 MG tablet TAKE 1 TABLET (20 MG TOTAL) BY MOUTH DAILY 90 tablet 1  . esomeprazole (NEXIUM) 40 MG capsule TAKE 1 CAPSULE (40 MG TOTAL) BY MOUTH DAILY. 90 capsule 3  . glipiZIDE (GLUCOTROL) 5 MG tablet Take 1 tablet (5 mg total) by mouth daily before breakfast. 90 tablet 1  . hydrochlorothiazide (HYDRODIURIL) 25 MG tablet Take 1 tablet (25 mg total) by mouth daily. 90 tablet 1  . methocarbamol (ROBAXIN) 500 MG tablet Take 1 tablet (500 mg total) by mouth every 6 (six) hours as needed for muscle spasms. 30 tablet 2  . oxymorphone (OPANA) 10 MG tablet Take 10 mg by mouth every 6 (six) hours as needed for pain.    . ramipril (ALTACE) 10 MG capsule Take 1 capsule (10 mg total) by mouth daily. 90 capsule 1  . metFORMIN (GLUCOPHAGE-XR) 500 MG 24 hr tablet Take 4 tablets (2,000 mg total) by mouth daily with breakfast. 360 tablet 1   No current facility-administered medications  on file prior to visit.     Allergies  Allergen Reactions  . Omnicef [Cefdinir] Rash     Objective:  BP 134/62 (BP Location: Right Arm, Patient Position: Sitting, Cuff Size: Large)   Pulse 87   Temp 98.8 F (37.1 C) (Oral)   Resp 17   Ht 6\' 1"  (1.854 m)   Wt 280 lb (127 kg)   SpO2 96%   BMI 36.94 kg/m   Physical Exam  Constitutional: He is oriented to person, place, and time and well-developed, well-nourished, and in no distress.  HENT:  Head: Normocephalic and atraumatic.  Mouth/Throat: Uvula is midline, oropharynx is clear and moist and mucous membranes are normal.  Eyes:  Conjunctivae are normal. Pupils are equal, round, and reactive to light.  Neck: Normal range of motion.  Cardiovascular: Normal rate, regular rhythm and normal heart sounds.  Pulmonary/Chest: Effort normal and breath sounds normal. He has no wheezes. He has no rhonchi. He has no rales.  Abdominal: Soft. Normal appearance.  Neurological: He is alert and oriented to person, place, and time. Gait normal.  Skin: Skin is warm and dry.  Psychiatric: Affect normal.  Vitals reviewed.    Results for orders placed or performed in visit on 06/24/17 (from the past 24 hour(s))  POCT glycosylated hemoglobin (Hb A1C)     Status: None   Collection Time: 06/24/17  2:29 PM  Result Value Ref Range   Hemoglobin A1C 7.2    Wt Readings from Last 3 Encounters:  06/24/17 280 lb (127 kg)  03/09/17 272 lb 12.8 oz (123.7 kg)  08/20/16 271 lb (122.9 kg)    Assessment and Plan :  1. Hypothyroidism, unspecified type Labs pending.  May alter Synthroid dose depending on TSH results. - TSH - levothyroxine (SYNTHROID, LEVOTHROID) 100 MCG tablet; TAKE 1 TABLET (100 MCG TOTAL) BY MOUTH DAILY  Dispense: 90 tablet; Refill: 1  2. Type 2 diabetes mellitus with complication, unspecified whether long term insulin use (HCC) A1C has trended down from 7.8 three months ago to 7.2 today.  Encouraged patient to continue lifestyle modifications.  Ideally, would like patient to come off of glipizide, due to potential side effect of weight gain.  Patient has gained 8 pounds over the past 3 months.  Would like to start Januvia today, as long as patient's insurance covers it.  Patient encouraged to check fasting blood sugar at least 2-3 times a week.  Goal is 90-110.  Educated to discontinue glipizide if fasting blood sugar drops below 90.  Plan to follow-up in 3 months for reevaluation.  If A1C less than 7 at that time, plan to  decrease glipizide dose or discontinue altogether and continue with metformin and Januvia.   - POCT  glycosylated hemoglobin (Hb A1C) - sitaGLIPtin (JANUVIA) 100 MG tablet; Take 1 tablet (100 mg total) by mouth daily.  Dispense: 90 tablet; Refill: 0  3. Screen for colon cancer - Ambulatory referral to Gastroenterology  4. Tobacco user Strongly encouraged to discontinue smoking.  Patient is not ready to quit at this time.  Tenna Delaine PA-C  [facility] 06/24/2017 2:38 PM

## 2017-06-24 NOTE — Patient Instructions (Addendum)
You have enough medication for an additional 90-day supply on all your medications except for your Synthroid.  All you have to do is call the company and tell them that you need your prescriptions.  I have sent in a prescription for Synthroid.  Depending on your TSH results, we may change the dose of your Synthroid but I will contact you and let you know this.  In terms of diabetes, your A1c is down from 7.8 to 7.2, which is great.For diabetes management, I would like you to start using januvia 100mg  daily along with metformin and glipizde at your current dose. The goal is to lower the use of glipizide in 3 months as it can lead to weight gain.  I recommend you check your sugars at least 2-3 times a week when you are fasting.  Your goal is between 90 and 110.  Also focus on decreasing carbs in your diet.  I will give you some information below about diabetes and nutrition.  Plan to follow up in 3 months for reevaluation.  Diabetes Mellitus and Nutrition When you have diabetes (diabetes mellitus), it is very important to have healthy eating habits because your blood sugar (glucose) levels are greatly affected by what you eat and drink. Eating healthy foods in the appropriate amounts, at about the same times every day, can help you:  Control your blood glucose.  Lower your risk of heart disease.  Improve your blood pressure.  Reach or maintain a healthy weight.  Every person with diabetes is different, and each person has different needs for a meal plan. Your health care provider may recommend that you work with a diet and nutrition specialist (dietitian) to make a meal plan that is best for you. Your meal plan may vary depending on factors such as:  The calories you need.  The medicines you take.  Your weight.  Your blood glucose, blood pressure, and cholesterol levels.  Your activity level.  Other health conditions you have, such as heart or kidney disease.  How do carbohydrates affect  me? Carbohydrates affect your blood glucose level more than any other type of food. Eating carbohydrates naturally increases the amount of glucose in your blood. Carbohydrate counting is a method for keeping track of how many carbohydrates you eat. Counting carbohydrates is important to keep your blood glucose at a healthy level, especially if you use insulin or take certain oral diabetes medicines. It is important to know how many carbohydrates you can safely have in each meal. This is different for every person. Your dietitian can help you calculate how many carbohydrates you should have at each meal and for snack. Foods that contain carbohydrates include:  Bread, cereal, rice, pasta, and crackers.  Potatoes and corn.  Peas, beans, and lentils.  Milk and yogurt.  Fruit and juice.  Desserts, such as cakes, cookies, ice cream, and candy.  How does alcohol affect me? Alcohol can cause a sudden decrease in blood glucose (hypoglycemia), especially if you use insulin or take certain oral diabetes medicines. Hypoglycemia can be a life-threatening condition. Symptoms of hypoglycemia (sleepiness, dizziness, and confusion) are similar to symptoms of having too much alcohol. If your health care provider says that alcohol is safe for you, follow these guidelines:  Limit alcohol intake to no more than 1 drink per day for nonpregnant women and 2 drinks per day for men. One drink equals 12 oz of beer, 5 oz of wine, or 1 oz of hard liquor.  Do not  drink on an empty stomach.  Keep yourself hydrated with water, diet soda, or unsweetened iced tea.  Keep in mind that regular soda, juice, and other mixers may contain a lot of sugar and must be counted as carbohydrates.  What are tips for following this plan? Reading food labels  Start by checking the serving size on the label. The amount of calories, carbohydrates, fats, and other nutrients listed on the label are based on one serving of the food. Many  foods contain more than one serving per package.  Check the total grams (g) of carbohydrates in one serving. You can calculate the number of servings of carbohydrates in one serving by dividing the total carbohydrates by 15. For example, if a food has 30 g of total carbohydrates, it would be equal to 2 servings of carbohydrates.  Check the number of grams (g) of saturated and trans fats in one serving. Choose foods that have low or no amount of these fats.  Check the number of milligrams (mg) of sodium in one serving. Most people should limit total sodium intake to less than 2,300 mg per day.  Always check the nutrition information of foods labeled as "low-fat" or "nonfat". These foods may be higher in added sugar or refined carbohydrates and should be avoided.  Talk to your dietitian to identify your daily goals for nutrients listed on the label. Shopping  Avoid buying canned, premade, or processed foods. These foods tend to be high in fat, sodium, and added sugar.  Shop around the outside edge of the grocery store. This includes fresh fruits and vegetables, bulk grains, fresh meats, and fresh dairy. Cooking  Use low-heat cooking methods, such as baking, instead of high-heat cooking methods like deep frying.  Cook using healthy oils, such as olive, canola, or sunflower oil.  Avoid cooking with butter, cream, or high-fat meats. Meal planning  Eat meals and snacks regularly, preferably at the same times every day. Avoid going long periods of time without eating.  Eat foods high in fiber, such as fresh fruits, vegetables, beans, and whole grains. Talk to your dietitian about how many servings of carbohydrates you can eat at each meal.  Eat 4-6 ounces of lean protein each day, such as lean meat, chicken, fish, eggs, or tofu. 1 ounce is equal to 1 ounce of meat, chicken, or fish, 1 egg, or 1/4 cup of tofu.  Eat some foods each day that contain healthy fats, such as avocado, nuts, seeds,  and fish. Lifestyle   Check your blood glucose regularly.  Exercise at least 30 minutes 5 or more days each week, or as told by your health care provider.  Take medicines as told by your health care provider.  Do not use any products that contain nicotine or tobacco, such as cigarettes and e-cigarettes. If you need help quitting, ask your health care provider.  Work with a Social worker or diabetes educator to identify strategies to manage stress and any emotional and social challenges. What are some questions to ask my health care provider?  Do I need to meet with a diabetes educator?  Do I need to meet with a dietitian?  What number can I call if I have questions?  When are the best times to check my blood glucose? Where to find more information:  American Diabetes Association: diabetes.org/food-and-fitness/food  Academy of Nutrition and Dietetics: PokerClues.dk  [employer] of Diabetes and Digestive and Kidney Diseases (NIH): ContactWire.be Summary  A healthy meal plan will help  you control your blood glucose and maintain a healthy lifestyle.  Working with a diet and nutrition specialist (dietitian) can help you make a meal plan that is best for you.  Keep in mind that carbohydrates and alcohol have immediate effects on your blood glucose levels. It is important to count carbohydrates and to use alcohol carefully. This information is not intended to replace advice given to you by your health care provider. Make sure you discuss any questions you have with your health care provider. Document Released: 03/11/2005 Document Revised: 07/19/2016 Document Reviewed: 07/19/2016 Elsevier Interactive Patient Education  2018 [employer].   IF you received an x-ray today, you will receive an invoice from [facility]. Please contact [facility] at [phone] with questions or concerns regarding your invoice.   IF you received labwork today, you will receive an invoice from [facility]. Please contact [facility] at [phone] with questions or concerns regarding your invoice.   Our billing staff will not be able to assist you with questions regarding bills from these companies.  You will be contacted with the lab results as soon as they are available. The fastest way to get your results is to activate your My Chart account. Instructions are located on the last page of this paperwork. If you have not heard from [place] regarding the results in 2 weeks, please contact this office.

## 2017-06-25 LAB — TSH: TSH: 6.49 u[IU]/mL — AB (ref 0.450–4.500)

## 2017-06-27 ENCOUNTER — Encounter: Payer: Self-pay | Admitting: Physician Assistant

## 2017-06-30 ENCOUNTER — Other Ambulatory Visit: Payer: Self-pay | Admitting: Physician Assistant

## 2017-06-30 DIAGNOSIS — E039 Hypothyroidism, unspecified: Secondary | ICD-10-CM

## 2017-06-30 MED ORDER — LEVOTHYROXINE SODIUM 112 MCG PO TABS: 112.0000 ug | ORAL_TABLET | Freq: Every day | ORAL | 0 refills | Status: DC

## 2017-06-30 NOTE — Progress Notes (Signed)
Meds ordered this encounter  Medications  . levothyroxine (SYNTHROID, LEVOTHROID) 112 MCG tablet    Sig: Take 1 tablet (112 mcg total) by mouth daily.    Dispense:  90 tablet    Refill:  0    Order Specific Question:   Supervising Provider    Answer:   Wardell Honour [[id number]]

## 2017-08-15 ENCOUNTER — Ambulatory Visit (INDEPENDENT_AMBULATORY_CARE_PROVIDER_SITE_OTHER): Payer: 59 | Admitting: Orthopaedic Surgery

## 2017-08-15 ENCOUNTER — Encounter (INDEPENDENT_AMBULATORY_CARE_PROVIDER_SITE_OTHER): Payer: Self-pay | Admitting: Orthopaedic Surgery

## 2017-08-15 ENCOUNTER — Ambulatory Visit (INDEPENDENT_AMBULATORY_CARE_PROVIDER_SITE_OTHER): Payer: 59

## 2017-08-15 DIAGNOSIS — Z96642 Presence of left artificial hip joint: Secondary | ICD-10-CM

## 2017-08-15 NOTE — Progress Notes (Signed)
The patient is almost 1 year out from a left total hip arthroplasty.  He does have some pain down from the lateral aspect of his thigh and anterior but not proximal.  He says incisions He has no pain of the trochanteric area either.  He walks without a limp or assistive device.  He denies any groin pain.  He denies any right hip pain.  In the both hips the range of motion fully causes no pain in the groin or the proximal hip.  His only pain is in the thigh area but the distal to where the prosthesis is.  X-rays reviewed and showed no complicating features of the hip replacement in general grossly.  At this point will continue increase his activities as comfort allows.  I would like to see him back in 1 year with an AP and lateral of the left hip only.  We do not need to see the pelvis.  All questions concerns were answered and addressed.

## 2017-09-11 ENCOUNTER — Other Ambulatory Visit: Payer: Self-pay | Admitting: Physician Assistant

## 2017-09-11 DIAGNOSIS — E785 Hyperlipidemia, unspecified: Secondary | ICD-10-CM

## 2017-09-11 DIAGNOSIS — E118 Type 2 diabetes mellitus with unspecified complications: Secondary | ICD-10-CM

## 2017-09-11 DIAGNOSIS — I10 Essential (primary) hypertension: Secondary | ICD-10-CM

## 2017-09-11 DIAGNOSIS — E119 Type 2 diabetes mellitus without complications: Secondary | ICD-10-CM

## 2017-09-11 DIAGNOSIS — E039 Hypothyroidism, unspecified: Secondary | ICD-10-CM

## 2017-09-20 ENCOUNTER — Telehealth: Payer: Self-pay | Admitting: Physician Assistant

## 2017-09-20 DIAGNOSIS — E039 Hypothyroidism, unspecified: Secondary | ICD-10-CM

## 2017-09-20 DIAGNOSIS — I10 Essential (primary) hypertension: Secondary | ICD-10-CM

## 2017-09-20 MED ORDER — RAMIPRIL 10 MG PO CAPS: 10.0000 mg | ORAL_CAPSULE | Freq: Every day | ORAL | 1 refills | Status: DC

## 2017-09-20 MED ORDER — LEVOTHYROXINE SODIUM 100 MCG PO TABS: ORAL_TABLET | ORAL | 0 refills | Status: DC

## 2017-09-20 NOTE — Telephone Encounter (Signed)
Patient called and informed he will need to schedule a 3 month follow up per notes of [place] at the last [place] 05/2017, patient verbalized understanding, appointment made for Thursday, 09/22/17 at 0800. I advised his prescriptions will be filled.

## 2017-09-20 NOTE — Telephone Encounter (Signed)
Copied from [place] [id number]. Topic: Quick Communication - Rx Refill/Question >> Sep 20, 2017  1:48 PM Fraser Busche, Doylene Canard E, NT wrote: Medication: ramipril (ALTACE) 10 MG capsule and levothyroxine (SYNTHROID, LEVOTHROID) 100 MCG tablet Has the patient contacted their pharmacy? Yes  (Agent: If no, request that the patient contact the pharmacy for the refill.) Preferred Pharmacy (with phone number or street name): [facility] [place], [place] to [facility] [phone] (Phone) [phone] (Fax)     Agent: Please be advised that RX refills may take up to 3 business days. We ask that you follow-up with your pharmacy.

## 2017-09-22 ENCOUNTER — Ambulatory Visit (INDEPENDENT_AMBULATORY_CARE_PROVIDER_SITE_OTHER): Payer: Medicare Other | Admitting: Physician Assistant

## 2017-09-22 ENCOUNTER — Encounter: Payer: Self-pay | Admitting: Physician Assistant

## 2017-09-22 ENCOUNTER — Other Ambulatory Visit: Payer: Self-pay

## 2017-09-22 VITALS — BP 119/81 | HR 84 | Temp 97.7°F | Resp 18 | Ht 74.41 in | Wt 270.2 lb

## 2017-09-22 DIAGNOSIS — Z23 Encounter for immunization: Secondary | ICD-10-CM

## 2017-09-22 DIAGNOSIS — Z720 Tobacco use: Secondary | ICD-10-CM | POA: Diagnosis not present

## 2017-09-22 DIAGNOSIS — E119 Type 2 diabetes mellitus without complications: Secondary | ICD-10-CM

## 2017-09-22 DIAGNOSIS — E039 Hypothyroidism, unspecified: Secondary | ICD-10-CM | POA: Diagnosis not present

## 2017-09-22 DIAGNOSIS — Z1211 Encounter for screening for malignant neoplasm of colon: Secondary | ICD-10-CM

## 2017-09-22 LAB — POCT GLYCOSYLATED HEMOGLOBIN (HGB A1C): HEMOGLOBIN A1C: 7

## 2017-09-22 MED ORDER — METFORMIN HCL ER 500 MG PO TB24: 1000.0000 mg | ORAL_TABLET | Freq: Two times a day (BID) | ORAL | 0 refills | Status: DC

## 2017-09-22 MED ORDER — ZOSTER VAC RECOMB ADJUVANTED 50 MCG/0.5ML IM SUSR: 0.5000 mL | Freq: Once | INTRAMUSCULAR | 0 refills | Status: AC

## 2017-09-22 MED ORDER — LEVOTHYROXINE SODIUM 112 MCG PO TABS: 112.0000 ug | ORAL_TABLET | Freq: Every day | ORAL | 1 refills | Status: DC

## 2017-09-22 MED ORDER — ZOSTER VAC RECOMB ADJUVANTED 50 MCG/0.5ML IM SUSR: 0.5000 mL | Freq: Once | INTRAMUSCULAR | 0 refills | Status: DC

## 2017-09-22 NOTE — Patient Instructions (Addendum)
For diabetes and hypertension, continue with your current medication regimen.  For hypothyroidism, continue Synthroid 112 mcg.  It looks like someone refilled your medication for 100 MCG but I gave you a new refill today for 112 MCG.  Depending on your TSH value, we may alter this dose.    In terms of medications, it looks like you have refills.  If you run out of any refills before your follow-up visit in 3 months, please contact our office.   I have printed out the prescription to get the shingles vaccine.  You have to take this to a local pharmacy and have this done.  I am very proud that you have lost 10 pounds of the last 3 months.  Keep up the great work!  Really try to focus on decreasing smoking.  Below is some information about smoking cessation.  Even cutting down by a quarter to half a pack would be great for you!      What are the benefits of quitting smoking? - Quitting smoking can lower your chances of getting or dying from heart disease, lung disease, kidney failure, infection, or cancer. It can also lower your chances of getting osteoporosis, a condition that makes your bones weak. Plus, quitting smoking can help your skin look younger and reduce the chances that you will have problems with sex.  Quitting smoking will improve your health no matter how old you are, and no matter how long or how much you have smoked.  What should I do if I want to quit smoking? - The letters in the word "START" can help you remember the steps to take:  S = Set a quit date.  T = Tell family, friends, and the people around you that you plan to quit.  A = Anticipate or plan ahead for the tough times you'll face while quitting.  R = Remove cigarettes and other tobacco products from your home, car, and work.  T = Talk to your doctor about getting help to quit.  How can my doctor or nurse help? - Your doctor or nurse can give you advice on the best way to quit. He or she can also put you in  touch with counselors or other people you can call for support. Plus, your doctor or nurse can give you medicines to:  - Reduce your craving for cigarettes - Reduce the unpleasant symptoms that happen when you stop smoking (called "withdrawal symptoms").  You can also get help from a free phone line (1-800-QUIT-NOW) or go online to [facility].  What are the symptoms of withdrawal? - The symptoms include:  - Trouble sleeping - Being irritable, anxious or restless - Getting frustrated or angry - Having trouble thinking clearly Some people who stop smoking become temporarily depressed. Some of them need treatment for depression, such as counseling or antidepressant medicines. If you get depressed when you quit smoking, tell your doctor or nurse about it.  How do medicines help? - Different medicines work in different ways:  - Nicotine replacement therapy eases withdrawal and reduces your body's craving for nicotine, the main drug found in cigarettes. Non-prescription forms of nicotine replacement include skin patches, lozenges, and gum. Prescription forms include nasal sprays and "puffers" or inhalers. - Bupropion is a prescription medicine that reduces your desire to smoke. This medicine is sold under the brand names Zyban and Wellbutrin. It is also available in a generic version, which is cheaper than brand-name medicines.  - Varenicline (brand name: Chantix)  is a prescription medicine that reduces withdrawal symptoms and cigarette cravings. If you think you'd like to take varenicline and you have a history of depression, anxiety, or heart disease, discuss this with your doctor or nurse before taking the medicine. Varenicline can also increase the effects of alcohol in some people. It's a good idea to limit drinking while you're taking it, at least until you know how it affects you. If you take bupropion or varenicline and you have any of the following symptoms, stop taking the medicine  and call your doctor or nurse:  ? Become very nervous ? Become depressed ? Start to do strange things ? Think about killing yourself  How does counseling work? - Counseling can happen during formal office visits or just over the phone. A counselor can help you:  ? Figure out what triggers your smoking and what to do instead ? Overcome cravings ? Figure out what went wrong when you tried to quit before  What works best? - Studies show that people have the best luck at quitting if they take medicines to help them quit and work with a Social worker. It might also be helpful to combine nicotine replacement with one of the prescription medicines that help people quit. In some cases, it might even make sense to take bupropion and varenicline together.    Will I gain weight if I quit? - Yes, you might gain a few pounds. But quitting smoking will have a much more positive effect on your health than weighing a few pounds more. Plus, you can help prevent some weight gain by being more active and eating less. Taking the medicine bupropion might help control weight gain.  What else can I do to improve my chances of quitting? - You can:  ? Start exercising. ? Stay away from smokers and places that you associate with smoking. If people close to you smoke, ask them to quit with you. ? Keep gum, hard candy, or something to put in your mouth handy. If you get a craving for a cigarette, try one of these instead. ? Don't give up, even if you start smoking again. It takes most people a few tries before they succeed.   IF you received an x-ray today, you will receive an invoice from [facility]. Please contact [facility] at [phone] with questions or concerns regarding your invoice.   IF you received labwork today, you will receive an invoice from [place]. Please contact [facility] at [phone] with questions or concerns regarding your invoice.   Our billing staff will not be able to  assist you with questions regarding bills from these companies.  You will be contacted with the lab results as soon as they are available. The fastest way to get your results is to activate your My Chart account. Instructions are located on the last page of this paperwork. If you have not heard from [place] regarding the results in 2 weeks, please contact this office.

## 2017-09-22 NOTE — Progress Notes (Signed)
MRN: [id number]  Subjective:   Colin Ramirez is a 52 y.o. male who presents for follow up of Type 2 diabetes mellitus. Dx was made ~3 years ago. Last f/u 3 months ago, A1C was 7.2  Started on januvia last visit, along with continued metformin xr 1082m BI and glipizide 548mdaily. Talked about decreasing glipizide at this visit if A1C <7 at follow up.Admits excellent compliance. Denies adverse effects including metallic taste, hypoglycemia, nausea, vomiting. Patient is not checking home blood sugars. Home blood sugar records: patient does not check sugars. Current symptoms include none. Patient denies foot ulcerations, increased appetite, nausea, paresthesia of the feet, polydipsia, polyuria, visual disturbances, and vomiting. Patient is checking their feet daily. No foot concerns. Last diabetic eye exam eye exam: within past 6 months. Eats a variety of food. In terms of exercise, no structured exercise but he is always up moving around.  Smoking ~2 ppd.  Known diabetic complications: none Immunizations: Flu vaccine: 2017, shingles: never, pneumococal vaccine: 03/2016  Other concerns: Hypothyroidism: Last TSH 3 months ago was elevated. Synthroid dose was increased from 10038mto 112m81maily. Encouraged to follow up in 6 weeks for recheck TSH but pt did not.   ROS Per HPI  Objective:   PHYSICAL EXAM BP 119/81 (BP Location: Right Arm, Patient Position: Sitting, Cuff Size: Large)   Pulse 84   Temp 97.7 F (36.5 C) (Oral)   Resp 18   Ht 6' 2.41" (1.89 m)   Wt 270 lb 3.2 oz (122.6 kg)   SpO2 99%   BMI 34.31 kg/m   Physical Exam  Constitutional: He is oriented to person, place, and time. He appears well-developed and well-nourished.  HENT:  Head: Normocephalic and atraumatic.  Mouth/Throat: Uvula is midline, oropharynx is clear and moist and mucous membranes are normal.  Eyes: Pupils are equal, round, and reactive to light. Conjunctivae are normal.  Neck: Normal range of motion.   Cardiovascular: Normal rate, regular rhythm and normal heart sounds.  Pulmonary/Chest: Effort normal and breath sounds normal. He has no wheezes. He has no rales.  Neurological: He is alert and oriented to person, place, and time.  Skin: Skin is warm and dry.  Psychiatric: He has a normal mood and affect.  Vitals reviewed.    Results for orders placed or performed in visit on 09/22/17 (from the past 24 hour(s))  POCT glycosylated hemoglobin (Hb A1C)     Status: None   Collection Time: 09/22/17  8:41 AM  Result Value Ref Range   Hemoglobin A1C 7.0      Wt Readings from Last 3 Encounters:  09/22/17 270 lb 3.2 oz (122.6 kg)  06/24/17 280 lb (127 kg)  03/09/17 272 lb 12.8 oz (123.7 kg)    Assessment and Plan :  1. Type 2 diabetes mellitus without complication, without long-term current use of insulin (HCC) A1C has trended down from 7.2 to 7.0. Pt has lost 10 lbs. Congratulated on effort. Recommend he continue with lifestyle modifications and current medication regimen. Plan to follow up in 3 months. If A1C less than 7, will decrease glipizide dose at that time.  - POCT glycosylated hemoglobin (Hb A1C) - CMP14+EGFR  2. Hypothyroidism, unspecified type Labs pending. Continue with synthroid 112mc30m this time. May alter dose depending on TSH.  - TSH  3. Screen for colon cancer - Ambulatory referral to Gastroenterology  4. Tobacco user Educated on smoking cessation. Pt given material to look over as well. He is not  ready to quit at this time.   5. Need for shingles vaccine - Zoster Vaccine Adjuvanted [facility]) injection; Inject 0.5 mLs into the muscle once for 1 dose.  Dispense: 0.5 mL; Refill: 0    Tenna Delaine, PA-C  [facility] [place] 09/22/2017 8:42 AM

## 2017-09-23 LAB — CMP14+EGFR
ALT: 44 IU/L (ref 0–44)
AST: 26 IU/L (ref 0–40)
Albumin/Globulin Ratio: 1.6 (ref 1.2–2.2)
Albumin: 4.2 g/dL (ref 3.5–5.5)
Alkaline Phosphatase: 55 IU/L (ref 39–117)
BUN/Creatinine Ratio: 20 (ref 9–20)
BUN: 17 mg/dL (ref 6–24)
Bilirubin Total: 0.3 mg/dL (ref 0.0–1.2)
CALCIUM: 9.2 mg/dL (ref 8.7–10.2)
CO2: 25 mmol/L (ref 20–29)
CREATININE: 0.84 mg/dL (ref 0.76–1.27)
Chloride: 102 mmol/L (ref 96–106)
GFR, EST AFRICAN AMERICAN: 115 mL/min/{1.73_m2} (ref >59)
GFR, EST NON AFRICAN AMERICAN: 99 mL/min/{1.73_m2} (ref >59)
GLUCOSE: 110 mg/dL — AB (ref 65–99)
Globulin, Total: 2.6 g/dL (ref 1.5–4.5)
Potassium: 3.6 mmol/L (ref 3.5–5.2)
Sodium: 144 mmol/L (ref 134–144)
TOTAL PROTEIN: 6.8 g/dL (ref 6.0–8.5)

## 2017-09-23 LAB — TSH: TSH: 6.84 u[IU]/mL — ABNORMAL HIGH (ref 0.450–4.500)

## 2017-09-28 ENCOUNTER — Telehealth: Payer: Self-pay | Admitting: Physician Assistant

## 2017-09-28 ENCOUNTER — Encounter: Payer: Self-pay | Admitting: *Deleted

## 2017-09-28 MED ORDER — LEVOTHYROXINE SODIUM 125 MCG PO TABS: 125.0000 ug | ORAL_TABLET | Freq: Every day | ORAL | 0 refills | Status: DC

## 2017-09-28 NOTE — Telephone Encounter (Signed)
Letter sent.

## 2017-09-28 NOTE — Telephone Encounter (Signed)
Attempted to contact pt but no answer. It was not his name on the voicemail so I did not leave a message. Can we please send him a letter with most recent lab results stating:  Hello Colin Ramirez,  Your results show that you have normal electrolytes, blood sugar, liver enzymes and kidney function.  Your TSH was still elevated.  We are going to increase your Synthroid dose from 112 mcg to 125 mcg.  I have sent the prescription for the new Synthroid dose to your [facility] pharmacy.  Please follow-up in 6 weeks for repeat TSH.  Contact our office if you have any questions or concerns.  I hope you are doing well.  Thanks!

## 2017-10-03 ENCOUNTER — Encounter: Payer: Self-pay | Admitting: Physician Assistant

## 2017-10-03 ENCOUNTER — Encounter: Payer: Self-pay | Admitting: *Deleted

## 2017-10-20 DIAGNOSIS — Z0271 Encounter for disability determination: Secondary | ICD-10-CM

## 2017-10-28 ENCOUNTER — Ambulatory Visit (HOSPITAL_COMMUNITY)
Admission: EM | Disposition: A | Discharge status: Home / Self Care | Payer: Self-pay | Source: Home / Self Care | Attending: Physician Assistant

## 2017-10-28 ENCOUNTER — Emergency Department (HOSPITAL_COMMUNITY): Payer: 59

## 2017-10-28 ENCOUNTER — Other Ambulatory Visit: Payer: Self-pay

## 2017-10-28 ENCOUNTER — Observation Stay (HOSPITAL_COMMUNITY)
Admission: EM | Admit: 2017-10-28 | Discharge: 2017-10-29 | Disposition: A | Discharge status: Home / Self Care | Payer: 59 | Attending: Cardiology | Admitting: Cardiology

## 2017-10-28 DIAGNOSIS — E782 Mixed hyperlipidemia: Secondary | ICD-10-CM

## 2017-10-28 DIAGNOSIS — F17210 Nicotine dependence, cigarettes, uncomplicated: Secondary | ICD-10-CM | POA: Diagnosis not present

## 2017-10-28 DIAGNOSIS — Z9889 Other specified postprocedural states: Secondary | ICD-10-CM | POA: Insufficient documentation

## 2017-10-28 DIAGNOSIS — Z79899 Other long term (current) drug therapy: Secondary | ICD-10-CM | POA: Diagnosis not present

## 2017-10-28 DIAGNOSIS — I471 Supraventricular tachycardia: Secondary | ICD-10-CM | POA: Insufficient documentation

## 2017-10-28 DIAGNOSIS — R079 Chest pain, unspecified: Secondary | ICD-10-CM

## 2017-10-28 DIAGNOSIS — Z79890 Hormone replacement therapy (postmenopausal): Secondary | ICD-10-CM | POA: Insufficient documentation

## 2017-10-28 DIAGNOSIS — Z833 Family history of diabetes mellitus: Secondary | ICD-10-CM | POA: Diagnosis not present

## 2017-10-28 DIAGNOSIS — Z7902 Long term (current) use of antithrombotics/antiplatelets: Secondary | ICD-10-CM | POA: Insufficient documentation

## 2017-10-28 DIAGNOSIS — E785 Hyperlipidemia, unspecified: Secondary | ICD-10-CM | POA: Diagnosis not present

## 2017-10-28 DIAGNOSIS — I214 Non-ST elevation (NSTEMI) myocardial infarction: Secondary | ICD-10-CM | POA: Diagnosis not present

## 2017-10-28 DIAGNOSIS — Z96642 Presence of left artificial hip joint: Secondary | ICD-10-CM | POA: Diagnosis not present

## 2017-10-28 DIAGNOSIS — Z7982 Long term (current) use of aspirin: Secondary | ICD-10-CM | POA: Insufficient documentation

## 2017-10-28 DIAGNOSIS — M1990 Unspecified osteoarthritis, unspecified site: Secondary | ICD-10-CM | POA: Insufficient documentation

## 2017-10-28 DIAGNOSIS — I25110 Atherosclerotic heart disease of native coronary artery with unstable angina pectoris: Principal | ICD-10-CM | POA: Insufficient documentation

## 2017-10-28 DIAGNOSIS — Z8249 Family history of ischemic heart disease and other diseases of the circulatory system: Secondary | ICD-10-CM | POA: Diagnosis not present

## 2017-10-28 DIAGNOSIS — G510 Bell's palsy: Secondary | ICD-10-CM | POA: Diagnosis not present

## 2017-10-28 DIAGNOSIS — Z6834 Body mass index (BMI) 34.0-34.9, adult: Secondary | ICD-10-CM | POA: Diagnosis not present

## 2017-10-28 DIAGNOSIS — Z7984 Long term (current) use of oral hypoglycemic drugs: Secondary | ICD-10-CM | POA: Diagnosis not present

## 2017-10-28 DIAGNOSIS — E119 Type 2 diabetes mellitus without complications: Secondary | ICD-10-CM

## 2017-10-28 DIAGNOSIS — I25119 Atherosclerotic heart disease of native coronary artery with unspecified angina pectoris: Secondary | ICD-10-CM | POA: Diagnosis present

## 2017-10-28 DIAGNOSIS — Z881 Allergy status to other antibiotic agents status: Secondary | ICD-10-CM | POA: Insufficient documentation

## 2017-10-28 DIAGNOSIS — Z87828 Personal history of other (healed) physical injury and trauma: Secondary | ICD-10-CM | POA: Insufficient documentation

## 2017-10-28 DIAGNOSIS — I200 Unstable angina: Secondary | ICD-10-CM | POA: Diagnosis not present

## 2017-10-28 DIAGNOSIS — E039 Hypothyroidism, unspecified: Secondary | ICD-10-CM | POA: Diagnosis not present

## 2017-10-28 DIAGNOSIS — I2510 Atherosclerotic heart disease of native coronary artery without angina pectoris: Secondary | ICD-10-CM | POA: Diagnosis present

## 2017-10-28 DIAGNOSIS — K219 Gastro-esophageal reflux disease without esophagitis: Secondary | ICD-10-CM | POA: Diagnosis not present

## 2017-10-28 DIAGNOSIS — G8929 Other chronic pain: Secondary | ICD-10-CM | POA: Diagnosis not present

## 2017-10-28 DIAGNOSIS — M1612 Unilateral primary osteoarthritis, left hip: Secondary | ICD-10-CM | POA: Insufficient documentation

## 2017-10-28 DIAGNOSIS — I10 Essential (primary) hypertension: Secondary | ICD-10-CM | POA: Diagnosis not present

## 2017-10-28 DIAGNOSIS — R072 Precordial pain: Secondary | ICD-10-CM | POA: Diagnosis present

## 2017-10-28 HISTORY — PX: LEFT HEART CATH AND CORONARY ANGIOGRAPHY: CATH118249

## 2017-10-28 LAB — I-STAT TROPONIN, ED: Troponin i, poc: 0.23 ng/mL (ref 0.00–0.08)

## 2017-10-28 LAB — BASIC METABOLIC PANEL
Anion gap: 11 (ref 5–15)
BUN: 11 mg/dL (ref 6–20)
CHLORIDE: 102 mmol/L (ref 101–111)
CO2: 28 mmol/L (ref 22–32)
Calcium: 9.3 mg/dL (ref 8.9–10.3)
Creatinine, Ser: 0.78 mg/dL (ref 0.61–1.24)
GFR calc Af Amer: >60 mL/min (ref >60)
GFR calc non Af Amer: >60 mL/min (ref >60)
GLUCOSE: 105 mg/dL — AB (ref 65–99)
Potassium: 3.8 mmol/L (ref 3.5–5.1)
Sodium: 141 mmol/L (ref 135–145)

## 2017-10-28 LAB — CBC
HCT: 47.7 % (ref 39.0–52.0)
Hemoglobin: 15.9 g/dL (ref 13.0–17.0)
MCH: 30 pg (ref 26.0–34.0)
MCHC: 33.3 g/dL (ref 30.0–36.0)
MCV: 90 fL (ref 78.0–100.0)
PLATELETS: 257 10*3/uL (ref 150–400)
RBC: 5.3 MIL/uL (ref 4.22–5.81)
RDW: 15.1 % (ref 11.5–15.5)
WBC: 11 10*3/uL — ABNORMAL HIGH (ref 4.0–10.5)

## 2017-10-28 LAB — GLUCOSE, CAPILLARY
Glucose-Capillary: 105 mg/dL — ABNORMAL HIGH (ref 65–99)
Glucose-Capillary: 174 mg/dL — ABNORMAL HIGH (ref 65–99)

## 2017-10-28 LAB — HEMOGLOBIN A1C
HEMOGLOBIN A1C: 6.8 % — AB (ref 4.8–5.6)
MEAN PLASMA GLUCOSE: 148.46 mg/dL

## 2017-10-28 LAB — TROPONIN I: Troponin I: <0.03 ng/mL (ref <0.03)

## 2017-10-28 SURGERY — LEFT HEART CATH AND CORONARY ANGIOGRAPHY
Anesthesia: LOCAL

## 2017-10-28 MED ORDER — LIDOCAINE HCL (PF) 1 % IJ SOLN
INTRAMUSCULAR | Status: AC
  Filled 2017-10-28: qty 30

## 2017-10-28 MED ORDER — FENTANYL CITRATE (PF) 100 MCG/2ML IJ SOLN
INTRAMUSCULAR | Status: DC | PRN
  Administered 2017-10-28: 25 ug via INTRAVENOUS

## 2017-10-28 MED ORDER — RAMIPRIL 10 MG PO CAPS
10.0000 mg | ORAL_CAPSULE | Freq: Every day | ORAL | Status: DC
  Administered 2017-10-29: 10 mg via ORAL
  Filled 2017-10-28: qty 1
  Filled 2017-10-28: qty 4

## 2017-10-28 MED ORDER — ATORVASTATIN CALCIUM 40 MG PO TABS
40.0000 mg | ORAL_TABLET | Freq: Every day | ORAL | Status: DC
  Administered 2017-10-28: 40 mg via ORAL
  Filled 2017-10-28: qty 1

## 2017-10-28 MED ORDER — OXYMORPHONE HCL ER 10 MG PO T12A: 10.0000 mg | EXTENDED_RELEASE_TABLET | Freq: Four times a day (QID) | ORAL | Status: DC | PRN

## 2017-10-28 MED ORDER — ACETAMINOPHEN 325 MG PO TABS: 650.0000 mg | ORAL_TABLET | ORAL | Status: DC | PRN

## 2017-10-28 MED ORDER — AMLODIPINE BESYLATE 10 MG PO TABS
10.0000 mg | ORAL_TABLET | Freq: Every day | ORAL | Status: DC
  Administered 2017-10-29: 10 mg via ORAL
  Filled 2017-10-28: qty 1

## 2017-10-28 MED ORDER — LEVOTHYROXINE SODIUM 125 MCG PO TABS
125.0000 ug | ORAL_TABLET | Freq: Every day | ORAL | Status: DC
  Administered 2017-10-29: 125 ug via ORAL
  Filled 2017-10-28: qty 1

## 2017-10-28 MED ORDER — MIDAZOLAM HCL 2 MG/2ML IJ SOLN
INTRAMUSCULAR | Status: AC
  Filled 2017-10-28: qty 2

## 2017-10-28 MED ORDER — VERAPAMIL HCL 2.5 MG/ML IV SOLN
INTRAVENOUS | Status: DC | PRN
  Administered 2017-10-28: 10 mL via INTRA_ARTERIAL

## 2017-10-28 MED ORDER — HEPARIN SODIUM (PORCINE) 1000 UNIT/ML IJ SOLN
INTRAMUSCULAR | Status: DC | PRN
  Administered 2017-10-28: 6000 [IU] via INTRAVENOUS

## 2017-10-28 MED ORDER — HYDROCHLOROTHIAZIDE 25 MG PO TABS
25.0000 mg | ORAL_TABLET | Freq: Every day | ORAL | Status: DC
  Administered 2017-10-29: 25 mg via ORAL
  Filled 2017-10-28: qty 1

## 2017-10-28 MED ORDER — HEPARIN (PORCINE) IN NACL 100-0.45 UNIT/ML-% IJ SOLN
1400.0000 [IU]/h | INTRAMUSCULAR | Status: DC
Start: 2017-10-28 — End: 2017-10-28
  Administered 2017-10-28: 1400 [IU]/h via INTRAVENOUS
  Filled 2017-10-28: qty 250

## 2017-10-28 MED ORDER — HEPARIN (PORCINE) IN NACL 1000-0.9 UT/500ML-% IV SOLN
INTRAVENOUS | Status: AC
  Filled 2017-10-28: qty 1000

## 2017-10-28 MED ORDER — IOHEXOL 350 MG/ML SOLN
INTRAVENOUS | Status: DC | PRN
  Administered 2017-10-28: 85 mL via INTRA_ARTERIAL

## 2017-10-28 MED ORDER — METHOCARBAMOL 500 MG PO TABS: 500.0000 mg | ORAL_TABLET | Freq: Four times a day (QID) | ORAL | Status: DC | PRN

## 2017-10-28 MED ORDER — LIDOCAINE HCL (PF) 1 % IJ SOLN
INTRAMUSCULAR | Status: DC | PRN
  Administered 2017-10-28: 2 mL via INTRADERMAL

## 2017-10-28 MED ORDER — SODIUM CHLORIDE 0.9% FLUSH: 3.0000 mL | Freq: Two times a day (BID) | INTRAVENOUS | Status: DC

## 2017-10-28 MED ORDER — CARVEDILOL 3.125 MG PO TABS
3.1250 mg | ORAL_TABLET | Freq: Two times a day (BID) | ORAL | Status: DC
  Administered 2017-10-29: 3.125 mg via ORAL
  Filled 2017-10-28 (×2): qty 1

## 2017-10-28 MED ORDER — SODIUM CHLORIDE 0.9% FLUSH: 3.0000 mL | INTRAVENOUS | Status: DC | PRN

## 2017-10-28 MED ORDER — MIDAZOLAM HCL 2 MG/2ML IJ SOLN
INTRAMUSCULAR | Status: DC | PRN
  Administered 2017-10-28: 2 mg via INTRAVENOUS

## 2017-10-28 MED ORDER — SODIUM CHLORIDE 0.9 % WEIGHT BASED INFUSION: 1.0000 mL/kg/h | INTRAVENOUS | Status: AC

## 2017-10-28 MED ORDER — ONDANSETRON HCL 4 MG/2ML IJ SOLN: 4.0000 mg | Freq: Four times a day (QID) | INTRAMUSCULAR | Status: DC | PRN

## 2017-10-28 MED ORDER — GLIPIZIDE 5 MG PO TABS
5.0000 mg | ORAL_TABLET | Freq: Every day | ORAL | Status: DC
  Administered 2017-10-29: 5 mg via ORAL
  Filled 2017-10-28: qty 1

## 2017-10-28 MED ORDER — INSULIN ASPART 100 UNIT/ML ~~LOC~~ SOLN: 0.0000 [IU] | Freq: Three times a day (TID) | SUBCUTANEOUS | Status: DC

## 2017-10-28 MED ORDER — INSULIN ASPART 100 UNIT/ML ~~LOC~~ SOLN: 0.0000 [IU] | Freq: Every day | SUBCUTANEOUS | Status: DC

## 2017-10-28 MED ORDER — HEPARIN SODIUM (PORCINE) 1000 UNIT/ML IJ SOLN
INTRAMUSCULAR | Status: AC
  Filled 2017-10-28: qty 1

## 2017-10-28 MED ORDER — FENTANYL CITRATE (PF) 100 MCG/2ML IJ SOLN
INTRAMUSCULAR | Status: AC
  Filled 2017-10-28: qty 2

## 2017-10-28 MED ORDER — HEPARIN BOLUS VIA INFUSION
4000.0000 [IU] | Freq: Once | INTRAVENOUS | Status: AC
  Administered 2017-10-28: 4000 [IU] via INTRAVENOUS
  Filled 2017-10-28: qty 4000

## 2017-10-28 MED ORDER — ZOLPIDEM TARTRATE 5 MG PO TABS: 5.0000 mg | ORAL_TABLET | Freq: Every evening | ORAL | Status: DC | PRN

## 2017-10-28 MED ORDER — ALPRAZOLAM 0.25 MG PO TABS: 0.2500 mg | ORAL_TABLET | Freq: Two times a day (BID) | ORAL | Status: DC | PRN

## 2017-10-28 MED ORDER — SODIUM CHLORIDE 0.9 % IV SOLN: 250.0000 mL | INTRAVENOUS | Status: DC | PRN

## 2017-10-28 MED ORDER — ENOXAPARIN SODIUM 40 MG/0.4ML ~~LOC~~ SOLN
40.0000 mg | SUBCUTANEOUS | Status: DC
  Administered 2017-10-29: 40 mg via SUBCUTANEOUS
  Filled 2017-10-28: qty 0.4

## 2017-10-28 MED ORDER — NITROGLYCERIN 0.4 MG SL SUBL: 0.4000 mg | SUBLINGUAL_TABLET | SUBLINGUAL | Status: DC | PRN

## 2017-10-28 MED ORDER — HEPARIN (PORCINE) IN NACL 2-0.9 UNITS/ML
INTRAMUSCULAR | Status: AC | PRN
  Administered 2017-10-28 (×2): 500 mL

## 2017-10-28 MED ORDER — ASPIRIN 81 MG PO CHEW: 324.0000 mg | CHEWABLE_TABLET | Freq: Once | ORAL | Status: DC

## 2017-10-28 MED ORDER — VERAPAMIL HCL 2.5 MG/ML IV SOLN
INTRAVENOUS | Status: AC
  Filled 2017-10-28: qty 2

## 2017-10-28 SURGICAL SUPPLY — 12 items

## 2017-10-28 NOTE — Interval H&P Note (Signed)
History and Physical Interval Note:  10/28/2017 5:10 PM  Colin Ramirez  has presented today for surgery, with the diagnosis of cp  The various methods of treatment have been discussed with the patient and family. After consideration of risks, benefits and other options for treatment, the patient has consented to  Procedure(s): LEFT HEART CATH AND CORONARY ANGIOGRAPHY (N/A) as a surgical intervention .  The patient's history has been reviewed, patient examined, no change in status, stable for surgery.  I have reviewed the patient's chart and labs.  Questions were answered to the patient's satisfaction.   Cath Lab Visit (complete for each Cath Lab visit)  Clinical Evaluation Leading to the Procedure:   ACS: Yes.    Non-ACS:    Anginal Classification: CCS III  Anti-ischemic medical therapy: Minimal Therapy (1 class of medications)  Non-Invasive Test Results: No non-invasive testing performed  Prior CABG: No previous CABG        Colin Ramirez [facility] 10/28/2017 5:10 PM
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
## 2017-10-28 NOTE — ED Notes (Signed)
Pt transported to cath lab with RN

## 2017-10-28 NOTE — ED Triage Notes (Signed)
Pt endorses left sided chest pain with radiation down the left arm and fatigue x 2 days, denies shob n/v. VSS

## 2017-10-28 NOTE — Progress Notes (Signed)
ANTICOAGULATION CONSULT NOTE - Initial Consult  Pharmacy Consult for Heparin Indication: chest pain/ACS  Allergies  Allergen Reactions  . Omnicef [Cefdinir] Rash   Patient Measurements: Height: 6\' 2"  (188 cm) Weight: 270 lb (122.5 kg) IBW/kg (Calculated) : 82.2 Heparin Dosing Weight: 108.7 kg  Vital Signs: Temp: 98 F (36.7 C) (05/03 1336) Temp Source: Oral (05/03 1336) BP: 140/98 (05/03 1336) Pulse Rate: 73 (05/03 1336)  Labs: Recent Labs    10/28/17 1341  HGB 15.9  HCT 47.7  PLT 257  CREATININE 0.78   Estimated Creatinine Clearance: 146.8 mL/min (by C-G formula based on SCr of 0.78 mg/dL).  Assessment: 57 yoM presenting with chest pain. Pharmacy consulted to dose IV heparin for r/o ACS. No AC PTA. CBC WNL, no bleeding noted.   Goal of Therapy:  Heparin level 0.3-0.7 units/ml Monitor platelets by anticoagulation protocol: Yes   Plan:  Give heparin 4000 units bolus x 1 Start heparin infusion at 1400 units/hr Check heparin level in 6 hours Daily heparin level and CBC Monitor s/sx of bleeding  Kendarius Vigen N. Gerarda Fraction, PharmD PGY1 Pharmacy Resident Pager: [phone] 10/28/2017,3:16 PM

## 2017-10-29 DIAGNOSIS — R079 Chest pain, unspecified: Secondary | ICD-10-CM | POA: Diagnosis not present

## 2017-10-29 DIAGNOSIS — Z720 Tobacco use: Secondary | ICD-10-CM | POA: Diagnosis not present

## 2017-10-29 DIAGNOSIS — Z9989 Dependence on other enabling machines and devices: Secondary | ICD-10-CM | POA: Diagnosis not present

## 2017-10-29 DIAGNOSIS — G4733 Obstructive sleep apnea (adult) (pediatric): Secondary | ICD-10-CM

## 2017-10-29 DIAGNOSIS — I10 Essential (primary) hypertension: Secondary | ICD-10-CM | POA: Diagnosis not present

## 2017-10-29 DIAGNOSIS — E785 Hyperlipidemia, unspecified: Secondary | ICD-10-CM | POA: Diagnosis not present

## 2017-10-29 DIAGNOSIS — E118 Type 2 diabetes mellitus with unspecified complications: Secondary | ICD-10-CM | POA: Diagnosis not present

## 2017-10-29 DIAGNOSIS — I25110 Atherosclerotic heart disease of native coronary artery with unstable angina pectoris: Secondary | ICD-10-CM | POA: Diagnosis not present

## 2017-10-29 LAB — CBC
HCT: 45.2 % (ref 39.0–52.0)
Hemoglobin: 14.9 g/dL (ref 13.0–17.0)
MCH: 29.8 pg (ref 26.0–34.0)
MCHC: 33 g/dL (ref 30.0–36.0)
MCV: 90.4 fL (ref 78.0–100.0)
Platelets: 233 10*3/uL (ref 150–400)
RBC: 5 MIL/uL (ref 4.22–5.81)
RDW: 15.3 % (ref 11.5–15.5)
WBC: 10 10*3/uL (ref 4.0–10.5)

## 2017-10-29 LAB — HIV ANTIBODY (ROUTINE TESTING W REFLEX): HIV Screen 4th Generation wRfx: NONREACTIVE

## 2017-10-29 LAB — LIPID PANEL
Cholesterol: 108 mg/dL (ref 0–200)
HDL: 24 mg/dL — ABNORMAL LOW (ref >40)
LDL CALC: 59 mg/dL (ref 0–99)
TRIGLYCERIDES: 124 mg/dL (ref <150)
Total CHOL/HDL Ratio: 4.5 RATIO
VLDL: 25 mg/dL (ref 0–40)

## 2017-10-29 LAB — BASIC METABOLIC PANEL
Anion gap: 10 (ref 5–15)
BUN: 14 mg/dL (ref 6–20)
CHLORIDE: 99 mmol/L — AB (ref 101–111)
CO2: 30 mmol/L (ref 22–32)
CREATININE: 0.92 mg/dL (ref 0.61–1.24)
Calcium: 8.8 mg/dL — ABNORMAL LOW (ref 8.9–10.3)
GFR calc Af Amer: >60 mL/min (ref >60)
GFR calc non Af Amer: >60 mL/min (ref >60)
Glucose, Bld: 114 mg/dL — ABNORMAL HIGH (ref 65–99)
Potassium: 3.8 mmol/L (ref 3.5–5.1)
Sodium: 139 mmol/L (ref 135–145)

## 2017-10-29 LAB — GLUCOSE, CAPILLARY: Glucose-Capillary: 116 mg/dL — ABNORMAL HIGH (ref 65–99)

## 2017-10-29 MED ORDER — CARVEDILOL 3.125 MG PO TABS: 3.1250 mg | ORAL_TABLET | Freq: Two times a day (BID) | ORAL | 2 refills | Status: DC

## 2017-10-29 MED ORDER — METFORMIN HCL ER 500 MG PO TB24: 1000.0000 mg | ORAL_TABLET | Freq: Two times a day (BID) | ORAL | 0 refills | Status: DC

## 2017-10-29 MED ORDER — ASPIRIN EC 81 MG PO TBEC: 81.0000 mg | DELAYED_RELEASE_TABLET | Freq: Every day | ORAL | 2 refills | Status: DC

## 2017-10-29 MED ORDER — NITROGLYCERIN 0.4 MG SL SUBL: 0.4000 mg | SUBLINGUAL_TABLET | SUBLINGUAL | 12 refills | Status: DC | PRN

## 2017-10-29 NOTE — Progress Notes (Signed)
TRB taken off. No bleeding noted. Radial .+3 pulses,02 sat rt hand 98%  tagaderm dsg. Continued to monitor for bleeding

## 2017-10-29 NOTE — Discharge Summary (Addendum)
Discharge Summary    Patient ID: Colin Ramirez  MRN: [id number], DOB/AGE: 02/17/1966 52 y.o.  Admit Date: 10/28/2017 Discharge Date: 10/29/2017  Primary Care Provider: Leonie Douglas, PA-C Primary Cardiologist: Dr. Radford Pax, MD (last saw 2015)  Discharge Diagnoses    Active Problems:   Chest pain   Unstable angina (HCC)   Allergies Allergies  Allergen Reactions  . Omnicef [Cefdinir] Rash     History of Present Illness     52 year old male with DM2, HTN, HLD, ongoing tobacco abuse at 2 packs daily, and a strong family history of CAD who was admitted to [facility] on 10/27/2017 with left sided chest pain and fatigue.   Hospital Course     Consultants: none  Patient admitted with a 2-day history of left sided chest pain and fatigue that had been relatively constant and was worse with movement and deep inspiration. POC troponin in the ED was 0.23 with subsequent value ran in the lab being negative. EKG showed no acute changes. He underwent LHC on 10/28/2017 that showed 2-vessel CAD with moderate stenosis of the mid LAD immediately after the takeoff of 2 large diagonal branches as well as modest disease of the distal LCx. See below for full cath details. It was felt, based on the cath findings that his presenting symptoms were not ischemic in nature and that initial aggressive medical therapy should be undertaken with optimization of antianginal therapy and aggressive risk factor modification including cessation of tobacco abuse. It was recommended to pursue outpatient ischemic testing via stress test following optimization of medical therapy. If he were found to have significant ischemic burden or have active angina on medical therapy, consideration of stenting of the LAD would be considered, though this would require stenting across the two diagonal branches. Post cath labs remained stable. LDL 59, A1c 6.8. Vitals stable. He has bene advised to hold metformin until 10/31/2017.   The  patient's right radial cath site has been examined and is healing well without issues at this time. The patient has been seen by Dr. Bronson Ing, MD and felt to be stable for discharge today. All follow up appointments have been made. Discharge medications are listed below. Prescriptions have been reviewed with the patient and sent in to their pharmacy.  _____________  Discharge Vitals Blood pressure (!) 127/91, pulse (!) 58, temperature (!) 97.5 F (36.4 C), temperature source Oral, resp. rate 18, height 6\' 2"  (1.88 m), weight 266 lb 3.2 oz (120.7 kg), SpO2 97 %.  Filed Weights   10/28/17 1336 10/29/17 0356  Weight: 270 lb (122.5 kg) 266 lb 3.2 oz (120.7 kg)    Labs & Radiologic Studies    CBC Recent Labs    10/28/17 1341 10/29/17 0650  WBC 11.0* 10.0  HGB 15.9 14.9  HCT 47.7 45.2  MCV 90.0 90.4  PLT 257 671   Basic Metabolic Panel Recent Labs    10/28/17 1341 10/29/17 0650  NA 141 139  K 3.8 3.8  CL 102 99*  CO2 28 30  GLUCOSE 105* 114*  BUN 11 14  CREATININE 0.78 0.92  CALCIUM 9.3 8.8*   Liver Function Tests No results for input(s): AST, ALT, ALKPHOS, BILITOT, PROT, ALBUMIN in the last 72 hours. No results for input(s): LIPASE, AMYLASE in the last 72 hours. Cardiac Enzymes Recent Labs    10/28/17 1505  TROPONINI <0.03   BNP Invalid input(s): POCBNP D-Dimer No results for input(s): DDIMER in the last 72 hours. Hemoglobin A1C  Recent Labs    10/28/17 1852  HGBA1C 6.8*   Fasting Lipid Panel Recent Labs    10/29/17 0650  CHOL 108  HDL 24*  LDLCALC 59  TRIG 124  CHOLHDL 4.5   Thyroid Function Tests No results for input(s): TSH, T4TOTAL, T3FREE, THYROIDAB in the last 72 hours.  Invalid input(s): FREET3 _____________  Dg Chest 2 View  Result Date: 10/28/2017 CLINICAL DATA:  Left-sided chest pain. EXAM: CHEST - 2 VIEW COMPARISON:  Chest x-ray dated May 14, 2017. FINDINGS: The heart size and mediastinal contours are within normal limits. Normal  pulmonary vascularity. No focal consolidation, pleural effusion, or pneumothorax. No acute osseous abnormality. IMPRESSION: No active cardiopulmonary disease. Electronically Signed   By: Titus Dubin M.D.   On: 10/28/2017 14:55    Diagnostic Studies/Procedures   LHC 10/28/2017: Coronary Findings   Diagnostic  Dominance: Right  Left Anterior Descending  Prox LAD to Mid LAD lesion 75% stenosed  Prox LAD to Mid LAD lesion is 75% stenosed. The lesion is distal to major branch.  First Diagonal Branch  Ost 1st Diag to 1st Diag lesion 30% stenosed  Ost 1st Diag to 1st Diag lesion is 30% stenosed.  Second Diagonal [employer] 2nd Diag to 2nd Diag lesion 30% stenosed  Ost 2nd Diag to 2nd Diag lesion is 30% stenosed.  Left Circumflex  Mid Cx to Dist Cx lesion 70% stenosed  Mid Cx to Dist Cx lesion is 70% stenosed.  First Obtuse Marginal Branch  Vessel is small in size.  Second Obtuse Marginal Branch  Ost 2nd Mrg to 2nd Mrg lesion 60% stenosed  Ost 2nd Mrg to 2nd Mrg lesion is 60% stenosed.  Right Coronary Artery  Dist RCA lesion 45% stenosed  Dist RCA lesion is 45% stenosed.  Intervention   No interventions have been documented.  Wall Motion              Left Heart   Left Ventricle The left ventricular size is normal. The left ventricular systolic function is normal. LV end diastolic pressure is normal. The left ventricular ejection fraction is greater than 65% by visual estimate. No regional wall motion abnormalities.  Coronary Diagrams   Diagnostic Diagram        Conclusion     Dist RCA lesion is 45% stenosed.  Ost 2nd Mrg to 2nd Mrg lesion is 60% stenosed.  Mid Cx to Dist Cx lesion is 70% stenosed.  Prox LAD to Mid LAD lesion is 75% stenosed.  Ost 1st Diag to 1st Diag lesion is 30% stenosed.  Ost 2nd Diag to 2nd Diag lesion is 30% stenosed.  The left ventricular ejection fraction is greater than 65% by visual estimate.  The left ventricular systolic  function is normal.  LV end diastolic pressure is normal.   1. 2 vessel obstructive CAD. There is moderate stenosis in the mid LAD immediately after the takeoff of 2 large diagonal branches. There is modest disease in the distal LCx.  2. Normal LV function 3. Normal LVEDP  Plan: Based on angiographic findings I do not think his presenting symptoms are ischemic in nature. I would recommend initial aggressive medical therapy for his CAD. Need to optimize antianginal therapy. Aggressive risk factor modification including smoking cessation. Consider alternative diagnosis for his current chest pain. It may be beneficial to assess ischemic risk with outpatient stress test once medical therapy has been optimized. If he does have significant ischemic burden or active anginal symptoms on medical therapy we  could consider stenting of the LAD but this would likely require stenting across the two diagonal branches.      _____________  Disposition   Pt is being discharged home today in good condition.  Follow-up Plans & Appointments    Follow-up Information    Sueanne Margarita, MD Follow up.   Specialty:  Cardiology Why:  Our office has been messaged to schedule patient with Dr. Radford Pax or an APP within 10-14 days. Contact information: [address] [place] [address] [phone]          Discharge Instructions    Diet - low sodium heart healthy   Complete by:  As directed    Increase activity slowly   Complete by:  As directed       Discharge Medications   Allergies as of 10/29/2017      Reactions   Omnicef [cefdinir] Rash      Medication List    TAKE these medications   amLODipine 10 MG tablet Commonly known as:  NORVASC TAKE 1 TABLET DAILY   aspirin EC 81 MG tablet Take 1 tablet (81 mg total) by mouth daily.   atorvastatin 40 MG tablet Commonly known as:  LIPITOR TAKE 1 TABLET DAILY  Needs ov for additional refills   BC FAST PAIN RELIEF [phone]  MG Pack Generic drug:  Aspirin-Salicylamide-Caffeine Take 1 packet by mouth every 8 (eight) hours as needed (for pain.).   carvedilol 3.125 MG tablet Commonly known as:  COREG Take 1 tablet (3.125 mg total) by mouth 2 (two) times daily with a meal.   esomeprazole 40 MG capsule Commonly known as:  NEXIUM TAKE 1 CAPSULE (40 MG TOTAL) BY MOUTH DAILY.   glipiZIDE 5 MG tablet Commonly known as:  GLUCOTROL Take 1 tablet (5 mg total) by mouth daily before breakfast. Office visit needed for additional refills   hydrochlorothiazide 25 MG tablet Commonly known as:  HYDRODIURIL TAKE 1 TABLET DAILY   levothyroxine 125 MCG tablet Commonly known as:  SYNTHROID, LEVOTHROID Take 1 tablet (125 mcg total) by mouth daily.   metFORMIN 500 MG 24 hr tablet Commonly known as:  GLUCOPHAGE-XR Take 2 tablets (1,000 mg total) by mouth 2 (two) times daily with a meal. Needs office visit for additional refills   methocarbamol 500 MG tablet Commonly known as:  ROBAXIN Take 1 tablet (500 mg total) by mouth every 6 (six) hours as needed for muscle spasms.   nitroGLYCERIN 0.4 MG SL tablet Commonly known as:  NITROSTAT Place 1 tablet (0.4 mg total) under the tongue every 5 (five) minutes as needed for chest pain.   oxymorphone 10 MG tablet Commonly known as:  OPANA Take 10 mg by mouth every 6 (six) hours as needed for pain.   ramipril 10 MG capsule Commonly known as:  ALTACE Take 1 capsule (10 mg total) by mouth daily.   sitaGLIPtin 100 MG tablet Commonly known as:  JANUVIA Take 1 tablet (100 mg total) by mouth daily. Office visit needed for additional refills        Aspirin prescribed at discharge?  Yes High Intensity Statin Prescribed? (Lipitor 40-80mg  or Crestor 20-40mg ): Yes Beta Blocker Prescribed? Yes For EF <40%, was ACEI/ARB Prescribed? Yes ADP Receptor Inhibitor Prescribed? (i.e. Plavix etc.-Includes Medically Managed Patients): No: without intervention For EF <40%, Aldosterone  Inhibitor Prescribed? No: EF > 40% Was EF assessed during THIS hospitalization? Yes Was Cardiac Rehab II ordered? (Included Medically managed Patients): Yes   Outstanding Labs/Studies  None.   Duration of Discharge Encounter   Greater than 30 minutes including physician time.  Signed, Rise Mu, PA-C [facility] Pager: [phone] 10/29/2017, 9:31 AM

## 2017-10-29 NOTE — Progress Notes (Signed)
Progress Note  Patient Name: Colin Ramirez Date of Encounter: 10/29/2017  Primary Cardiologist: Fransico Him, MD (last seen in 2015)  Subjective   Denies chest pain and shortness of breath.  Inpatient Medications    Scheduled Meds: . amLODipine  10 mg Oral Daily  . aspirin  324 mg Oral Once  . atorvastatin  40 mg Oral q1800  . carvedilol  3.125 mg Oral BID WC  . enoxaparin (LOVENOX) injection  40 mg Subcutaneous Q24H  . glipiZIDE  5 mg Oral QAC breakfast  . hydrochlorothiazide  25 mg Oral Daily  . insulin aspart  0-15 Units Subcutaneous TID WC  . insulin aspart  0-5 Units Subcutaneous QHS  . levothyroxine  125 mcg Oral QAC breakfast  . ramipril  10 mg Oral Daily  . sodium chloride flush  3 mL Intravenous Q12H   Continuous Infusions: . sodium chloride     PRN Meds: sodium chloride, acetaminophen, ALPRAZolam, methocarbamol, nitroGLYCERIN, ondansetron (ZOFRAN) IV, oxymorphone, sodium chloride flush, sodium chloride flush, zolpidem   Vital Signs    Vitals:   10/28/17 1820 10/28/17 2242 10/29/17 0010 10/29/17 0356  BP: 132/89  113/76 (!) 127/91  Pulse: 65 68 65 (!) 58  Resp:  16 18 18   Temp:   97.7 F (36.5 C) (!) 97.5 F (36.4 C)  TempSrc:   Oral Oral  SpO2:  92% 93% 97%  Weight:    266 lb 3.2 oz (120.7 kg)  Height:        Intake/Output Summary (Last 24 hours) at 10/29/2017 0827 Last data filed at 10/29/2017 0554 Gross per 24 hour  Intake 360 ml  Output 700 ml  Net -340 ml   Filed Weights   10/28/17 1336 10/29/17 0356  Weight: 270 lb (122.5 kg) 266 lb 3.2 oz (120.7 kg)    Telemetry    NSR - Personally Reviewed  ECG    n/a - Personally Reviewed  Physical Exam   GEN: No acute distress.   Neck: No JVD Cardiac: RRR, no murmurs, rubs, or gallops.  Respiratory: Clear to auscultation bilaterally. GI: Soft, nontender, non-distended  MS: No edema; No deformity. Neuro:  Nonfocal  Psych: Normal affect   Labs    Chemistry Recent Labs  Lab  10/28/17 1341 10/29/17 0650  NA 141 139  K 3.8 3.8  CL 102 99*  CO2 28 30  GLUCOSE 105* 114*  BUN 11 14  CREATININE 0.78 0.92  CALCIUM 9.3 8.8*  GFRNONAA >60 >60  GFRAA >60 >60  ANIONGAP 11 10     Hematology Recent Labs  Lab 10/28/17 1341 10/29/17 0650  WBC 11.0* 10.0  RBC 5.30 5.00  HGB 15.9 14.9  HCT 47.7 45.2  MCV 90.0 90.4  MCH 30.0 29.8  MCHC 33.3 33.0  RDW 15.1 15.3  PLT 257 233    Cardiac Enzymes Recent Labs  Lab 10/28/17 1505  TROPONINI <0.03    Recent Labs  Lab 10/28/17 1354  TROPIPOC 0.23*     BNPNo results for input(s): BNP, PROBNP in the last 168 hours.   DDimer No results for input(s): DDIMER in the last 168 hours.   Radiology    Dg Chest 2 View  Result Date: 10/28/2017 CLINICAL DATA:  Left-sided chest pain. EXAM: CHEST - 2 VIEW COMPARISON:  Chest x-ray dated May 14, 2017. FINDINGS: The heart size and mediastinal contours are within normal limits. Normal pulmonary vascularity. No focal consolidation, pleural effusion, or pneumothorax. No acute osseous abnormality. IMPRESSION: No active  cardiopulmonary disease. Electronically Signed   By: Titus Dubin M.D.   On: 10/28/2017 14:55    Cardiac Studies   Cath (10/28/17):   Dist RCA lesion is 45% stenosed.  Ost 2nd Mrg to 2nd Mrg lesion is 60% stenosed.  Mid Cx to Dist Cx lesion is 70% stenosed.  Prox LAD to Mid LAD lesion is 75% stenosed.  Ost 1st Diag to 1st Diag lesion is 30% stenosed.  Ost 2nd Diag to 2nd Diag lesion is 30% stenosed.  The left ventricular ejection fraction is greater than 65% by visual estimate.  The left ventricular systolic function is normal.  LV end diastolic pressure is normal.   1. 2 vessel obstructive CAD. There is moderate stenosis in the mid LAD immediately after the takeoff of 2 large diagonal branches. There is modest disease in the distal LCx.  2. Normal LV function 3. Normal LVEDP  Plan: Based on angiographic findings I do not think his  presenting symptoms are ischemic in nature. I would recommend initial aggressive medical therapy for his CAD. Need to optimize antianginal therapy. Aggressive risk factor modification including smoking cessation. Consider alternative diagnosis for his current chest pain. It may be beneficial to assess ischemic risk with outpatient stress test once medical therapy has been optimized. If he does have significant ischemic burden or active anginal symptoms on medical therapy we could consider stenting of the LAD but this would likely require stenting across the two diagonal branches.    Patient Profile     52 y.o. male with a history of HTN, DM, HLD, obesity, 2PPD smoker, and a strong FM Hx of CAD admitted for chest pain and elevated troponin.  Assessment & Plan    1. Chest pain with 2 vessel CAD: Symptomatically stable. Subsequent troponin was negative. Cath reviewed above. Plan to optimize medical therapy and potentially consider an outpatient stress test afterwards if he continues to experience symptoms. Recommend ASA 81 mg and Coreg has been initiated. Could add long-acting nitrates in the future if deemed necessary. Needs aggressive risk factor modification including weight loss and tobacco cessation.  2. Hypertension: DBP mildly elevated. Needs weight loss which will help control BP. Needs further outpatient monitoring.  3. Hyperlipidemia: LDL 59. Continue Lipitor.  4. Type 2 DM: A1C 6.8%. Meds to be managed by PCP. Takes glipizide, metformin, and Januvia.  5. Morbid obesity: Needs aggressive weight loss.  6. OSA: On CPAP.  7. Tobacco abuse: Needs cessation.  Dispo: Discharge to home today.  For questions or updates, please contact [place] Please consult [email or web address] for contact info under Cardiology/STEMI.      Signed, Kate Sable, MD  10/29/2017, 8:27 AM

## 2017-10-29 NOTE — Plan of Care (Signed)
  Problem: Clinical Measurements: Goal: Diagnostic test results will improve Outcome: Progressing   Problem: Clinical Measurements: Goal: Cardiovascular complication will be avoided Outcome: Progressing   Problem: Safety: Goal: Ability to remain free from injury will improve Outcome: Progressing   

## 2017-10-29 NOTE — Progress Notes (Signed)
Patient and his wife left the unit before discharge instructions were given. They said they will be back.

## 2017-10-29 NOTE — Progress Notes (Signed)
Patient is back on floor with his wife discharge instructions were given. All questions were answered.

## 2017-10-31 ENCOUNTER — Encounter (HOSPITAL_COMMUNITY): Payer: Self-pay | Admitting: Cardiology

## 2017-10-31 ENCOUNTER — Encounter: Payer: Self-pay | Admitting: Internal Medicine

## 2017-11-01 ENCOUNTER — Encounter: Payer: Self-pay | Admitting: Physician Assistant

## 2017-11-01 MED FILL — Heparin Sod (Porcine)-NaCl IV Soln 1000 Unit/500ML-0.9%: INTRAVENOUS | Qty: 1000 | Status: AC
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
## 2017-11-03 ENCOUNTER — Ambulatory Visit: Payer: Medicare Other | Admitting: Physician Assistant

## 2017-11-03 DIAGNOSIS — R0989 Other specified symptoms and signs involving the circulatory and respiratory systems: Secondary | ICD-10-CM

## 2017-11-09 ENCOUNTER — Other Ambulatory Visit: Payer: Self-pay

## 2017-11-09 ENCOUNTER — Telehealth: Payer: Self-pay | Admitting: Cardiology

## 2017-11-09 ENCOUNTER — Ambulatory Visit (INDEPENDENT_AMBULATORY_CARE_PROVIDER_SITE_OTHER): Payer: 59 | Admitting: Physician Assistant

## 2017-11-09 ENCOUNTER — Encounter: Payer: Self-pay | Admitting: Physician Assistant

## 2017-11-09 VITALS — BP 123/80 | HR 74 | Temp 97.9°F | Resp 18 | Ht 73.47 in | Wt 270.4 lb

## 2017-11-09 DIAGNOSIS — E785 Hyperlipidemia, unspecified: Secondary | ICD-10-CM | POA: Diagnosis not present

## 2017-11-09 DIAGNOSIS — I10 Essential (primary) hypertension: Secondary | ICD-10-CM

## 2017-11-09 DIAGNOSIS — Z09 Encounter for follow-up examination after completed treatment for conditions other than malignant neoplasm: Secondary | ICD-10-CM

## 2017-11-09 DIAGNOSIS — E119 Type 2 diabetes mellitus without complications: Secondary | ICD-10-CM | POA: Diagnosis not present

## 2017-11-09 DIAGNOSIS — I2510 Atherosclerotic heart disease of native coronary artery without angina pectoris: Secondary | ICD-10-CM

## 2017-11-09 DIAGNOSIS — G4733 Obstructive sleep apnea (adult) (pediatric): Secondary | ICD-10-CM | POA: Diagnosis not present

## 2017-11-09 DIAGNOSIS — Z720 Tobacco use: Secondary | ICD-10-CM | POA: Diagnosis not present

## 2017-11-09 DIAGNOSIS — E039 Hypothyroidism, unspecified: Secondary | ICD-10-CM

## 2017-11-09 DIAGNOSIS — Z6835 Body mass index (BMI) 35.0-35.9, adult: Secondary | ICD-10-CM | POA: Diagnosis not present

## 2017-11-09 MED ORDER — DAPAGLIFLOZIN PROPANEDIOL 5 MG PO TABS: 5.0000 mg | ORAL_TABLET | Freq: Every day | ORAL | 0 refills | Status: DC

## 2017-11-09 NOTE — Progress Notes (Signed)
SAMYAK SACKMANN  MRN: [id number] DOB: [birth year]  Subjective:  Colin Ramirez is a 52 y.o. male w/ PMH of obesity, T2DM, HTN, HLD, OSA, hypothyroidism, and  tobacco use seen in office today for a chief complaint of hospital f/u. Pt seen in ED on 10/27/17 for left sided chest pain. POC trop elevated at 0.23, subs lab trop negative, EKG w/ no acute findings. He was admitted. Underwent LHC which showed 2-vessel CAD with moderate stenosis of the mid LAD immediately after the takeoff of 2 large diagonal branches as well as modest disease of the distal LCx. Due to findings, it was felt that sx were no ischemic in nature and that initial aggressive medical therapy should be started with optimization of antianginal therapy and aggressive risk factor modification to include smoking cessation. Recommend outpt stress test after medication optimization to determine if they need to pursure with LAD. He was started on carvedilol 3.125 mg BID, asa 81mg  daily, and nitrostat 0.4mg  prn. Please see discharge note for further details.  Today, pt reports he has not had any sx since admission. He is doing great and tolerating new medications well. He has not scheduled f/u appt with cardiology. Has not cut down on smoking. Needs Rx for CPAP mask and tubing. No other questions or concerns today.   Review of Systems  Constitutional: Negative for chills, diaphoresis, fatigue and fever.  Eyes: Negative for visual disturbance.  Respiratory: Negative for shortness of breath.   Cardiovascular: Negative for chest pain, palpitations and leg swelling.  Gastrointestinal: Negative for nausea and vomiting.  Neurological: Negative for dizziness, light-headedness and headaches.    Patient Active Problem List   Diagnosis Date Noted  . Unstable angina ([place]) 10/28/2017  . Unilateral primary osteoarthritis, left hip 08/20/2016  . Status post left hip replacement 08/20/2016  . Chest pain 10/30/2013  . Unspecified hypothyroidism  08/19/2012  . BMI 37.0-37.9,adult 05/17/2012  . Obesity, Class II, BMI 35-39.9, with comorbidity 12/02/2011  . Degenerative disc disease 12/02/2011  . Tobacco user 12/02/2011  . Diabetes mellitus ([place]) 11/08/2011  . HTN (hypertension) 11/08/2011  . Hyperlipidemia 11/08/2011  . GERD (gastroesophageal reflux disease) 11/08/2011  . Sleep apnea 11/08/2011    Current Outpatient Medications on File Prior to Visit  Medication Sig Dispense Refill  . amLODipine (NORVASC) 10 MG tablet TAKE 1 TABLET DAILY 80 tablet 0  . aspirin EC 81 MG tablet Take 1 tablet (81 mg total) by mouth daily. 30 tablet 2  . Aspirin-Salicylamide-Caffeine (BC FAST PAIN RELIEF) 650-195-33.3 MG PACK Take 1 packet by mouth every 8 (eight) hours as needed (for pain.).    [name] atorvastatin (LIPITOR) 40 MG tablet TAKE 1 TABLET DAILY  Needs ov for additional refills 90 tablet 0  . carvedilol (COREG) 3.125 MG tablet Take 1 tablet (3.125 mg total) by mouth 2 (two) times daily with a meal. 60 tablet 2  . esomeprazole (NEXIUM) 40 MG capsule TAKE 1 CAPSULE (40 MG TOTAL) BY MOUTH DAILY. 90 capsule 3  . glipiZIDE (GLUCOTROL) 5 MG tablet Take 1 tablet (5 mg total) by mouth daily before breakfast. Office visit needed for additional refills 80 tablet 0  . hydrochlorothiazide (HYDRODIURIL) 25 MG tablet TAKE 1 TABLET DAILY 80 tablet 1  . levothyroxine (SYNTHROID, LEVOTHROID) 125 MCG tablet Take 1 tablet (125 mcg total) by mouth daily. 90 tablet 0  . metFORMIN (GLUCOPHAGE-XR) 500 MG 24 hr tablet Take 2 tablets (1,000 mg total) by mouth 2 (two) times daily with a meal. Needs  office visit for additional refills 360 tablet 0  . methocarbamol (ROBAXIN) 500 MG tablet Take 1 tablet (500 mg total) by mouth every 6 (six) hours as needed for muscle spasms. 30 tablet 2  . nitroGLYCERIN (NITROSTAT) 0.4 MG SL tablet Place 1 tablet (0.4 mg total) under the tongue every 5 (five) minutes as needed for chest pain. 25 tablet 12  . oxymorphone (OPANA) 10 MG tablet  Take 10 mg by mouth every 6 (six) hours as needed for pain.    . ramipril (ALTACE) 10 MG capsule Take 1 capsule (10 mg total) by mouth daily. 90 capsule 1  . sitaGLIPtin (JANUVIA) 100 MG tablet Take 1 tablet (100 mg total) by mouth daily. Office visit needed for additional refills 90 tablet 0   No current facility-administered medications on file prior to visit.     Allergies  Allergen Reactions  . Omnicef [Cefdinir] Rash      Social History   Socioeconomic History  . Marital status: Married    Spouse name: [name]  . Number of children: 1  . Years of education: Not on file  . Highest education level: Not on file  Occupational History  . Not on file  Social Needs  . Financial resource strain: Not on file  . Food insecurity:    Worry: Not on file    Inability: Not on file  . Transportation needs:    Medical: Not on file    Non-medical: Not on file  Tobacco Use  . Smoking status: Current Every Day Smoker    Packs/day: 2.00    Years: 25.00    Pack years: 50.00    Types: Cigarettes  . Smokeless tobacco: Never Used  Substance and Sexual Activity  . Alcohol use: No    Alcohol/week: 0.0 oz  . Drug use: No  . Sexual activity: Yes  Lifestyle  . Physical activity:    Days per week: Not on file    Minutes per session: Not on file  . Stress: Not on file  Relationships  . Social connections:    Talks on phone: Not on file    Gets together: Not on file    Attends religious service: Not on file    Active member of club or organization: Not on file    Attends meetings of clubs or organizations: Not on file    Relationship status: Not on file  . Intimate partner violence:    Fear of current or ex partner: Not on file    Emotionally abused: Not on file    Physically abused: Not on file    Forced sexual activity: Not on file  Other Topics Concern  . Not on file  Social History Narrative  . Not on file    Objective:  BP 123/80 (BP Location: Right Arm, Patient Position:  Sitting, Cuff Size: Large)   Pulse 74   Temp 97.9 F (36.6 C) (Oral)   Resp 18   Ht 6' 1.47" (1.866 m)   Wt 270 lb 6.4 oz (122.7 kg)   SpO2 96%   BMI 35.22 kg/m   Physical Exam  Constitutional: He is oriented to person, place, and time. He appears well-developed and well-nourished. No distress.  HENT:  Head: Normocephalic and atraumatic.  Mouth/Throat: Uvula is midline, oropharynx is clear and moist and mucous membranes are normal. No tonsillar exudate.  Eyes: Pupils are equal, round, and reactive to light. Conjunctivae and EOM are normal.  Neck: Normal range of motion.  Cardiovascular:  Normal rate, regular rhythm, normal heart sounds and intact distal pulses.  Pulmonary/Chest: Effort normal and breath sounds normal. He has no decreased breath sounds. He has no wheezes. He has no rhonchi. He has no rales.  Musculoskeletal:       Right lower leg: He exhibits no swelling.       Left lower leg: He exhibits no swelling.  Neurological: He is alert and oriented to person, place, and time.  Skin: Skin is warm and dry.  Psychiatric: He has a normal mood and affect.  Vitals reviewed.    Wt Readings from Last 3 Encounters:  11/09/17 270 lb 6.4 oz (122.7 kg)  10/29/17 266 lb 3.2 oz (120.7 kg)  09/22/17 270 lb 3.2 oz (122.6 kg)     Assessment and Plan :  1. 2-vessel coronary artery disease Pt is doing well. He is asx today. Has not had any issues since discharge. Tolerating meds well. [facility] to [facility] appointment. They were unavailable to set up appointment today but will contact pt with available appointments. Follow up with them as planned.   2. Type 2 diabetes mellitus without complication, without long-term current use of insulin (HCC) A1C 2 wks ago controlled at 6.8. Rec d/c januvia and start farxiga to hopefully help with wt loss.  Decrease glipizide to half tablet daily. Continue metformin. Check FBS, gaol is 90-110. GFR as of 10/29/17 was >60. Follow  up in ~6 weeks for reevaluation.  - Basic metabolic panel - dapagliflozin propanediol (FARXIGA) 5 MG TABS tablet; Take 5 mg by mouth daily.  Dispense: 90 tablet; Refill: 0  3. Essential hypertension Well controlled, labs pending.  - Basic metabolic panel  4. Tobacco user DECLINES smoking cessation counseling.  5. Obstructive sleep apnea syndrome - PR CPAP FULL FACE MASK  6. Hypothyroidism, unspecified type - TSH  7. Class 2 severe obesity with serious comorbidity and body mass index (BMI) of 35.0 to 35.9 in adult, unspecified obesity type ([place])  8. Hyperlipidemia, unspecified hyperlipidemia type  9. Hospital discharge follow-up  Side effects, risks, benefits, and alternatives of the medications and treatment plan prescribed today were discussed, and patient expressed understanding of the instructions given. No barriers to understanding were identified. Red flags discussed in detail. Pt expressed understanding regarding what to do in case of emergency/urgent symptoms.   Tenna Delaine PA-C  [facility] 11/09/2017 1:57 PM

## 2017-11-09 NOTE — Telephone Encounter (Signed)
Linda calling from Dr Timmothy Euler office   Stating to call the pt to sched an appt within 4 days with Dr Radford Pax due to heart cath pt had. Pt was suppose to follow up in 10 days. Please call pt to sched appt per Dr Timmothy Euler pt's PCP

## 2017-11-09 NOTE — Patient Instructions (Addendum)
For diabetes, stop [place]. Start farxiga. Decrease glipizide to half of a tablet. Continue with metformin. Check fasting blood sugar. Goal is 90-110. If your fasting blood sugars drop below 70, contact our office.. Signs of low blood sugar are lightheadedness, blurred vision, headache, and confusion.    Follow up with cardiologist as planned.   Return here in one month. Thank you for letting me participate in your health and well being.     IF you received an x-ray today, you will receive an invoice from [facility]. Please contact [facility] at [phone] with questions or concerns regarding your invoice.   IF you received labwork today, you will receive an invoice from [facility]. Please contact [facility] at [phone] with questions or concerns regarding your invoice.   Our billing staff will not be able to assist you with questions regarding bills from these companies.  You will be contacted with the lab results as soon as they are available. The fastest way to get your results is to activate your My Chart account. Instructions are located on the last page of this paperwork. If you have not heard from [place] regarding the results in 2 weeks, please contact this office.

## 2017-11-10 LAB — BASIC METABOLIC PANEL
BUN/Creatinine Ratio: 15 (ref 9–20)
BUN: 11 mg/dL (ref 6–24)
CALCIUM: 9.4 mg/dL (ref 8.7–10.2)
CO2: 22 mmol/L (ref 20–29)
Chloride: 101 mmol/L (ref 96–106)
Creatinine, Ser: 0.73 mg/dL — ABNORMAL LOW (ref 0.76–1.27)
GFR calc Af Amer: 122 mL/min/{1.73_m2} (ref >59)
GFR, EST NON AFRICAN AMERICAN: 105 mL/min/{1.73_m2} (ref >59)
GLUCOSE: 145 mg/dL — AB (ref 65–99)
POTASSIUM: 4 mmol/L (ref 3.5–5.2)
Sodium: 141 mmol/L (ref 134–144)

## 2017-11-10 LAB — TSH: TSH: 4.25 u[IU]/mL (ref 0.450–4.500)

## 2017-11-11 NOTE — Telephone Encounter (Signed)
Patient is scheduled with Richardson Dopp, PA on 11/18/17. Patient confirmed appt and thankful for the call

## 2017-11-14 ENCOUNTER — Encounter: Payer: Self-pay | Admitting: Physician Assistant

## 2017-11-18 ENCOUNTER — Encounter: Payer: Self-pay | Admitting: Physician Assistant

## 2017-11-18 ENCOUNTER — Ambulatory Visit (INDEPENDENT_AMBULATORY_CARE_PROVIDER_SITE_OTHER): Payer: 59 | Admitting: Physician Assistant

## 2017-11-18 ENCOUNTER — Encounter

## 2017-11-18 VITALS — BP 134/82 | HR 81 | Ht 72.0 in | Wt 268.0 lb

## 2017-11-18 DIAGNOSIS — E785 Hyperlipidemia, unspecified: Secondary | ICD-10-CM | POA: Diagnosis not present

## 2017-11-18 DIAGNOSIS — I25119 Atherosclerotic heart disease of native coronary artery with unspecified angina pectoris: Secondary | ICD-10-CM | POA: Diagnosis not present

## 2017-11-18 DIAGNOSIS — G4733 Obstructive sleep apnea (adult) (pediatric): Secondary | ICD-10-CM | POA: Diagnosis not present

## 2017-11-18 DIAGNOSIS — I10 Essential (primary) hypertension: Secondary | ICD-10-CM

## 2017-11-18 DIAGNOSIS — Z720 Tobacco use: Secondary | ICD-10-CM

## 2017-11-18 DIAGNOSIS — I2510 Atherosclerotic heart disease of native coronary artery without angina pectoris: Secondary | ICD-10-CM

## 2017-11-18 MED ORDER — CARVEDILOL 6.25 MG PO TABS: 6.2500 mg | ORAL_TABLET | Freq: Two times a day (BID) | ORAL | 3 refills | Status: DC
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
## 2017-11-18 NOTE — Patient Instructions (Addendum)
Medication Instructions:  1. INCREASE COREG TO 6.25 MG TWICE DAILY; NEW RX HAS BEEN SENT IN FOR THE 6.25 MG TABLET  Labwork: NONE ORDERED TODAY  Testing/Procedures: NONE ORDERED TODAY  Follow-Up: Richardson Dopp, [facility] ON December 19, 2017 @ 11:15 AM   Any Other Special Instructions Will Be Listed Below (If Applicable).     If you need a refill on your cardiac medications before your next appointment, please call your pharmacy.

## 2017-11-20 ENCOUNTER — Other Ambulatory Visit: Payer: Self-pay | Admitting: Physician Assistant

## 2017-11-20 DIAGNOSIS — E119 Type 2 diabetes mellitus without complications: Secondary | ICD-10-CM

## 2017-11-20 DIAGNOSIS — I10 Essential (primary) hypertension: Secondary | ICD-10-CM

## 2017-11-20 DIAGNOSIS — E785 Hyperlipidemia, unspecified: Secondary | ICD-10-CM

## 2017-11-22 MED ORDER — CARVEDILOL 6.25 MG PO TABS: 6.2500 mg | ORAL_TABLET | Freq: Two times a day (BID) | ORAL | 3 refills | Status: DC

## 2017-11-22 NOTE — Telephone Encounter (Signed)
Please review for refill, Thanks !  

## 2017-11-22 NOTE — Telephone Encounter (Signed)
CARVEDILOL 6.25 MG TABLET was taken off pt's medication list. Pt's pharmacy is requesting a refill on this medication. Please address

## 2017-12-10 ENCOUNTER — Other Ambulatory Visit: Payer: Self-pay | Admitting: Physician Assistant

## 2017-12-10 DIAGNOSIS — E118 Type 2 diabetes mellitus with unspecified complications: Secondary | ICD-10-CM

## 2017-12-10 DIAGNOSIS — E119 Type 2 diabetes mellitus without complications: Secondary | ICD-10-CM

## 2017-12-19 ENCOUNTER — Encounter (INDEPENDENT_AMBULATORY_CARE_PROVIDER_SITE_OTHER): Payer: Self-pay

## 2017-12-19 ENCOUNTER — Encounter: Payer: Self-pay | Admitting: Physician Assistant

## 2017-12-19 ENCOUNTER — Ambulatory Visit (INDEPENDENT_AMBULATORY_CARE_PROVIDER_SITE_OTHER): Payer: 59 | Admitting: Physician Assistant

## 2017-12-19 ENCOUNTER — Encounter: Payer: Self-pay | Admitting: *Deleted

## 2017-12-19 VITALS — BP 102/72 | HR 75 | Ht 73.0 in | Wt 264.1 lb

## 2017-12-19 DIAGNOSIS — Z720 Tobacco use: Secondary | ICD-10-CM

## 2017-12-19 DIAGNOSIS — I25119 Atherosclerotic heart disease of native coronary artery with unspecified angina pectoris: Secondary | ICD-10-CM

## 2017-12-19 DIAGNOSIS — I10 Essential (primary) hypertension: Secondary | ICD-10-CM

## 2017-12-19 DIAGNOSIS — I2510 Atherosclerotic heart disease of native coronary artery without angina pectoris: Secondary | ICD-10-CM

## 2017-12-19 NOTE — Patient Instructions (Signed)
Medication Instructions:  1. Your physician recommends that you continue on your current medications as directed. Please refer to the Current Medication list given to you today.   Labwork: NONE ORDERED TODAY  Testing/Procedures: 1. Your physician has requested that you have a lexiscan myoview. For further information please visit [email or web address]. Please follow instruction sheet, as given.    Follow-Up: [facility], PAC 3 MONTHS   Any Other Special Instructions Will Be Listed Below (If Applicable).     If you need a refill on your cardiac medications before your next appointment, please call your pharmacy.
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
## 2017-12-21 ENCOUNTER — Telehealth (HOSPITAL_COMMUNITY): Payer: Self-pay | Admitting: *Deleted

## 2017-12-21 NOTE — Telephone Encounter (Signed)
Left message on voicemail per DPR in reference to upcoming appointment scheduled on 12/28/17 with detailed instructions given per Myocardial Perfusion Study Information Sheet for the test. LM to arrive 15 minutes early, and that it is imperative to arrive on time for appointment to keep from having the test rescheduled. If you need to cancel or reschedule your appointment, please call the office within 24 hours of your appointment. Failure to do so may result in a cancellation of your appointment, and a $50 no show fee. Phone number given for call back for any questions. Kirstie Peri

## 2017-12-27 ENCOUNTER — Encounter (INDEPENDENT_AMBULATORY_CARE_PROVIDER_SITE_OTHER): Payer: Self-pay

## 2017-12-27 ENCOUNTER — Encounter: Payer: Self-pay | Admitting: Physician Assistant

## 2017-12-27 ENCOUNTER — Ambulatory Visit (HOSPITAL_COMMUNITY): Payer: 59 | Attending: Internal Medicine

## 2017-12-27 DIAGNOSIS — R079 Chest pain, unspecified: Secondary | ICD-10-CM | POA: Insufficient documentation

## 2017-12-27 DIAGNOSIS — R9439 Abnormal result of other cardiovascular function study: Secondary | ICD-10-CM | POA: Insufficient documentation

## 2017-12-27 DIAGNOSIS — I10 Essential (primary) hypertension: Secondary | ICD-10-CM | POA: Diagnosis not present

## 2017-12-27 DIAGNOSIS — I2510 Atherosclerotic heart disease of native coronary artery without angina pectoris: Secondary | ICD-10-CM | POA: Insufficient documentation

## 2017-12-27 DIAGNOSIS — E119 Type 2 diabetes mellitus without complications: Secondary | ICD-10-CM | POA: Diagnosis not present

## 2017-12-27 DIAGNOSIS — I25119 Atherosclerotic heart disease of native coronary artery with unspecified angina pectoris: Secondary | ICD-10-CM

## 2017-12-27 LAB — MYOCARDIAL PERFUSION IMAGING
CHL CUP NUCLEAR SSS: 3
LV sys vol: 85 mL
LVDIAVOL: 187 mL (ref 62–150)
NUC STRESS TID: 1.23
Peak HR: 67 {beats}/min
RATE: 0.39
Rest HR: 56 {beats}/min
SDS: 0
SRS: 3

## 2017-12-27 MED ORDER — TECHNETIUM TC 99M TETROFOSMIN IV KIT
28.7000 | PACK | Freq: Once | INTRAVENOUS | Status: AC | PRN
  Administered 2017-12-27: 28.7 via INTRAVENOUS
  Filled 2017-12-27: qty 29

## 2017-12-27 MED ORDER — REGADENOSON 0.4 MG/5ML IV SOLN
0.4000 mg | Freq: Once | INTRAVENOUS | Status: AC
Start: 2017-12-27 — End: 2017-12-27
  Administered 2017-12-27: 0.4 mg via INTRAVENOUS

## 2017-12-27 MED ORDER — TECHNETIUM TC 99M TETROFOSMIN IV KIT
9.1000 | PACK | Freq: Once | INTRAVENOUS | Status: AC | PRN
  Administered 2017-12-27: 9.1 via INTRAVENOUS
  Filled 2017-12-27: qty 10

## 2018-01-02 ENCOUNTER — Telehealth: Payer: Self-pay | Admitting: *Deleted

## 2018-01-02 NOTE — Telephone Encounter (Signed)
Pt cancelled PV appointment via reminder call. Called pt, no answer, left message for patient to call us back before 5 pm today to reschedule the PV in order to keep colon as scheduled. If he does not call us back today colon will be cancelled.

## 2018-01-03 ENCOUNTER — Ambulatory Visit (AMBULATORY_SURGERY_CENTER): Payer: Self-pay | Admitting: *Deleted

## 2018-01-03 ENCOUNTER — Other Ambulatory Visit: Payer: Self-pay

## 2018-01-03 VITALS — Ht 73.0 in | Wt 265.6 lb

## 2018-01-03 DIAGNOSIS — Z1211 Encounter for screening for malignant neoplasm of colon: Secondary | ICD-10-CM

## 2018-01-03 NOTE — Progress Notes (Signed)
Patient denies any allergies to egg or soy products. Patient denies complications with anesthesia/sedation.  Patient denies oxygen use at home and denies diet medications. Patient uses CPAP nightly.  Pamphlet given on colonoscopy.[name]

## 2018-01-04 ENCOUNTER — Ambulatory Visit: Payer: Medicare Other | Admitting: Physician Assistant

## 2018-01-05 ENCOUNTER — Encounter: Payer: Self-pay | Admitting: Physician Assistant

## 2018-01-05 ENCOUNTER — Ambulatory Visit (INDEPENDENT_AMBULATORY_CARE_PROVIDER_SITE_OTHER): Payer: 59 | Admitting: Physician Assistant

## 2018-01-05 ENCOUNTER — Other Ambulatory Visit: Payer: Self-pay

## 2018-01-05 VITALS — BP 130/72 | HR 72 | Temp 97.7°F | Resp 18 | Ht 73.0 in | Wt 262.6 lb

## 2018-01-05 DIAGNOSIS — E119 Type 2 diabetes mellitus without complications: Secondary | ICD-10-CM

## 2018-01-05 DIAGNOSIS — I2510 Atherosclerotic heart disease of native coronary artery without angina pectoris: Secondary | ICD-10-CM

## 2018-01-05 DIAGNOSIS — X32XXXA Exposure to sunlight, initial encounter: Secondary | ICD-10-CM

## 2018-01-05 DIAGNOSIS — I10 Essential (primary) hypertension: Secondary | ICD-10-CM

## 2018-01-05 DIAGNOSIS — E785 Hyperlipidemia, unspecified: Secondary | ICD-10-CM | POA: Diagnosis not present

## 2018-01-05 MED ORDER — GLIPIZIDE 5 MG PO TABS: 2.5000 mg | ORAL_TABLET | Freq: Every day | ORAL | 0 refills | Status: DC

## 2018-01-05 MED ORDER — METFORMIN HCL ER 500 MG PO TB24: 1000.0000 mg | ORAL_TABLET | Freq: Two times a day (BID) | ORAL | 0 refills | Status: DC

## 2018-01-05 MED ORDER — DAPAGLIFLOZIN PROPANEDIOL 5 MG PO TABS: 5.0000 mg | ORAL_TABLET | Freq: Every day | ORAL | 0 refills | Status: DC

## 2018-01-05 MED ORDER — ATORVASTATIN CALCIUM 40 MG PO TABS
ORAL_TABLET | ORAL | 0 refills | Status: DC
Start: 2018-01-05 — End: 2018-03-08

## 2018-01-05 MED ORDER — AMLODIPINE BESYLATE 10 MG PO TABS: ORAL_TABLET | ORAL | 0 refills | Status: DC

## 2018-01-05 MED ORDER — LEVOTHYROXINE SODIUM 125 MCG PO TABS: 125.0000 ug | ORAL_TABLET | Freq: Every day | ORAL | 0 refills | Status: DC
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
## 2018-01-05 NOTE — Patient Instructions (Addendum)
I have sent in refills for all of your medications. Please check fasting blood sugar at least a few times weekly. Goal when fasting is 80-100. I have asked the pharmacy to cut your glipizide tablet in half for you. Please contact our office if your fasting blood sugar is ever below the recommend range. Follow up in 2 months for lab work. Have a great summer!   Diabetes Mellitus and Nutrition When you have diabetes (diabetes mellitus), it is very important to have healthy eating habits because your blood sugar (glucose) levels are greatly affected by what you eat and drink. Eating healthy foods in the appropriate amounts, at about the same times every day, can help you:  Control your blood glucose.  Lower your risk of heart disease.  Improve your blood pressure.  Reach or maintain a healthy weight.  Every person with diabetes is different, and each person has different needs for a meal plan. Your health care provider may recommend that you work with a diet and nutrition specialist (dietitian) to make a meal plan that is best for you. Your meal plan may vary depending on factors such as:  The calories you need.  The medicines you take.  Your weight.  Your blood glucose, blood pressure, and cholesterol levels.  Your activity level.  Other health conditions you have, such as heart or kidney disease.  How do carbohydrates affect me? Carbohydrates affect your blood glucose level more than any other type of food. Eating carbohydrates naturally increases the amount of glucose in your blood. Carbohydrate counting is a method for keeping track of how many carbohydrates you eat. Counting carbohydrates is important to keep your blood glucose at a healthy level, especially if you use insulin or take certain oral diabetes medicines. It is important to know how many carbohydrates you can safely have in each meal. This is different for every person. Your dietitian can help you calculate how many  carbohydrates you should have at each meal and for snack. Foods that contain carbohydrates include:  Bread, cereal, rice, pasta, and crackers.  Potatoes and corn.  Peas, beans, and lentils.  Milk and yogurt.  Fruit and juice.  Desserts, such as cakes, cookies, ice cream, and candy.  How does alcohol affect me? Alcohol can cause a sudden decrease in blood glucose (hypoglycemia), especially if you use insulin or take certain oral diabetes medicines. Hypoglycemia can be a life-threatening condition. Symptoms of hypoglycemia (sleepiness, dizziness, and confusion) are similar to symptoms of having too much alcohol. If your health care provider says that alcohol is safe for you, follow these guidelines:  Limit alcohol intake to no more than 1 drink per day for nonpregnant women and 2 drinks per day for men. One drink equals 12 oz of beer, 5 oz of wine, or 1 oz of hard liquor.  Do not drink on an empty stomach.  Keep yourself hydrated with water, diet soda, or unsweetened iced tea.  Keep in mind that regular soda, juice, and other mixers may contain a lot of sugar and must be counted as carbohydrates.  What are tips for following this plan? Reading food labels  Start by checking the serving size on the label. The amount of calories, carbohydrates, fats, and other nutrients listed on the label are based on one serving of the food. Many foods contain more than one serving per package.  Check the total grams (g) of carbohydrates in one serving. You can calculate the number of servings of carbohydrates in  one serving by dividing the total carbohydrates by 15. For example, if a food has 30 g of total carbohydrates, it would be equal to 2 servings of carbohydrates.  Check the number of grams (g) of saturated and trans fats in one serving. Choose foods that have low or no amount of these fats.  Check the number of milligrams (mg) of sodium in one serving. Most people should limit total sodium  intake to less than 2,300 mg per day.  Always check the nutrition information of foods labeled as "low-fat" or "nonfat". These foods may be higher in added sugar or refined carbohydrates and should be avoided.  Talk to your dietitian to identify your daily goals for nutrients listed on the label. Shopping  Avoid buying canned, premade, or processed foods. These foods tend to be high in fat, sodium, and added sugar.  Shop around the outside edge of the grocery store. This includes fresh fruits and vegetables, bulk grains, fresh meats, and fresh dairy. Cooking  Use low-heat cooking methods, such as baking, instead of high-heat cooking methods like deep frying.  Cook using healthy oils, such as olive, canola, or sunflower oil.  Avoid cooking with butter, cream, or high-fat meats. Meal planning  Eat meals and snacks regularly, preferably at the same times every day. Avoid going long periods of time without eating.  Eat foods high in fiber, such as fresh fruits, vegetables, beans, and whole grains. Talk to your dietitian about how many servings of carbohydrates you can eat at each meal.  Eat 4-6 ounces of lean protein each day, such as lean meat, chicken, fish, eggs, or tofu. 1 ounce is equal to 1 ounce of meat, chicken, or fish, 1 egg, or 1/4 cup of tofu.  Eat some foods each day that contain healthy fats, such as avocado, nuts, seeds, and fish. Lifestyle   Check your blood glucose regularly.  Exercise at least 30 minutes 5 or more days each week, or as told by your health care provider.  Take medicines as told by your health care provider.  Do not use any products that contain nicotine or tobacco, such as cigarettes and e-cigarettes. If you need help quitting, ask your health care provider.  Work with a Social worker or diabetes educator to identify strategies to manage stress and any emotional and social challenges. What are some questions to ask my health care provider?  Do I need  to meet with a diabetes educator?  Do I need to meet with a dietitian?  What number can I call if I have questions?  When are the best times to check my blood glucose? Where to find more information:  American Diabetes Association: diabetes.org/food-and-fitness/food  Academy of Nutrition and Dietetics: PokerClues.dk  [employer] of Diabetes and Digestive and Kidney Diseases (NIH): ContactWire.be Summary  A healthy meal plan will help you control your blood glucose and maintain a healthy lifestyle.  Working with a diet and nutrition specialist (dietitian) can help you make a meal plan that is best for you.  Keep in mind that carbohydrates and alcohol have immediate effects on your blood glucose levels. It is important to count carbohydrates and to use alcohol carefully. This information is not intended to replace advice given to you by your health care provider. Make sure you discuss any questions you have with your health care provider. Document Released: 03/11/2005 Document Revised: 07/19/2016 Document Reviewed: 07/19/2016 Elsevier Interactive Patient Education  2018 [employer].    Steps to Quit Smoking Smoking  tobacco can be bad for your health. It can also affect almost every organ in your body. Smoking puts you and people around you at risk for many serious long-lasting (chronic) diseases. Quitting smoking is hard, but it is one of the best things that you can do for your health. It is never too late to quit. What are the benefits of quitting smoking? When you quit smoking, you lower your risk for getting serious diseases and conditions. They can include:  Lung cancer or lung disease.  Heart disease.  Stroke.  Heart attack.  Not being able to have children (infertility).  Weak bones (osteoporosis) and broken bones (fractures).  If you  have coughing, wheezing, and shortness of breath, those symptoms may get better when you quit. You may also get sick less often. If you are pregnant, quitting smoking can help to lower your chances of having a baby of low birth weight. What can I do to help me quit smoking? Talk with your doctor about what can help you quit smoking. Some things you can do (strategies) include:  Quitting smoking totally, instead of slowly cutting back how much you smoke over a period of time.  Going to in-person counseling. You are more likely to quit if you go to many counseling sessions.  Using resources and support systems, such as: ? Database administrator with a Social worker. ? Phone quitlines. ? Careers information officer. ? Support groups or group counseling. ? Text messaging programs. ? Mobile phone apps or applications.  Taking medicines. Some of these medicines may have nicotine in them. If you are pregnant or breastfeeding, do not take any medicines to quit smoking unless your doctor says it is okay. Talk with your doctor about counseling or other things that can help you.  Talk with your doctor about using more than one strategy at the same time, such as taking medicines while you are also going to in-person counseling. This can help make quitting easier. What things can I do to make it easier to quit? Quitting smoking might feel very hard at first, but there is a lot that you can do to make it easier. Take these steps:  Talk to your family and friends. Ask them to support and encourage you.  Call phone quitlines, reach out to support groups, or work with a Social worker.  Ask people who smoke to not smoke around you.  Avoid places that make you want (trigger) to smoke, such as: ? Bars. ? Parties. ? Smoke-break areas at work.  Spend time with people who do not smoke.  Lower the stress in your life. Stress can make you want to smoke. Try these things to help your stress: ? Getting regular  exercise. ? Deep-breathing exercises. ? Yoga. ? Meditating. ? Doing a body scan. To do this, close your eyes, focus on one area of your body at a time from head to toe, and notice which parts of your body are tense. Try to relax the muscles in those areas.  Download or buy apps on your mobile phone or tablet that can help you stick to your quit plan. There are many free apps, such as QuitGuide from the [employer] Office manager for Disease Control and Prevention). You can find more support from smokefree.gov and other websites.  This information is not intended to replace advice given to you by your health care provider. Make sure you discuss any questions you have with your health care provider. Document Released: 04/10/2009 Document Revised: 02/10/2016 Document Reviewed:  10/29/2014 Elsevier Interactive Patient Education  2018 [employer].   IF you received an x-ray today, you will receive an invoice from [facility]. Please contact [facility] at [phone] with questions or concerns regarding your invoice.   IF you received labwork today, you will receive an invoice from [facility]. Please contact [facility] at [phone] with questions or concerns regarding your invoice.   Our billing staff will not be able to assist you with questions regarding bills from these companies.  You will be contacted with the lab results as soon as they are available. The fastest way to get your results is to activate your My Chart account. Instructions are located on the last page of this paperwork. If you have not heard from [place] regarding the results in 2 weeks, please contact this office.

## 2018-01-06 ENCOUNTER — Encounter: Payer: Self-pay | Admitting: Internal Medicine

## 2018-01-07 ENCOUNTER — Encounter: Payer: Self-pay | Admitting: Physician Assistant

## 2018-01-08 ENCOUNTER — Other Ambulatory Visit: Payer: Self-pay | Admitting: Physician Assistant

## 2018-01-08 DIAGNOSIS — K219 Gastro-esophageal reflux disease without esophagitis: Secondary | ICD-10-CM

## 2018-01-08 DIAGNOSIS — Z8719 Personal history of other diseases of the digestive system: Secondary | ICD-10-CM

## 2018-01-16 ENCOUNTER — Ambulatory Visit: Payer: Medicare Other | Admitting: Internal Medicine

## 2018-02-01 ENCOUNTER — Telehealth: Payer: Self-pay | Admitting: Physician Assistant

## 2018-02-01 NOTE — Telephone Encounter (Signed)
Copied from [place] [id number]. Topic: Quick Communication - See Telephone Encounter >> Feb 01, 2018 10:42 AM Mylinda Latina, NT wrote: CRM for notification. See Telephone encounter for: 02/01/18. Olivia calling from [place] is needing clarification the medication glipiZIDE (GLUCOTROL) 5 MG tablet . She needs clarification  on the comment that was left for the pharmacy to cut the tablet in 1/2 they can not do that. Please call CB# [phone] reference #[id number]

## 2018-02-05 ENCOUNTER — Other Ambulatory Visit: Payer: Self-pay | Admitting: Physician Assistant

## 2018-02-05 DIAGNOSIS — E119 Type 2 diabetes mellitus without complications: Secondary | ICD-10-CM

## 2018-02-06 NOTE — Telephone Encounter (Signed)
Patient called, left VM on both phones to return call to the office to discuss farxiga refill request. See below message for details.

## 2018-02-06 NOTE — Telephone Encounter (Signed)
Patient called, left VM on both home and cell phones to call the office back to discuss the refill request [place]. This medication was sent to [facility] on 01/05/18 90 tab/0 refill. Will need to verify if he received and if he is wanting to use [facility].

## 2018-02-08 ENCOUNTER — Other Ambulatory Visit: Payer: Self-pay

## 2018-02-08 DIAGNOSIS — Z8719 Personal history of other diseases of the digestive system: Secondary | ICD-10-CM

## 2018-02-08 DIAGNOSIS — K219 Gastro-esophageal reflux disease without esophagitis: Secondary | ICD-10-CM

## 2018-02-08 MED ORDER — ESOMEPRAZOLE MAGNESIUM 40 MG PO CPDR: DELAYED_RELEASE_CAPSULE | ORAL | 0 refills | Status: DC

## 2018-02-08 NOTE — Telephone Encounter (Signed)
I talked with pt. Pt states that he do not need a refill on the Farxiga but would like a 30 day supply of the Nexium due to [facility] not sending this medication out to him yet. [facility] am [facility] [address] [employer]

## 2018-02-08 NOTE — Telephone Encounter (Signed)
[place] ,  The Glipizide can't be cut in half.  I called the pharmacy they do have a 2.5mg  but it is XL only.  If that is ok they said just send in a new prescription for that.

## 2018-02-09 ENCOUNTER — Other Ambulatory Visit: Payer: Self-pay | Admitting: Physician Assistant

## 2018-02-09 DIAGNOSIS — I10 Essential (primary) hypertension: Secondary | ICD-10-CM

## 2018-02-09 DIAGNOSIS — E119 Type 2 diabetes mellitus without complications: Secondary | ICD-10-CM

## 2018-02-09 DIAGNOSIS — E785 Hyperlipidemia, unspecified: Secondary | ICD-10-CM

## 2018-02-09 MED ORDER — GLIPIZIDE ER 2.5 MG PO TB24: 2.5000 mg | ORAL_TABLET | Freq: Every day | ORAL | 0 refills | Status: DC

## 2018-02-09 NOTE — Telephone Encounter (Signed)
Left message on voicemail per ROI that glipizide 2.[facility] mg xl [facility] [address] but Timmothy Euler advises she knows you sometimes want scripts sent to [place] so if it needs to go to [facility] to please give [place] a call back and when can resend to [facility]. DGaddy, CMA

## 2018-02-09 NOTE — Telephone Encounter (Signed)
[facility] called and spoke to [place], [place] about the refill request for Amlodipine, Glipizide and Atorvastatin. I asked did they receive the prescriptions on 01/05/18 90 day supply/0 refills for all three medications. She says they did receive Amlodipine and Atorvastatin and it was sent to the patient on 02/01/18. She says they had no refills, so they just requested a refill to have on file when it is time to refill the next time. She says the Glipizide was never received and they submitted a request for the 5 mg take 1 tab daily. I advised the new prescription sent on 01/05/18 indicated to take 2.5 mg (1/2 5 mg tab) once daily and that today the prescription was sent to the local pharmacy. Patient was called and left VM on both phones that Glipizide is at [facility] on [address] and if he needs it sent to [facility] to return the call to the office.

## 2018-02-09 NOTE — Telephone Encounter (Signed)
I sent in a new prescription for glipizide 2.5 mg XL tablets.  [facility] [address].  Please call patient to make sure this is the correct pharmacy because sometimes he likes it sent to [name].  If he would like it resent to [place], could you please do that for me?  Thanks.

## 2018-02-17 ENCOUNTER — Telehealth: Payer: Self-pay | Admitting: Physician Assistant

## 2018-02-17 NOTE — Telephone Encounter (Signed)
Copied from [place] [id number]. Topic: Quick Communication - See Telephone Encounter >> Feb 17, 2018  8:29 AM Gardiner Ramus wrote: CRM for notification. See Telephone encounter for: 02/17/18. Pt wife called and stated that pt has a CPAP machine and the pt needs new hose and mask. Pt wife does not know where she could get these supplies. Please advise

## 2018-02-22 NOTE — Telephone Encounter (Signed)
Pt sates understand. I talked with pt. Pt states that he will check with advance and let us know.

## 2018-02-22 NOTE — Telephone Encounter (Signed)
Please call pt. For new supplies, he can do 1 of 2 things.    1) he can call [facility] at  [phone] and ask what Rx I would need to write for him to get the supplies he needs and then contact us back and let us know.   2) he can contact the neurologist who gave him the initial Rx for these supplies and they can provide refills.

## 2018-03-08 ENCOUNTER — Ambulatory Visit (INDEPENDENT_AMBULATORY_CARE_PROVIDER_SITE_OTHER): Payer: 59 | Admitting: Physician Assistant

## 2018-03-08 ENCOUNTER — Encounter: Payer: Self-pay | Admitting: Physician Assistant

## 2018-03-08 ENCOUNTER — Other Ambulatory Visit: Payer: Self-pay

## 2018-03-08 VITALS — BP 126/83 | HR 74 | Temp 98.4°F | Resp 20 | Ht 73.62 in | Wt 265.2 lb

## 2018-03-08 DIAGNOSIS — Z8719 Personal history of other diseases of the digestive system: Secondary | ICD-10-CM

## 2018-03-08 DIAGNOSIS — Z23 Encounter for immunization: Secondary | ICD-10-CM

## 2018-03-08 DIAGNOSIS — E119 Type 2 diabetes mellitus without complications: Secondary | ICD-10-CM

## 2018-03-08 DIAGNOSIS — E039 Hypothyroidism, unspecified: Secondary | ICD-10-CM | POA: Diagnosis not present

## 2018-03-08 DIAGNOSIS — I2510 Atherosclerotic heart disease of native coronary artery without angina pectoris: Secondary | ICD-10-CM

## 2018-03-08 DIAGNOSIS — I10 Essential (primary) hypertension: Secondary | ICD-10-CM | POA: Diagnosis not present

## 2018-03-08 DIAGNOSIS — Z720 Tobacco use: Secondary | ICD-10-CM | POA: Diagnosis not present

## 2018-03-08 DIAGNOSIS — E785 Hyperlipidemia, unspecified: Secondary | ICD-10-CM

## 2018-03-08 DIAGNOSIS — K219 Gastro-esophageal reflux disease without esophagitis: Secondary | ICD-10-CM

## 2018-03-08 LAB — CMP14+EGFR
ALBUMIN: 4.6 g/dL (ref 3.5–5.5)
ALK PHOS: 53 IU/L (ref 39–117)
ALT: 38 IU/L (ref 0–44)
AST: 28 IU/L (ref 0–40)
Albumin/Globulin Ratio: 2 (ref 1.2–2.2)
BILIRUBIN TOTAL: 0.4 mg/dL (ref 0.0–1.2)
BUN/Creatinine Ratio: 19 (ref 9–20)
BUN: 18 mg/dL (ref 6–24)
CHLORIDE: 98 mmol/L (ref 96–106)
CO2: 24 mmol/L (ref 20–29)
Calcium: 9.6 mg/dL (ref 8.7–10.2)
Creatinine, Ser: 0.93 mg/dL (ref 0.76–1.27)
GFR calc Af Amer: 107 mL/min/{1.73_m2} (ref >59)
GFR calc non Af Amer: 93 mL/min/{1.73_m2} (ref >59)
GLUCOSE: 116 mg/dL — AB (ref 65–99)
Globulin, Total: 2.3 g/dL (ref 1.5–4.5)
Potassium: 4 mmol/L (ref 3.5–5.2)
Sodium: 142 mmol/L (ref 134–144)
Total Protein: 6.9 g/dL (ref 6.0–8.5)

## 2018-03-08 LAB — CBC WITH DIFFERENTIAL/PLATELET
BASOS ABS: 0.2 10*3/uL (ref 0.0–0.2)
BASOS: 2 %
EOS (ABSOLUTE): 0.6 10*3/uL — AB (ref 0.0–0.4)
Eos: 7 %
Hematocrit: 46.4 % (ref 37.5–51.0)
Hemoglobin: 15.8 g/dL (ref 13.0–17.7)
Immature Grans (Abs): 0 10*3/uL (ref 0.0–0.1)
Immature Granulocytes: 0 %
Lymphocytes Absolute: 1.7 10*3/uL (ref 0.7–3.1)
Lymphs: 19 %
MCH: 30 pg (ref 26.6–33.0)
MCHC: 34.1 g/dL (ref 31.5–35.7)
MCV: 88 fL (ref 79–97)
MONOCYTES: 8 %
Monocytes Absolute: 0.7 10*3/uL (ref 0.1–0.9)
Neutrophils Absolute: 5.8 10*3/uL (ref 1.4–7.0)
Neutrophils: 64 %
Platelets: 284 10*3/uL (ref 150–450)
RBC: 5.26 x10E6/uL (ref 4.14–5.80)
RDW: 15.5 % — ABNORMAL HIGH (ref 12.3–15.4)
WBC: 9 10*3/uL (ref 3.4–10.8)

## 2018-03-08 LAB — POCT GLYCOSYLATED HEMOGLOBIN (HGB A1C): HEMOGLOBIN A1C: 6 % — AB (ref 4.0–5.6)

## 2018-03-08 MED ORDER — AMLODIPINE BESYLATE 10 MG PO TABS: ORAL_TABLET | ORAL | 0 refills | Status: DC

## 2018-03-08 MED ORDER — CARVEDILOL 6.25 MG PO TABS: 6.2500 mg | ORAL_TABLET | Freq: Two times a day (BID) | ORAL | 0 refills | Status: DC

## 2018-03-08 MED ORDER — ATORVASTATIN CALCIUM 40 MG PO TABS: ORAL_TABLET | ORAL | 0 refills | Status: DC

## 2018-03-08 MED ORDER — LEVOTHYROXINE SODIUM 125 MCG PO TABS: 125.0000 ug | ORAL_TABLET | Freq: Every day | ORAL | 0 refills | Status: DC

## 2018-03-08 MED ORDER — METFORMIN HCL ER 500 MG PO TB24: 1000.0000 mg | ORAL_TABLET | Freq: Two times a day (BID) | ORAL | 0 refills | Status: DC

## 2018-03-08 MED ORDER — DAPAGLIFLOZIN PROPANEDIOL 5 MG PO TABS: 5.0000 mg | ORAL_TABLET | Freq: Every day | ORAL | 0 refills | Status: DC

## 2018-03-08 MED ORDER — RAMIPRIL 10 MG PO CAPS: 10.0000 mg | ORAL_CAPSULE | Freq: Every day | ORAL | 0 refills | Status: DC

## 2018-03-08 MED ORDER — ESOMEPRAZOLE MAGNESIUM 40 MG PO CPDR: DELAYED_RELEASE_CAPSULE | ORAL | 0 refills | Status: DC

## 2018-03-08 NOTE — Patient Instructions (Addendum)
Your A1c has improved since last visit.  I would like you to discontinue glipizide completely.  Continue with metformin and Farxiga as prescribed for diabetes.  Follow-up in 3 months for reevaluation.  If you have lab work done today you will be contacted with your lab results within the next 2 weeks.  If you have not heard from [place] then please contact us. The fastest way to get your results is to register for My Chart.  Steps to Quit Smoking Smoking tobacco can be bad for your health. It can also affect almost every organ in your body. Smoking puts you and people around you at risk for many serious long-lasting (chronic) diseases. Quitting smoking is hard, but it is one of the best things that you can do for your health. It is never too late to quit. What are the benefits of quitting smoking? When you quit smoking, you lower your risk for getting serious diseases and conditions. They can include:  Lung cancer or lung disease.  Heart disease.  Stroke.  Heart attack.  Not being able to have children (infertility).  Weak bones (osteoporosis) and broken bones (fractures).  If you have coughing, wheezing, and shortness of breath, those symptoms may get better when you quit. You may also get sick less often. If you are pregnant, quitting smoking can help to lower your chances of having a baby of low birth weight. What can I do to help me quit smoking? Talk with your doctor about what can help you quit smoking. Some things you can do (strategies) include:  Quitting smoking totally, instead of slowly cutting back how much you smoke over a period of time.  Going to in-person counseling. You are more likely to quit if you go to many counseling sessions.  Using resources and support systems, such as: ? Database administrator with a Social worker. ? Phone quitlines. ? Careers information officer. ? Support groups or group counseling. ? Text messaging programs. ? Mobile phone apps or  applications.  Taking medicines. Some of these medicines may have nicotine in them. If you are pregnant or breastfeeding, do not take any medicines to quit smoking unless your doctor says it is okay. Talk with your doctor about counseling or other things that can help you.  Talk with your doctor about using more than one strategy at the same time, such as taking medicines while you are also going to in-person counseling. This can help make quitting easier. What things can I do to make it easier to quit? Quitting smoking might feel very hard at first, but there is a lot that you can do to make it easier. Take these steps:  Talk to your family and friends. Ask them to support and encourage you.  Call phone quitlines, reach out to support groups, or work with a Social worker.  Ask people who smoke to not smoke around you.  Avoid places that make you want (trigger) to smoke, such as: ? Bars. ? Parties. ? Smoke-break areas at work.  Spend time with people who do not smoke.  Lower the stress in your life. Stress can make you want to smoke. Try these things to help your stress: ? Getting regular exercise. ? Deep-breathing exercises. ? Yoga. ? Meditating. ? Doing a body scan. To do this, close your eyes, focus on one area of your body at a time from head to toe, and notice which parts of your body are tense. Try to relax the muscles in those areas.  Download or buy apps on your mobile phone or tablet that can help you stick to your quit plan. There are many free apps, such as QuitGuide from the [employer] Office manager for Disease Control and Prevention). You can find more support from smokefree.gov and other websites.  This information is not intended to replace advice given to you by your health care provider. Make sure you discuss any questions you have with your health care provider. Document Released: 04/10/2009 Document Revised: 02/10/2016 Document Reviewed: 10/29/2014 Elsevier Interactive Patient  Education  2018 [employer].   IF you received an x-ray today, you will receive an invoice from [facility]. Please contact [facility] at [phone] with questions or concerns regarding your invoice.   IF you received labwork today, you will receive an invoice from [facility]. Please contact [facility] at [phone] with questions or concerns regarding your invoice.   Our billing staff will not be able to assist you with questions regarding bills from these companies.  You will be contacted with the lab results as soon as they are available. The fastest way to get your results is to activate your My Chart account. Instructions are located on the last page of this paperwork. If you have not heard from [place] regarding the results in 2 weeks, please contact this office.
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
## 2018-03-09 LAB — URINALYSIS, DIPSTICK ONLY
Bilirubin, UA: NEGATIVE
Ketones, UA: NEGATIVE
Leukocytes, UA: NEGATIVE
NITRITE UA: NEGATIVE
PH UA: 5.5 (ref 5.0–7.5)
Protein, UA: NEGATIVE
RBC UA: NEGATIVE
Specific Gravity, UA: >=1.03 — AB (ref 1.005–1.030)
Urobilinogen, Ur: 0.2 mg/dL (ref 0.2–1.0)

## 2018-03-09 LAB — MICROALBUMIN / CREATININE URINE RATIO
CREATININE, UR: 64.4 mg/dL
Microalb/Creat Ratio: 24.2 mg/g creat (ref 0.0–30.0)
Microalbumin, Urine: 15.6 ug/mL
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
## 2018-03-13 ENCOUNTER — Ambulatory Visit: Payer: Medicare Other | Admitting: Physician Assistant

## 2018-03-25 ENCOUNTER — Other Ambulatory Visit: Payer: Self-pay | Admitting: Physician Assistant

## 2018-03-25 DIAGNOSIS — E119 Type 2 diabetes mellitus without complications: Secondary | ICD-10-CM

## 2018-04-04 ENCOUNTER — Other Ambulatory Visit: Payer: Self-pay | Admitting: Physician Assistant

## 2018-04-04 DIAGNOSIS — I10 Essential (primary) hypertension: Secondary | ICD-10-CM

## 2018-04-04 NOTE — Telephone Encounter (Signed)
Requested medication (s) are due for refill today: yes  Requested medication (s) are on the active medication list: no    Last refill: 09/12/17  #80 1 refill  Future visit scheduled yes  Notes to clinic: Not on current med profile  Requested Prescriptions  Pending Prescriptions Disp Refills   hydrochlorothiazide (HYDRODIURIL) 25 MG tablet [Pharmacy Med Name: HYDROCHLOROT TAB 25MG ] 80 tablet 1    Sig: TAKE 1 TABLET DAILY     Cardiovascular: Diuretics - Thiazide Passed - 04/04/2018  5:15 AM      Passed - Ca in normal range and within 360 days    Calcium  Date Value Ref Range Status  03/08/2018 9.6 8.7 - 10.2 mg/dL Final         Passed - Cr in normal range and within 360 days    Creat  Date Value Ref Range Status  04/01/2016 0.75 0.70 - 1.33 mg/dL Final    Comment:      For patients > or = 54 years of age: The upper reference limit for Creatinine is approximately 13% higher for people identified as African-American.      Creatinine, Ser  Date Value Ref Range Status  03/08/2018 0.93 0.76 - 1.27 mg/dL Final         Passed - K in normal range and within 360 days    Potassium  Date Value Ref Range Status  03/08/2018 4.0 3.5 - 5.2 mmol/L Final         Passed - Na in normal range and within 360 days    Sodium  Date Value Ref Range Status  03/08/2018 142 134 - 144 mmol/L Final         Passed - Last BP in normal range    BP Readings from Last 1 Encounters:  03/08/18 126/83         Passed - Valid encounter within last 6 months    Recent Outpatient Visits          3 weeks ago Type 2 diabetes mellitus without complication, without long-term current use of insulin ([place])   [facility] [place], [place] D, PA-C   2 months ago Type 2 diabetes mellitus without complication, without long-term current use of insulin ([place])   [facility] [place], [place] D, PA-C   4 months ago 2-vessel coronary artery disease   [facility] [place], [place] D,  PA-C   6 months ago Type 2 diabetes mellitus without complication, without long-term current use of insulin [facility])   [facility] [place], [place] D, PA-C   9 months ago Hypothyroidism, unspecified type   [facility] at [facility], [place] D, PA-C      Future Appointments            In 2 months Timmothy Euler, [place] D, PA-C [facility] [place], [facility]   In 4 months Mcarthur Rossetti, MD [facility]         Signed Prescriptions Disp Refills   ramipril (ALTACE) 10 MG capsule 90 capsule 1    Sig: TAKE 1 CAPSULE DAILY     Cardiovascular:  ACE Inhibitors Passed - 04/04/2018  5:15 AM      Passed - Cr in normal range and within 180 days    Creat  Date Value Ref Range Status  04/01/2016 0.75 0.70 - 1.33 mg/dL Final    Comment:      For patients > or = 54 years of age:  The upper reference limit for Creatinine is approximately 13% higher for people identified as African-American.      Creatinine, Ser  Date Value Ref Range Status  03/08/2018 0.93 0.76 - 1.27 mg/dL Final         Passed - K in normal range and within 180 days    Potassium  Date Value Ref Range Status  03/08/2018 4.0 3.5 - 5.2 mmol/L Final         Passed - Patient is not pregnant      Passed - Last BP in normal range    BP Readings from Last 1 Encounters:  03/08/18 126/83         Passed - Valid encounter within last 6 months    Recent Outpatient Visits          3 weeks ago Type 2 diabetes mellitus without complication, without long-term current use of insulin ([place])   [facility] [place], [place] D, PA-C   2 months ago Type 2 diabetes mellitus without complication, without long-term current use of insulin ([place])   [facility] at [place], [place] D, PA-C   4 months ago 2-vessel coronary artery disease   [facility] at [place], [place] D, PA-C   6 months ago Type 2 diabetes mellitus without complication, without long-term current use of  insulin [facility])   [facility] [place], [place] D, PA-C   9 months ago Hypothyroidism, unspecified type   Primary Care at [facility], Reather Laurence, PA-C      Future Appointments            In 2 months Timmothy Euler, Reather Laurence, PA-C [facility] [place], [facility]   In 4 months Ninfa Linden, Lind Guest, MD [facility]

## 2018-04-05 ENCOUNTER — Other Ambulatory Visit: Payer: Self-pay | Admitting: Physician Assistant

## 2018-04-05 DIAGNOSIS — E119 Type 2 diabetes mellitus without complications: Secondary | ICD-10-CM

## 2018-04-05 MED ORDER — METFORMIN HCL ER 500 MG PO TB24: 1000.0000 mg | ORAL_TABLET | Freq: Two times a day (BID) | ORAL | 0 refills | Status: DC

## 2018-04-05 NOTE — Telephone Encounter (Signed)
Copied from [facility] [phone]. Topic: Quick Communication - Rx Refill/Question >> Apr 05, 2018  4:23 PM Leward Quan A wrote: Medication: metFORMIN (GLUCOPHAGE-XR) 500 MG 24 hr tablet   Has the patient contacted their pharmacy? Yes.     Preferred Pharmacy (with phone number or street name): [facility], [place] to [facility] [employer] [facility] [phone] (Phone) [phone] (Fax)    Agent: Please be advised that RX refills may take up to 3 business days. We ask that you follow-up with your pharmacy.

## 2018-04-22 ENCOUNTER — Other Ambulatory Visit: Payer: Self-pay | Admitting: Physician Assistant

## 2018-04-22 DIAGNOSIS — I10 Essential (primary) hypertension: Secondary | ICD-10-CM

## 2018-04-24 NOTE — Telephone Encounter (Signed)
Requested medication (s) are due for refill today: yes  Requested medication (s) are on the active medication list: no  Last refill:  01/25/18  Future visit scheduled: yes  Notes to clinic:  D/C'd 11/22/17    Requested Prescriptions  Pending Prescriptions Disp Refills   hydrochlorothiazide (HYDRODIURIL) 25 MG tablet [Pharmacy Med Name: HYDROCHLOROT TAB 25MG ] 80 tablet 1    Sig: TAKE 1 TABLET DAILY     Cardiovascular: Diuretics - Thiazide Passed - 04/22/2018  3:34 AM      Passed - Ca in normal range and within 360 days    Calcium  Date Value Ref Range Status  03/08/2018 9.6 8.7 - 10.2 mg/dL Final         Passed - Cr in normal range and within 360 days    Creat  Date Value Ref Range Status  04/01/2016 0.75 0.70 - 1.33 mg/dL Final    Comment:      For patients > or = 54 years of age: The upper reference limit for Creatinine is approximately 13% higher for people identified as African-American.      Creatinine, Ser  Date Value Ref Range Status  03/08/2018 0.93 0.76 - 1.27 mg/dL Final         Passed - K in normal range and within 360 days    Potassium  Date Value Ref Range Status  03/08/2018 4.0 3.5 - 5.2 mmol/L Final         Passed - Na in normal range and within 360 days    Sodium  Date Value Ref Range Status  03/08/2018 142 134 - 144 mmol/L Final         Passed - Last BP in normal range    BP Readings from Last 1 Encounters:  03/08/18 126/83         Passed - Valid encounter within last 6 months    Recent Outpatient Visits          1 month ago Type 2 diabetes mellitus without complication, without long-term current use of insulin ([place])   [facility] [place], [place] D, PA-C   3 months ago Type 2 diabetes mellitus without complication, without long-term current use of insulin ([place])   [facility] at [place], [place] D, PA-C   5 months ago 2-vessel coronary artery disease   [facility] [place], [place] D, PA-C   7 months ago  Type 2 diabetes mellitus without complication, without long-term current use of insulin [facility])   [facility] at [place], [place] D, PA-C   10 months ago Hypothyroidism, unspecified type   [facility] at [facility], Reather Laurence, PA-C      Future Appointments            In 1 month Timmothy Euler, Reather Laurence, PA-C [facility] at [place], [facility]   In 3 months Ninfa Linden, Lind Guest, MD [facility]

## 2018-04-26 ENCOUNTER — Other Ambulatory Visit: Payer: Self-pay | Admitting: Physician Assistant

## 2018-04-26 DIAGNOSIS — I10 Essential (primary) hypertension: Secondary | ICD-10-CM

## 2018-04-26 NOTE — Telephone Encounter (Signed)
Left message for pt to return call to the office to confirm which pharmacy the pt would like to use for refills. Hydrochlorothiazide was previously sent to [facility] on 10/28 and [facility] [address] [facility].

## 2018-04-27 ENCOUNTER — Telehealth: Payer: Self-pay | Admitting: *Deleted

## 2018-04-27 MED ORDER — HYDROCHLOROTHIAZIDE 25 MG PO TABS: 25.0000 mg | ORAL_TABLET | Freq: Every day | ORAL | 1 refills | Status: DC

## 2018-04-27 NOTE — Telephone Encounter (Signed)
Spoke with patient regarding rx for HCTZ 25 mg #80 with 1 refill. He wanted his rx to be sent over to [facility] it  was called in.  I spoke with Pam at [facility] to cancel previous order from 04/24/18, it was shipped out on 04/24/18.  I spoke with the patient and he was advised that Rx is scheduled to arrive to his home on 05/01/18. [facility] [address] cancelled.

## 2018-05-05 ENCOUNTER — Other Ambulatory Visit: Payer: Self-pay | Admitting: Physician Assistant

## 2018-05-10 ENCOUNTER — Telehealth: Payer: Self-pay | Admitting: Physician Assistant

## 2018-05-10 NOTE — Telephone Encounter (Signed)
Called and spoke with pt regarding their appt on 06/08/18. I advised that Timmothy Euler is leaving our practice and that we would need to reschedule with a different provider. I advised of the 3 providers that are accepting new patients : Marga Hoots and [place]. Pt states that he will call in the morning to make the appointment reschedule. Please make an OV with the provider of his choice for 3 month f/u for diabetes.  Thank you!

## 2018-05-12 NOTE — Telephone Encounter (Signed)
Message sent to [place] Re: recommend provider to take over his care

## 2018-05-12 NOTE — Telephone Encounter (Signed)
Copied from [facility] [id number]. Topic: General - Inquiry >> May 12, 2018  9:16 AM Conception Chancy, NT wrote: Reason for CRM: patient is calling and requesting to speak with Tenna Delaine on her recommendations for him since she is leaving on 05/17/18.

## 2018-06-07 ENCOUNTER — Observation Stay (HOSPITAL_COMMUNITY): Payer: 59

## 2018-06-07 ENCOUNTER — Encounter (HOSPITAL_COMMUNITY): Payer: Self-pay | Admitting: Emergency Medicine

## 2018-06-07 ENCOUNTER — Emergency Department (HOSPITAL_COMMUNITY): Payer: 59

## 2018-06-07 ENCOUNTER — Other Ambulatory Visit: Payer: Self-pay

## 2018-06-07 ENCOUNTER — Observation Stay (HOSPITAL_COMMUNITY)
Admission: EM | Admit: 2018-06-07 | Discharge: 2018-06-08 | Disposition: A | Discharge status: Home / Self Care | Payer: 59 | Attending: Family Medicine | Admitting: Family Medicine

## 2018-06-07 DIAGNOSIS — G459 Transient cerebral ischemic attack, unspecified: Secondary | ICD-10-CM

## 2018-06-07 DIAGNOSIS — G4730 Sleep apnea, unspecified: Secondary | ICD-10-CM | POA: Diagnosis not present

## 2018-06-07 DIAGNOSIS — F17210 Nicotine dependence, cigarettes, uncomplicated: Secondary | ICD-10-CM | POA: Insufficient documentation

## 2018-06-07 DIAGNOSIS — Z7982 Long term (current) use of aspirin: Secondary | ICD-10-CM | POA: Diagnosis not present

## 2018-06-07 DIAGNOSIS — I25119 Atherosclerotic heart disease of native coronary artery with unspecified angina pectoris: Secondary | ICD-10-CM | POA: Diagnosis not present

## 2018-06-07 DIAGNOSIS — E039 Hypothyroidism, unspecified: Secondary | ICD-10-CM | POA: Diagnosis present

## 2018-06-07 DIAGNOSIS — R253 Fasciculation: Secondary | ICD-10-CM | POA: Diagnosis not present

## 2018-06-07 DIAGNOSIS — E119 Type 2 diabetes mellitus without complications: Secondary | ICD-10-CM

## 2018-06-07 DIAGNOSIS — Z96642 Presence of left artificial hip joint: Secondary | ICD-10-CM | POA: Insufficient documentation

## 2018-06-07 DIAGNOSIS — G4733 Obstructive sleep apnea (adult) (pediatric): Secondary | ICD-10-CM

## 2018-06-07 DIAGNOSIS — E785 Hyperlipidemia, unspecified: Secondary | ICD-10-CM | POA: Diagnosis not present

## 2018-06-07 DIAGNOSIS — Z79899 Other long term (current) drug therapy: Secondary | ICD-10-CM | POA: Diagnosis not present

## 2018-06-07 DIAGNOSIS — I10 Essential (primary) hypertension: Secondary | ICD-10-CM | POA: Diagnosis not present

## 2018-06-07 DIAGNOSIS — I2510 Atherosclerotic heart disease of native coronary artery without angina pectoris: Secondary | ICD-10-CM | POA: Insufficient documentation

## 2018-06-07 DIAGNOSIS — E118 Type 2 diabetes mellitus with unspecified complications: Secondary | ICD-10-CM

## 2018-06-07 DIAGNOSIS — G514 Facial myokymia: Secondary | ICD-10-CM | POA: Diagnosis present

## 2018-06-07 DIAGNOSIS — G450 Vertebro-basilar artery syndrome: Secondary | ICD-10-CM | POA: Diagnosis not present

## 2018-06-07 LAB — I-STAT CHEM 8, ED
BUN: 13 mg/dL (ref 6–20)
Calcium, Ion: 1.04 mmol/L — ABNORMAL LOW (ref 1.15–1.40)
Chloride: 101 mmol/L (ref 98–111)
Creatinine, Ser: 0.9 mg/dL (ref 0.61–1.24)
Glucose, Bld: 97 mg/dL (ref 70–99)
HCT: 53 % — ABNORMAL HIGH (ref 39.0–52.0)
Hemoglobin: 18 g/dL — ABNORMAL HIGH (ref 13.0–17.0)
Potassium: 3.2 mmol/L — ABNORMAL LOW (ref 3.5–5.1)
Sodium: 138 mmol/L (ref 135–145)
TCO2: 24 mmol/L (ref 22–32)

## 2018-06-07 LAB — CBC
HCT: 51.9 % (ref 39.0–52.0)
Hemoglobin: 17 g/dL (ref 13.0–17.0)
MCH: 30.2 pg (ref 26.0–34.0)
MCHC: 32.8 g/dL (ref 30.0–36.0)
MCV: 92.2 fL (ref 80.0–100.0)
Platelets: 244 10*3/uL (ref 150–400)
RBC: 5.63 MIL/uL (ref 4.22–5.81)
RDW: 14.7 % (ref 11.5–15.5)
WBC: 9.4 10*3/uL (ref 4.0–10.5)
nRBC: 0 % (ref 0.0–0.2)

## 2018-06-07 LAB — COMPREHENSIVE METABOLIC PANEL
ALT: 68 U/L — ABNORMAL HIGH (ref 0–44)
AST: 46 U/L — ABNORMAL HIGH (ref 15–41)
Albumin: 4 g/dL (ref 3.5–5.0)
Alkaline Phosphatase: 47 U/L (ref 38–126)
Anion gap: 16 — ABNORMAL HIGH (ref 5–15)
BILIRUBIN TOTAL: 0.6 mg/dL (ref 0.3–1.2)
BUN: 12 mg/dL (ref 6–20)
CHLORIDE: 100 mmol/L (ref 98–111)
CO2: 23 mmol/L (ref 22–32)
Calcium: 9.3 mg/dL (ref 8.9–10.3)
Creatinine, Ser: 0.77 mg/dL (ref 0.61–1.24)
GFR calc Af Amer: >60 mL/min (ref >60)
GFR calc non Af Amer: >60 mL/min (ref >60)
Glucose, Bld: 102 mg/dL — ABNORMAL HIGH (ref 70–99)
Potassium: 3.2 mmol/L — ABNORMAL LOW (ref 3.5–5.1)
SODIUM: 139 mmol/L (ref 135–145)
Total Protein: 7.2 g/dL (ref 6.5–8.1)

## 2018-06-07 LAB — DIFFERENTIAL
Abs Immature Granulocytes: 0.06 10*3/uL (ref 0.00–0.07)
Basophils Absolute: 0.2 10*3/uL — ABNORMAL HIGH (ref 0.0–0.1)
Basophils Relative: 2 %
Eosinophils Absolute: 0.4 10*3/uL (ref 0.0–0.5)
Eosinophils Relative: 4 %
Immature Granulocytes: 1 %
LYMPHS PCT: 27 %
Lymphs Abs: 2.5 10*3/uL (ref 0.7–4.0)
Monocytes Absolute: 0.7 10*3/uL (ref 0.1–1.0)
Monocytes Relative: 7 %
Neutro Abs: 5.6 10*3/uL (ref 1.7–7.7)
Neutrophils Relative %: 59 %

## 2018-06-07 LAB — PROTIME-INR
INR: 0.99
Prothrombin Time: 13 seconds (ref 11.4–15.2)

## 2018-06-07 LAB — I-STAT TROPONIN, ED: Troponin i, poc: 0.01 ng/mL (ref 0.00–0.08)

## 2018-06-07 LAB — SALICYLATE LEVEL: Salicylate Lvl: 15.5 mg/dL (ref 2.8–30.0)

## 2018-06-07 LAB — APTT: aPTT: 26 seconds (ref 24–36)

## 2018-06-07 MED ORDER — ACETAMINOPHEN 325 MG PO TABS: 650.0000 mg | ORAL_TABLET | ORAL | Status: DC | PRN

## 2018-06-07 MED ORDER — ACETAMINOPHEN 650 MG RE SUPP: 650.0000 mg | RECTAL | Status: DC | PRN

## 2018-06-07 MED ORDER — MORPHINE SULFATE 15 MG PO TABS: 30.0000 mg | ORAL_TABLET | Freq: Four times a day (QID) | ORAL | Status: DC | PRN

## 2018-06-07 MED ORDER — PANTOPRAZOLE SODIUM 40 MG PO TBEC
80.0000 mg | DELAYED_RELEASE_TABLET | Freq: Every day | ORAL | Status: DC
  Administered 2018-06-08: 80 mg via ORAL
  Filled 2018-06-07: qty 2

## 2018-06-07 MED ORDER — METFORMIN HCL ER 500 MG PO TB24
1000.0000 mg | ORAL_TABLET | Freq: Two times a day (BID) | ORAL | Status: DC
  Administered 2018-06-08: 1000 mg via ORAL
  Filled 2018-06-07 (×2): qty 2

## 2018-06-07 MED ORDER — ATORVASTATIN CALCIUM 40 MG PO TABS
40.0000 mg | ORAL_TABLET | Freq: Every day | ORAL | Status: DC
  Administered 2018-06-08: 40 mg via ORAL
  Filled 2018-06-07: qty 1

## 2018-06-07 MED ORDER — ACETAMINOPHEN 160 MG/5ML PO SOLN: 650.0000 mg | ORAL | Status: DC | PRN

## 2018-06-07 MED ORDER — CANAGLIFLOZIN 100 MG PO TABS
100.0000 mg | ORAL_TABLET | Freq: Every day | ORAL | Status: DC
  Administered 2018-06-08: 100 mg via ORAL
  Filled 2018-06-07: qty 1

## 2018-06-07 MED ORDER — STROKE: EARLY STAGES OF RECOVERY BOOK
Freq: Once | Status: AC
  Administered 2018-06-07: 23:00:00
  Filled 2018-06-07: qty 1

## 2018-06-07 MED ORDER — LEVOTHYROXINE SODIUM 25 MCG PO TABS
125.0000 ug | ORAL_TABLET | Freq: Every day | ORAL | Status: DC
  Administered 2018-06-08: 125 ug via ORAL
  Filled 2018-06-07 (×2): qty 1
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
## 2018-06-07 NOTE — ED Provider Notes (Signed)
MSE was initiated and I personally evaluated the patient and placed orders (if any) at  6:35 PM on June 07, 2018.  The patient appears stable so that the remainder of the MSE may be completed by another provider.  Called to see patient.  Facial droop and eye twitching.  Eye twitching is been going on for about a week or 2 now.  Facial droop started a couple hours ago.  Still some asymmetry on face.  Does have a subtle droop on the left side but appears forehead is also involved.  However with the eye twitching not clearly a Bell's.  However I think with just facial droop would not be enough for TPA or acute intervention.  We will not activate a code stroke.  Will have seen by another provider.   Davonna Belling, MD 06/07/18 [phone]

## 2018-06-07 NOTE — ED Triage Notes (Addendum)
Pt coming in for evaluation of a possible left facial droop that started about two hours ago LSN at 1630. Pt has had left eye twitching for the last two months. Reports 2/10 headache. No other neuro deficits noted. Pt reports he was diagnosed with Bell's Palsy when he was 1.   Side note Pt reports he takes more than 8 BC powders a day and has heard it causes aneurysms.

## 2018-06-08 ENCOUNTER — Ambulatory Visit: Payer: Medicare Other | Admitting: Physician Assistant

## 2018-06-08 ENCOUNTER — Observation Stay (HOSPITAL_COMMUNITY): Payer: 59

## 2018-06-08 ENCOUNTER — Observation Stay (HOSPITAL_BASED_OUTPATIENT_CLINIC_OR_DEPARTMENT_OTHER): Payer: 59

## 2018-06-08 DIAGNOSIS — I10 Essential (primary) hypertension: Secondary | ICD-10-CM

## 2018-06-08 DIAGNOSIS — E785 Hyperlipidemia, unspecified: Secondary | ICD-10-CM | POA: Diagnosis not present

## 2018-06-08 DIAGNOSIS — G459 Transient cerebral ischemic attack, unspecified: Secondary | ICD-10-CM

## 2018-06-08 DIAGNOSIS — I25119 Atherosclerotic heart disease of native coronary artery with unspecified angina pectoris: Secondary | ICD-10-CM | POA: Diagnosis not present

## 2018-06-08 DIAGNOSIS — R253 Fasciculation: Secondary | ICD-10-CM | POA: Diagnosis not present

## 2018-06-08 DIAGNOSIS — G514 Facial myokymia: Secondary | ICD-10-CM | POA: Diagnosis not present

## 2018-06-08 DIAGNOSIS — R29810 Facial weakness: Secondary | ICD-10-CM | POA: Diagnosis not present

## 2018-06-08 LAB — LIPID PANEL
CHOL/HDL RATIO: 4.6 ratio
Cholesterol: 132 mg/dL (ref 0–200)
HDL: 29 mg/dL — ABNORMAL LOW (ref >40)
LDL Cholesterol: 69 mg/dL (ref 0–99)
Triglycerides: 172 mg/dL — ABNORMAL HIGH (ref <150)
VLDL: 34 mg/dL (ref 0–40)

## 2018-06-08 LAB — HEMOGLOBIN A1C
HEMOGLOBIN A1C: 6.6 % — AB (ref 4.8–5.6)
Mean Plasma Glucose: 142.72 mg/dL

## 2018-06-08 LAB — ECHOCARDIOGRAM COMPLETE
Height: 73 in
Weight: 4288 oz

## 2018-06-08 MED ORDER — PERFLUTREN LIPID MICROSPHERE
1.0000 mL | INTRAVENOUS | Status: AC | PRN
  Administered 2018-06-08: 2 mL via INTRAVENOUS
  Filled 2018-06-08: qty 10

## 2018-06-08 NOTE — Discharge Summary (Signed)
Physician Discharge Summary  Colin Ramirez GGY:[id number] DOB: 1966-05-12 DOA: 06/07/2018  PCP: Colin Delaine D, PA-C  Admit date: 06/07/2018 Discharge date: 06/08/2018  Admitted From: Home Disposition: Home  Recommendations for Outpatient Follow-up:  1. Follow up with PCP in 1 week 2. Follow up with neurology 3. Please obtain BMP/CBC in one week 4. Please follow up on the following pending results: None  Home Health: None Equipment/Devices: None  Discharge Condition: Stable CODE STATUS: Full code Diet recommendation: Heart healthy   Brief/Interim Summary:  Admission HPI written by Colin Quill, DO   Chief Complaint: L facial droop  HPI: Colin Ramirez is a 53 y.o. male with medical history significant of CAD, HTN, DM2, OSA.  Bell's Palsy when age 31, chronic headache for which he takes too much BC goodies powder.  Patient presents to the ED with c/o L facial droop that onset ~2 hours ago.  LSN 1630.  Also has had eye twitching for past 2 months.  No other neuro deficits noted.  Symptoms initially seemed to be getting better but still seem to be present on my exam.   ED Course: CT head neg.  EDP spoke with neuro who recd admit and TIA work up given his risk factors.   Hospital course:  Left facial twitching Initial concern for TIA vs stroke. MRI/MRA negative for acute stroke. Carotid dopplers without significant disease and no thrombus seen on echocardiogram. Neurology and stroke team evaluated and do not think patient had a stroke or TIA and that his twitching is likely related to prior history of Bells Palsy. No changes to aspirin.  Chronic ASA use Advised to discontinue use of BC powder  Essential hypertension Resume antihypertensives on discharge.  Diabetes mellitus, type 2 Hyperglycemia. Resume home regimen.  Hypothyroidism Continue Synthroid  Hyperlipidemia Continue Lipitor  Discharge Diagnoses:  Principal Problem:   Facial  twitching Active Problems:   DM2 (diabetes mellitus, type 2) (HCC)   HTN (hypertension)   Hyperlipidemia   Sleep apnea   Hypothyroidism   CAD (coronary artery disease)    Discharge Instructions  Discharge Instructions    Ambulatory referral to Neurology   Complete by:  As directed    Follow up for hemifacial spasms.  Any provider appropriate. (Seen in the hospital for stroke but had no stroke, no TIA)   Call MD for:  difficulty breathing, headache or visual disturbances   Complete by:  As directed      Allergies as of 06/08/2018      Reactions   Omnicef [cefdinir] Rash      Medication List    STOP taking these medications   BC FAST PAIN RELIEF 650-195-33.3 MG Pack Generic drug:  Aspirin-Salicylamide-Caffeine   methocarbamol 500 MG tablet Commonly known as:  ROBAXIN     TAKE these medications   amLODipine 10 MG tablet Commonly known as:  NORVASC TAKE 1 TABLET DAILY. What changed:    how much to take  how to take this  when to take this  additional instructions   aspirin 81 MG EC tablet TAKE 1 TABLET BY MOUTH EVERY DAY   atorvastatin 40 MG tablet Commonly known as:  LIPITOR TAKE 1 TABLET DAILY. What changed:    how much to take  how to take this  when to take this  additional instructions   carvedilol 6.25 MG tablet Commonly known as:  COREG Take 1 tablet (6.25 mg total) by mouth 2 (two) times daily.   dapagliflozin  propanediol 5 MG Tabs tablet Commonly known as:  FARXIGA Take 5 mg by mouth daily.   esomeprazole 40 MG capsule Commonly known as:  NEXIUM TAKE 1 CAPSULE BY MOUTH EVERY DAY What changed:    how much to take  how to take this  when to take this  additional instructions   hydrochlorothiazide 25 MG tablet Commonly known as:  HYDRODIURIL Take 1 tablet (25 mg total) by mouth daily.   levothyroxine 125 MCG tablet Commonly known as:  SYNTHROID, LEVOTHROID Take 1 tablet (125 mcg total) by mouth daily.   metFORMIN 500 MG  24 hr tablet Commonly known as:  GLUCOPHAGE-XR Take 2 tablets (1,000 mg total) by mouth 2 (two) times daily with a meal.   nitroGLYCERIN 0.4 MG SL tablet Commonly known as:  NITROSTAT Place 1 tablet (0.4 mg total) under the tongue every 5 (five) minutes as needed for chest pain.   oxymorphone 10 MG tablet Commonly known as:  OPANA Take 10 mg by mouth every 6 (six) hours as needed for pain.   ramipril 10 MG capsule Commonly known as:  ALTACE Take 1 capsule (10 mg total) by mouth daily.      Follow-up Information    [facility] Follow up in 4 week(s).   Specialty:  Neurology Why:  Office will call with appointment date and time Contact information: [place] [phone]       Colin Ramirez, [place]. Schedule an appointment as soon as possible for a visit in 1 week(s).   Specialties:  Librarian, academic, Family Medicine Contact information: [place] [address] [phone]          Allergies  Allergen Reactions  . Omnicef [Cefdinir] Rash    Consultations:  Neurology/stroke   Procedures/Studies: Ct Head Wo Contrast  Result Date: 06/07/2018 CLINICAL DATA:  Left facial droop and intermittent twitching for the past 2 weeks. History of Bell's palsy when very young. EXAM: CT HEAD WITHOUT CONTRAST TECHNIQUE: Contiguous axial images were obtained from the base of the skull through the vertex without intravenous contrast. COMPARISON:  None. FINDINGS: Brain: Normal appearing cerebral hemispheres and posterior fossa structures. Normal size and position of the ventricles. No intracranial hemorrhage, mass lesion or CT evidence of acute infarction. Vascular: Mildly enlarged and tortuous basilar and vertebral arteries. Mild bilateral vertebral and internal carotid artery atheromatous calcifications. No hyperdense vessel or unexpected calcification. Skull: Normal. Negative for fracture or focal  lesion. Sinuses/Orbits: Unremarkable. Other: None. IMPRESSION: 1. Mild vertebrobasilar dolichoectasia. 2. No acute abnormality. Electronically Signed   By: Claudie Revering M.Ramirez.   On: 06/07/2018 19:19   Mr Brain Wo Contrast (neuro Protocol)  Result Date: 06/08/2018 CLINICAL DATA:  52 y/o M; left facial droop and intermittent twitching for 2 weeks. History of Bell's palsy when young. EXAM: MRI HEAD WITHOUT CONTRAST MRA HEAD WITHOUT CONTRAST TECHNIQUE: Multiplanar, multiecho pulse sequences of the brain and surrounding structures were obtained without intravenous contrast. Angiographic images of the head were obtained using MRA technique without contrast. COMPARISON:  06/07/2018 CT head. FINDINGS: MRI HEAD FINDINGS Brain: No acute infarction, hemorrhage, hydrocephalus, extra-axial collection or mass lesion. Few nonspecific T2 FLAIR hyperintensities in subcortical and periventricular white matter are compatible with mild chronic microvascular ischemic changes for age. Mild volume loss of the brain. Vascular: Normal flow voids. Skull and upper cervical spine: Normal marrow signal. Sinuses/Orbits: Negative. Other: None. MRA HEAD FINDINGS Anterior circulation: No large vessel occlusion, aneurysm, or significant  stenosis is identified. Posterior circulation: No large vessel occlusion, aneurysm, or significant stenosis is identified. Anatomic variant: None significant. IMPRESSION: 1. No acute intracranial abnormality identified. 2. Patent anterior and posterior intracranial circulation. No large vessel occlusion, aneurysm, or significant stenosis is identified. 3. Mild chronic microvascular ischemic changes and volume loss of the brain. Electronically Signed   By: Kristine Garbe M.Ramirez.   On: 06/08/2018 04:15   Mr Jodene Nam Head Wo Contrast  Result Date: 06/08/2018 CLINICAL DATA:  52 y/o M; left facial droop and intermittent twitching for 2 weeks. History of Bell's palsy when young. EXAM: MRI HEAD WITHOUT CONTRAST  MRA HEAD WITHOUT CONTRAST TECHNIQUE: Multiplanar, multiecho pulse sequences of the brain and surrounding structures were obtained without intravenous contrast. Angiographic images of the head were obtained using MRA technique without contrast. COMPARISON:  06/07/2018 CT head. FINDINGS: MRI HEAD FINDINGS Brain: No acute infarction, hemorrhage, hydrocephalus, extra-axial collection or mass lesion. Few nonspecific T2 FLAIR hyperintensities in subcortical and periventricular white matter are compatible with mild chronic microvascular ischemic changes for age. Mild volume loss of the brain. Vascular: Normal flow voids. Skull and upper cervical spine: Normal marrow signal. Sinuses/Orbits: Negative. Other: None. MRA HEAD FINDINGS Anterior circulation: No large vessel occlusion, aneurysm, or significant stenosis is identified. Posterior circulation: No large vessel occlusion, aneurysm, or significant stenosis is identified. Anatomic variant: None significant. IMPRESSION: 1. No acute intracranial abnormality identified. 2. Patent anterior and posterior intracranial circulation. No large vessel occlusion, aneurysm, or significant stenosis is identified. 3. Mild chronic microvascular ischemic changes and volume loss of the brain. Electronically Signed   By: Kristine Garbe M.Ramirez.   On: 06/08/2018 04:15   Vas US Carotid (at Axis Only)  Result Date: 06/08/2018 Carotid Arterial Duplex Study Indications: TIA. Limitations: High bifurcations Performing Technologist: [employer]  Examination Guidelines: A complete evaluation includes B-mode imaging, spectral Doppler, color Doppler, and power Doppler as needed of all accessible portions of each vessel. Bilateral testing is considered an integral part of a complete examination. Limited examinations for reoccurring indications may be performed as noted.  Right Carotid Findings: +----------+--------+--------+--------+------------+-------------------------+            PSV cm/sEDV cm/sStenosisDescribe    Comments                  +----------+--------+--------+--------+------------+-------------------------+ CCA Prox  83      18                                                    +----------+--------+--------+--------+------------+-------------------------+ CCA Distal79      16                          mild imtimal wall changes +----------+--------+--------+--------+------------+-------------------------+ ICA Prox  64      12              heterogenousmild plaque at the origin +----------+--------+--------+--------+------------+-------------------------+ ICA Mid   71      23                                                    +----------+--------+--------+--------+------------+-------------------------+ ICA Distal64      20  tortuous                  +----------+--------+--------+--------+------------+-------------------------+ ECA       97      14                                                    +----------+--------+--------+--------+------------+-------------------------+ +----------+--------+-------+--------+-------------------+           PSV cm/sEDV cmsDescribeArm Pressure (mmHG) +----------+--------+-------+--------+-------------------+ [id number]                                         +----------+--------+-------+--------+-------------------+ +---------+--------+--+--------+-+ VertebralPSV cm/s29EDV cm/s6 +---------+--------+--+--------+-+  Left Carotid Findings: +----------+--------+--------+--------+--------------------+-------------------+           PSV cm/sEDV cm/sStenosisDescribe            Comments            +----------+--------+--------+--------+--------------------+-------------------+ CCA Prox  112     14                                                      +----------+--------+--------+--------+--------------------+-------------------+ CCA Distal88       19                                                      +----------+--------+--------+--------+--------------------+-------------------+ ICA Prox  93      17              focal and hypoechoicon the far wall at                                                        the origin          +----------+--------+--------+--------+--------------------+-------------------+ ICA Mid   122     20                                                      +----------+--------+--------+--------+--------------------+-------------------+ ICA Distal61      21                                  tortuous            +----------+--------+--------+--------+--------------------+-------------------+ ECA       96      13              heterogenous        minimal plaque at  the origin          +----------+--------+--------+--------+--------------------+-------------------+ +----------+--------+--------+--------+-------------------+ SubclavianPSV cm/sEDV cm/sDescribeArm Pressure (mmHG) +----------+--------+--------+--------+-------------------+           93                                          +----------+--------+--------+--------+-------------------+ +---------+--------+--+--------+-+ VertebralPSV cm/s40EDV cm/s9 +---------+--------+--+--------+-+  Summary: Right Carotid: Velocities in the right ICA are consistent with a 1-39% stenosis. Left Carotid: Velocities in the left ICA are consistent with a 1-39% stenosis. Vertebrals:  Bilateral vertebral arteries demonstrate antegrade flow. Subclavians: Normal flow hemodynamics were seen in bilateral subclavian              arteries. *See table(s) above for measurements and observations.  Electronically signed by Antony Contras MD on 06/08/2018 at 1:44:21 PM.    Final       Subjective: No issues overnight.  Discharge Exam: Vitals:   06/08/18 1210 06/08/18 1612  BP: 117/83 (P)  125/81  Pulse: 70 (P) 66  Resp: 16 (P) 18  Temp: 98.1 F (36.7 C) (P) 98.2 F (36.8 C)  SpO2: 95% (P) 96%   Vitals:   06/08/18 0541 06/08/18 0812 06/08/18 1210 06/08/18 1612  BP: 110/71 103/83 117/83 (P) 125/81  Pulse: 63 68 70 (P) 66  Resp: 17 14 16  (P) 18  Temp: 98.6 F (37 C) 98.6 F (37 C) 98.1 F (36.7 C) (P) 98.2 F (36.8 C)  TempSrc: Oral Oral Oral (P) Oral  SpO2: 98% 97% 95% (P) 96%  Weight:      Height:        General: Pt is alert, awake, not in acute distress Cardiovascular: RRR, S1/S2 +, no rubs, no gallops Respiratory: CTA bilaterally, no wheezing, no rhonchi Abdominal: Soft, NT, ND, bowel sounds + Extremities: no edema, no cyanosis    The results of significant diagnostics from this hospitalization (including imaging, microbiology, ancillary and laboratory) are listed below for reference.      Labs: BNP (last 3 results) No results for input(s): BNP in the last 8760 hours. Basic Metabolic Panel: Recent Labs  Lab 06/07/18 1851 06/07/18 1901  NA 139 138  K 3.2* 3.2*  CL 100 101  CO2 23  --   GLUCOSE 102* 97  BUN 12 13  CREATININE 0.77 0.90  CALCIUM 9.3  --    Liver Function Tests: Recent Labs  Lab 06/07/18 1851  AST 46*  ALT 68*  ALKPHOS 47  BILITOT 0.6  PROT 7.2  ALBUMIN 4.0   CBC: Recent Labs  Lab 06/07/18 1851 06/07/18 1901  WBC 9.4  --   NEUTROABS 5.6  --   HGB 17.0 18.0*  HCT 51.9 53.0*  MCV 92.2  --   PLT 244  --    Hgb A1c Recent Labs    06/08/18 0458  HGBA1C 6.6*   Lipid Profile Recent Labs    06/08/18 0458  CHOL 132  HDL 29*  LDLCALC 69  TRIG 172*  CHOLHDL 4.6   Urinalysis    Component Value Date/Time   APPEARANCEUR Clear 03/08/2018 0836   GLUCOSEU 3+ (A) 03/08/2018 0836   BILIRUBINUR Negative 03/08/2018 0836   PROTEINUR Negative 03/08/2018 0836   UROBILINOGEN 0.2 12/02/2011 1201   NITRITE Negative 03/08/2018 0836   LEUKOCYTESUR Negative 03/08/2018 0836    SIGNED:   Cordelia Poche, MD [facility] 06/08/2018, 4:47 PM

## 2018-06-08 NOTE — Progress Notes (Signed)
  Echocardiogram 2D Echocardiogram has been performed.  Colin Ramirez L Androw 06/08/2018, 12:05 PM

## 2018-06-08 NOTE — Progress Notes (Signed)
Bilateral carotid duplex completed - 1% to 39%. Full preliminary results found in chart review CV Proc. [place] Meriah Shands,RVS 06/08/2018, 11:12 AM

## 2018-06-08 NOTE — Evaluation (Signed)
Occupational Therapy Evaluation and Discharge Patient Details Name: Colin Ramirez MRN: [id number] DOB: [birth year] Today's Date: 06/08/2018    History of Present Illness QUENTON RECENDEZ is a 52 y.o. male with a history of multiple stroke risk factors including diabetes, hypertension, hyperlipidemia, coronary artery disease who presents and h/o Bell's palsy at age 89 presented with transient facial droop. CT and MRI negative for stroke   Clinical Impression   This 52 yo male admitted with above presents to acute OT at an Independent to Mod I level with all basic ADLs and mobility (have let PT know that no PT needs were identified). No further OT needs, we will sign off.     Follow Up Recommendations  No OT follow up    Equipment Recommendations  None recommended by OT       Precautions / Restrictions Precautions Precautions: None Restrictions Weight Bearing Restrictions: No      Mobility Bed Mobility Overal bed mobility: Independent                Transfers Overall transfer level: Independent Equipment used: None             General transfer comment: up and down flight of stairs Mod I (up step to pattern due to stiff left hip from prior surgery, down step over step); ambulated 200 feet independently    Balance Overall balance assessment: No apparent balance deficits (not formally assessed)                                         ADL either performed or assessed with clinical judgement   ADL Overall ADL's : Independent                                             Vision Baseline Vision/History: No visual deficits(still has left eye twitching)              Pertinent Vitals/Pain Pain Assessment: No/denies pain     Hand Dominance Right   Extremity/Trunk Assessment Upper Extremity Assessment Upper Extremity Assessment: Overall WFL for tasks assessed           Communication Communication Communication: No  difficulties   Cognition Arousal/Alertness: Awake/alert Behavior During Therapy: WFL for tasks assessed/performed Overall Cognitive Status: Within Functional Limits for tasks assessed                                                Home Living Family/patient expects to be discharged to:: Private residence Living Arrangements: Spouse/significant other Available Help at Discharge: Family;Available PRN/intermittently Type of Home: House Home Access: Stairs to enter [employer] of Steps: 3 Entrance Stairs-Rails: None Home Layout: One level     Bathroom Shower/Tub: Tub/shower unit;Curtain   Biochemist, clinical: Standard Bathroom Accessibility: No   Home Equipment: Environmental consultant - 2 wheels;Crutches          Prior Functioning/Environment Level of Independence: Independent                          OT Goals(Current goals can be found in the care plan section) Acute Rehab OT  Goals Patient Stated Goal: make sure everything is alright  OT Frequency:                AM-PAC OT "6 Clicks" Daily Activity     Outcome Measure Help from another person eating meals?: None Help from another person taking care of personal grooming?: None Help from another person toileting, which includes using toliet, bedpan, or urinal?: None Help from another person bathing (including washing, rinsing, drying)?: None Help from another person to put on and taking off regular upper body clothing?: None Help from another person to put on and taking off regular lower body clothing?: None 6 Click Score: 24   End of Session Equipment Utilized During Treatment: (none)  Activity Tolerance: Patient tolerated treatment well Patient left: (sitting EOB)                   Time: 5320-2334 OT Time Calculation (min): 26 min Charges:  OT General Charges $OT Visit: 1 Visit OT Evaluation $OT Eval Moderate Complexity: 1 Mod OT Treatments $Self Care/Home Management : 8-22  mins  [address], OTR/L Acute [employer] Pager [phone] Office [phone]     Almon Register 06/08/2018, 8:16 AM
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
## 2018-06-08 NOTE — Discharge Instructions (Signed)
Colin Ramirez,  You were in the hospital for concern of a stroke or TIA (transient ischemic attack). Your workup was unremarkable and the neurologist thinks this is facial twitching that might be related to your previous history of Bells Palsy. Please consider finding an alternative to the [facility] powder that you use for chronic headaches. Please follow-up with your PCP and the neurologist.

## 2018-06-08 NOTE — Progress Notes (Signed)
PT Cancellation Note  Patient Details Name: Colin Ramirez MRN: [id number] DOB: 1966-06-26   Cancelled Treatment:    Reason Eval/Treat Not Completed: PT screened, no needs identified, will sign off. Per OT, no acute PT needs identified at this time. PT signing off.    [place] 06/08/2018, 4:46 PM

## 2018-06-08 NOTE — Progress Notes (Addendum)
STROKE TEAM PROGRESS NOTE   INTERVAL HISTORY His wife is at the bedside.  They recounted HPI with Dr. Leonie Man. Yesterday, eye twitching increased yesterday that had been present for months previously. Yesterday, also difficulty speaking, difficulty walking but could walk. MRI neg. He has childhood history of left-sided Bell's palsy and he had facial twitchings on the same side  Vitals:   06/07/18 2358 06/08/18 0145 06/08/18 0541 06/08/18 0812  BP:  (!) 82/51 110/71 103/83  Pulse:  69 63 68  Resp: 19 18 17 14   Temp:  98.4 F (36.9 C) 98.6 F (37 C) 98.6 F (37 C)  TempSrc:  Oral Oral Oral  SpO2:  97% 98% 97%  Weight:      Height:        CBC:  Recent Labs  Lab 06/07/18 1851 06/07/18 1901  WBC 9.4  --   NEUTROABS 5.6  --   HGB 17.0 18.0*  HCT 51.9 53.0*  MCV 92.2  --   PLT 244  --     Basic Metabolic Panel:  Recent Labs  Lab 06/07/18 1851 06/07/18 1901  NA 139 138  K 3.2* 3.2*  CL 100 101  CO2 23  --   GLUCOSE 102* 97  BUN 12 13  CREATININE 0.77 0.90  CALCIUM 9.3  --    Lipid Panel:     Component Value Date/Time   CHOL 132 06/08/2018 0458   CHOL 175 03/09/2017 1137   TRIG 172 (H) 06/08/2018 0458   HDL 29 (L) 06/08/2018 0458   HDL 38 (L) 03/09/2017 1137   CHOLHDL 4.6 06/08/2018 0458   VLDL 34 06/08/2018 0458   LDLCALC 69 06/08/2018 0458   LDLCALC 95 03/09/2017 1137   HgbA1c:  Lab Results  Component Value Date   HGBA1C 6.6 (H) 06/08/2018   Urine Drug Screen: No results found for: LABOPIA, COCAINSCRNUR, LABBENZ, AMPHETMU, THCU, LABBARB  Alcohol Level No results found for: ETH  IMAGING Ct Head Wo Contrast  Result Date: 06/07/2018 CLINICAL DATA:  Left facial droop and intermittent twitching for the past 2 weeks. History of Bell's palsy when very young. EXAM: CT HEAD WITHOUT CONTRAST TECHNIQUE: Contiguous axial images were obtained from the base of the skull through the vertex without intravenous contrast. COMPARISON:  None. FINDINGS: Brain: Normal  appearing cerebral hemispheres and posterior fossa structures. Normal size and position of the ventricles. No intracranial hemorrhage, mass lesion or CT evidence of acute infarction. Vascular: Mildly enlarged and tortuous basilar and vertebral arteries. Mild bilateral vertebral and internal carotid artery atheromatous calcifications. No hyperdense vessel or unexpected calcification. Skull: Normal. Negative for fracture or focal lesion. Sinuses/Orbits: Unremarkable. Other: None. IMPRESSION: 1. Mild vertebrobasilar dolichoectasia. 2. No acute abnormality. Electronically Signed   By: Claudie Revering M.D.   On: 06/07/2018 19:19   Mr Brain Wo Contrast (neuro Protocol)  Result Date: 06/08/2018 CLINICAL DATA:  52 y/o M; left facial droop and intermittent twitching for 2 weeks. History of Bell's palsy when young. EXAM: MRI HEAD WITHOUT CONTRAST MRA HEAD WITHOUT CONTRAST TECHNIQUE: Multiplanar, multiecho pulse sequences of the brain and surrounding structures were obtained without intravenous contrast. Angiographic images of the head were obtained using MRA technique without contrast. COMPARISON:  06/07/2018 CT head. FINDINGS: MRI HEAD FINDINGS Brain: No acute infarction, hemorrhage, hydrocephalus, extra-axial collection or mass lesion. Few nonspecific T2 FLAIR hyperintensities in subcortical and periventricular white matter are compatible with mild chronic microvascular ischemic changes for age. Mild volume loss of the brain. Vascular: Normal flow voids.  Skull and upper cervical spine: Normal marrow signal. Sinuses/Orbits: Negative. Other: None. MRA HEAD FINDINGS Anterior circulation: No large vessel occlusion, aneurysm, or significant stenosis is identified. Posterior circulation: No large vessel occlusion, aneurysm, or significant stenosis is identified. Anatomic variant: None significant. IMPRESSION: 1. No acute intracranial abnormality identified. 2. Patent anterior and posterior intracranial circulation. No large  vessel occlusion, aneurysm, or significant stenosis is identified. 3. Mild chronic microvascular ischemic changes and volume loss of the brain. Electronically Signed   By: Kristine Garbe M.D.   On: 06/08/2018 04:15   Mr Jodene Nam Head Wo Contrast  Result Date: 06/08/2018 CLINICAL DATA:  52 y/o M; left facial droop and intermittent twitching for 2 weeks. History of Bell's palsy when young. EXAM: MRI HEAD WITHOUT CONTRAST MRA HEAD WITHOUT CONTRAST TECHNIQUE: Multiplanar, multiecho pulse sequences of the brain and surrounding structures were obtained without intravenous contrast. Angiographic images of the head were obtained using MRA technique without contrast. COMPARISON:  06/07/2018 CT head. FINDINGS: MRI HEAD FINDINGS Brain: No acute infarction, hemorrhage, hydrocephalus, extra-axial collection or mass lesion. Few nonspecific T2 FLAIR hyperintensities in subcortical and periventricular white matter are compatible with mild chronic microvascular ischemic changes for age. Mild volume loss of the brain. Vascular: Normal flow voids. Skull and upper cervical spine: Normal marrow signal. Sinuses/Orbits: Negative. Other: None. MRA HEAD FINDINGS Anterior circulation: No large vessel occlusion, aneurysm, or significant stenosis is identified. Posterior circulation: No large vessel occlusion, aneurysm, or significant stenosis is identified. Anatomic variant: None significant. IMPRESSION: 1. No acute intracranial abnormality identified. 2. Patent anterior and posterior intracranial circulation. No large vessel occlusion, aneurysm, or significant stenosis is identified. 3. Mild chronic microvascular ischemic changes and volume loss of the brain. Electronically Signed   By: Kristine Garbe M.D.   On: 06/08/2018 04:15   Vas US Carotid (at [place] Only)  Result Date: 06/08/2018 Carotid Arterial Duplex Study Indications: TIA. Limitations: High bifurcations Performing Technologist: [employer] [facility]   Examination Guidelines: A complete evaluation includes B-mode imaging, spectral Doppler, color Doppler, and power Doppler as needed of all accessible portions of each vessel. Bilateral testing is considered an integral part of a complete examination. Limited examinations for reoccurring indications may be performed as noted.  Right Carotid Findings: +----------+--------+--------+--------+------------+-------------------------+           PSV cm/sEDV cm/sStenosisDescribe    Comments                  +----------+--------+--------+--------+------------+-------------------------+ CCA Prox  83      18                                                    +----------+--------+--------+--------+------------+-------------------------+ CCA Distal79      16                          mild imtimal wall changes +----------+--------+--------+--------+------------+-------------------------+ ICA Prox  64      12              heterogenousmild plaque at the origin +----------+--------+--------+--------+------------+-------------------------+ ICA Mid   71      23                                                    +----------+--------+--------+--------+------------+-------------------------+  ICA Distal64      20                          tortuous                  +----------+--------+--------+--------+------------+-------------------------+ ECA       97      14                                                    +----------+--------+--------+--------+------------+-------------------------+ +----------+--------+-------+--------+-------------------+           PSV cm/sEDV cmsDescribeArm Pressure (mmHG) +----------+--------+-------+--------+-------------------+ [id number]                                         +----------+--------+-------+--------+-------------------+ +---------+--------+--+--------+-+ VertebralPSV cm/s29EDV cm/s6 +---------+--------+--+--------+-+  Left  Carotid Findings: +----------+--------+--------+--------+--------------------+-------------------+           PSV cm/sEDV cm/sStenosisDescribe            Comments            +----------+--------+--------+--------+--------------------+-------------------+ CCA Prox  112     14                                                      +----------+--------+--------+--------+--------------------+-------------------+ CCA Distal88      19                                                      +----------+--------+--------+--------+--------------------+-------------------+ ICA Prox  93      17              focal and hypoechoicon the far wall at                                                        the origin          +----------+--------+--------+--------+--------------------+-------------------+ ICA Mid   122     20                                                      +----------+--------+--------+--------+--------------------+-------------------+ ICA Distal61      21                                  tortuous            +----------+--------+--------+--------+--------------------+-------------------+ ECA       96      13              heterogenous  minimal plaque at                                                         the origin          +----------+--------+--------+--------+--------------------+-------------------+ +----------+--------+--------+--------+-------------------+ SubclavianPSV cm/sEDV cm/sDescribeArm Pressure (mmHG) +----------+--------+--------+--------+-------------------+           93                                          +----------+--------+--------+--------+-------------------+ +---------+--------+--+--------+-+ VertebralPSV cm/s40EDV cm/s9 +---------+--------+--+--------+-+  Summary: Right Carotid: Velocities in the right ICA are consistent with a 1-39% stenosis. Left Carotid: Velocities in the left ICA are consistent  with a 1-39% stenosis. Vertebrals:  Bilateral vertebral arteries demonstrate antegrade flow. Subclavians: Normal flow hemodynamics were seen in bilateral subclavian              arteries. *See table(s) above for measurements and observations.     Preliminary     PHYSICAL EXAM Obese middle-age Caucasian male not in distress. . Afebrile. Head is nontraumatic. Neck is supple without bruit.    Cardiac exam no murmur or gallop. Lungs are clear to auscultation. Distal pulses are well felt. Neurological Exam ;  Awake  Alert oriented x 3. Normal speech and language.eye movements full without nystagmus.fundi were not visualized. Vision acuity and fields appear normal. Hearing is normal. Palatal movements are normal. Face symmetric. Tongue midline. Normal strength, tone, reflexes and coordination. Normal sensation. Gait deferred.   ASSESSMENT/PLAN Mr. Colin Ramirez is a 53 y.o. male with history of htn. HLD, DB, CAD presenting with TRANSIENT FACIAL DROOP.   Intermittent hemifacial spasms versus facial dyskinesias from remote Bell's palsy (not stroke, no TIA)  CT head mild vertebrobasilar dolichoectasia  MRI  No acute stroke.  Small vessel disease. Atrophy.   MRA  Patent circulation.  Carotid Doppler  B ICA 1-39% stenosis, VAs antegrade   2D Echo  pending   LDL 69  HgbA1c 6.6  SCDs for VTE prophylaxis  aspirin 81 mg daily prior to admission, now on No antithrombotic.  Okay to continue aspirin at time of discharge.  Therapy recommendations: No therapy needs  Disposition: Return home  Neuro follow up needed for spasms - will ask [facility] to schedule follow up  Hypertension  Stable . BP goal normotensive  Hyperlipidemia  Home meds:  lipitor 40, resumed in hospital  LDL 69, goal < 70  Continue statin at discharge  Diabetes type II  HgbA1c 6.6, goal < 7.0  Controlled  Other Stroke Risk Factors  Cigarette smoker, advised to stop smoking  ETOH use, advised to drink no more  than 2 drink(s) a day  Obesity, Body mass index is 35.36 kg/m., recommend weight loss, diet and exercise as appropriate   Coronary artery disease  Migraines  Obstructive sleep apnea, on CPAP at home  Other Active Problems  Hx Bells at age 75-3  GERD  Hospital day # 0  Burnetta Sabin, MSN, APRN, ANVP-BC, AGPCNP-BC Advanced Practice Stroke Nurse [place] for Schedule & Pager information 06/08/2018 12:11 PM  I have personally obtained history,examined this patient, reviewed notes, independently viewed imaging studies, participated in medical decision making and plan of care.ROS completed  by me personally and pertinent positives fully documented  I have made any additions or clarifications directly to the above note. Agree with note above.  The patient has intermittent thick twitchings on the left side of his face which likely represents either facial dyskinesias from his remote Bell's palsy or hemifacial spasms. Neurovascular workup is unyielding. Recommend outpatient follow-up with neurology. No further stroke treatment or workup is necessary. Continue aspirin 325 mg daily.Discuss with Dr. Lonny Prude  . Greater than 50% time during this 25 minute visit was spent on counseling and coordination of care about his facial twitchings and discussion about stroke risk, prevention and treatment and answered questions  Antony Contras, MD Medical Director [facility] Pager: [phone] 06/08/2018 4:51 PM  To contact Stroke Continuity provider, please refer to [email or web address] After hours, contact General Neurology

## 2018-06-18 ENCOUNTER — Other Ambulatory Visit: Payer: Self-pay | Admitting: Physician Assistant

## 2018-06-18 DIAGNOSIS — E039 Hypothyroidism, unspecified: Secondary | ICD-10-CM

## 2018-06-18 DIAGNOSIS — E119 Type 2 diabetes mellitus without complications: Secondary | ICD-10-CM

## 2018-07-05 ENCOUNTER — Telehealth: Payer: Self-pay | Admitting: *Deleted

## 2018-07-05 ENCOUNTER — Telehealth: Payer: Self-pay

## 2018-07-05 NOTE — Telephone Encounter (Signed)
Faxed paperwork with successful notice 07/05/2018 at 10:48.

## 2018-07-05 NOTE — Telephone Encounter (Signed)
Pt arrived to office questioning orders for new CPAP machine/equipment.  Reviewed chart.  Per notes, pt was advised in August 2019 to contact original prescriber, neurology, for orders or to contact equipment company and notify us what orders are needed.  At that time, pt verbalized understanding.  No indication that pt provided [place] with any add'l information after that time.  Pt in lobby states his equipment is 55 years old and he shouldn't have to use equipment that old, increasingly agitated.  States information was sent from new equipment company 30 minutes ago.  Advised pt to take a seat for a minute so I could find fax.  Pt agitated stating again that his equipment is 55 years old, referenced decline of healthcare, increasingly raised voice, states he will be getting an attorney and walked out of office.

## 2018-07-30 ENCOUNTER — Other Ambulatory Visit: Payer: Self-pay | Admitting: Physician Assistant

## 2018-07-30 DIAGNOSIS — E119 Type 2 diabetes mellitus without complications: Secondary | ICD-10-CM

## 2018-07-31 NOTE — Telephone Encounter (Signed)
Requested medication (s) are due for refill today -yes  Requested medication (s) are on the active medication list -yes  Future visit scheduled -no  Last refill: 05/08/18  Notes to clinic: Left message for patient to call back to schedule 3 month follow up appointment. Rx written by Timmothy Euler- need another provider to fill courtesy 30 day supply.  Requested Prescriptions  Pending Prescriptions Disp Refills   metFORMIN (GLUCOPHAGE-XR) 500 MG 24 hr tablet [Pharmacy Med Name: METFORMIN HCL ER 500 MG TABLET] 360 tablet 0    Sig: TAKE 2 TABLETS (1,000 MG TOTAL) BY MOUTH 2 (TWO) TIMES DAILY WITH A MEAL.     Endocrinology:  Diabetes - Biguanides Passed - 07/30/2018 12:23 PM      Passed - Cr in normal range and within 360 days    Creat  Date Value Ref Range Status  04/01/2016 0.75 0.70 - 1.33 mg/dL Final    Comment:      For patients > or = 55 years of age: The upper reference limit for Creatinine is approximately 13% higher for people identified as African-American.      Creatinine, Ser  Date Value Ref Range Status  06/07/2018 0.90 0.61 - 1.24 mg/dL Final         Passed - HBA1C is between 0 and 7.9 and within 180 days    Hgb A1c MFr Bld  Date Value Ref Range Status  06/08/2018 6.6 (H) 4.8 - 5.6 % Final    Comment:    (NOTE) Pre diabetes:          5.7%-6.4% Diabetes:              >6.4% Glycemic control for   <7.0% adults with diabetes          Passed - eGFR in normal range and within 360 days    GFR, Est African American  Date Value Ref Range Status  04/01/2016 >89 >=60 mL/min Final   GFR calc Af Amer  Date Value Ref Range Status  06/07/2018 >60 >60 mL/min Final   GFR, Est Non African American  Date Value Ref Range Status  04/01/2016 >89 >=60 mL/min Final   GFR calc non Af Amer  Date Value Ref Range Status  06/07/2018 >60 >60 mL/min Final         Passed - Valid encounter within last 6 months    Recent Outpatient Visits          4 months ago Type 2 diabetes  mellitus without complication, without long-term current use of insulin ([place])   [facility] [place], [place] D, PA-C   6 months ago Type 2 diabetes mellitus without complication, without long-term current use of insulin ([place])   [facility] [place], [place] D, PA-C   8 months ago 2-vessel coronary artery disease   [facility] at [place], [place] D, PA-C   10 months ago Type 2 diabetes mellitus without complication, without long-term current use of insulin [facility])   [facility] at [place], [place] D, PA-C   1 year ago Hypothyroidism, unspecified type   [facility] at [facility], Reather Laurence, PA-C      Future Appointments            In 2 weeks Mcarthur Rossetti, MD [facility]            Requested Prescriptions  Pending Prescriptions Disp Refills   metFORMIN (GLUCOPHAGE-XR) 500 MG 24 hr tablet [Pharmacy Med Name: METFORMIN HCL  ER 500 MG TABLET] 360 tablet 0    Sig: TAKE 2 TABLETS (1,000 MG TOTAL) BY MOUTH 2 (TWO) TIMES DAILY WITH A MEAL.     Endocrinology:  Diabetes - Biguanides Passed - 07/30/2018 12:23 PM      Passed - Cr in normal range and within 360 days    Creat  Date Value Ref Range Status  04/01/2016 0.75 0.70 - 1.33 mg/dL Final    Comment:      For patients > or = 55 years of age: The upper reference limit for Creatinine is approximately 13% higher for people identified as African-American.      Creatinine, Ser  Date Value Ref Range Status  06/07/2018 0.90 0.61 - 1.24 mg/dL Final         Passed - HBA1C is between 0 and 7.9 and within 180 days    Hgb A1c MFr Bld  Date Value Ref Range Status  06/08/2018 6.6 (H) 4.8 - 5.6 % Final    Comment:    (NOTE) Pre diabetes:          5.7%-6.4% Diabetes:              >6.4% Glycemic control for   <7.0% adults with diabetes          Passed - eGFR in normal range and within 360 days    GFR, Est African American  Date Value Ref Range Status   04/01/2016 >89 >=60 mL/min Final   GFR calc Af Amer  Date Value Ref Range Status  06/07/2018 >60 >60 mL/min Final   GFR, Est Non African American  Date Value Ref Range Status  04/01/2016 >89 >=60 mL/min Final   GFR calc non Af Amer  Date Value Ref Range Status  06/07/2018 >60 >60 mL/min Final         Passed - Valid encounter within last 6 months    Recent Outpatient Visits          4 months ago Type 2 diabetes mellitus without complication, without long-term current use of insulin ([place])   [facility] at [place], [place] D, PA-C   6 months ago Type 2 diabetes mellitus without complication, without long-term current use of insulin ([place])   [facility] at [place], [place] D, PA-C   8 months ago 2-vessel coronary artery disease   [facility] at [place], [place] D, PA-C   10 months ago Type 2 diabetes mellitus without complication, without long-term current use of insulin [facility])   [facility] at [place], [place] D, PA-C   1 year ago Hypothyroidism, unspecified type   Primary Care at [facility], Reather Laurence, PA-C      Future Appointments            In 2 weeks Mcarthur Rossetti, MD [facility]

## 2018-07-31 NOTE — Telephone Encounter (Signed)
See note per [facility], RN

## 2018-08-04 NOTE — Telephone Encounter (Signed)
Pt need OV for refills

## 2018-08-09 ENCOUNTER — Ambulatory Visit (HOSPITAL_COMMUNITY)
Admission: EM | Admit: 2018-08-09 | Discharge: 2018-08-09 | Disposition: A | Discharge status: Home / Self Care | Payer: 59 | Attending: Internal Medicine | Admitting: Internal Medicine

## 2018-08-09 ENCOUNTER — Encounter (HOSPITAL_COMMUNITY): Payer: Self-pay | Admitting: Emergency Medicine

## 2018-08-09 DIAGNOSIS — R059 Cough, unspecified: Secondary | ICD-10-CM

## 2018-08-09 DIAGNOSIS — J0110 Acute frontal sinusitis, unspecified: Secondary | ICD-10-CM

## 2018-08-09 DIAGNOSIS — R05 Cough: Secondary | ICD-10-CM

## 2018-08-09 MED ORDER — AZITHROMYCIN 250 MG PO TABS: 250.0000 mg | ORAL_TABLET | Freq: Every day | ORAL | 0 refills | Status: DC

## 2018-08-09 NOTE — ED Triage Notes (Signed)
Pt c/o cough and body aches x 3 days
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
## 2018-08-09 NOTE — Discharge Instructions (Signed)
Recommend start Zithromax daily as directed. Continue to increase fluids to help loosen up mucus. Encouraged to decrease smoking. Follow-up with your PCP in 5 days if not improving.

## 2018-08-15 ENCOUNTER — Ambulatory Visit (INDEPENDENT_AMBULATORY_CARE_PROVIDER_SITE_OTHER): Payer: Medicare Other | Admitting: Orthopaedic Surgery

## 2018-08-21 ENCOUNTER — Ambulatory Visit (INDEPENDENT_AMBULATORY_CARE_PROVIDER_SITE_OTHER): Payer: 59 | Admitting: Family Medicine

## 2018-08-21 ENCOUNTER — Encounter: Payer: Self-pay | Admitting: Family Medicine

## 2018-08-21 ENCOUNTER — Other Ambulatory Visit: Payer: Self-pay

## 2018-08-21 VITALS — BP 100/65 | HR 69 | Temp 98.2°F | Resp 16 | Ht 73.0 in | Wt 259.6 lb

## 2018-08-21 DIAGNOSIS — I10 Essential (primary) hypertension: Secondary | ICD-10-CM

## 2018-08-21 DIAGNOSIS — E039 Hypothyroidism, unspecified: Secondary | ICD-10-CM | POA: Diagnosis not present

## 2018-08-21 DIAGNOSIS — E785 Hyperlipidemia, unspecified: Secondary | ICD-10-CM

## 2018-08-21 DIAGNOSIS — E119 Type 2 diabetes mellitus without complications: Secondary | ICD-10-CM | POA: Diagnosis not present

## 2018-08-21 DIAGNOSIS — Z1211 Encounter for screening for malignant neoplasm of colon: Secondary | ICD-10-CM

## 2018-08-21 DIAGNOSIS — I2510 Atherosclerotic heart disease of native coronary artery without angina pectoris: Secondary | ICD-10-CM | POA: Diagnosis not present

## 2018-08-21 LAB — POCT GLYCOSYLATED HEMOGLOBIN (HGB A1C): Hemoglobin A1C: 6.8 % — AB (ref 4.0–5.6)

## 2018-08-21 LAB — GLUCOSE, POCT (MANUAL RESULT ENTRY): POC Glucose: 148 mg/dl — AB (ref 70–99)

## 2018-08-21 MED ORDER — LEVOTHYROXINE SODIUM 125 MCG PO TABS: 125.0000 ug | ORAL_TABLET | Freq: Every day | ORAL | 1 refills | Status: DC

## 2018-08-21 MED ORDER — ATORVASTATIN CALCIUM 40 MG PO TABS: 40.0000 mg | ORAL_TABLET | Freq: Every day | ORAL | 1 refills | Status: DC

## 2018-08-21 MED ORDER — RAMIPRIL 10 MG PO CAPS: 10.0000 mg | ORAL_CAPSULE | Freq: Every day | ORAL | 1 refills | Status: DC

## 2018-08-21 MED ORDER — METFORMIN HCL ER 500 MG PO TB24: 1000.0000 mg | ORAL_TABLET | Freq: Two times a day (BID) | ORAL | 1 refills | Status: DC

## 2018-08-21 MED ORDER — DAPAGLIFLOZIN PROPANEDIOL 5 MG PO TABS: 5.0000 mg | ORAL_TABLET | Freq: Every day | ORAL | 1 refills | Status: DC

## 2018-08-21 MED ORDER — AMLODIPINE BESYLATE 10 MG PO TABS: 10.0000 mg | ORAL_TABLET | Freq: Every day | ORAL | 1 refills | Status: DC

## 2018-08-21 MED ORDER — CARVEDILOL 6.25 MG PO TABS: 6.2500 mg | ORAL_TABLET | Freq: Two times a day (BID) | ORAL | 1 refills | Status: DC

## 2018-08-21 NOTE — Progress Notes (Signed)
Subjective:    Patient ID: Colin Ramirez, male    DOB: Nov 15, 1965, 53 y.o.   MRN: [id number]  HPI Colin Ramirez is a 53 y.o. male Presents today for: Chief Complaint  Patient presents with  . Hypertension    Need refill on metformin  . Hyperlipidemia  . Diabetes   Diabetes:  Controlled in December by A1c. Admitted December 11 through December 12 for TIA, after left facial droop, head CT negative.  MRI/MRA negative for acute stroke, carotid Dopplers without significant disease, no thrombus seen on echo.  Neurology stroke team evaluated and do not think that he had a stroke or TIA and the twitching was likely related to prior history Bell's palsy.  Was continued on aspirin 81 mg, recommended decreased use of BC powder.  Microalbumin: 03/08/18: mi/creat ration 24.2 Takes Farxiga 5 mg daily, metformin 500 mg 2 tablets twice daily.  On ramipril for ACE,  Lipitor for statin. Optho, foot exam, pneumovax: Due for ophthalmologic exam some time last year - unknown date.   Lab Results  Component Value Date   HGBA1C 6.6 (H) 06/08/2018   HGBA1C 6.0 (A) 03/08/2018   HGBA1C 6.8 (H) 10/28/2017   Lab Results  Component Value Date   MICROALBUR 3.2 07/08/2015   LDLCALC 69 06/08/2018   CREATININE 0.90 06/07/2018  home readings - 108-110.    Hyperlipidemia:  Lab Results  Component Value Date   CHOL 132 06/08/2018   HDL 29 (L) 06/08/2018   LDLCALC 69 06/08/2018   TRIG 172 (H) 06/08/2018   CHOLHDL 4.6 06/08/2018   Lab Results  Component Value Date   ALT 68 (H) 06/07/2018   AST 46 (H) 06/07/2018   ALKPHOS 47 06/07/2018   BILITOT 0.6 06/07/2018  On Lipitor 40 mg daily, no new myalgias.   Hypertension: BP Readings from Last 3 Encounters:  08/21/18 100/65  08/09/18 112/70  06/08/18 (P) 125/81   Lab Results  Component Value Date   CREATININE 0.90 06/07/2018  HCTZ 25 mg daily, ramipril 10 mg daily, carvedilol 6.25 mg twice daily.  No new side effects with meds.    Hypothyroidism: Lab Results  Component Value Date   TSH 4.250 11/09/2017  Synthroid 125 mcg daily. Same dose for years. No new heart palpitations, temp, hair or skin changes.   Agrees to colonoscopy.   Review of Systems  Constitutional: Negative for fatigue and unexpected weight change.  Eyes: Negative for visual disturbance.  Respiratory: Negative for cough, chest tightness and shortness of breath.   Cardiovascular: Negative for chest pain, palpitations and leg swelling.  Gastrointestinal: Negative for abdominal pain and blood in stool.  Neurological: Negative for dizziness, light-headedness and headaches.   Per HPI. And     Objective:   Physical Exam Vitals signs reviewed.  Constitutional:      Appearance: He is well-developed.  HENT:     Head: Normocephalic and atraumatic.  Eyes:     Pupils: Pupils are equal, round, and reactive to light.  Neck:     Vascular: No carotid bruit or JVD.  Cardiovascular:     Rate and Rhythm: Normal rate and regular rhythm.     Heart sounds: Normal heart sounds. No murmur.  Pulmonary:     Effort: Pulmonary effort is normal.     Breath sounds: Normal breath sounds. No rales.  Skin:    General: Skin is warm and dry.  Neurological:     Mental Status: He is alert and oriented to person, place,  and time.     Results for orders placed or performed in visit on 08/21/18  POCT glycosylated hemoglobin (Hb A1C)  Result Value Ref Range   Hemoglobin A1C 6.8 (A) 4.0 - 5.6 %   HbA1c POC (<> result, manual entry)     HbA1c, POC (prediabetic range)     HbA1c, POC (controlled diabetic range)    POCT glucose (manual entry)  Result Value Ref Range   POC Glucose 148 (A) 70 - 99 mg/dl       Assessment & Plan:   [name] is a 53 y.o. male Essential hypertension - Plan: Comprehensive metabolic panel, amLODipine (NORVASC) 10 MG tablet, ramipril (ALTACE) 10 MG capsule  -Stable.  Tolerating current regimen, refilled, labs pending  Type 2  diabetes mellitus without complication, without long-term current use of insulin ([place]) - Plan: POCT glycosylated hemoglobin (Hb A1C), POCT glucose (manual entry), dapagliflozin propanediol (FARXIGA) 5 MG TABS tablet, metFORMIN (GLUCOPHAGE-XR) 500 MG 24 hr tablet, DISCONTINUED: metFORMIN (GLUCOPHAGE-XR) 500 MG 24 hr tablet  -Stable with metformin, farxiga.  No changes at this time.  Hyperlipidemia, unspecified hyperlipidemia type - Plan: Lipid Panel, atorvastatin (LIPITOR) 40 MG tablet 2-vessel coronary artery disease - Plan: carvedilol (COREG) 6.25 MG tablet  -Tolerating statin, continue same, labs pending.  Continue Coreg.  Special screening for malignant neoplasms, colon - Plan: Ambulatory referral to Gastroenterology  -Agrees to referral for colonoscopy.  Hypothyroidism, unspecified type - Plan: TSH, levothyroxine (SYNTHROID) 125 MCG tablet  -Check TSH, continue Synthroid same dose for now.  Meds ordered this encounter  Medications  . DISCONTD: metFORMIN (GLUCOPHAGE-XR) 500 MG 24 hr tablet    Sig: Take 2 tablets (1,000 mg total) by mouth 2 (two) times daily with a meal.    Dispense:  90 tablet    Refill:  1  . amLODipine (NORVASC) 10 MG tablet    Sig: Take 1 tablet (10 mg total) by mouth daily.    Dispense:  90 tablet    Refill:  1  . atorvastatin (LIPITOR) 40 MG tablet    Sig: Take 1 tablet (40 mg total) by mouth daily.    Dispense:  90 tablet    Refill:  1  . carvedilol (COREG) 6.25 MG tablet    Sig: Take 1 tablet (6.25 mg total) by mouth 2 (two) times daily.    Dispense:  180 tablet    Refill:  1  . dapagliflozin propanediol (FARXIGA) 5 MG TABS tablet    Sig: Take 5 mg by mouth daily.    Dispense:  90 tablet    Refill:  1  . metFORMIN (GLUCOPHAGE-XR) 500 MG 24 hr tablet    Sig: Take 2 tablets (1,000 mg total) by mouth 2 (two) times daily with a meal.    Dispense:  360 tablet    Refill:  1  . ramipril (ALTACE) 10 MG capsule    Sig: Take 1 capsule (10 mg total) by mouth  daily.    Dispense:  90 capsule    Refill:  1  . levothyroxine (SYNTHROID) 125 MCG tablet    Sig: Take 1 tablet (125 mcg total) by mouth daily.    Dispense:  90 tablet    Refill:  1   Patient Instructions       If you have lab work done today you will be contacted with your lab results within the next 2 weeks.  If you have not heard from [place] then please contact us. The fastest way to  get your results is to register for My Chart.   IF you received an x-ray today, you will receive an invoice from [facility]. Please contact [facility] at [phone] with questions or concerns regarding your invoice.   IF you received labwork today, you will receive an invoice from [place]. Please contact [facility] at [phone] with questions or concerns regarding your invoice.   Our billing staff will not be able to assist you with questions regarding bills from these companies.  You will be contacted with the lab results as soon as they are available. The fastest way to get your results is to activate your My Chart account. Instructions are located on the last page of this paperwork. If you have not heard from [place] regarding the results in 2 weeks, please contact this office.      . Signed,   Merri Ray, MD [facility] [place].  08/22/18 10:47 PM

## 2018-08-21 NOTE — Patient Instructions (Signed)
° ° ° °  If you have lab work done today you will be contacted with your lab results within the next 2 weeks.  If you have not heard from us then please contact us. The fastest way to get your results is to register for My Chart. ° ° °IF you received an x-ray today, you will receive an invoice from [facility]. Please contact [facility] at [phone] with questions or concerns regarding your invoice.  ° °IF you received labwork today, you will receive an invoice from [facility]. Please contact [facility] at [phone] with questions or concerns regarding your invoice.  ° °Our billing staff will not be able to assist you with questions regarding bills from these companies. ° °You will be contacted with the lab results as soon as they are available. The fastest way to get your results is to activate your My Chart account. Instructions are located on the last page of this paperwork. If you have not heard from us regarding the results in 2 weeks, please contact this office. °  ° ° ° °

## 2018-08-22 ENCOUNTER — Encounter: Payer: Self-pay | Admitting: Family Medicine

## 2018-08-22 LAB — COMPREHENSIVE METABOLIC PANEL
ALT: 44 IU/L (ref 0–44)
AST: 30 IU/L (ref 0–40)
Albumin/Globulin Ratio: 1.8 (ref 1.2–2.2)
Albumin: 4.5 g/dL (ref 3.8–4.9)
Alkaline Phosphatase: 53 IU/L (ref 39–117)
BUN/Creatinine Ratio: 10 (ref 9–20)
BUN: 8 mg/dL (ref 6–24)
Bilirubin Total: 0.3 mg/dL (ref 0.0–1.2)
CO2: 20 mmol/L (ref 20–29)
Calcium: 9.7 mg/dL (ref 8.7–10.2)
Chloride: 101 mmol/L (ref 96–106)
Creatinine, Ser: 0.81 mg/dL (ref 0.76–1.27)
GFR calc Af Amer: 116 mL/min/{1.73_m2} (ref >59)
GFR calc non Af Amer: 101 mL/min/{1.73_m2} (ref >59)
Globulin, Total: 2.5 g/dL (ref 1.5–4.5)
Glucose: 145 mg/dL — ABNORMAL HIGH (ref 65–99)
Potassium: 3.6 mmol/L (ref 3.5–5.2)
Sodium: 142 mmol/L (ref 134–144)
Total Protein: 7 g/dL (ref 6.0–8.5)

## 2018-08-22 LAB — LIPID PANEL
Chol/HDL Ratio: 4.4 ratio (ref 0.0–5.0)
Cholesterol, Total: 158 mg/dL (ref 100–199)
HDL: 36 mg/dL — ABNORMAL LOW (ref >39)
LDL Calculated: 80 mg/dL (ref 0–99)
Triglycerides: 209 mg/dL — ABNORMAL HIGH (ref 0–149)
VLDL CHOLESTEROL CAL: 42 mg/dL — AB (ref 5–40)

## 2018-08-22 LAB — TSH: TSH: 4.16 u[IU]/mL (ref 0.450–4.500)

## 2018-09-04 ENCOUNTER — Encounter: Payer: Self-pay | Admitting: Radiology

## 2018-09-23 ENCOUNTER — Other Ambulatory Visit: Payer: Self-pay | Admitting: Physician Assistant

## 2018-09-23 DIAGNOSIS — Z8719 Personal history of other diseases of the digestive system: Secondary | ICD-10-CM

## 2018-09-23 DIAGNOSIS — K219 Gastro-esophageal reflux disease without esophagitis: Secondary | ICD-10-CM

## 2018-11-08 ENCOUNTER — Other Ambulatory Visit: Payer: Self-pay

## 2018-11-08 ENCOUNTER — Ambulatory Visit (INDEPENDENT_AMBULATORY_CARE_PROVIDER_SITE_OTHER): Payer: 59 | Admitting: Family Medicine

## 2018-11-08 VITALS — BP 140/81 | Ht 73.0 in | Wt 259.0 lb

## 2018-11-08 DIAGNOSIS — Z0000 Encounter for general adult medical examination without abnormal findings: Secondary | ICD-10-CM | POA: Diagnosis not present

## 2018-11-08 NOTE — Patient Instructions (Signed)
Advance Directive  Advance directives are legal documents that let you make choices ahead of time about your health care and medical treatment in case you become unable to communicate for yourself. Advance directives are a way for you to communicate your wishes to family, friends, and health care providers. This can help convey your decisions about end-of-life care if you become unable to communicate. Discussing and writing advance directives should happen over time rather than all at once. Advance directives can be changed depending on your situation and what you want, even after you have signed the advance directives. If you do not have an advance directive, some states assign family decision makers to act on your behalf based on how closely you are related to them. Each state has its own laws regarding advance directives. You may want to check with your health care provider, attorney, or state representative about the laws in your state. There are different types of advance directives, such as:  Medical power of attorney.  Living will.  Do not resuscitate (DNR) or do not attempt resuscitation (DNAR) order. Health care proxy and medical power of attorney A health care proxy, also called a health care agent, is a person who is appointed to make medical decisions for you in cases in which you are unable to make the decisions yourself. Generally, people choose someone they know well and trust to represent their preferences. Make sure to ask this person for an agreement to act as your proxy. A proxy may have to exercise judgment in the event of a medical decision for which your wishes are not known. A medical power of attorney is a legal document that names your health care proxy. Depending on the laws in your state, after the document is written, it may also need to be:  Signed.  Notarized.  Dated.  Copied.  Witnessed.  Incorporated into your medical record. You may also want to appoint  someone to manage your financial affairs in a situation in which you are unable to do so. This is called a durable power of attorney for finances. It is a separate legal document from the durable power of attorney for health care. You may choose the same person or someone different from your health care proxy to act as your agent in financial matters. If you do not appoint a proxy, or if there is a concern that the proxy is not acting in your best interests, a court-appointed guardian may be designated to act on your behalf. Living will A living will is a set of instructions documenting your wishes about medical care when you cannot express them yourself. Health care providers should keep a copy of your living will in your medical record. You may want to give a copy to family members or friends. To alert caregivers in case of an emergency, you can place a card in your wallet to let them know that you have a living will and where they can find it. A living will is used if you become:  Terminally ill.  Incapacitated.  Unable to communicate or make decisions. Items to consider in your living will include:  The use or non-use of life-sustaining equipment, such as dialysis machines and breathing machines (ventilators).  A DNR or DNAR order, which is the instruction not to use cardiopulmonary resuscitation (CPR) if breathing or heartbeat stops.  The use or non-use of tube feeding.  Withholding of food and fluids.  Comfort (palliative) care when the goal becomes comfort rather  than a cure.  Organ and tissue donation. A living will does not give instructions for distributing your money and property if you should pass away. It is recommended that you seek the advice of a lawyer when writing a will. Decisions about taxes, beneficiaries, and asset distribution will be legally binding. This process can relieve your family and friends of any concerns surrounding disputes or questions that may come up about  the distribution of your assets. DNR or DNAR A DNR or DNAR order is a request not to have CPR in the event that your heart stops beating or you stop breathing. If a DNR or DNAR order has not been made and shared, a health care provider will try to help any patient whose heart has stopped or who has stopped breathing. If you plan to have surgery, talk with your health care provider about how your DNR or DNAR order will be followed if problems occur. Summary  Advance directives are the legal documents that allow you to make choices ahead of time about your health care and medical treatment in case you become unable to communicate for yourself.  The process of discussing and writing advance directives should happen over time. You can change the advance directives, even after you have signed them.  Advance directives include DNR or DNAR orders, living wills, and designating an agent as your medical power of attorney. This information is not intended to replace advice given to you by your health care provider. Make sure you discuss any questions you have with your health care provider. Document Released: 09/21/2007 Document Revised: 05/03/2016 Document Reviewed: 05/03/2016 Elsevier Interactive Patient Education  2019 [place] Following a healthy eating pattern may help you to achieve and maintain a healthy body weight, reduce the risk of chronic disease, and live a long and productive life. It is important to follow a healthy eating pattern at an appropriate calorie level for your body. Your nutritional needs should be met primarily through food by choosing a variety of nutrient-rich foods. What are tips for following this plan? Reading food labels  Read labels and choose the following: ? Reduced or low sodium. ? Juices with 100% fruit juice. ? Foods with low saturated fats and high polyunsaturated and monounsaturated fats. ? Foods with whole grains, such as whole wheat, cracked  wheat, brown rice, and wild rice. ? Whole grains that are fortified with folic acid. This is recommended for women who are pregnant or who want to become pregnant.  Read labels and avoid the following: ? Foods with a lot of added sugars. These include foods that contain brown sugar, corn sweetener, corn syrup, dextrose, fructose, glucose, high-fructose corn syrup, honey, invert sugar, lactose, malt syrup, maltose, molasses, raw sugar, sucrose, trehalose, or turbinado sugar.  Do not eat more than the following amounts of added sugar per day:  6 teaspoons (25 g) for women.  9 teaspoons (38 g) for men. ? Foods that contain processed or refined starches and grains. ? Refined grain products, such as white flour, degermed cornmeal, white bread, and white rice. Shopping  Choose nutrient-rich snacks, such as vegetables, whole fruits, and nuts. Avoid high-calorie and high-sugar snacks, such as potato chips, fruit snacks, and candy.  Use oil-based dressings and spreads on foods instead of solid fats such as butter, stick margarine, or cream cheese.  Limit pre-made sauces, mixes, and "instant" products such as flavored rice, instant noodles, and ready-made pasta.  Try more plant-protein sources, such as tofu, tempeh,  black beans, edamame, lentils, nuts, and seeds.  Explore eating plans such as the Mediterranean diet or vegetarian diet. Cooking  Use oil to saut or stir-fry foods instead of solid fats such as butter, stick margarine, or lard.  Try baking, boiling, grilling, or broiling instead of frying.  Remove the fatty part of meats before cooking.  Steam vegetables in water or broth. Meal planning   At meals, imagine dividing your plate into fourths: ? One-half of your plate is fruits and vegetables. ? One-fourth of your plate is whole grains. ? One-fourth of your plate is protein, especially lean meats, poultry, eggs, tofu, beans, or nuts.  Include low-fat dairy as part of your  daily diet. Lifestyle  Choose healthy options in all settings, including home, work, school, restaurants, or stores.  Prepare your food safely: ? Wash your hands after handling raw meats. ? Keep food preparation surfaces clean by regularly washing with hot, soapy water. ? Keep raw meats separate from ready-to-eat foods, such as fruits and vegetables. ? Cook seafood, meat, poultry, and eggs to the recommended internal temperature. ? Store foods at safe temperatures. In general:  Keep cold foods at 68F (4.4C) or below.  Keep hot foods at 168F (60C) or above.  Keep your freezer at [facility] (-17.8C) or below.  Foods are no longer safe to eat when they have been between the temperatures of 40-168F (4.4-60C) for more than 2 hours. What foods should I eat? Fruits Aim to eat 2 cup-equivalents of fresh, canned (in natural juice), or frozen fruits each day. Examples of 1 cup-equivalent of fruit include 1 small apple, 8 large strawberries, 1 cup canned fruit,  cup dried fruit, or 1 cup 100% juice. Vegetables Aim to eat 2-3 cup-equivalents of fresh and frozen vegetables each day, including different varieties and colors. Examples of 1 cup-equivalent of vegetables include 2 medium carrots, 2 cups raw, leafy greens, 1 cup chopped vegetable (raw or cooked), or 1 medium baked potato. Grains Aim to eat 6 ounce-equivalents of whole grains each day. Examples of 1 ounce-equivalent of grains include 1 slice of bread, 1 cup ready-to-eat cereal, 3 cups popcorn, or  cup cooked rice, pasta, or cereal. Meats and other proteins Aim to eat 5-6 ounce-equivalents of protein each day. Examples of 1 ounce-equivalent of protein include 1 egg, 1/2 cup nuts or seeds, or 1 tablespoon (16 g) peanut butter. A cut of meat or fish that is the size of a deck of cards is about 3-4 ounce-equivalents.  Of the protein you eat each week, try to have at least 8 ounces come from seafood. This includes salmon, trout, herring, and  anchovies. Dairy Aim to eat 3 cup-equivalents of fat-free or low-fat dairy each day. Examples of 1 cup-equivalent of dairy include 1 cup (240 mL) milk, 8 ounces (250 g) yogurt, 1 ounces (44 g) natural cheese, or 1 cup (240 mL) fortified soy milk. Fats and oils  Aim for about 5 teaspoons (21 g) per day. Choose monounsaturated fats, such as canola and olive oils, avocados, peanut butter, and most nuts, or polyunsaturated fats, such as sunflower, corn, and soybean oils, walnuts, pine nuts, sesame seeds, sunflower seeds, and flaxseed. Beverages  Aim for six 8-oz glasses of water per day. Limit coffee to three to five 8-oz cups per day.  Limit caffeinated beverages that have added calories, such as soda and energy drinks.  Limit alcohol intake to no more than 1 drink a day for nonpregnant women and 2 drinks a day  for men. One drink equals 12 oz of beer (355 mL), 5 oz of wine (148 mL), or 1 oz of hard liquor (44 mL). Seasoning and other foods  Avoid adding excess amounts of salt to your foods. Try flavoring foods with herbs and spices instead of salt.  Avoid adding sugar to foods.  Try using oil-based dressings, sauces, and spreads instead of solid fats. This information is based on general [place] nutrition guidelines. For more information, visit choosemyplate.gov. Exact amounts may vary based on your nutrition needs. Summary  A healthy eating plan may help you to maintain a healthy weight, reduce the risk of chronic diseases, and stay active throughout your life.  Plan your meals. Make sure you eat the right portions of a variety of nutrient-rich foods.  Try baking, boiling, grilling, or broiling instead of frying.  Choose healthy options in all settings, including home, work, school, restaurants, or stores. This information is not intended to replace advice given to you by your health care provider. Make sure you discuss any questions you have with your health care provider. Document  Released: 09/26/2017 Document Revised: 09/26/2017 Document Reviewed: 09/26/2017 Elsevier Interactive Patient Education  2019 Elsevier Inc.  
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
## 2018-11-13 ENCOUNTER — Other Ambulatory Visit: Payer: Self-pay | Admitting: Family Medicine

## 2018-11-13 DIAGNOSIS — E119 Type 2 diabetes mellitus without complications: Secondary | ICD-10-CM
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
## 2018-11-22 ENCOUNTER — Other Ambulatory Visit: Payer: Self-pay

## 2018-11-22 ENCOUNTER — Telehealth (INDEPENDENT_AMBULATORY_CARE_PROVIDER_SITE_OTHER): Payer: 59 | Admitting: Family Medicine

## 2018-11-22 DIAGNOSIS — E119 Type 2 diabetes mellitus without complications: Secondary | ICD-10-CM | POA: Diagnosis not present

## 2018-11-22 DIAGNOSIS — I10 Essential (primary) hypertension: Secondary | ICD-10-CM

## 2018-11-22 DIAGNOSIS — E039 Hypothyroidism, unspecified: Secondary | ICD-10-CM

## 2018-11-22 DIAGNOSIS — E785 Hyperlipidemia, unspecified: Secondary | ICD-10-CM

## 2018-11-22 DIAGNOSIS — I2510 Atherosclerotic heart disease of native coronary artery without angina pectoris: Secondary | ICD-10-CM

## 2018-11-22 MED ORDER — DAPAGLIFLOZIN PROPANEDIOL 5 MG PO TABS: 5.0000 mg | ORAL_TABLET | Freq: Every day | ORAL | 1 refills | Status: DC
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
## 2018-11-22 NOTE — Progress Notes (Signed)
No medical concern or questions. Only needing refill on the farxiga. Does not monitor bp or cbg on a daily basis. Last reading was last week at 140/87 and 116 ac, weighs 257lb

## 2018-11-28 ENCOUNTER — Ambulatory Visit: Payer: Medicare Other

## 2018-12-21 ENCOUNTER — Other Ambulatory Visit: Payer: Self-pay | Admitting: Family Medicine

## 2018-12-21 DIAGNOSIS — K219 Gastro-esophageal reflux disease without esophagitis: Secondary | ICD-10-CM

## 2018-12-21 DIAGNOSIS — Z8719 Personal history of other diseases of the digestive system: Secondary | ICD-10-CM

## 2018-12-24 ENCOUNTER — Other Ambulatory Visit: Payer: Self-pay | Admitting: Physician Assistant

## 2019-01-12 ENCOUNTER — Other Ambulatory Visit: Payer: Self-pay | Admitting: Physician Assistant

## 2019-01-12 DIAGNOSIS — I10 Essential (primary) hypertension: Secondary | ICD-10-CM

## 2019-01-12 NOTE — Telephone Encounter (Signed)
Requested Prescriptions  Pending Prescriptions Disp Refills  . hydrochlorothiazide (HYDRODIURIL) 25 MG tablet [Pharmacy Med Name: HYDROCHLOROTHIAZIDE 25 MG TAB] 90 tablet 0    Sig: TAKE 1 TABLET BY MOUTH EVERY DAY     Cardiovascular: Diuretics - Thiazide Failed - 01/12/2019 11:25 AM      Failed - Last BP in normal range    BP Readings from Last 1 Encounters:  11/08/18 140/81         Passed - Ca in normal range and within 360 days    Calcium  Date Value Ref Range Status  08/21/2018 9.7 8.7 - 10.2 mg/dL Final         Passed - Cr in normal range and within 360 days    Creat  Date Value Ref Range Status  04/01/2016 0.75 0.70 - 1.33 mg/dL Final    Comment:      For patients > or = 55 years of age: The upper reference limit for Creatinine is approximately 13% higher for people identified as African-American.      Creatinine, Ser  Date Value Ref Range Status  08/21/2018 0.81 0.76 - 1.27 mg/dL Final         Passed - K in normal range and within 360 days    Potassium  Date Value Ref Range Status  08/21/2018 3.6 3.5 - 5.2 mmol/L Final         Passed - Na in normal range and within 360 days    Sodium  Date Value Ref Range Status  08/21/2018 142 134 - 144 mmol/L Final         Passed - Valid encounter within last 6 months    Recent Outpatient Visits          2 months ago Medicare annual wellness visit, subsequent   [facility], Lilia Argue, MD   4 months ago Essential hypertension   [facility] at [place], MD   10 months ago Type 2 diabetes mellitus without complication, without long-term current use of insulin [facility])   Primary Care at [place], [place] D, PA-C   1 year ago Type 2 diabetes mellitus without complication, without long-term current use of insulin [facility])   Primary Care at [place], [place] D, PA-C   1 year ago 2-vessel coronary artery disease   Primary Care at [facility], Reather Laurence, PA-C      Future  Appointments            In 1 month Pamella Pert, Lilia Argue, MD Primary Care at [place], [facility]

## 2019-01-17 ENCOUNTER — Other Ambulatory Visit: Payer: Self-pay

## 2019-01-17 DIAGNOSIS — Z20822 Contact with and (suspected) exposure to covid-19: Secondary | ICD-10-CM

## 2019-01-21 LAB — NOVEL CORONAVIRUS, NAA: SARS-CoV-2, NAA: NOT DETECTED

## 2019-01-23 ENCOUNTER — Other Ambulatory Visit: Payer: Self-pay | Admitting: Physician Assistant

## 2019-02-03 ENCOUNTER — Other Ambulatory Visit: Payer: Self-pay | Admitting: Physician Assistant

## 2019-02-22 ENCOUNTER — Ambulatory Visit: Payer: Medicare Other | Admitting: Family Medicine

## 2019-02-23 ENCOUNTER — Encounter: Payer: Self-pay | Admitting: Family Medicine

## 2019-02-25 ENCOUNTER — Other Ambulatory Visit: Payer: Self-pay | Admitting: Family Medicine

## 2019-02-25 DIAGNOSIS — E119 Type 2 diabetes mellitus without complications: Secondary | ICD-10-CM

## 2019-03-25 ENCOUNTER — Other Ambulatory Visit: Payer: Self-pay | Admitting: Family Medicine

## 2019-03-25 DIAGNOSIS — K219 Gastro-esophageal reflux disease without esophagitis: Secondary | ICD-10-CM

## 2019-03-25 DIAGNOSIS — Z8719 Personal history of other diseases of the digestive system: Secondary | ICD-10-CM

## 2019-03-28 ENCOUNTER — Other Ambulatory Visit: Payer: Self-pay | Admitting: Family Medicine

## 2019-03-28 DIAGNOSIS — E039 Hypothyroidism, unspecified: Secondary | ICD-10-CM

## 2019-03-28 NOTE — Telephone Encounter (Signed)
Requested medication (s) are due for refill today: yes  Requested medication (s) are on the active medication list: yes  Last refill:  12/24/2018  Future visit scheduled: no  Notes to clinic:  Review for refill   Requested Prescriptions  Pending Prescriptions Disp Refills   SYNTHROID 125 MCG tablet [Pharmacy Med Name: SYNTHROID 125 MCG TABLET] 90 tablet 1    Sig: TAKE 1 [place]     Endocrinology:  Hypothyroid Agents Failed - 03/28/2019  1:40 AM      Failed - TSH needs to be rechecked within 3 months after an abnormal result. Refill until TSH is due.      Passed - TSH in normal range and within 360 days    TSH  Date Value Ref Range Status  08/21/2018 4.160 0.450 - 4.500 uIU/mL Final         Passed - Valid encounter within last 12 months    Recent Outpatient Visits          4 months ago Type 2 diabetes mellitus without complication, without long-term current use of insulin [facility])   [facility], Lilia Argue, MD   4 months ago Medicare annual wellness visit, subsequent   [facility], Lilia Argue, MD   7 months ago Essential hypertension   [facility], Ranell Patrick, MD   1 year ago Type 2 diabetes mellitus without complication, without long-term current use of insulin [facility])   Primary Care at [place], [place] D, PA-C   1 year ago Type 2 diabetes mellitus without complication, without long-term current use of insulin [facility])   Primary Care at [facility], [place] D, PA-C

## 2019-04-06 ENCOUNTER — Other Ambulatory Visit: Payer: Self-pay | Admitting: Physician Assistant

## 2019-04-06 DIAGNOSIS — I10 Essential (primary) hypertension: Secondary | ICD-10-CM

## 2019-04-06 NOTE — Telephone Encounter (Signed)
Forwarding medication refill request to the clinical pool for review. 

## 2019-04-08 ENCOUNTER — Other Ambulatory Visit: Payer: Self-pay | Admitting: Family Medicine

## 2019-04-08 DIAGNOSIS — E785 Hyperlipidemia, unspecified: Secondary | ICD-10-CM

## 2019-04-08 DIAGNOSIS — I10 Essential (primary) hypertension: Secondary | ICD-10-CM

## 2019-04-09 NOTE — Telephone Encounter (Signed)
Requested medication (s) are due for refill today: yes  Requested medication (s) are on the active medication list: yes  Last refill:  01/06/2019  Future visit scheduled: no  Notes to clinic:  Overdue for office visit Review for refill   Requested Prescriptions  Pending Prescriptions Disp Refills   atorvastatin (LIPITOR) 40 MG tablet [Pharmacy Med Name: ATORVASTATIN 40 MG TABLET] 90 tablet 1    Sig: TAKE 1 TABLET BY MOUTH EVERY DAY     Cardiovascular:  Antilipid - Statins Failed - 04/08/2019 10:09 AM      Failed - HDL in normal range and within 360 days    HDL  Date Value Ref Range Status  08/21/2018 36 (L) >39 mg/dL Final         Failed - Triglycerides in normal range and within 360 days    Triglycerides  Date Value Ref Range Status  08/21/2018 209 (H) 0 - 149 mg/dL Final         Passed - Total Cholesterol in normal range and within 360 days    Cholesterol, Total  Date Value Ref Range Status  08/21/2018 158 100 - 199 mg/dL Final         Passed - LDL in normal range and within 360 days    LDL Calculated  Date Value Ref Range Status  08/21/2018 80 0 - 99 mg/dL Final         Passed - Patient is not pregnant      Passed - Valid encounter within last 12 months    Recent Outpatient Visits          4 months ago Type 2 diabetes mellitus without complication, without long-term current use of insulin ([place])   [facility], Lilia Argue, MD   5 months ago Medicare annual wellness visit, subsequent   [facility], Lilia Argue, MD   7 months ago Essential hypertension   [facility], Ranell Patrick, MD   1 year ago Type 2 diabetes mellitus without complication, without long-term current use of insulin ([place])   Primary Care at [place], [place] D, PA-C   1 year ago Type 2 diabetes mellitus without complication, without long-term current use of insulin ([place])   Primary Care at [place], [place] D, PA-C              amLODipine (NORVASC) 10 MG tablet [Pharmacy Med Name: AMLODIPINE BESYLATE 10 MG TAB] 90 tablet 1    Sig: TAKE 1 TABLET BY MOUTH EVERY DAY     Cardiovascular:  Calcium Channel Blockers Failed - 04/08/2019 10:09 AM      Failed - Last BP in normal range    BP Readings from Last 1 Encounters:  11/08/18 140/81         Passed - Valid encounter within last 6 months    Recent Outpatient Visits          4 months ago Type 2 diabetes mellitus without complication, without long-term current use of insulin ([place])   [facility], Lilia Argue, MD   5 months ago Medicare annual wellness visit, subsequent   [facility], Lilia Argue, MD   7 months ago Essential hypertension   [facility], Ranell Patrick, MD   1 year ago Type 2 diabetes mellitus without complication, without long-term current use of insulin [facility])   Primary Care at [facility], Tanzania D, [place]  1 year ago Type 2 diabetes mellitus without complication, without long-term current use of insulin [facility])   Primary Care at [facility], [place] D, [place]

## 2019-04-15 ENCOUNTER — Other Ambulatory Visit: Payer: Self-pay | Admitting: Physician Assistant

## 2019-04-15 DIAGNOSIS — I10 Essential (primary) hypertension: Secondary | ICD-10-CM

## 2019-04-22 ENCOUNTER — Other Ambulatory Visit: Payer: Self-pay | Admitting: Family Medicine

## 2019-04-22 DIAGNOSIS — E039 Hypothyroidism, unspecified: Secondary | ICD-10-CM

## 2019-04-23 ENCOUNTER — Other Ambulatory Visit: Payer: Self-pay | Admitting: Family Medicine

## 2019-04-23 DIAGNOSIS — I10 Essential (primary) hypertension: Secondary | ICD-10-CM

## 2019-05-05 ENCOUNTER — Other Ambulatory Visit: Payer: Self-pay | Admitting: Physician Assistant

## 2019-05-05 DIAGNOSIS — I10 Essential (primary) hypertension: Secondary | ICD-10-CM

## 2019-05-05 NOTE — Telephone Encounter (Signed)
Forwarding medication refill request to the clinical pool for review. 

## 2019-05-11 ENCOUNTER — Telehealth: Payer: Self-pay | Admitting: Emergency Medicine

## 2019-05-11 ENCOUNTER — Ambulatory Visit
Admission: EM | Admit: 2019-05-11 | Discharge: 2019-05-11 | Disposition: A | Discharge status: Home / Self Care | Payer: 59 | Attending: Emergency Medicine | Admitting: Emergency Medicine

## 2019-05-11 ENCOUNTER — Encounter: Payer: Self-pay | Admitting: Emergency Medicine

## 2019-05-11 ENCOUNTER — Other Ambulatory Visit: Payer: Self-pay

## 2019-05-11 DIAGNOSIS — J209 Acute bronchitis, unspecified: Secondary | ICD-10-CM

## 2019-05-11 DIAGNOSIS — I10 Essential (primary) hypertension: Secondary | ICD-10-CM | POA: Diagnosis not present

## 2019-05-11 DIAGNOSIS — R059 Cough, unspecified: Secondary | ICD-10-CM

## 2019-05-11 DIAGNOSIS — F17210 Nicotine dependence, cigarettes, uncomplicated: Secondary | ICD-10-CM

## 2019-05-11 DIAGNOSIS — R05 Cough: Secondary | ICD-10-CM

## 2019-05-11 DIAGNOSIS — Z20828 Contact with and (suspected) exposure to other viral communicable diseases: Secondary | ICD-10-CM | POA: Diagnosis not present

## 2019-05-11 MED ORDER — LORATADINE 10 MG PO TABS: 10.0000 mg | ORAL_TABLET | Freq: Every day | ORAL | 0 refills | Status: DC

## 2019-05-11 MED ORDER — ALBUTEROL SULFATE (2.5 MG/3ML) 0.083% IN NEBU: 2.5000 mg | INHALATION_SOLUTION | Freq: Four times a day (QID) | RESPIRATORY_TRACT | 12 refills | Status: DC | PRN

## 2019-05-11 MED ORDER — ALBUTEROL SULFATE HFA 108 (90 BASE) MCG/ACT IN AERS: 2.0000 | INHALATION_SPRAY | RESPIRATORY_TRACT | 0 refills | Status: DC | PRN

## 2019-05-11 MED ORDER — AZITHROMYCIN 250 MG PO TABS: 250.0000 mg | ORAL_TABLET | Freq: Every day | ORAL | 0 refills | Status: DC

## 2019-05-11 MED ORDER — FLUTICASONE PROPIONATE 50 MCG/ACT NA SUSP: 1.0000 | Freq: Every day | NASAL | 0 refills | Status: DC

## 2019-05-11 NOTE — Telephone Encounter (Signed)
Due to medication mix up and pharmacy not being able to accept returns at this time due to [place], patient will come to office to pick up albuterol inhaler out of our supply.  Okay'd by leadership.

## 2019-05-11 NOTE — Discharge Instructions (Addendum)
Your COVID test is pending - it is important to quarantine / isolate at home until your results are back. °If you test positive and would like further evaluation for persistent or worsening symptoms, you may schedule an E-visit or virtual (video) visit throughout the [facility] MyChart app or website. ° °PLEASE NOTE: If you develop severe chest pain or shortness of breath please go to the ER or call 9-1-1 for further evaluation --> DO NOT schedule electronic or virtual visits for this. °Please call our office for further guidance / recommendations as needed. °

## 2019-05-11 NOTE — ED Triage Notes (Signed)
Pt presents to [facility] for assessment of cough, nasal congestion, and "chest congestion" starting 2 days ago.  Denies fevers at home.  C/o SOB sitting still this morning.
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
## 2019-05-11 NOTE — Telephone Encounter (Signed)
Patient's wife calling regarding medication error.  Albuterol nebulizer solution sent instead of MDI.  Correct prescription sent.

## 2019-05-11 NOTE — ED Notes (Signed)
Patient able to ambulate independently  

## 2019-05-14 LAB — NOVEL CORONAVIRUS, NAA: SARS-CoV-2, NAA: NOT DETECTED

## 2019-05-18 ENCOUNTER — Other Ambulatory Visit: Payer: Self-pay | Admitting: Family Medicine

## 2019-05-18 DIAGNOSIS — E039 Hypothyroidism, unspecified: Secondary | ICD-10-CM

## 2019-05-18 NOTE — Telephone Encounter (Signed)
Refilled medication for 30 days. 0 refills. Patient needs OV for additional refills.

## 2019-05-18 NOTE — Telephone Encounter (Signed)
Forwarding medication refill request to the clinical pool for review. 

## 2019-05-29 ENCOUNTER — Telehealth: Payer: Self-pay

## 2019-05-29 NOTE — Telephone Encounter (Signed)
Called patient to do their pre-visit COVID screening.  Call went to voicemail. Unable to do prescreening.  

## 2019-05-30 ENCOUNTER — Other Ambulatory Visit: Payer: Self-pay

## 2019-05-30 ENCOUNTER — Ambulatory Visit (INDEPENDENT_AMBULATORY_CARE_PROVIDER_SITE_OTHER): Payer: 59 | Admitting: Nurse Practitioner

## 2019-05-30 VITALS — BP 166/117 | HR 72 | Temp 97.5°F | Resp 18 | Ht 73.0 in | Wt 263.0 lb

## 2019-05-30 DIAGNOSIS — Z1211 Encounter for screening for malignant neoplasm of colon: Secondary | ICD-10-CM

## 2019-05-30 DIAGNOSIS — E785 Hyperlipidemia, unspecified: Secondary | ICD-10-CM | POA: Diagnosis not present

## 2019-05-30 DIAGNOSIS — E039 Hypothyroidism, unspecified: Secondary | ICD-10-CM | POA: Diagnosis not present

## 2019-05-30 DIAGNOSIS — E669 Obesity, unspecified: Secondary | ICD-10-CM

## 2019-05-30 DIAGNOSIS — I10 Essential (primary) hypertension: Secondary | ICD-10-CM | POA: Diagnosis not present

## 2019-05-30 DIAGNOSIS — E119 Type 2 diabetes mellitus without complications: Secondary | ICD-10-CM

## 2019-05-30 DIAGNOSIS — K219 Gastro-esophageal reflux disease without esophagitis: Secondary | ICD-10-CM

## 2019-05-30 DIAGNOSIS — F17200 Nicotine dependence, unspecified, uncomplicated: Secondary | ICD-10-CM

## 2019-05-30 DIAGNOSIS — I25119 Atherosclerotic heart disease of native coronary artery with unspecified angina pectoris: Secondary | ICD-10-CM

## 2019-05-30 LAB — GLUCOSE, POCT (MANUAL RESULT ENTRY): POC Glucose: 186 mg/dl — AB (ref 70–99)

## 2019-05-30 MED ORDER — ALBUTEROL SULFATE HFA 108 (90 BASE) MCG/ACT IN AERS: 2.0000 | INHALATION_SPRAY | RESPIRATORY_TRACT | 1 refills | Status: DC | PRN

## 2019-05-30 MED ORDER — HYDROCHLOROTHIAZIDE 25 MG PO TABS: 25.0000 mg | ORAL_TABLET | Freq: Every day | ORAL | 1 refills | Status: DC

## 2019-05-30 MED ORDER — METFORMIN HCL ER 500 MG PO TB24: 1000.0000 mg | ORAL_TABLET | Freq: Two times a day (BID) | ORAL | 1 refills | Status: DC

## 2019-05-30 MED ORDER — ASPIRIN 81 MG PO TBEC: DELAYED_RELEASE_TABLET | ORAL | 1 refills | Status: DC

## 2019-05-30 MED ORDER — ESOMEPRAZOLE MAGNESIUM 40 MG PO CPDR: 40.0000 mg | DELAYED_RELEASE_CAPSULE | Freq: Every day | ORAL | 1 refills | Status: DC

## 2019-05-30 MED ORDER — CARVEDILOL 6.25 MG PO TABS: 6.2500 mg | ORAL_TABLET | Freq: Two times a day (BID) | ORAL | 1 refills | Status: DC

## 2019-05-30 MED ORDER — NITROGLYCERIN 0.4 MG SL SUBL: 0.4000 mg | SUBLINGUAL_TABLET | SUBLINGUAL | 12 refills | Status: DC | PRN

## 2019-05-30 MED ORDER — AMLODIPINE BESYLATE 10 MG PO TABS: 10.0000 mg | ORAL_TABLET | Freq: Every day | ORAL | 1 refills | Status: DC

## 2019-05-30 MED ORDER — FARXIGA 5 MG PO TABS: 5.0000 mg | ORAL_TABLET | Freq: Every day | ORAL | 1 refills | Status: DC

## 2019-05-30 MED ORDER — RAMIPRIL 10 MG PO CAPS: 10.0000 mg | ORAL_CAPSULE | Freq: Every day | ORAL | 1 refills | Status: DC

## 2019-05-30 MED ORDER — LEVOTHYROXINE SODIUM 125 MCG PO TABS: 125.0000 ug | ORAL_TABLET | Freq: Every day | ORAL | 1 refills | Status: DC
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
## 2019-05-30 NOTE — Progress Notes (Signed)
New patient appointment.  DM/BP/Chol. Patient is fasting today.  States that FSBS readings have been normal at home. Denies nausea, vomiting, numbness/tingling in feet, polyuria, polydipsia. CBG 186  Was checking BP at the pharmacy prior to [place]. Denies chest pain, SHOB, headaches, palpitations, dizziness, lower extremity swelling.

## 2019-05-31 LAB — LIPID PANEL
Chol/HDL Ratio: 5.1 ratio — ABNORMAL HIGH (ref 0.0–5.0)
Cholesterol, Total: 242 mg/dL — ABNORMAL HIGH (ref 100–199)
HDL: 47 mg/dL (ref >39)
LDL Chol Calc (NIH): 148 mg/dL — ABNORMAL HIGH (ref 0–99)
Triglycerides: 256 mg/dL — ABNORMAL HIGH (ref 0–149)
VLDL Cholesterol Cal: 47 mg/dL — ABNORMAL HIGH (ref 5–40)

## 2019-05-31 LAB — CMP14+EGFR
ALT: 30 IU/L (ref 0–44)
AST: 22 IU/L (ref 0–40)
Albumin/Globulin Ratio: 1.8 (ref 1.2–2.2)
Albumin: 4.5 g/dL (ref 3.8–4.9)
Alkaline Phosphatase: 63 IU/L (ref 39–117)
BUN/Creatinine Ratio: 19 (ref 9–20)
BUN: 16 mg/dL (ref 6–24)
Bilirubin Total: 0.3 mg/dL (ref 0.0–1.2)
CO2: 25 mmol/L (ref 20–29)
Calcium: 10 mg/dL (ref 8.7–10.2)
Chloride: 99 mmol/L (ref 96–106)
Creatinine, Ser: 0.86 mg/dL (ref 0.76–1.27)
GFR calc Af Amer: 113 mL/min/{1.73_m2} (ref >59)
GFR calc non Af Amer: 98 mL/min/{1.73_m2} (ref >59)
Globulin, Total: 2.5 g/dL (ref 1.5–4.5)
Glucose: 130 mg/dL — ABNORMAL HIGH (ref 65–99)
Potassium: 4.6 mmol/L (ref 3.5–5.2)
Sodium: 142 mmol/L (ref 134–144)
Total Protein: 7 g/dL (ref 6.0–8.5)

## 2019-05-31 LAB — HEMOGLOBIN A1C
Est. average glucose Bld gHb Est-mCnc: 143 mg/dL
Hgb A1c MFr Bld: 6.6 % — ABNORMAL HIGH (ref 4.8–5.6)

## 2019-05-31 LAB — TSH: TSH: 7.49 u[IU]/mL — ABNORMAL HIGH (ref 0.450–4.500)

## 2019-05-31 LAB — MICROALBUMIN / CREATININE URINE RATIO
Creatinine, Urine: 65 mg/dL
Microalb/Creat Ratio: 134 mg/g creat — ABNORMAL HIGH (ref 0–29)
Microalbumin, Urine: 87.3 ug/mL

## 2019-06-01 NOTE — Telephone Encounter (Signed)
Pt has another pcp

## 2019-06-05 ENCOUNTER — Other Ambulatory Visit: Payer: Self-pay | Admitting: Nurse Practitioner

## 2019-06-05 DIAGNOSIS — E785 Hyperlipidemia, unspecified: Secondary | ICD-10-CM

## 2019-06-05 MED ORDER — ATORVASTATIN CALCIUM 40 MG PO TABS: 40.0000 mg | ORAL_TABLET | Freq: Every day | ORAL | 1 refills | Status: DC

## 2019-06-08 NOTE — Progress Notes (Signed)
Patient notified of results & recommendations. Expressed understanding. States that he had been out of medications for awhile but has restarted everything since last visit.

## 2019-06-12 ENCOUNTER — Other Ambulatory Visit: Payer: Self-pay

## 2019-06-12 ENCOUNTER — Ambulatory Visit (INDEPENDENT_AMBULATORY_CARE_PROVIDER_SITE_OTHER): Payer: 59

## 2019-06-12 VITALS — BP 142/94 | HR 72 | Temp 97.3°F | Resp 17

## 2019-06-12 DIAGNOSIS — I10 Essential (primary) hypertension: Secondary | ICD-10-CM | POA: Diagnosis not present

## 2019-06-12 NOTE — Progress Notes (Signed)
Patient here for BP check. Has taken BP medications this morning. After sitting BP was 142/94, pulse was 72. Patient advised to continue current medications & keep 3 month follow up appointment. KWalker, CMA.

## 2019-07-13 ENCOUNTER — Other Ambulatory Visit (INDEPENDENT_AMBULATORY_CARE_PROVIDER_SITE_OTHER): Payer: BC Managed Care – PPO

## 2019-07-13 ENCOUNTER — Encounter: Payer: Self-pay | Admitting: Internal Medicine

## 2019-07-13 ENCOUNTER — Ambulatory Visit (INDEPENDENT_AMBULATORY_CARE_PROVIDER_SITE_OTHER): Payer: BC Managed Care – PPO | Admitting: Internal Medicine

## 2019-07-13 ENCOUNTER — Other Ambulatory Visit: Payer: Self-pay

## 2019-07-13 VITALS — BP 126/78 | HR 70 | Temp 98.2°F | Ht 73.0 in | Wt 264.0 lb

## 2019-07-13 DIAGNOSIS — Z791 Long term (current) use of non-steroidal anti-inflammatories (NSAID): Secondary | ICD-10-CM

## 2019-07-13 DIAGNOSIS — D751 Secondary polycythemia: Secondary | ICD-10-CM

## 2019-07-13 DIAGNOSIS — R10819 Abdominal tenderness, unspecified site: Secondary | ICD-10-CM

## 2019-07-13 DIAGNOSIS — R208 Other disturbances of skin sensation: Secondary | ICD-10-CM

## 2019-07-13 DIAGNOSIS — F17200 Nicotine dependence, unspecified, uncomplicated: Secondary | ICD-10-CM | POA: Diagnosis not present

## 2019-07-13 LAB — CBC WITH DIFFERENTIAL/PLATELET
Basophils Absolute: 0.2 10*3/uL — ABNORMAL HIGH (ref 0.0–0.1)
Basophils Relative: 2.2 % (ref 0.0–3.0)
Eosinophils Absolute: 0.4 10*3/uL (ref 0.0–0.7)
Eosinophils Relative: 3.6 % (ref 0.0–5.0)
HCT: 48.6 % (ref 39.0–52.0)
Hemoglobin: 16.5 g/dL (ref 13.0–17.0)
Lymphocytes Relative: 24.3 % (ref 12.0–46.0)
Lymphs Abs: 2.4 10*3/uL (ref 0.7–4.0)
MCHC: 33.9 g/dL (ref 30.0–36.0)
MCV: 91.7 fl (ref 78.0–100.0)
Monocytes Absolute: 0.7 10*3/uL (ref 0.1–1.0)
Monocytes Relative: 6.9 % (ref 3.0–12.0)
Neutro Abs: 6.3 10*3/uL (ref 1.4–7.7)
Neutrophils Relative %: 63 % (ref 43.0–77.0)
Platelets: 262 10*3/uL (ref 150.0–400.0)
RBC: 5.3 Mil/uL (ref 4.22–5.81)
RDW: 14.4 % (ref 11.5–15.5)
WBC: 10 10*3/uL (ref 4.0–10.5)

## 2019-07-13 LAB — BUN: BUN: 15 mg/dL (ref 6–23)

## 2019-07-13 LAB — CREATININE, SERUM: Creatinine, Ser: 0.91 mg/dL (ref 0.40–1.50)
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
## 2019-07-13 NOTE — Patient Instructions (Signed)
If you are age 56 or older, your body mass index should be between 23-30. Your Body mass index is 34.83 kg/m. If this is out of the aforementioned range listed, please consider follow up with your Primary Care Provider.  If you are age 85 or younger, your body mass index should be between 19-25. Your Body mass index is 34.83 kg/m. If this is out of the aformentioned range listed, please consider follow up with your Primary Care Provider.   You have been scheduled for a CT scan of the abdomen and pelvis at [facility] ([address]---this is in the same building as [employer]).   You are scheduled on 07/18/2019 at 3:00pm. You should arrive 15 minutes prior to your appointment time for registration. Please follow the written instructions below on the day of your exam:  WARNING: IF YOU ARE ALLERGIC TO IODINE/X-RAY DYE, PLEASE NOTIFY RADIOLOGY IMMEDIATELY AT [phone]! YOU WILL BE GIVEN A 13 HOUR PREMEDICATION PREP.  1) Do not eat or drink anything after 11:00AM (4 hours prior to your test) 2) You have been given 2 bottles of oral contrast to drink. The solution may taste better if refrigerated, but do NOT add ice or any other liquid to this solution. Shake well before drinking.    Drink 1 bottle of contrast @ 1:00pm (2 hours prior to your exam)  Drink 1 bottle of contrast @ 2:00pm (1 hour prior to your exam)  You may take any medications as prescribed with a small amount of water, if necessary. If you take any of the following medications: METFORMIN, GLUCOPHAGE, GLUCOVANCE, AVANDAMET, RIOMET, FORTAMET, [place] MET, JANUMET, GLUMETZA or METAGLIP, you MAY be asked to HOLD this medication 48 hours AFTER the exam.  The purpose of you drinking the oral contrast is to aid in the visualization of your intestinal tract. The contrast solution may cause some diarrhea. Depending on your individual set of symptoms, you may also receive an intravenous injection of x-ray  contrast/dye. Plan on being at [facility] for 30 minutes or longer, depending on the type of exam you are having performed.  This test typically takes 30-45 minutes to complete.  If you have any questions regarding your exam or if you need to reschedule, you may call the CT department at [phone] between the hours of 8:00 am and 5:00 pm, Monday-Friday.  ________________________________________________________________________  I appreciate the opportunity to care for you. Silvano Rusk, MD, [facility]

## 2019-07-18 ENCOUNTER — Ambulatory Visit (HOSPITAL_COMMUNITY)
Admission: RE | Admit: 2019-07-18 | Discharge: 2019-07-18 | Disposition: A | Discharge status: Home / Self Care | Payer: BC Managed Care – PPO | Source: Ambulatory Visit | Attending: Internal Medicine | Admitting: Internal Medicine

## 2019-07-18 ENCOUNTER — Encounter (HOSPITAL_COMMUNITY): Payer: Self-pay

## 2019-07-18 ENCOUNTER — Encounter: Payer: Self-pay | Admitting: Internal Medicine

## 2019-07-18 ENCOUNTER — Other Ambulatory Visit: Payer: Self-pay

## 2019-07-18 DIAGNOSIS — R10819 Abdominal tenderness, unspecified site: Secondary | ICD-10-CM

## 2019-07-18 DIAGNOSIS — K5732 Diverticulitis of large intestine without perforation or abscess without bleeding: Secondary | ICD-10-CM | POA: Insufficient documentation

## 2019-07-18 DIAGNOSIS — D751 Secondary polycythemia: Secondary | ICD-10-CM

## 2019-07-18 DIAGNOSIS — R109 Unspecified abdominal pain: Secondary | ICD-10-CM | POA: Diagnosis not present

## 2019-07-18 DIAGNOSIS — R208 Other disturbances of skin sensation: Secondary | ICD-10-CM

## 2019-07-18 HISTORY — DX: Diverticulitis of large intestine without perforation or abscess without bleeding: K57.32

## 2019-07-18 MED ORDER — SODIUM CHLORIDE (PF) 0.9 % IJ SOLN
INTRAMUSCULAR | Status: AC
  Filled 2019-07-18: qty 50

## 2019-07-18 MED ORDER — IOHEXOL 300 MG/ML  SOLN
100.0000 mL | Freq: Once | INTRAMUSCULAR | Status: AC | PRN
  Administered 2019-07-18: 100 mL via INTRAVENOUS

## 2019-07-19 ENCOUNTER — Other Ambulatory Visit: Payer: Self-pay

## 2019-07-19 MED ORDER — METRONIDAZOLE 500 MG PO TABS: 500.0000 mg | ORAL_TABLET | Freq: Two times a day (BID) | ORAL | 0 refills | Status: DC

## 2019-07-19 MED ORDER — CIPROFLOXACIN HCL 500 MG PO TABS: 500.0000 mg | ORAL_TABLET | Freq: Two times a day (BID) | ORAL | 0 refills | Status: DC

## 2019-08-14 ENCOUNTER — Ambulatory Visit (INDEPENDENT_AMBULATORY_CARE_PROVIDER_SITE_OTHER): Payer: BC Managed Care – PPO | Admitting: Internal Medicine

## 2019-08-14 ENCOUNTER — Encounter: Payer: Self-pay | Admitting: Internal Medicine

## 2019-08-14 VITALS — BP 130/88 | HR 76 | Temp 98.0°F | Ht 72.75 in | Wt 257.1 lb

## 2019-08-14 DIAGNOSIS — R1032 Left lower quadrant pain: Secondary | ICD-10-CM

## 2019-08-14 DIAGNOSIS — K5732 Diverticulitis of large intestine without perforation or abscess without bleeding: Secondary | ICD-10-CM

## 2019-08-14 DIAGNOSIS — R102 Pelvic and perineal pain: Secondary | ICD-10-CM | POA: Diagnosis not present

## 2019-08-14 DIAGNOSIS — Z01818 Encounter for other preprocedural examination: Secondary | ICD-10-CM

## 2019-08-14 MED ORDER — CIPROFLOXACIN HCL 500 MG PO TABS: 500.0000 mg | ORAL_TABLET | Freq: Two times a day (BID) | ORAL | 0 refills | Status: AC

## 2019-08-14 MED ORDER — METRONIDAZOLE 500 MG PO TABS: 500.0000 mg | ORAL_TABLET | Freq: Two times a day (BID) | ORAL | 0 refills | Status: DC
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
## 2019-08-14 NOTE — Patient Instructions (Signed)
You have been scheduled for a colonoscopy. Please follow written instructions given to you at your visit today.  Please pick up your prep supplies at the pharmacy within the next 1-3 days. If you use inhalers (even only as needed), please bring them with you on the day of your procedure.   We have sent the following medications to your pharmacy for you to pick up at your convenience: Cipro and flagyl   I appreciate the opportunity to care for you. Silvano Rusk, MD, [facility]

## 2019-08-24 ENCOUNTER — Other Ambulatory Visit: Payer: Self-pay | Admitting: Internal Medicine

## 2019-08-24 ENCOUNTER — Other Ambulatory Visit: Payer: Self-pay

## 2019-08-24 ENCOUNTER — Ambulatory Visit (INDEPENDENT_AMBULATORY_CARE_PROVIDER_SITE_OTHER): Payer: BC Managed Care – PPO

## 2019-08-24 DIAGNOSIS — Z1159 Encounter for screening for other viral diseases: Secondary | ICD-10-CM

## 2019-08-24 LAB — SARS CORONAVIRUS 2 (TAT 6-24 HRS): SARS Coronavirus 2: NEGATIVE

## 2019-08-27 HISTORY — PX: COLONOSCOPY W/ POLYPECTOMY: SHX1380

## 2019-08-28 ENCOUNTER — Encounter: Payer: Self-pay | Admitting: Internal Medicine

## 2019-08-28 ENCOUNTER — Other Ambulatory Visit: Payer: Self-pay

## 2019-08-28 ENCOUNTER — Ambulatory Visit (AMBULATORY_SURGERY_CENTER): Payer: BC Managed Care – PPO | Admitting: Internal Medicine

## 2019-08-28 VITALS — BP 134/86 | HR 61 | Temp 97.5°F | Resp 12 | Ht 72.0 in | Wt 257.0 lb

## 2019-08-28 DIAGNOSIS — D122 Benign neoplasm of ascending colon: Secondary | ICD-10-CM | POA: Diagnosis not present

## 2019-08-28 DIAGNOSIS — D125 Benign neoplasm of sigmoid colon: Secondary | ICD-10-CM

## 2019-08-28 DIAGNOSIS — K5730 Diverticulosis of large intestine without perforation or abscess without bleeding: Secondary | ICD-10-CM | POA: Diagnosis not present

## 2019-08-28 DIAGNOSIS — D123 Benign neoplasm of transverse colon: Secondary | ICD-10-CM | POA: Diagnosis not present

## 2019-08-28 DIAGNOSIS — D124 Benign neoplasm of descending colon: Secondary | ICD-10-CM

## 2019-08-28 DIAGNOSIS — K648 Other hemorrhoids: Secondary | ICD-10-CM | POA: Diagnosis not present

## 2019-08-28 DIAGNOSIS — R1032 Left lower quadrant pain: Secondary | ICD-10-CM

## 2019-08-28 MED ORDER — SODIUM CHLORIDE 0.9 % IV SOLN: 500.0000 mL | INTRAVENOUS | Status: DC

## 2019-08-28 NOTE — Progress Notes (Signed)
A and O x3. Report to RN. Tolerated MAC anesthesia well.

## 2019-08-28 NOTE — Progress Notes (Signed)
Called to room to assist during endoscopic procedure.  Patient ID and intended procedure confirmed with present staff. Received instructions for my participation in the procedure from the performing physician.  

## 2019-08-28 NOTE — Patient Instructions (Addendum)
I removed 7 small polyp - not related to the pain.  You have diverticulosis but no diverticulitis.  I am not sure why you are hurting.  It might be what is called neuropathic pain which can be treated with certain medications that reduce pain signals from nerves.  I will let you know pathology results and when to have another routine colonoscopy by mail and/or My Chart.  I appreciate the opportunity to care for you. Gatha Mayer, MD, [facility]  Please read handouts provided. Continue present medications. Await pathology results.     YOU HAD AN ENDOSCOPIC PROCEDURE TODAY AT [facility]:   Refer to the procedure report that was given to you for any specific questions about what was found during the examination.  If the procedure report does not answer your questions, please call your gastroenterologist to clarify.  If you requested that your care partner not be given the details of your procedure findings, then the procedure report has been included in a sealed envelope for you to review at your convenience later.  YOU SHOULD EXPECT: Some feelings of bloating in the abdomen. Passage of more gas than usual.  Walking can help get rid of the air that was put into your GI tract during the procedure and reduce the bloating. If you had a lower endoscopy (such as a colonoscopy or flexible sigmoidoscopy) you may notice spotting of blood in your stool or on the toilet paper. If you underwent a bowel prep for your procedure, you may not have a normal bowel movement for a few days.  Please Note:  You might notice some irritation and congestion in your nose or some drainage.  This is from the oxygen used during your procedure.  There is no need for concern and it should clear up in a day or so.  SYMPTOMS TO REPORT IMMEDIATELY:   Following lower endoscopy (colonoscopy or flexible sigmoidoscopy):  Excessive amounts of blood in the stool  Significant tenderness or worsening of abdominal  pains  Swelling of the abdomen that is new, acute  Fever of 100F or higher    For urgent or emergent issues, a gastroenterologist can be reached at any hour by calling [phone]. Do not use MyChart messaging for urgent concerns.    DIET:  We do recommend a small meal at first, but then you may proceed to your regular diet.  Drink plenty of fluids but you should avoid alcoholic beverages for 24 hours.  ACTIVITY:  You should plan to take it easy for the rest of today and you should NOT DRIVE or use heavy machinery until tomorrow (because of the sedation medicines used during the test).    FOLLOW UP: Our staff will call the number listed on your records 48-72 hours following your procedure to check on you and address any questions or concerns that you may have regarding the information given to you following your procedure. If we do not reach you, we will leave a message.  We will attempt to reach you two times.  During this call, we will ask if you have developed any symptoms of COVID 19. If you develop any symptoms (ie: fever, flu-like symptoms, shortness of breath, cough etc.) before then, please call [phone].  If you test positive for Covid 19 in the 2 weeks post procedure, please call and report this information to [place].    If any biopsies were taken you will be contacted by phone or by letter within the  next 1-3 weeks.  Please call us at [phone] if you have not heard about the biopsies in 3 weeks.    SIGNATURES/CONFIDENTIALITY: You and/or your care partner have signed paperwork which will be entered into your electronic medical record.  These signatures attest to the fact that that the information above on your After Visit Summary has been reviewed and is understood.  Full responsibility of the confidentiality of this discharge information lies with you and/or your care-partner.

## 2019-08-28 NOTE — Op Note (Addendum)
[facility] Patient Name: Colin Ramirez Procedure Date: 08/28/2019 2:54 PM MRN: [id number] Endoscopist: Gatha Mayer , MD Age: 54 Referring MD:  Date of Birth: 06/20/1966 Gender: Male Account #: [id number] Procedure:                Colonoscopy Indications:              Abdominal pain in the left lower quadrant,                            Suspected diverticulitis Medicines:                Propofol per Anesthesia, Monitored Anesthesia Care Procedure:                Pre-Anesthesia Assessment:                           - Prior to the procedure, a History and Physical                            was performed, and patient medications and                            allergies were reviewed. The patient's tolerance of                            previous anesthesia was also reviewed. The risks                            and benefits of the procedure and the sedation                            options and risks were discussed with the patient.                            All questions were answered, and informed consent                            was obtained. Prior Anticoagulants: The patient has                            taken no previous anticoagulant or antiplatelet                            agents. ASA Grade Assessment: II - A patient with                            mild systemic disease. After reviewing the risks                            and benefits, the patient was deemed in                            satisfactory condition to undergo the procedure.  After obtaining informed consent, the colonoscope                            was passed under direct vision. Throughout the                            procedure, the patient's blood pressure, pulse, and                            oxygen saturations were monitored continuously. The                            Colonoscope was introduced through the anus and                            advanced to the the  cecum, identified by                            appendiceal orifice and ileocecal valve. The                            colonoscopy was performed without difficulty. The                            patient tolerated the procedure well. The quality                            of the bowel preparation was good. The bowel                            preparation used was MoviPrep via split dose                            instruction. The ileocecal valve, appendiceal                            orifice, and rectum were photographed. Scope In: 3:09:27 PM Scope Out: 3:29:44 PM Scope Withdrawal Time: 0 hours 18 minutes 1 second  Total Procedure Duration: 0 hours 20 minutes 17 seconds  Findings:                 The perianal and digital rectal examinations were                            normal. Pertinent negatives include normal prostate                            (size, shape, and consistency).                           Seven sessile polyps were found in the sigmoid                            colon, descending colon, transverse colon and  ascending colon. The polyps were diminutive in                            size. These polyps were removed with a cold snare.                            Resection and retrieval were complete. Verification                            of patient identification for the specimen was                            done. Estimated blood loss was minimal.                           A few small-mouthed diverticula were found in the                            sigmoid colon and descending colon.                           External and internal hemorrhoids were found.                           The exam was otherwise without abnormality on                            direct and retroflexion views. Complications:            No immediate complications. Estimated Blood Loss:     Estimated blood loss was minimal. Impression:               - Seven diminutive polyps  in the sigmoid colon, in                            the descending colon, in the transverse colon and                            in the ascending colon, removed with a cold snare.                            Resected and retrieved.                           - Diverticulosis in the sigmoid colon and in the                            descending colon.                           - External and internal hemorrhoids.                           - The examination was otherwise normal on direct  and retroflexion views. Recommendation:           - Patient has a contact number available for                            emergencies. The signs and symptoms of potential                            delayed complications were discussed with the                            patient. Return to normal activities tomorrow.                            Written discharge instructions were provided to the                            patient.                           - Resume previous diet.                           - Continue present medications.                           - Repeat colonoscopy is recommended. The                            colonoscopy date will be determined after pathology                            results from today's exam become available for                            review.                           - LLQ pain that was ? diverticulitis by CT might be                            neuropathic pain. Consider neuromodulator Tx vs                            observation, other imaging? DISCUSSED IN RECOVERY -                            OBSERVE Gatha Mayer, MD 08/28/2019 3:44:51 PM This report has been signed electronically.

## 2019-08-30 ENCOUNTER — Telehealth: Payer: Self-pay

## 2019-08-30 ENCOUNTER — Telehealth: Payer: Self-pay | Admitting: *Deleted

## 2019-08-30 ENCOUNTER — Ambulatory Visit (INDEPENDENT_AMBULATORY_CARE_PROVIDER_SITE_OTHER): Payer: BC Managed Care – PPO | Admitting: Internal Medicine

## 2019-08-30 ENCOUNTER — Encounter: Payer: Self-pay | Admitting: Internal Medicine

## 2019-08-30 DIAGNOSIS — I10 Essential (primary) hypertension: Secondary | ICD-10-CM | POA: Diagnosis not present

## 2019-08-30 DIAGNOSIS — E785 Hyperlipidemia, unspecified: Secondary | ICD-10-CM

## 2019-08-30 DIAGNOSIS — E119 Type 2 diabetes mellitus without complications: Secondary | ICD-10-CM

## 2019-08-30 DIAGNOSIS — G4733 Obstructive sleep apnea (adult) (pediatric): Secondary | ICD-10-CM

## 2019-08-30 DIAGNOSIS — E039 Hypothyroidism, unspecified: Secondary | ICD-10-CM

## 2019-08-30 MED ORDER — RAMIPRIL 10 MG PO CAPS: 10.0000 mg | ORAL_CAPSULE | Freq: Every day | ORAL | 3 refills | Status: DC

## 2019-08-30 MED ORDER — MISC. DEVICES MISC: 0 refills | Status: AC

## 2019-08-30 MED ORDER — CARVEDILOL 6.25 MG PO TABS: 6.2500 mg | ORAL_TABLET | Freq: Two times a day (BID) | ORAL | 3 refills | Status: DC

## 2019-08-30 MED ORDER — FARXIGA 5 MG PO TABS: 5.0000 mg | ORAL_TABLET | Freq: Every day | ORAL | 3 refills | Status: DC

## 2019-08-30 MED ORDER — HYDROCHLOROTHIAZIDE 25 MG PO TABS: 25.0000 mg | ORAL_TABLET | Freq: Every day | ORAL | 3 refills | Status: DC

## 2019-08-30 MED ORDER — LEVOTHYROXINE SODIUM 125 MCG PO TABS: 125.0000 ug | ORAL_TABLET | Freq: Every day | ORAL | 3 refills | Status: DC

## 2019-08-30 MED ORDER — AMLODIPINE BESYLATE 10 MG PO TABS: 10.0000 mg | ORAL_TABLET | Freq: Every day | ORAL | 3 refills | Status: DC

## 2019-08-30 MED ORDER — METFORMIN HCL ER 500 MG PO TB24: 1000.0000 mg | ORAL_TABLET | Freq: Two times a day (BID) | ORAL | 3 refills | Status: DC

## 2019-08-30 MED ORDER — ATORVASTATIN CALCIUM 40 MG PO TABS: 40.0000 mg | ORAL_TABLET | Freq: Every day | ORAL | 3 refills | Status: DC

## 2019-08-30 NOTE — Telephone Encounter (Signed)
Faxed order for CPAP mask, tank & tubing to [facility](ph. [phone], fa. [phone]). Received confirmation that fax went through.

## 2019-08-30 NOTE — Telephone Encounter (Signed)
  Follow up Call-  No flowsheet data found.   Patient questions:  Message left to call if necessary.  Home phone called.

## 2019-08-30 NOTE — Addendum Note (Signed)
Addended by: Melina Schools on: 08/30/2019 10:10 AM   Modules accepted: Orders, Level of Service

## 2019-08-30 NOTE — Telephone Encounter (Signed)
  Follow up Call-called [phone]  LMOM to call back with any questions or concerns.  Also, call back if patient has developed fever, respiratory issues or been dx with COVID or had any family members or close contacts diagnosed since her procedure.
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
## 2019-09-10 ENCOUNTER — Encounter: Payer: Self-pay | Admitting: Internal Medicine

## 2019-09-10 DIAGNOSIS — Z86010 Personal history of colonic polyps: Secondary | ICD-10-CM

## 2019-09-10 DIAGNOSIS — Z860101 Personal history of adenomatous and serrated colon polyps: Secondary | ICD-10-CM

## 2019-09-10 HISTORY — DX: Personal history of colonic polyps: Z86.010

## 2019-09-10 HISTORY — DX: Personal history of adenomatous and serrated colon polyps: Z86.0101

## 2019-11-28 ENCOUNTER — Other Ambulatory Visit: Payer: Self-pay | Admitting: Nurse Practitioner

## 2019-11-28 DIAGNOSIS — K219 Gastro-esophageal reflux disease without esophagitis: Secondary | ICD-10-CM

## 2019-11-29 ENCOUNTER — Telehealth: Payer: Self-pay

## 2019-11-29 NOTE — Telephone Encounter (Signed)
Called patient to do their pre-visit COVID screening.  Call went to voicemail. Unable to do prescreening.  

## 2019-11-30 ENCOUNTER — Ambulatory Visit (INDEPENDENT_AMBULATORY_CARE_PROVIDER_SITE_OTHER): Payer: BC Managed Care – PPO | Admitting: Internal Medicine

## 2019-11-30 ENCOUNTER — Encounter: Payer: Self-pay | Admitting: Internal Medicine

## 2019-11-30 ENCOUNTER — Other Ambulatory Visit: Payer: Self-pay

## 2019-11-30 VITALS — BP 145/93 | HR 66 | Temp 97.5°F | Resp 17 | Wt 255.0 lb

## 2019-11-30 DIAGNOSIS — E782 Mixed hyperlipidemia: Secondary | ICD-10-CM

## 2019-11-30 DIAGNOSIS — E119 Type 2 diabetes mellitus without complications: Secondary | ICD-10-CM | POA: Diagnosis not present

## 2019-11-30 DIAGNOSIS — E039 Hypothyroidism, unspecified: Secondary | ICD-10-CM | POA: Diagnosis not present

## 2019-11-30 DIAGNOSIS — I10 Essential (primary) hypertension: Secondary | ICD-10-CM

## 2019-11-30 LAB — POCT GLYCOSYLATED HEMOGLOBIN (HGB A1C): Hemoglobin A1C: 8.3 % — AB (ref 4.0–5.6)

## 2019-11-30 LAB — GLUCOSE, POCT (MANUAL RESULT ENTRY): POC Glucose: 183 mg/dl — AB (ref 70–99)

## 2019-11-30 MED ORDER — DAPAGLIFLOZIN PROPANEDIOL 10 MG PO TABS: 10.0000 mg | ORAL_TABLET | Freq: Every day | ORAL | 1 refills | Status: DC

## 2019-11-30 NOTE — Progress Notes (Signed)
Subjective:    Colin Ramirez - 54 y.o. male MRN [id number]  Date of birth: 10-07-1965  HPI  Colin Ramirez is here for follow up of chronic medical conditions.  Chronic HTN Disease Monitoring:  Home BP Monitoring - Does not monitor  Chest pain- no  Dyspnea- no Headache - no  Medications: Amlodipine 10 mg, HCTZ 25 mg, Coreg 6.25 mg BID, Ramipril 10 mg  Compliance- no, missing some of the medications he takes at nighttime   Lightheadedness- no  Edema- no   Diabetes mellitus, Type 2 Disease Monitoring             Blood Sugar Ranges: Not monitoring              Polyuria: no              Visual problems: no   Urine Microalbumin Elevated. On Ace therapy.   Last A1C: 6.6 (Dec 2020)   Medications: Farxiga 5 mg, Metformin 1000 mg BID Medication Compliance: no, missing nighttime metformin occasionally  Medication Side Effects             Hypoglycemia: no      Health Maintenance:  Health Maintenance Due  Topic Date Due  . COVID-19 Vaccine (1) Never done  . OPHTHALMOLOGY EXAM  08/09/2019  . HEMOGLOBIN A1C  11/28/2019    -  reports that he has been smoking cigarettes. He has a 30.00 pack-year smoking history. He has never used smokeless tobacco. - Review of Systems: Per HPI. - Past Medical History: Patient Active Problem List   Diagnosis Date Noted  . Hx of adenomatous colonic polyps 09/10/2019  . Type 2 diabetes mellitus ([place]) 08/30/2019  . Sigmoid diverticulitis 07/18/2019  . Facial twitching 06/07/2018  . Atherosclerosis of native coronary artery with angina pectoris ([place]) 10/28/2017  . Bilateral hearing loss 04/19/2017  . Unilateral primary osteoarthritis, left hip 08/20/2016  . Status post left hip replacement 08/20/2016  . Hypothyroidism 08/19/2012  . Degenerative disc disease 12/02/2011  . Tobacco abuse 12/02/2011  . HTN (hypertension) 11/08/2011  . Hyperlipidemia 11/08/2011  . GERD (gastroesophageal reflux disease) 11/08/2011  . Sleep  apnea 11/08/2011   - Medications: reviewed and updated   Objective:   Physical Exam BP (!) 145/93   Pulse 66   Temp (!) 97.5 F (36.4 C) (Temporal)   Resp 17   Wt 255 lb (115.7 kg)   SpO2 96%   BMI 34.58 kg/m  Physical Exam  Constitutional: He is oriented to person, place, and time and well-developed, well-nourished, and in no distress. No distress.  HENT:  Head: Normocephalic and atraumatic.  Eyes: Conjunctivae and EOM are normal.  Cardiovascular: Normal rate, regular rhythm and normal heart sounds.  No murmur heard. Pulmonary/Chest: Effort normal and breath sounds normal. No respiratory distress.  Musculoskeletal:        General: Normal range of motion.  Neurological: He is alert and oriented to person, place, and time.  Skin: Skin is warm and dry. He is not diaphoretic.  Psychiatric: Affect and judgment normal.           Assessment & Plan:   1. Type 2 diabetes mellitus without complication, without long-term current use of insulin (HCC) A1c shows worsening of glucemic control. Likely related to dietary indiscretions as patient reports he has increased soda and carb intake recently. Missing nighttime Metformin occasionally likely also contributing. Emphasized diet adherence and medication compliance. Will increase Farxiga dose to 10 mg daily. Advised adequate hydration with  this medication.  Renal function is normal.  Counseled on Diabetic diet, my plate method, 008 minutes of moderate intensity exercise/week Blood sugar logs with fasting goals of 80-120 mg/dl, random of less than 180 and in the event of sugars less than 60 mg/dl or greater than 400 mg/dl encouraged to notify the clinic. Advised on the need for annual eye exams, annual foot exams, Pneumonia vaccine. - Glucose (CBG) - HgB A1c - HM Diabetes Foot Exam - Lipid Panel - Ambulatory referral to Ophthalmology - dapagliflozin propanediol (FARXIGA) 10 MG TABS tablet; Take 1 tablet (10 mg total) by mouth daily.   Dispense: 90 tablet; Refill: 1  2. Essential hypertension BP is slightly above goal, likely worsened by diet and missing nighttime medications. Discussed strategies for increased medication compliance. Will continue to monitor. Could consider increase of Ramipril if remains elevated.   3. Mixed hyperlipidemia Reports compliance with Lipitor. Last LDL elevated at 148. May benefit from increase in Lipitor dose if still elevated.  - Lipid Panel  4. Hypothyroidism, unspecified type Last TSH slightly elevated at 7.4 in Dec 2020. Repeat and adjust Synthroid dose as needed.  - TSH     Phill Myron, D.O. 11/30/2019, 9:18 AM Primary Care at [facility]

## 2019-11-30 NOTE — Patient Instructions (Addendum)
Good to see you today! Increase your [place] to 10 mg. Take two of your current medication until you run out and then pick up a new Rx which will be for the higher dose. Make sure you stay adequately hydrated with this medication. Try to cut back on the coke and bread intake to help with blood sugar control as well. We will call you next week with your lab results.   Take Care,  Dr. Juleen China

## 2019-12-01 LAB — LIPID PANEL
Chol/HDL Ratio: 4.8 ratio (ref 0.0–5.0)
Cholesterol, Total: 183 mg/dL (ref 100–199)
HDL: 38 mg/dL — ABNORMAL LOW (ref >39)
LDL Chol Calc (NIH): 116 mg/dL — ABNORMAL HIGH (ref 0–99)
Triglycerides: 161 mg/dL — ABNORMAL HIGH (ref 0–149)
VLDL Cholesterol Cal: 29 mg/dL (ref 5–40)

## 2019-12-01 LAB — TSH: TSH: 8.36 u[IU]/mL — ABNORMAL HIGH (ref 0.450–4.500)

## 2019-12-05 ENCOUNTER — Other Ambulatory Visit: Payer: Self-pay | Admitting: Internal Medicine

## 2019-12-05 DIAGNOSIS — E039 Hypothyroidism, unspecified: Secondary | ICD-10-CM

## 2019-12-05 DIAGNOSIS — E785 Hyperlipidemia, unspecified: Secondary | ICD-10-CM

## 2019-12-05 MED ORDER — LEVOTHYROXINE SODIUM 137 MCG PO TABS: 137.0000 ug | ORAL_TABLET | Freq: Every day | ORAL | 0 refills | Status: DC

## 2019-12-05 MED ORDER — ATORVASTATIN CALCIUM 80 MG PO TABS: 80.0000 mg | ORAL_TABLET | Freq: Every day | ORAL | 1 refills | Status: DC

## 2019-12-27 ENCOUNTER — Other Ambulatory Visit: Payer: Self-pay | Admitting: Nurse Practitioner

## 2019-12-27 DIAGNOSIS — E039 Hypothyroidism, unspecified: Secondary | ICD-10-CM

## 2019-12-27 NOTE — Progress Notes (Signed)
Patient notified of results & recommendations. Expressed understanding.

## 2020-01-05 ENCOUNTER — Other Ambulatory Visit: Payer: Self-pay | Admitting: Nurse Practitioner

## 2020-01-05 DIAGNOSIS — E039 Hypothyroidism, unspecified: Secondary | ICD-10-CM

## 2020-01-05 NOTE — Telephone Encounter (Signed)
[facility] does not fill prescriptions for this practice

## 2020-01-07 ENCOUNTER — Telehealth: Payer: Self-pay | Admitting: Internal Medicine

## 2020-01-07 DIAGNOSIS — E785 Hyperlipidemia, unspecified: Secondary | ICD-10-CM

## 2020-01-07 DIAGNOSIS — K219 Gastro-esophageal reflux disease without esophagitis: Secondary | ICD-10-CM

## 2020-01-07 DIAGNOSIS — E039 Hypothyroidism, unspecified: Secondary | ICD-10-CM

## 2020-01-07 MED ORDER — LEVOTHYROXINE SODIUM 137 MCG PO TABS: 137.0000 ug | ORAL_TABLET | Freq: Every day | ORAL | 0 refills | Status: DC

## 2020-01-07 MED ORDER — ATORVASTATIN CALCIUM 80 MG PO TABS: 80.0000 mg | ORAL_TABLET | Freq: Every day | ORAL | 1 refills | Status: DC

## 2020-01-07 MED ORDER — ESOMEPRAZOLE MAGNESIUM 40 MG PO CPDR: DELAYED_RELEASE_CAPSULE | ORAL | 1 refills | Status: DC

## 2020-01-07 NOTE — Telephone Encounter (Signed)
Patient wife called saying she called [facility] for refills on mostly all of patients medications and was told that patient did not have any refills. Patient wife would like to speak with the nurse. Please f/u

## 2020-01-07 NOTE — Telephone Encounter (Signed)
I called [facility]. They had refills on everything except for the Nexium & Atorvastatin 80 mg(increased dose). They did receive the Levothyroxine 137 mcg dose but patient's wife told them that he was not on this dose. Informed pharmacy that the 137 mcg dose was the correct dose after recent labs.  We do not have a DPR on file that states that I'm able to speak with patient's wife. DPR states that [facility] can speak with her.

## 2020-01-09 ENCOUNTER — Other Ambulatory Visit: Payer: Self-pay

## 2020-01-09 DIAGNOSIS — E119 Type 2 diabetes mellitus without complications: Secondary | ICD-10-CM

## 2020-01-09 MED ORDER — DAPAGLIFLOZIN PROPANEDIOL 10 MG PO TABS: 10.0000 mg | ORAL_TABLET | Freq: Every day | ORAL | 1 refills | Status: DC

## 2020-03-06 ENCOUNTER — Encounter: Payer: Self-pay | Admitting: Internal Medicine

## 2020-03-06 ENCOUNTER — Other Ambulatory Visit: Payer: Self-pay

## 2020-03-06 ENCOUNTER — Ambulatory Visit (INDEPENDENT_AMBULATORY_CARE_PROVIDER_SITE_OTHER): Payer: BC Managed Care – PPO | Admitting: Internal Medicine

## 2020-03-06 VITALS — BP 142/90 | HR 68 | Temp 97.5°F | Resp 17 | Wt 254.0 lb

## 2020-03-06 DIAGNOSIS — E782 Mixed hyperlipidemia: Secondary | ICD-10-CM | POA: Diagnosis not present

## 2020-03-06 DIAGNOSIS — E039 Hypothyroidism, unspecified: Secondary | ICD-10-CM

## 2020-03-06 DIAGNOSIS — E119 Type 2 diabetes mellitus without complications: Secondary | ICD-10-CM

## 2020-03-06 DIAGNOSIS — I10 Essential (primary) hypertension: Secondary | ICD-10-CM

## 2020-03-06 LAB — POCT GLYCOSYLATED HEMOGLOBIN (HGB A1C): Hemoglobin A1C: 8.5 % — AB (ref 4.0–5.6)

## 2020-03-06 NOTE — Progress Notes (Signed)
Subjective:    Colin Ramirez - 54 y.o. male MRN [id number]  Date of birth: [birth year]  HPI  Colin Ramirez is here for follow up of chronic medical conditions.  Chronic HTN Disease Monitoring:  Home BP Monitoring - 130-140/80s  Chest pain- no  Dyspnea- no Headache - no  Medications: Amlodipine 10 mg, Coreg 6.25 mg BID, HCTZ 25 mg, Ramipril 10 mg  Compliance- yes Lightheadedness- no  Edema- no    Diabetes mellitus, Type 2 Patient reports he know the culprit of his poor glycemic control. He is still drinking numerous cokes per day. Has had a hard time quitting this habit.   Disease Monitoring             Blood Sugar Ranges: Fasting - Does not monitor              Polyuria: no             Visual problems: no   Urine Microalbumin 134 (Dec 2020)--on Ace   Last A1C: 8.3 (June 2021)   Medications: Metformin 1000 mg BID, Farxiga 10 mg  Medication Compliance: yes  Medication Side Effects             Hypoglycemia: no       Health Maintenance:  Health Maintenance Due  Topic Date Due  . OPHTHALMOLOGY EXAM  08/09/2019    -  reports that he has been smoking cigarettes. He has a 30.00 pack-year smoking history. He has never used smokeless tobacco. - Review of Systems: Per HPI. - Past Medical History: Patient Active Problem List   Diagnosis Date Noted  . Hx of adenomatous colonic polyps 09/10/2019  . Type 2 diabetes mellitus ([name]) 08/30/2019  . Sigmoid diverticulitis 07/18/2019  . Facial twitching 06/07/2018  . Atherosclerosis of native coronary artery with angina pectoris ([place]) 10/28/2017  . Bilateral hearing loss 04/19/2017  . Unilateral primary osteoarthritis, left hip 08/20/2016  . Status post left hip replacement 08/20/2016  . Hypothyroidism 08/19/2012  . Degenerative disc disease 12/02/2011  . Tobacco abuse 12/02/2011  . HTN (hypertension) 11/08/2011  . Hyperlipidemia 11/08/2011  . GERD (gastroesophageal reflux disease) 11/08/2011  .  Sleep apnea 11/08/2011   - Medications: reviewed and updated   Objective:   Physical Exam BP (!) 142/90   Pulse 68   Temp (!) 97.5 F (36.4 C) (Temporal)   Resp 17   Wt 254 lb (115.2 kg)   SpO2 95%   BMI 34.45 kg/m  Physical Exam Constitutional:      General: He is not in acute distress.    Appearance: He is not diaphoretic.  HENT:     Head: Normocephalic and atraumatic.  Eyes:     Conjunctiva/sclera: Conjunctivae normal.  Cardiovascular:     Rate and Rhythm: Normal rate and regular rhythm.     Heart sounds: Normal heart sounds. No murmur heard.   Pulmonary:     Effort: Pulmonary effort is normal. No respiratory distress.     Breath sounds: Normal breath sounds.  Musculoskeletal:        General: Normal range of motion.  Skin:    General: Skin is warm and dry.  Neurological:     Mental Status: He is alert and oriented to person, place, and time.  Psychiatric:        Mood and Affect: Affect normal.        Judgment: Judgment normal.            Assessment & Plan:  1. Type 2 diabetes mellitus without complication, without long-term current use of insulin (HCC) A1c has worsened to 8.5. Patient resistant to adding other medication as likely related to dietary indiscretions. Patient to decrease or eliminate soda from diet. Repeat A1c in 3 months. Discussed if still elevated, would need additional agent.  - Lipid Panel - HgB A1c  2. Essential hypertension BP slightly above goal but better improved at home. Continue current regimen. Asymptomatic. Will monitor.  - Lipid Panel  3. Mixed hyperlipidemia Last LDL above goal with result of 116. Increased Lipitor to 80 mg. Repeat testing. If still above goal, consider addition of Zetia.  - Lipid Panel  4. Hypothyroidism, unspecified type Last TSH was elevated to 8.3. Synthroid was increased to 137 mcg. Repeat TSH today to adjust dose as needed.  - TSH     Phill Myron, D.O. 03/06/2020, 11:13 AM Primary Care at  [facility]

## 2020-03-07 LAB — LIPID PANEL
Chol/HDL Ratio: 4.1 ratio (ref 0.0–5.0)
Cholesterol, Total: 143 mg/dL (ref 100–199)
HDL: 35 mg/dL — ABNORMAL LOW (ref >39)
LDL Chol Calc (NIH): 72 mg/dL (ref 0–99)
Triglycerides: 219 mg/dL — ABNORMAL HIGH (ref 0–149)
VLDL Cholesterol Cal: 36 mg/dL (ref 5–40)

## 2020-03-07 LAB — TSH: TSH: 6.4 u[IU]/mL — ABNORMAL HIGH (ref 0.450–4.500)

## 2020-03-12 ENCOUNTER — Other Ambulatory Visit: Payer: Self-pay | Admitting: Internal Medicine

## 2020-03-12 DIAGNOSIS — E039 Hypothyroidism, unspecified: Secondary | ICD-10-CM

## 2020-03-12 MED ORDER — LEVOTHYROXINE SODIUM 150 MCG PO TABS: 137.0000 ug | ORAL_TABLET | Freq: Every day | ORAL | 0 refills | Status: DC

## 2020-03-21 ENCOUNTER — Encounter: Payer: Self-pay | Admitting: *Deleted

## 2020-03-21 NOTE — Progress Notes (Signed)
Patient notified of results & recommendations. Expressed understanding. Scheduled lab appointment to repeat TSH on 05/02/2020.

## 2020-03-31 ENCOUNTER — Other Ambulatory Visit: Payer: Self-pay

## 2020-03-31 ENCOUNTER — Ambulatory Visit (INDEPENDENT_AMBULATORY_CARE_PROVIDER_SITE_OTHER): Payer: BC Managed Care – PPO

## 2020-03-31 DIAGNOSIS — Z23 Encounter for immunization: Secondary | ICD-10-CM | POA: Diagnosis not present

## 2020-03-31 NOTE — Progress Notes (Signed)
Patient here for flu shot. KWalker, CMA.

## 2020-05-02 ENCOUNTER — Other Ambulatory Visit: Payer: BC Managed Care – PPO

## 2020-06-04 ENCOUNTER — Other Ambulatory Visit: Payer: Self-pay | Admitting: Internal Medicine

## 2020-06-04 DIAGNOSIS — E039 Hypothyroidism, unspecified: Secondary | ICD-10-CM

## 2020-06-09 ENCOUNTER — Telehealth: Payer: BC Managed Care – PPO | Admitting: Internal Medicine

## 2020-06-11 ENCOUNTER — Other Ambulatory Visit (INDEPENDENT_AMBULATORY_CARE_PROVIDER_SITE_OTHER): Payer: Medicare Other

## 2020-06-11 ENCOUNTER — Other Ambulatory Visit: Payer: Self-pay

## 2020-06-11 DIAGNOSIS — E039 Hypothyroidism, unspecified: Secondary | ICD-10-CM

## 2020-06-12 LAB — TSH+T4F+T3FREE
Free T4: 1.28 ng/dL (ref 0.82–1.77)
T3, Free: 2.8 pg/mL (ref 2.0–4.4)
TSH: 3.37 u[IU]/mL (ref 0.450–4.500)

## 2020-06-12 NOTE — Progress Notes (Signed)
Normal lab letter mailed.

## 2020-06-25 ENCOUNTER — Encounter: Payer: Self-pay | Admitting: Internal Medicine

## 2020-06-25 ENCOUNTER — Ambulatory Visit (INDEPENDENT_AMBULATORY_CARE_PROVIDER_SITE_OTHER): Payer: BC Managed Care – PPO | Admitting: Internal Medicine

## 2020-06-25 ENCOUNTER — Other Ambulatory Visit: Payer: Self-pay

## 2020-06-25 VITALS — BP 128/85 | HR 65 | Temp 97.9°F | Ht 72.0 in | Wt 255.0 lb

## 2020-06-25 DIAGNOSIS — E785 Hyperlipidemia, unspecified: Secondary | ICD-10-CM | POA: Diagnosis not present

## 2020-06-25 DIAGNOSIS — I10 Essential (primary) hypertension: Secondary | ICD-10-CM

## 2020-06-25 DIAGNOSIS — E119 Type 2 diabetes mellitus without complications: Secondary | ICD-10-CM | POA: Diagnosis not present

## 2020-06-25 LAB — POCT GLYCOSYLATED HEMOGLOBIN (HGB A1C): Hemoglobin A1C: 9.1 % — AB (ref 4.0–5.6)

## 2020-06-25 NOTE — Progress Notes (Signed)
Subjective:    Colin Ramirez - 54 y.o. male MRN [id number]  Date of birth: [birth year]  HPI  Colin Ramirez is here for follow up of chronic medical conditions.  Diabetes mellitus, Type 2 Disease Monitoring             Blood Sugar Ranges: Not monitoring              Polyuria: no             Visual problems: no   Urine Microalbumin 134 (Dec 2020) --on Ace   Last A1C: 8.5 (Sept 2021)   Medications: Farxiga 10 mg, Metformin 1000 mg BID   Medication Compliance: yes  Medication Side Effects             Hypoglycemia: no   Chronic HTN Disease Monitoring:  Home BP Monitoring - 120s/80s  Chest pain- no  Dyspnea- no Headache - no  Medications: Amlodipine 10 mg, Coreg 6.25 mg BID, HCTZ 25 mg, Ramipril 10 mg   Compliance- yes Lightheadedness- no  Edema- no      Health Maintenance:  Health Maintenance Due  Topic Date Due   OPHTHALMOLOGY EXAM  08/09/2019    -  reports that he has been smoking cigarettes. He has a 30.00 pack-year smoking history. He has never used smokeless tobacco. - Review of Systems: Per HPI. - Past Medical History: Patient Active Problem List   Diagnosis Date Noted   Hx of adenomatous colonic polyps 09/10/2019   Type 2 diabetes mellitus (HCC) 08/30/2019   Sigmoid diverticulitis 07/18/2019   Facial twitching 06/07/2018   Atherosclerosis of native coronary artery with angina pectoris (HCC) 10/28/2017   Bilateral hearing loss 04/19/2017   Unilateral primary osteoarthritis, left hip 08/20/2016   Status post left hip replacement 08/20/2016   Hypothyroidism 08/19/2012   Degenerative disc disease 12/02/2011   Tobacco abuse 12/02/2011   HTN (hypertension) 11/08/2011   Hyperlipidemia 11/08/2011   GERD (gastroesophageal reflux disease) 11/08/2011   Sleep apnea 11/08/2011   - Medications: reviewed and updated   Objective:   Physical Exam BP 128/85 (BP Location: Left Arm, Patient Position: Sitting, Cuff Size: Large)     Pulse 65    Temp 97.9 F (36.6 C) (Oral)    Ht 6' (1.829 m)    Wt 255 lb (115.7 kg)    SpO2 94%    BMI 34.58 kg/m  Physical Exam Constitutional:      General: He is not in acute distress.    Appearance: He is not diaphoretic.  HENT:     Head: Normocephalic and atraumatic.  Eyes:     Extraocular Movements: EOM normal.     Conjunctiva/sclera: Conjunctivae normal.  Cardiovascular:     Rate and Rhythm: Normal rate and regular rhythm.     Heart sounds: Normal heart sounds. No murmur heard.   Pulmonary:     Effort: Pulmonary effort is normal. No respiratory distress.     Breath sounds: Normal breath sounds.  Musculoskeletal:        General: Normal range of motion.  Skin:    General: Skin is warm and dry.  Neurological:     Mental Status: He is alert and oriented to person, place, and time.  Psychiatric:        Mood and Affect: Affect normal.        Judgment: Judgment normal.            Assessment & Plan:   1. Type 2 diabetes mellitus without  complication, without long-term current use of insulin (HCC) A1c continues to show uncontrolled trend with result of 9.1%. Patient is aware dietary indiscretions are contributing, but has been unable to make lifestyle changes. Discussed with patient that we are at point where I strongly believe he would benefit from another diabetic agent. He is now amenable to trying another medication. I have scheduled patient with Benard Halsted, PharmD to discuss options as well as to work on more extensive diabetic education.  - Ambulatory referral to Ophthalmology - HgB A1c  2. Primary hypertension BP well controlled. Asymptomatic. Continue current regimen.   3. Hyperlipidemia, unspecified hyperlipidemia type LDL improved in 3 months from 116 to 72 with increase in Lipitor dose. Continue.     Phill Myron, D.O. 06/25/2020, 10:47 AM Primary Care at [facility]

## 2020-07-03 ENCOUNTER — Ambulatory Visit: Payer: Medicare Other | Admitting: Pharmacist

## 2020-07-07 ENCOUNTER — Other Ambulatory Visit: Payer: Self-pay

## 2020-07-07 DIAGNOSIS — E039 Hypothyroidism, unspecified: Secondary | ICD-10-CM

## 2020-07-07 MED ORDER — LEVOTHYROXINE SODIUM 150 MCG PO TABS: 150.0000 ug | ORAL_TABLET | Freq: Every day | ORAL | 0 refills | Status: DC

## 2020-07-08 ENCOUNTER — Telehealth: Payer: Self-pay

## 2020-07-08 NOTE — Telephone Encounter (Signed)
Contacted office about breathing machine.

## 2020-07-08 NOTE — Telephone Encounter (Signed)
Per [facility] pt needs a face-to-face visit and new order to qualify for a new CPAP machine, pls contact pt/wife to schedule 1st available w/ PCP, thanks

## 2020-07-09 ENCOUNTER — Telehealth: Payer: Self-pay

## 2020-07-09 ENCOUNTER — Other Ambulatory Visit: Payer: Self-pay | Admitting: Internal Medicine

## 2020-07-09 DIAGNOSIS — E119 Type 2 diabetes mellitus without complications: Secondary | ICD-10-CM

## 2020-07-09 DIAGNOSIS — K219 Gastro-esophageal reflux disease without esophagitis: Secondary | ICD-10-CM

## 2020-07-09 DIAGNOSIS — E785 Hyperlipidemia, unspecified: Secondary | ICD-10-CM

## 2020-07-09 NOTE — Telephone Encounter (Signed)
Contacted patient for visit with the provider to complete orders for home health equipment.

## 2020-07-15 ENCOUNTER — Ambulatory Visit: Payer: BC Managed Care – PPO | Admitting: Internal Medicine

## 2020-09-01 ENCOUNTER — Encounter: Payer: Self-pay | Admitting: Cardiology

## 2020-09-01 ENCOUNTER — Ambulatory Visit (INDEPENDENT_AMBULATORY_CARE_PROVIDER_SITE_OTHER): Payer: BC Managed Care – PPO | Admitting: Cardiology

## 2020-09-01 ENCOUNTER — Telehealth: Payer: Self-pay | Admitting: *Deleted

## 2020-09-01 ENCOUNTER — Telehealth: Payer: Self-pay | Admitting: Cardiology

## 2020-09-01 ENCOUNTER — Telehealth: Payer: Self-pay

## 2020-09-01 ENCOUNTER — Other Ambulatory Visit: Payer: Self-pay | Admitting: Internal Medicine

## 2020-09-01 ENCOUNTER — Other Ambulatory Visit: Payer: Self-pay

## 2020-09-01 VITALS — BP 124/90 | HR 83 | Ht 73.0 in | Wt 252.6 lb

## 2020-09-01 DIAGNOSIS — I25119 Atherosclerotic heart disease of native coronary artery with unspecified angina pectoris: Secondary | ICD-10-CM

## 2020-09-01 DIAGNOSIS — I10 Essential (primary) hypertension: Secondary | ICD-10-CM

## 2020-09-01 DIAGNOSIS — Z720 Tobacco use: Secondary | ICD-10-CM | POA: Diagnosis not present

## 2020-09-01 DIAGNOSIS — G4733 Obstructive sleep apnea (adult) (pediatric): Secondary | ICD-10-CM | POA: Diagnosis not present

## 2020-09-01 DIAGNOSIS — R072 Precordial pain: Secondary | ICD-10-CM

## 2020-09-01 MED ORDER — AMLODIPINE BESYLATE 10 MG PO TABS: 10.0000 mg | ORAL_TABLET | Freq: Every day | ORAL | 3 refills | Status: DC

## 2020-09-01 MED ORDER — ASPIRIN 81 MG PO TBEC: DELAYED_RELEASE_TABLET | ORAL | 3 refills | Status: DC

## 2020-09-01 NOTE — Addendum Note (Signed)
Addended by: Antonieta Iba on: 09/01/2020 02:06 PM   Modules accepted: Orders
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
## 2020-09-01 NOTE — Telephone Encounter (Signed)
Received a PA for carvedilol following RF sent to pharmacy, contacted [employer] at phone # provided and they stated to inform pt and pharmacy to use pt's secondary insurance for medication, contacted [facility] to inform of this, they voiced understanding, will run through secondary insurance and follow-up w/ any further needs, they will contact pt if any further issues w/ RF.

## 2020-09-01 NOTE — Patient Instructions (Signed)
Medication Instructions:  Your physician recommends that you continue on your current medications as directed. Please refer to the Current Medication list given to you today. *If you need a refill on your cardiac medications before your next appointment, please call your pharmacy*   Lab Work: TODAY: BMET If you have labs (blood work) drawn today and your tests are completely normal, you will receive your results only by: [name] MyChart Message (if you have MyChart) OR . A paper copy in the mail If you have any lab test that is abnormal or we need to change your treatment, we will call you to review the results.   Testing/Procedures: Your physician has requested that you have a lexiscan myoview. For further information please visit [email or web address]. Please follow instruction sheet, as given.  Your physician has recommended that you have a sleep study. This test records several body functions during sleep, including: brain activity, eye movement, oxygen and carbon dioxide blood levels, heart rate and rhythm, breathing rate and rhythm, the flow of air through your mouth and nose, snoring, body muscle movements, and chest and belly movement.  Follow-Up: At [facility], you and your health needs are our priority.  As part of our continuing mission to provide you with exceptional heart care, we have created designated Provider Care Teams.  These Care Teams include your primary Cardiologist (physician) and Advanced Practice Providers (APPs -  Physician Assistants and Nurse Practitioners) who all work together to provide you with the care you need, when you need it.  Your next appointment:   1 year(s)  The format for your next appointment:   In Person  Provider:   You may see Fransico Him, MD or one of the following Advanced Practice Providers on your designated Care Team:    Melina Copa, PA-C  Ermalinda Barrios, PA-C

## 2020-09-01 NOTE — Telephone Encounter (Signed)
Staff message sent to Colin Ramirez denied in lab sleep study. No co morbidities. Already with OSA diagnosis. Approved HST. Auth # [id number]. Valid dates 09/01/20 to 5/522.

## 2020-09-01 NOTE — Telephone Encounter (Signed)
Spoke with the patient's wife who states that the patient had an episode of chest pain Saturday evening. He took one nitroglycerin. Pain lasted for about an hour. He was short of breath as well.  He is not having any shortness of breath or chest pain today. His wife states that he is very fatigued though.  She does not have any blood pressure readings but states that when he goes to see his pain management doctor it is always high.  Patient is scheduled to see Dr. Radford Pax today at 1:40. ER precautions were reviewed with the patient's wife who verbalized understanding.

## 2020-09-01 NOTE — Telephone Encounter (Signed)
Pt c/o of Chest Pain: STAT if CP now or developed within 24 hours  1. Are you having CP right now? No   2. Are you experiencing any other symptoms (ex. SOB, nausea, vomiting, sweating)? Skin flushed   3. How long have you been experiencing CP?  Patient's wife states the patient had chest pain on the night on 08/30/20. Chest pain lasted 1 hour and he has not had another occurrence.  4. Is your CP continuous or coming and going?  CP continued for 1 hour and then went away  5. Have you taken Nitroglycerin?  Yes, patient's wife states when the patient developed the chest pain he assumed he was having a heart attack and he took a nitro. She states the nitro kicked in after 1 hour, pain was gone and patient has not had anymore chest pain.  ?

## 2020-09-02 LAB — BASIC METABOLIC PANEL
BUN/Creatinine Ratio: 15 (ref 9–20)
BUN: 15 mg/dL (ref 6–24)
CO2: 24 mmol/L (ref 20–29)
Calcium: 9.3 mg/dL (ref 8.7–10.2)
Chloride: 98 mmol/L (ref 96–106)
Creatinine, Ser: 1.03 mg/dL (ref 0.76–1.27)
Glucose: 210 mg/dL — ABNORMAL HIGH (ref 65–99)
Potassium: 3.8 mmol/L (ref 3.5–5.2)
Sodium: 141 mmol/L (ref 134–144)
eGFR: 85 mL/min/{1.73_m2} (ref >59)

## 2020-09-03 ENCOUNTER — Telehealth (HOSPITAL_COMMUNITY): Payer: Self-pay | Admitting: *Deleted

## 2020-09-03 NOTE — Telephone Encounter (Signed)
Left message on voicemail per DPR in reference to upcoming appointment scheduled on 09/10/20 at 0715 with detailed instructions given per Myocardial Perfusion Study Information Sheet for the test. LM to arrive 15 minutes early, and that it is imperative to arrive on time for appointment to keep from having the test rescheduled. If you need to cancel or reschedule your appointment, please call the office within 24 hours of your appointment. Failure to do so may result in a cancellation of your appointment, and a $50 no show fee. Phone number given for call back for any questions. Grey Rakestraw, Ranae [place]

## 2020-09-08 ENCOUNTER — Other Ambulatory Visit: Payer: Self-pay

## 2020-09-08 NOTE — Addendum Note (Signed)
Addended by: Fransico Him R on: 09/08/2020 12:49 PM   Modules accepted: Orders

## 2020-09-10 ENCOUNTER — Ambulatory Visit (HOSPITAL_COMMUNITY): Payer: BC Managed Care – PPO | Attending: Cardiology

## 2020-09-10 ENCOUNTER — Other Ambulatory Visit: Payer: Self-pay

## 2020-09-10 DIAGNOSIS — R072 Precordial pain: Secondary | ICD-10-CM | POA: Diagnosis not present

## 2020-09-10 LAB — MYOCARDIAL PERFUSION IMAGING
LV dias vol: 127 mL (ref 62–150)
LV sys vol: 52 mL
Peak HR: 76 {beats}/min
Rest HR: 60 {beats}/min
SDS: 1
SRS: 0
SSS: 1
TID: 1.1

## 2020-09-10 MED ORDER — TECHNETIUM TC 99M TETROFOSMIN IV KIT
31.5000 | PACK | Freq: Once | INTRAVENOUS | Status: AC | PRN
  Administered 2020-09-10: 31.5 via INTRAVENOUS
  Filled 2020-09-10: qty 32

## 2020-09-10 MED ORDER — REGADENOSON 0.4 MG/5ML IV SOLN
0.4000 mg | Freq: Once | INTRAVENOUS | Status: AC
  Administered 2020-09-10: 0.4 mg via INTRAVENOUS

## 2020-09-10 MED ORDER — TECHNETIUM TC 99M TETROFOSMIN IV KIT
10.6000 | PACK | Freq: Once | INTRAVENOUS | Status: AC | PRN
  Administered 2020-09-10: 10.6 via INTRAVENOUS
  Filled 2020-09-10: qty 11

## 2020-09-11 ENCOUNTER — Telehealth: Payer: Self-pay | Admitting: *Deleted

## 2020-09-11 DIAGNOSIS — G4733 Obstructive sleep apnea (adult) (pediatric): Secondary | ICD-10-CM

## 2020-09-11 NOTE — Telephone Encounter (Signed)
-----   Message from Lauralee Evener, [place] sent at 09/01/2020  3:38 PM EST ----- [employer] denied in lab sleep study. Approved HST. Patient has no comorbidities. Already has OSA diagnosis.Auth # [id number]. Valid 09/01/20 to 10/30/20. ----- Message ----- From: Antonieta Iba, RN Sent: 09/01/2020   2:11 PM EST To: Freada Bergeron, CMA, Cv Div Sleep Studies  Split night sleep study has been ordered for OSA. Thanks [employer]

## 2020-09-11 NOTE — Telephone Encounter (Signed)
Colin Margarita, MD  Freada Bergeron, CMA yes         ----- Message -----  From: Freada Bergeron, CMA  Sent: 09/10/2020  3:30 PM EDT  To: Colin Margarita, MD   Home study approved is this ok?

## 2020-09-11 NOTE — Telephone Encounter (Signed)
Patient is scheduled for HST study on 10/22/20 1:30. Patient understands his sleep study will be done at [facility] sleep lab. Patient understands he will receive a sleep packet in a week or so. Patient understands to call if he does not receive the sleep packet in a timely manner.  Left detailed message on voicemail with date and time of titration and informed patient to call back to confirm or reschedule.

## 2020-09-13 LAB — HM DIABETES EYE EXAM

## 2020-09-17 ENCOUNTER — Telehealth (INDEPENDENT_AMBULATORY_CARE_PROVIDER_SITE_OTHER): Payer: BC Managed Care – PPO | Admitting: Nurse Practitioner

## 2020-09-22 NOTE — Progress Notes (Signed)
Erroneous encounter

## 2020-09-23 ENCOUNTER — Ambulatory Visit: Payer: Medicare Other | Admitting: Internal Medicine

## 2020-10-01 ENCOUNTER — Other Ambulatory Visit: Payer: Self-pay | Admitting: Internal Medicine

## 2020-10-01 DIAGNOSIS — E039 Hypothyroidism, unspecified: Secondary | ICD-10-CM

## 2020-10-02 ENCOUNTER — Other Ambulatory Visit: Payer: Self-pay

## 2020-10-02 ENCOUNTER — Encounter: Payer: Self-pay | Admitting: Internal Medicine

## 2020-10-02 ENCOUNTER — Ambulatory Visit (INDEPENDENT_AMBULATORY_CARE_PROVIDER_SITE_OTHER): Payer: BC Managed Care – PPO | Admitting: Internal Medicine

## 2020-10-02 VITALS — BP 129/88 | HR 76 | Ht 72.99 in | Wt 246.4 lb

## 2020-10-02 DIAGNOSIS — I10 Essential (primary) hypertension: Secondary | ICD-10-CM

## 2020-10-02 DIAGNOSIS — E119 Type 2 diabetes mellitus without complications: Secondary | ICD-10-CM

## 2020-10-02 NOTE — Progress Notes (Signed)
Subjective:    Colin Ramirez - 55 y.o. male MRN [id number]  Date of birth: 04-07-1966  HPI  JAVIER GELL is here for follow up of chronic medical conditions.   Diabetes mellitus, Type 2 Disease Monitoring             Blood Sugar Ranges: Not monitoring              Polyuria: no             Visual problems: no   Urine Microalbumin 134 (Dec 2020) --on Ace   Last A1C: 9.1 (Dec 2021)   Medications: Farxiga 10 mg, Metformin 1000 mg BID  Medication Compliance: yes  Medication Side Effects             Hypoglycemia: no   Chronic HTN Disease Monitoring:  Home BP Monitoring - 120/80s  Chest pain- no  Dyspnea- no Headache - no  Medications: Amlodipine 10 mg, HCTZ 25 mg, Ramipril 10 mg, Coreg 6.25 mg BID  Compliance- yes Lightheadedness- no  Edema- no         Health Maintenance:  There are no preventive care reminders to display for this patient.  -  reports that he has been smoking cigarettes. He has a 30.00 pack-year smoking history. He has never used smokeless tobacco. - Review of Systems: Per HPI. - Past Medical History: Patient Active Problem List   Diagnosis Date Noted  . Hx of adenomatous colonic polyps 09/10/2019  . Type 2 diabetes mellitus ([place]) 08/30/2019  . Sigmoid diverticulitis 07/18/2019  . Facial twitching 06/07/2018  . Atherosclerosis of native coronary artery with angina pectoris ([place]) 10/28/2017  . Bilateral hearing loss 04/19/2017  . Unilateral primary osteoarthritis, left hip 08/20/2016  . Status post left hip replacement 08/20/2016  . Hypothyroidism 08/19/2012  . Degenerative disc disease 12/02/2011  . Tobacco abuse 12/02/2011  . HTN (hypertension) 11/08/2011  . Hyperlipidemia 11/08/2011  . GERD (gastroesophageal reflux disease) 11/08/2011  . Sleep apnea 11/08/2011   - Medications: reviewed and updated   Objective:   Physical Exam BP 129/88 (BP Location: Left Arm, Patient Position: Sitting)   Pulse 76   Ht 6'  0.99" (1.854 m)   Wt 246 lb 6.4 oz (111.8 kg)   SpO2 96%   BMI 32.52 kg/m  Physical Exam Constitutional:      General: He is not in acute distress.    Appearance: He is not diaphoretic.  Cardiovascular:     Rate and Rhythm: Normal rate.  Pulmonary:     Effort: Pulmonary effort is normal. No respiratory distress.  Musculoskeletal:        General: Normal range of motion.  Skin:    General: Skin is warm and dry.  Neurological:     Mental Status: He is alert and oriented to person, place, and time.  Psychiatric:        Mood and Affect: Affect normal.        Judgment: Judgment normal.            Assessment & Plan:   1. Primary hypertension BP well controlled. Asymptomatic. Continue current regimen.   2. Type 2 diabetes mellitus without complication, without long-term current use of insulin (HCC) We discussed that A1c has worsened from 9.1>10.5 in the past 3 months. He did not follow up with Benard Halsted, PharmD as planned. Discussed that I would strongly advise we start insulin at this juncture. He would like to try to avoid insulin injections. Reports it  is not a lack of understanding/education on his part but a lack of compliance to diet. Endorses drinking significant amount of soda and juices. He is adamant that he will try to get his diet choices under control. Agrees that if A1c not improved in 3 months, he will start insulin.   Phill Myron, D.O. 10/02/2020, 1:48 PM Primary Care at [facility]

## 2020-10-02 NOTE — Progress Notes (Signed)
Diabetes/HTN f/u

## 2020-10-06 ENCOUNTER — Other Ambulatory Visit: Payer: Self-pay | Admitting: Internal Medicine

## 2020-10-06 DIAGNOSIS — I10 Essential (primary) hypertension: Secondary | ICD-10-CM

## 2020-10-22 ENCOUNTER — Ambulatory Visit (HOSPITAL_BASED_OUTPATIENT_CLINIC_OR_DEPARTMENT_OTHER): Payer: BC Managed Care – PPO | Attending: Cardiology | Admitting: Cardiology

## 2020-10-22 ENCOUNTER — Other Ambulatory Visit: Payer: Self-pay

## 2020-10-22 DIAGNOSIS — G4733 Obstructive sleep apnea (adult) (pediatric): Secondary | ICD-10-CM | POA: Diagnosis present

## 2020-10-22 DIAGNOSIS — R0902 Hypoxemia: Secondary | ICD-10-CM | POA: Diagnosis not present

## 2020-10-22 DIAGNOSIS — G4736 Sleep related hypoventilation in conditions classified elsewhere: Secondary | ICD-10-CM | POA: Insufficient documentation

## 2020-10-23 NOTE — Procedures (Signed)
  Patient Name: Colin Ramirez, Colin Ramirez Date: 10/22/2020 Gender: Male D.O.B: 11/21/1965 Age (years): 37 Referring Provider: Fransico Him MD, ABSM Height (inches): 53 Interpreting Physician: Fransico Him MD, ABSM Weight (lbs): 246 RPSGT: Jacolyn Reedy BMI: 32 MRN: [id number] Neck Size: 17.50  CLINICAL INFORMATION Sleep Study Type: HST  Indication for sleep study: N/A  Epworth Sleepiness Score: 15  SLEEP STUDY TECHNIQUE A multi-channel overnight portable sleep study was performed. The channels recorded were: nasal airflow, thoracic respiratory movement, and oxygen saturation with a pulse oximetry. Snoring was also monitored.  MEDICATIONS Patient self administered medications include: N/A.  SLEEP ARCHITECTURE Patient was studied for 350.2 minutes. The sleep efficiency was 100.0 % and the patient was supine for 85.6%. The arousal index was 0.0 per hour.  RESPIRATORY PARAMETERS The overall AHI was 73.2 per hour, with a central apnea index of 0 per hour.  The oxygen nadir was 81% during sleep.  CARDIAC DATA Mean heart rate during sleep was 64.4 bpm.  IMPRESSIONS Severe obstructive sleep apnea occurred during this study (AHI = 73.2/h). Moderate oxygen desaturation was noted during this study (Min O2 = 81%). Patient snored 1.0% during the sleep.  DIAGNOSIS Obstructive Sleep Apnea (G47.33) Nocturnal Hypoxemia (G47.36)  RECOMMENDATIONS Avoid alcohol, sedatives and other CNS depressants that may worsen sleep apnea and disrupt normal sleep architecture. Sleep hygiene should be reviewed to assess factors that may improve sleep quality. Weight management and regular exercise should be initiated or continued. Return to Sleep Center to discuss the results of this study Patient may benefit from in-lab study  [Electronically signed] 10/23/2020 03:59 PM  Fransico Him MD, ABSM Diplomate, American Board of Sleep Medicine

## 2020-10-27 ENCOUNTER — Telehealth: Payer: Self-pay | Admitting: *Deleted

## 2020-10-27 DIAGNOSIS — G4733 Obstructive sleep apnea (adult) (pediatric): Secondary | ICD-10-CM

## 2020-10-27 NOTE — Telephone Encounter (Addendum)
Informed patient of sleep study results and patient understanding was verbalized. Patient understands his sleep study showed they have sleep apnea and recommend CPAP titration. Please set up titration in the sleep lab. He has comorbidities including BMI > 32, DM,   Pt is aware of his results Titration sent to sleep pool.  Patient states he had the sleep study so he could get a new machine. He is not interested in going back to the lab for a titration. Per your note on 09/01/20: OSA  -he is on CPAP and has a very old machine>>his old machine is over 53 years old -I will get a split night sleep study

## 2020-10-27 NOTE — Telephone Encounter (Signed)
-----   Message from Sueanne Margarita, MD sent at 10/23/2020  4:02 PM EDT ----- Please let patient know that they have sleep apnea and recommend CPAP titration. Please set up titration in the sleep lab. He has comorbidities including BMI > 32, DM,

## 2020-10-28 NOTE — Telephone Encounter (Signed)
Patient agrees to go back to the lab for bipap titration. Message sent to sleep pool for pre-.cert

## 2020-10-29 ENCOUNTER — Telehealth: Payer: Self-pay | Admitting: *Deleted

## 2020-10-29 NOTE — Telephone Encounter (Signed)
Called insurance to do PA for titration. Could not do it due to the lab scheduling into July. The insurance cannot go greater than 30 days out. I will need to call back the beginning of June per the CS rep.

## 2020-11-06 ENCOUNTER — Telehealth: Payer: Self-pay | Admitting: Internal Medicine

## 2020-11-06 ENCOUNTER — Other Ambulatory Visit: Payer: Self-pay | Admitting: Nurse Practitioner

## 2020-11-06 DIAGNOSIS — E119 Type 2 diabetes mellitus without complications: Secondary | ICD-10-CM

## 2020-11-06 NOTE — Telephone Encounter (Addendum)
Attempt to call patient to inform of the following. Left voicemail to return call. 2nd number does not work.  Patient needs labs for an A1c prior to getting a refill. He had an OV in April but not labs for A1C since 2021.   Will need an order.

## 2020-11-10 ENCOUNTER — Telehealth: Payer: Self-pay | Admitting: Internal Medicine

## 2020-11-10 NOTE — Telephone Encounter (Signed)
Called pt left message RX was sent in March by Dr Juleen China.

## 2020-11-10 NOTE — Telephone Encounter (Signed)
Pt called in stating he is needing a new CPAP machine and needs an order. Pt states he uses [facility]. Pt would like to be able to get the CPAP machine before the end of the month, because his insurance will run out.

## 2020-11-11 ENCOUNTER — Other Ambulatory Visit: Payer: Self-pay | Admitting: Internal Medicine

## 2020-11-11 DIAGNOSIS — E119 Type 2 diabetes mellitus without complications: Secondary | ICD-10-CM

## 2020-11-11 NOTE — Telephone Encounter (Signed)
LVM- needs to schedule appt for lab test to get refills on medication.

## 2020-11-11 NOTE — Telephone Encounter (Signed)
Have placed future order for lab.   Phill Myron, D.O. Primary Care at [facility]  11/11/2020, 9:24 AM

## 2020-11-11 NOTE — Telephone Encounter (Signed)
Pt spouse Attilio Zeitler called stating the order in March was for reservoir and the hoses and that was picked up. He needs a brand new CPAP breathing machine. Pt is insurance eligible and it has been over 8 years since he has had a new machine. The order needs to be sent to [facility] phone [phone] fax [phone] address [address] [place], [place] [address]. This is imperative that this gets processed because his insurance will run out at the end of May 2022. Please submit order ASAP. Please contact [name] (spouse) at  [phone] if you have any questions. Pt has already done the sleep study.

## 2020-11-12 ENCOUNTER — Other Ambulatory Visit: Payer: Self-pay

## 2020-11-12 ENCOUNTER — Other Ambulatory Visit: Payer: BC Managed Care – PPO

## 2020-11-12 DIAGNOSIS — E119 Type 2 diabetes mellitus without complications: Secondary | ICD-10-CM

## 2020-11-12 NOTE — Telephone Encounter (Signed)
It looks like it was sent to [facility]. unfortunately there is a Tree surgeon of CPAP machines. there was a large recall of machines in addition to supply chain issues. it has been taking people months.... to get machines. he can call the company to see what the status of his order it but there is not really anything we can change. I dont know if sending the order to another agency would even help because they are all experiencing the same problems/.  The above message is from Eden Lathe, RN Case Manager for [facility].

## 2020-11-12 NOTE — Progress Notes (Signed)
Pt here for A1c check

## 2020-11-12 NOTE — Telephone Encounter (Signed)
Pt is coming in today 5.18.2022 at 1:30pm for labwork. Pt is asking if his medication will be called in today since he is out of it?

## 2020-11-12 NOTE — Telephone Encounter (Signed)
Awaiting results for labs that were sent out today. For possible  Dosage changes.  Will route to PCP if otherwise suggested.

## 2020-11-13 ENCOUNTER — Telehealth (INDEPENDENT_AMBULATORY_CARE_PROVIDER_SITE_OTHER): Payer: Self-pay

## 2020-11-13 ENCOUNTER — Other Ambulatory Visit: Payer: Self-pay | Admitting: Internal Medicine

## 2020-11-13 DIAGNOSIS — E119 Type 2 diabetes mellitus without complications: Secondary | ICD-10-CM

## 2020-11-13 LAB — HEMOGLOBIN A1C
Est. average glucose Bld gHb Est-mCnc: 212 mg/dL
Hgb A1c MFr Bld: 9 % — ABNORMAL HIGH (ref 4.8–5.6)

## 2020-11-13 MED ORDER — METFORMIN HCL ER 500 MG PO TB24: 1000.0000 mg | ORAL_TABLET | Freq: Two times a day (BID) | ORAL | 3 refills | Status: DC

## 2020-11-13 MED ORDER — DAPAGLIFLOZIN PROPANEDIOL 10 MG PO TABS
10.0000 mg | ORAL_TABLET | Freq: Every day | ORAL | 1 refills | Status: DC
Start: 2020-11-13 — End: 2021-02-10

## 2020-11-13 NOTE — Telephone Encounter (Signed)
-----   Message from Nicolette Bang, DO sent at 11/13/2020 10:06 AM EDT ----- Please call Leanne Lovely about results. A1c is still above goal with result of 9. I have refilled his diabetic medications. Would strongly encourage him to consider additional medication for DM control at this point. Please have him f/u with Benard Halsted, PharmD if he is willing to discuss other options.   Thanks,  Phill Myron, D.O. Primary Care at [facility]  11/13/2020, 10:03 AM

## 2020-11-13 NOTE — Telephone Encounter (Signed)
Patient verified date of birth. He is aware that a1c is above goal. He has scheduled to see PharmD luke. Nat Christen, CMA

## 2020-11-14 NOTE — Telephone Encounter (Signed)
Patient informed of the message from Carlis Abbott, RN Case Manager

## 2020-11-27 ENCOUNTER — Ambulatory Visit: Payer: BC Managed Care – PPO | Admitting: Pharmacist

## 2020-12-11 NOTE — Telephone Encounter (Signed)
Prior Authorization for CPAP titration study sent to [facility] via Phone.  Auth received. Auth # [id number].Valid dates 12/11/20 to 02/08/21.

## 2020-12-25 NOTE — Telephone Encounter (Signed)
Patient is scheduled for lab study on 02/26/20. Patient understands his sleep study will be done at [facility] sleep lab. Patient understands he will receive a sleep packet in a week or so. Patient understands to call if he does not receive the sleep packet in a timely manner. Patient agrees with treatment and thanked me for call.

## 2021-02-06 ENCOUNTER — Encounter (HOSPITAL_BASED_OUTPATIENT_CLINIC_OR_DEPARTMENT_OTHER): Payer: BC Managed Care – PPO | Admitting: Cardiology

## 2021-02-10 ENCOUNTER — Other Ambulatory Visit: Payer: Self-pay

## 2021-02-10 ENCOUNTER — Ambulatory Visit: Payer: Medicare Other | Admitting: Physician Assistant

## 2021-02-10 VITALS — BP 145/82 | HR 60 | Temp 98.2°F | Resp 18 | Ht 73.0 in | Wt 256.0 lb

## 2021-02-10 DIAGNOSIS — E6609 Other obesity due to excess calories: Secondary | ICD-10-CM

## 2021-02-10 DIAGNOSIS — Z6833 Body mass index (BMI) 33.0-33.9, adult: Secondary | ICD-10-CM

## 2021-02-10 DIAGNOSIS — E119 Type 2 diabetes mellitus without complications: Secondary | ICD-10-CM | POA: Diagnosis not present

## 2021-02-10 DIAGNOSIS — E039 Hypothyroidism, unspecified: Secondary | ICD-10-CM | POA: Diagnosis not present

## 2021-02-10 DIAGNOSIS — K219 Gastro-esophageal reflux disease without esophagitis: Secondary | ICD-10-CM

## 2021-02-10 DIAGNOSIS — E785 Hyperlipidemia, unspecified: Secondary | ICD-10-CM

## 2021-02-10 DIAGNOSIS — L309 Dermatitis, unspecified: Secondary | ICD-10-CM

## 2021-02-10 DIAGNOSIS — G4733 Obstructive sleep apnea (adult) (pediatric): Secondary | ICD-10-CM

## 2021-02-10 DIAGNOSIS — I10 Essential (primary) hypertension: Secondary | ICD-10-CM

## 2021-02-10 DIAGNOSIS — R600 Localized edema: Secondary | ICD-10-CM

## 2021-02-10 DIAGNOSIS — Z125 Encounter for screening for malignant neoplasm of prostate: Secondary | ICD-10-CM

## 2021-02-10 LAB — POCT GLYCOSYLATED HEMOGLOBIN (HGB A1C): Hemoglobin A1C: 8.7 % — AB (ref 4.0–5.6)

## 2021-02-10 MED ORDER — METFORMIN HCL ER 500 MG PO TB24: 1000.0000 mg | ORAL_TABLET | Freq: Two times a day (BID) | ORAL | 3 refills | Status: DC

## 2021-02-10 MED ORDER — ESOMEPRAZOLE MAGNESIUM 40 MG PO CPDR: 40.0000 mg | DELAYED_RELEASE_CAPSULE | Freq: Every day | ORAL | 1 refills | Status: DC

## 2021-02-10 MED ORDER — HYDROCHLOROTHIAZIDE 50 MG PO TABS: 50.0000 mg | ORAL_TABLET | Freq: Every day | ORAL | 1 refills | Status: DC

## 2021-02-10 MED ORDER — DAPAGLIFLOZIN PROPANEDIOL 10 MG PO TABS: 10.0000 mg | ORAL_TABLET | Freq: Every day | ORAL | 1 refills | Status: DC

## 2021-02-10 MED ORDER — LEVOTHYROXINE SODIUM 150 MCG PO TABS: 150.0000 ug | ORAL_TABLET | Freq: Every day | ORAL | 1 refills | Status: DC

## 2021-02-10 MED ORDER — CARVEDILOL 6.25 MG PO TABS: 6.2500 mg | ORAL_TABLET | Freq: Two times a day (BID) | ORAL | 1 refills | Status: DC

## 2021-02-10 MED ORDER — AMLODIPINE BESYLATE 10 MG PO TABS: 10.0000 mg | ORAL_TABLET | Freq: Every day | ORAL | 3 refills | Status: DC

## 2021-02-10 MED ORDER — RAMIPRIL 10 MG PO CAPS: 10.0000 mg | ORAL_CAPSULE | Freq: Every day | ORAL | 1 refills | Status: DC

## 2021-02-10 MED ORDER — TRIAMCINOLONE ACETONIDE 0.1 % EX CREA: 1.0000 "application " | TOPICAL_CREAM | Freq: Two times a day (BID) | CUTANEOUS | 0 refills | Status: DC

## 2021-02-10 MED ORDER — ATORVASTATIN CALCIUM 80 MG PO TABS: 80.0000 mg | ORAL_TABLET | Freq: Every day | ORAL | 1 refills | Status: DC

## 2021-02-10 NOTE — Progress Notes (Signed)
Patient has not eaten today. Patient has taken medication today. Patient reports chronic pain at a 4. Patient reports trouble with soft drinks and bread and having to trade out for water. Patient reports poison oak exposure post 1 week on the right leg.
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
## 2021-02-10 NOTE — Patient Instructions (Signed)
For your elevated blood pressure, you are going to now start taking 50 mg of hydrochlorothiazide in addition to your previous medications.  I encourage you to check your blood pressure on a daily basis, keep a written log and have available for your [facility]  I also encourage you to work on a low sugar diet, check your blood sugar on a daily basis, keep a written log and have available for your appointment with the [facility].  The 2 medications that I spoke about are Trulicity and Ozempic.  I encourage you to consider adding those medications to your regimen.  You will use Kenalog to help with your rash.  I started a referral for you to have a follow-up for your sleep study.  We will call you with today's lab results.  Kennieth Rad, PA-C Physician Assistant [facility] [email or web address]   https://www.diabeteseducator.org/docs/default-source/living-with-diabetes/conquering-the-grocery-store-v1.pdf?sfvrsn=4">  Carbohydrate Counting for Diabetes Mellitus, Adult Carbohydrate counting is a method of keeping track of how many carbohydrates you eat. Eating carbohydrates naturally increases the amount of sugar (glucose) in the blood. Counting how many carbohydrates you eat improves your bloodglucose control, which helps you manage your diabetes. It is important to know how many carbohydrates you can safely have in each meal. This is different for every person. A dietitian can help you make a meal plan and calculate how many carbohydrates you should have at each meal andsnack. What foods contain carbohydrates? Carbohydrates are found in the following foods: Grains, such as breads and cereals. Dried beans and soy products. Starchy vegetables, such as potatoes, peas, and corn. Fruit and fruit juices. Milk and  yogurt. Sweets and snack foods, such as cake, cookies, candy, chips, and soft drinks. How do I count carbohydrates in foods? There are two ways to count carbohydrates in food. You can read food labels or learn standard serving sizes of foods. You can use either of the methods or acombination of both. Using the Nutrition Facts label The Nutrition Facts list is included on the labels of almost all packaged foods and beverages in the [place] It includes: The serving size. Information about nutrients in each serving, including the grams (g) of carbohydrate per serving. To use the Nutrition Facts: Decide how many servings you will have. Multiply the number of servings by the number of carbohydrates per serving. The resulting number is the total amount of carbohydrates that you will be having. Learning the standard serving sizes of foods When you eat carbohydrate foods that are not packaged or do not include Nutrition Facts on the label, you need to measure the servings in order to count the amount of carbohydrates. Measure the foods that you will eat with a food scale or measuring cup, if needed. Decide how many standard-size servings you will eat. Multiply the number of servings by 15. For foods that contain carbohydrates, one serving equals 15 g of carbohydrates. For example, if you eat 2 cups or 10 oz (300 g) of strawberries, you will have eaten 2 servings and 30 g of carbohydrates (2 servings x 15 g = 30 g). For foods that have more than one food mixed, such as soups and casseroles, you must count the carbohydrates in each food that is included. The following list contains standard serving sizes of common carbohydrate-rich foods. Each of these servings has about 15 g of carbohydrates: 1 slice of bread. 1 six-inch (15 cm) tortilla. ?  cup or 2 oz (53 g) cooked rice or pasta.  cup or 3 oz (85 g) cooked or canned, drained and rinsed beans or lentils.  cup or 3 oz (85 g) starchy vegetable, such as  peas, corn, or squash.  cup or 4 oz (120 g) hot cereal.  cup or 3 oz (85 g) boiled or mashed potatoes, or  or 3 oz (85 g) of a large baked potato.  cup or 4 fl oz (118 mL) fruit juice. 1 cup or 8 fl oz (237 mL) milk. 1 small or 4 oz (106 g) apple.  or 2 oz (63 g) of a medium banana. 1 cup or 5 oz (150 g) strawberries. 3 cups or 1 oz (24 g) popped popcorn. What is an example of carbohydrate counting? To calculate the number of carbohydrates in this sample meal, follow the stepsshown below. Sample meal 3 oz (85 g) chicken breast. ? cup or 4 oz (106 g) brown rice.  cup or 3 oz (85 g) corn. 1 cup or 8 fl oz (237 mL) milk. 1 cup or 5 oz (150 g) strawberries with sugar-free whipped topping. Carbohydrate calculation Identify the foods that contain carbohydrates: Rice. Corn. Milk. Strawberries. Calculate how many servings you have of each food: 2 servings rice. 1 serving corn. 1 serving milk. 1 serving strawberries. Multiply each number of servings by 15 g: 2 servings rice x 15 g = 30 g. 1 serving corn x 15 g = 15 g. 1 serving milk x 15 g = 15 g. 1 serving strawberries x 15 g = 15 g. Add together all of the amounts to find the total grams of carbohydrates eaten: 30 g + 15 g + 15 g + 15 g = 75 g of carbohydrates total. What are tips for following this plan? Shopping Develop a meal plan and then make a shopping list. Buy fresh and frozen vegetables, fresh and frozen fruit, dairy, eggs, beans, lentils, and whole grains. Look at food labels. Choose foods that have more fiber and less sugar. Avoid processed foods and foods with added sugars. Meal planning Aim to have the same amount of carbohydrates at each meal and for each snack time. Plan to have regular, balanced meals and snacks. Where to find more information American Diabetes Association: www.diabetes.org Centers for Disease Control and Prevention: http://www.wolf.info/ Summary Carbohydrate counting is a method of keeping track  of how many carbohydrates you eat. Eating carbohydrates naturally increases the amount of sugar (glucose) in the blood. Counting how many carbohydrates you eat improves your blood glucose control, which helps you manage your diabetes. A dietitian can help you make a meal plan and calculate how many carbohydrates you should have at each meal and snack. This information is not intended to replace advice given to you by your health care provider. Make sure you discuss any questions you have with your healthcare provider. Document Revised: 06/14/2019 Document Reviewed: 06/15/2019 Elsevier Patient Education  2021 [employer].

## 2021-02-11 DIAGNOSIS — L309 Dermatitis, unspecified: Secondary | ICD-10-CM | POA: Insufficient documentation

## 2021-02-11 DIAGNOSIS — R600 Localized edema: Secondary | ICD-10-CM | POA: Insufficient documentation

## 2021-02-11 LAB — BRAIN NATRIURETIC PEPTIDE: BNP: 177.4 pg/mL — ABNORMAL HIGH (ref 0.0–100.0)

## 2021-02-11 LAB — COMP. METABOLIC PANEL (12)
AST: 19 IU/L (ref 0–40)
Albumin/Globulin Ratio: 2.5 — ABNORMAL HIGH (ref 1.2–2.2)
Albumin: 4.3 g/dL (ref 3.8–4.9)
Alkaline Phosphatase: 61 IU/L (ref 44–121)
BUN/Creatinine Ratio: 17 (ref 9–20)
BUN: 10 mg/dL (ref 6–24)
Bilirubin Total: 0.3 mg/dL (ref 0.0–1.2)
Calcium: 8.9 mg/dL (ref 8.7–10.2)
Chloride: 102 mmol/L (ref 96–106)
Creatinine, Ser: 0.58 mg/dL — ABNORMAL LOW (ref 0.76–1.27)
Globulin, Total: 1.7 g/dL (ref 1.5–4.5)
Glucose: 164 mg/dL — ABNORMAL HIGH (ref 65–99)
Potassium: 3.9 mmol/L (ref 3.5–5.2)
Sodium: 142 mmol/L (ref 134–144)
Total Protein: 6 g/dL (ref 6.0–8.5)
eGFR: 114 mL/min/{1.73_m2} (ref >59)

## 2021-02-11 LAB — MICROALBUMIN / CREATININE URINE RATIO
Creatinine, Urine: 75.7 mg/dL
Microalb/Creat Ratio: 110 mg/g creat — ABNORMAL HIGH (ref 0–29)
Microalbumin, Urine: 83 ug/mL

## 2021-02-11 LAB — LIPID PANEL
Chol/HDL Ratio: 4.2 ratio (ref 0.0–5.0)
Cholesterol, Total: 143 mg/dL (ref 100–199)
HDL: 34 mg/dL — ABNORMAL LOW (ref >39)
LDL Chol Calc (NIH): 79 mg/dL (ref 0–99)
Triglycerides: 176 mg/dL — ABNORMAL HIGH (ref 0–149)
VLDL Cholesterol Cal: 30 mg/dL (ref 5–40)

## 2021-02-11 LAB — CBC WITH DIFFERENTIAL/PLATELET
Basophils Absolute: 0.1 10*3/uL (ref 0.0–0.2)
Basos: 1 %
EOS (ABSOLUTE): 0.3 10*3/uL (ref 0.0–0.4)
Eos: 3 %
Hematocrit: 45.9 % (ref 37.5–51.0)
Hemoglobin: 15.6 g/dL (ref 13.0–17.7)
Immature Grans (Abs): 0 10*3/uL (ref 0.0–0.1)
Immature Granulocytes: 1 %
Lymphocytes Absolute: 1.6 10*3/uL (ref 0.7–3.1)
Lymphs: 18 %
MCH: 30.1 pg (ref 26.6–33.0)
MCHC: 34 g/dL (ref 31.5–35.7)
MCV: 88 fL (ref 79–97)
Monocytes Absolute: 0.6 10*3/uL (ref 0.1–0.9)
Monocytes: 7 %
Neutrophils Absolute: 6.2 10*3/uL (ref 1.4–7.0)
Neutrophils: 70 %
Platelets: 256 10*3/uL (ref 150–450)
RBC: 5.19 x10E6/uL (ref 4.14–5.80)
RDW: 14.8 % (ref 11.6–15.4)
WBC: 8.9 10*3/uL (ref 3.4–10.8)

## 2021-02-11 LAB — THYROID PANEL WITH TSH
Free Thyroxine Index: 1.8 (ref 1.2–4.9)
T3 Uptake Ratio: 31 % (ref 24–39)
T4, Total: 5.8 ug/dL (ref 4.5–12.0)
TSH: 8.09 u[IU]/mL — ABNORMAL HIGH (ref 0.450–4.500)

## 2021-02-11 LAB — PSA: Prostate Specific Ag, Serum: 0.5 ng/mL (ref 0.0–4.0)

## 2021-02-12 ENCOUNTER — Telehealth: Payer: Medicare Other | Admitting: Physician Assistant

## 2021-02-12 ENCOUNTER — Telehealth: Payer: Self-pay | Admitting: *Deleted

## 2021-02-12 ENCOUNTER — Other Ambulatory Visit: Payer: Self-pay

## 2021-02-12 ENCOUNTER — Other Ambulatory Visit: Payer: Self-pay | Admitting: Physician Assistant

## 2021-02-12 DIAGNOSIS — G4733 Obstructive sleep apnea (adult) (pediatric): Secondary | ICD-10-CM

## 2021-02-12 DIAGNOSIS — R7989 Other specified abnormal findings of blood chemistry: Secondary | ICD-10-CM

## 2021-02-12 DIAGNOSIS — E039 Hypothyroidism, unspecified: Secondary | ICD-10-CM

## 2021-02-12 DIAGNOSIS — E785 Hyperlipidemia, unspecified: Secondary | ICD-10-CM | POA: Diagnosis not present

## 2021-02-12 DIAGNOSIS — I10 Essential (primary) hypertension: Secondary | ICD-10-CM | POA: Diagnosis not present

## 2021-02-12 DIAGNOSIS — R809 Proteinuria, unspecified: Secondary | ICD-10-CM
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
## 2021-02-12 NOTE — Progress Notes (Signed)
Sleep study order placed  Kennieth Rad, PA-C Physician Assistant [place] [email or web address]

## 2021-02-12 NOTE — Telephone Encounter (Signed)
Medical Assistant left message on patient's home and cell voicemail. Voicemail states to give a call back to [place] with [facility] at [phone]. Patient needs a virtual today with provider mayers to discuss lab results and treatment.

## 2021-02-16 ENCOUNTER — Telehealth: Payer: Self-pay | Admitting: Internal Medicine

## 2021-02-16 NOTE — Telephone Encounter (Signed)
Patient called stating he was prescribed a cream for poison ivy and he states it is not helping him. Patient would like to know if he can be prescribed prednisone since that seems to have helped him before.

## 2021-02-16 NOTE — Telephone Encounter (Signed)
Please advise on patients request.

## 2021-02-17 NOTE — Telephone Encounter (Signed)
His BG levels are too high for prednisone, I encourage him to give the cream a little longer to work.

## 2021-02-18 NOTE — Telephone Encounter (Signed)
Patient verified DOB Patient is aware of prednisone not being being option for treatment at this time due to BG levels being elevated. Patient advised to use cream for another week and follow up if no relief.

## 2021-02-23 ENCOUNTER — Ambulatory Visit: Payer: Medicare Other | Admitting: Family

## 2021-02-25 ENCOUNTER — Encounter (HOSPITAL_BASED_OUTPATIENT_CLINIC_OR_DEPARTMENT_OTHER): Payer: BC Managed Care – PPO | Admitting: Cardiology

## 2021-03-16 ENCOUNTER — Ambulatory Visit: Payer: Medicare Other | Admitting: Pharmacist

## 2021-04-14 ENCOUNTER — Ambulatory Visit: Payer: Medicare Other | Admitting: Family Medicine

## 2021-05-03 ENCOUNTER — Other Ambulatory Visit: Payer: Self-pay | Admitting: Internal Medicine

## 2021-05-03 DIAGNOSIS — I10 Essential (primary) hypertension: Secondary | ICD-10-CM

## 2021-07-31 ENCOUNTER — Encounter: Payer: Self-pay | Admitting: Family Medicine

## 2021-08-13 ENCOUNTER — Other Ambulatory Visit: Payer: Self-pay | Admitting: Physician Assistant

## 2021-08-13 DIAGNOSIS — E785 Hyperlipidemia, unspecified: Secondary | ICD-10-CM

## 2021-08-13 DIAGNOSIS — K219 Gastro-esophageal reflux disease without esophagitis: Secondary | ICD-10-CM

## 2021-08-17 ENCOUNTER — Other Ambulatory Visit: Payer: Self-pay | Admitting: Physician Assistant

## 2021-08-17 DIAGNOSIS — I10 Essential (primary) hypertension: Secondary | ICD-10-CM

## 2021-08-23 ENCOUNTER — Other Ambulatory Visit: Payer: Self-pay | Admitting: Physician Assistant

## 2021-08-23 DIAGNOSIS — E039 Hypothyroidism, unspecified: Secondary | ICD-10-CM

## 2021-08-25 ENCOUNTER — Encounter: Payer: Self-pay | Admitting: Nurse Practitioner

## 2021-08-25 ENCOUNTER — Ambulatory Visit (INDEPENDENT_AMBULATORY_CARE_PROVIDER_SITE_OTHER): Payer: PRIVATE HEALTH INSURANCE | Admitting: Nurse Practitioner

## 2021-08-25 ENCOUNTER — Other Ambulatory Visit: Payer: Self-pay

## 2021-08-25 VITALS — BP 115/77 | HR 73 | Resp 18 | Ht 73.0 in | Wt 254.4 lb

## 2021-08-25 DIAGNOSIS — K219 Gastro-esophageal reflux disease without esophagitis: Secondary | ICD-10-CM

## 2021-08-25 DIAGNOSIS — I10 Essential (primary) hypertension: Secondary | ICD-10-CM

## 2021-08-25 DIAGNOSIS — E119 Type 2 diabetes mellitus without complications: Secondary | ICD-10-CM | POA: Diagnosis not present

## 2021-08-25 DIAGNOSIS — E785 Hyperlipidemia, unspecified: Secondary | ICD-10-CM

## 2021-08-25 DIAGNOSIS — E039 Hypothyroidism, unspecified: Secondary | ICD-10-CM | POA: Diagnosis not present

## 2021-08-25 DIAGNOSIS — I25119 Atherosclerotic heart disease of native coronary artery with unspecified angina pectoris: Secondary | ICD-10-CM

## 2021-08-25 MED ORDER — DAPAGLIFLOZIN PROPANEDIOL 10 MG PO TABS: 10.0000 mg | ORAL_TABLET | Freq: Every day | ORAL | 1 refills | Status: AC

## 2021-08-25 MED ORDER — RAMIPRIL 10 MG PO CAPS: 10.0000 mg | ORAL_CAPSULE | Freq: Every day | ORAL | 1 refills | Status: DC

## 2021-08-25 MED ORDER — ATORVASTATIN CALCIUM 80 MG PO TABS: 80.0000 mg | ORAL_TABLET | Freq: Every day | ORAL | 1 refills | Status: DC

## 2021-08-25 MED ORDER — METFORMIN HCL ER 500 MG PO TB24: 1000.0000 mg | ORAL_TABLET | Freq: Two times a day (BID) | ORAL | 3 refills | Status: AC

## 2021-08-25 MED ORDER — AMLODIPINE BESYLATE 10 MG PO TABS: 10.0000 mg | ORAL_TABLET | Freq: Every day | ORAL | 3 refills | Status: DC

## 2021-08-25 MED ORDER — ASPIRIN 81 MG PO TBEC: DELAYED_RELEASE_TABLET | ORAL | 3 refills | Status: AC

## 2021-08-25 MED ORDER — LEVOTHYROXINE SODIUM 150 MCG PO TABS: 150.0000 ug | ORAL_TABLET | Freq: Every day | ORAL | 1 refills | Status: DC

## 2021-08-25 MED ORDER — CARVEDILOL 6.25 MG PO TABS: 6.2500 mg | ORAL_TABLET | Freq: Two times a day (BID) | ORAL | 1 refills | Status: DC

## 2021-08-25 MED ORDER — ESOMEPRAZOLE MAGNESIUM 40 MG PO CPDR: 40.0000 mg | DELAYED_RELEASE_CAPSULE | Freq: Every day | ORAL | 1 refills | Status: AC

## 2021-08-25 MED ORDER — HYDROCHLOROTHIAZIDE 50 MG PO TABS: 50.0000 mg | ORAL_TABLET | Freq: Every day | ORAL | 2 refills | Status: DC

## 2021-08-25 NOTE — Assessment & Plan Note (Signed)
-   ramipril (ALTACE) 10 MG capsule; Take 1 capsule (10 mg total) by mouth daily.  Dispense: 90 capsule; Refill: 1 - hydrochlorothiazide (HYDRODIURIL) 50 MG tablet; Take 1 tablet (50 mg total) by mouth daily.  Dispense: 30 tablet; Refill: 2 - carvedilol (COREG) 6.25 MG tablet; Take 1 tablet (6.25 mg total) by mouth 2 (two) times daily.  Dispense: 180 tablet; Refill: 1 - amLODipine (NORVASC) 10 MG tablet; Take 1 tablet (10 mg total) by mouth daily.  Dispense: 90 tablet; Refill: 3  Low sodium diet  Stay active
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
## 2021-08-25 NOTE — Assessment & Plan Note (Signed)
-   levothyroxine (SYNTHROID) 150 MCG tablet; Take 1 tablet (150 mcg total) by mouth daily.  Dispense: 90 tablet; Refill: 1

## 2021-08-25 NOTE — Assessment & Plan Note (Signed)
atorvastatin (LIPITOR) 80 MG tablet; Take 1 tablet (80 mg total) by mouth daily.  Dispense: 90 tablet; Refill: 1  6. Atherosclerosis of native coronary artery with angina pectoris, unspecified whether native or transplanted heart (HCC)  - aspirin 81 MG EC tablet; TAKE 1 TABLET DAILY.  Dispense: 90 tablet; Refill: 3

## 2021-08-25 NOTE — Patient Instructions (Addendum)
1. Type 2 diabetes mellitus without complication, without long-term current use of insulin (HCC)  - metFORMIN (GLUCOPHAGE-XR) 500 MG 24 hr tablet; Take 2 tablets (1,000 mg total) by mouth 2 (two) times daily with a meal.  Dispense: 360 tablet; Refill: 3 - dapagliflozin propanediol (FARXIGA) 10 MG TABS tablet; Take 1 tablet (10 mg total) by mouth daily.  Dispense: 90 tablet; Refill: 1  Diabetic Diet  Stay active  2. Essential hypertension  - ramipril (ALTACE) 10 MG capsule; Take 1 capsule (10 mg total) by mouth daily.  Dispense: 90 capsule; Refill: 1 - hydrochlorothiazide (HYDRODIURIL) 50 MG tablet; Take 1 tablet (50 mg total) by mouth daily.  Dispense: 30 tablet; Refill: 2 - carvedilol (COREG) 6.25 MG tablet; Take 1 tablet (6.25 mg total) by mouth 2 (two) times daily.  Dispense: 180 tablet; Refill: 1 - amLODipine (NORVASC) 10 MG tablet; Take 1 tablet (10 mg total) by mouth daily.  Dispense: 90 tablet; Refill: 3  Low sodium diet  Stay active  3. Hypothyroidism, unspecified type  - levothyroxine (SYNTHROID) 150 MCG tablet; Take 1 tablet (150 mcg total) by mouth daily.  Dispense: 90 tablet; Refill: 1   4. Gastroesophageal reflux disease, unspecified whether esophagitis present  - esomeprazole (NEXIUM) 40 MG capsule; Take 1 capsule (40 mg total) by mouth daily.  Dispense: 90 capsule; Refill: 1  5. Hyperlipidemia, unspecified hyperlipidemia type  - atorvastatin (LIPITOR) 80 MG tablet; Take 1 tablet (80 mg total) by mouth daily.  Dispense: 90 tablet; Refill: 1  6. Atherosclerosis of native coronary artery with angina pectoris, unspecified whether native or transplanted heart (HCC)  - aspirin 81 MG EC tablet; TAKE 1 TABLET DAILY.  Dispense: 90 tablet; Refill: 3   Orders Placed This Encounter  Procedures   Hemoglobin A1c   CBC   Comprehensive metabolic panel     Follow up:  Follow up with PCP in 3 months or sooner if needed

## 2021-08-25 NOTE — Assessment & Plan Note (Signed)
esomeprazole (NEXIUM) 40 MG capsule; Take 1 capsule (40 mg total) by mouth daily.  Dispense: 90 capsule; Refill: 1

## 2021-08-25 NOTE — Assessment & Plan Note (Signed)
-   metFORMIN (GLUCOPHAGE-XR) 500 MG 24 hr tablet; Take 2 tablets (1,000 mg total) by mouth 2 (two) times daily with a meal.  Dispense: 360 tablet; Refill: 3 - dapagliflozin propanediol (FARXIGA) 10 MG TABS tablet; Take 1 tablet (10 mg total) by mouth daily.  Dispense: 90 tablet; Refill: 1  Diabetic Diet  Stay active

## 2021-08-26 LAB — CBC
Hematocrit: 50.3 % (ref 37.5–51.0)
Hemoglobin: 16.9 g/dL (ref 13.0–17.7)
MCH: 30.1 pg (ref 26.6–33.0)
MCHC: 33.6 g/dL (ref 31.5–35.7)
MCV: 90 fL (ref 79–97)
Platelets: 275 10*3/uL (ref 150–450)
RBC: 5.62 x10E6/uL (ref 4.14–5.80)
RDW: 13.8 % (ref 11.6–15.4)
WBC: 7.9 10*3/uL (ref 3.4–10.8)

## 2021-08-26 LAB — COMPREHENSIVE METABOLIC PANEL
ALT: 36 IU/L (ref 0–44)
AST: 23 IU/L (ref 0–40)
Albumin/Globulin Ratio: 1.9 (ref 1.2–2.2)
Albumin: 4.5 g/dL (ref 3.8–4.9)
Alkaline Phosphatase: 78 IU/L (ref 44–121)
BUN/Creatinine Ratio: 13 (ref 9–20)
BUN: 10 mg/dL (ref 6–24)
Bilirubin Total: 0.4 mg/dL (ref 0.0–1.2)
CO2: 28 mmol/L (ref 20–29)
Calcium: 9.9 mg/dL (ref 8.7–10.2)
Chloride: 96 mmol/L (ref 96–106)
Creatinine, Ser: 0.75 mg/dL — ABNORMAL LOW (ref 0.76–1.27)
Globulin, Total: 2.4 g/dL (ref 1.5–4.5)
Glucose: 314 mg/dL — ABNORMAL HIGH (ref 70–99)
Potassium: 3.7 mmol/L (ref 3.5–5.2)
Sodium: 139 mmol/L (ref 134–144)
Total Protein: 6.9 g/dL (ref 6.0–8.5)
eGFR: 105 mL/min/{1.73_m2} (ref >59)

## 2021-08-26 LAB — HEMOGLOBIN A1C
Est. average glucose Bld gHb Est-mCnc: 217 mg/dL
Hgb A1c MFr Bld: 9.2 % — ABNORMAL HIGH (ref 4.8–5.6)

## 2021-09-16 ENCOUNTER — Encounter: Payer: Self-pay | Admitting: Family Medicine

## 2021-09-17 ENCOUNTER — Encounter: Payer: Self-pay | Admitting: Internal Medicine

## 2022-10-12 ENCOUNTER — Encounter: Payer: Self-pay | Admitting: Internal Medicine

## 2024-01-24 ENCOUNTER — Emergency Department (HOSPITAL_COMMUNITY): Payer: PRIVATE HEALTH INSURANCE

## 2024-01-24 ENCOUNTER — Inpatient Hospital Stay (HOSPITAL_COMMUNITY)
Admission: EM | Admit: 2024-01-24 | Discharge: 2024-02-12 | DRG: 216 | Disposition: A | Discharge status: Home / Self Care | Payer: PRIVATE HEALTH INSURANCE | Attending: Thoracic Surgery (Cardiothoracic Vascular Surgery) | Admitting: Thoracic Surgery (Cardiothoracic Vascular Surgery)

## 2024-01-24 ENCOUNTER — Other Ambulatory Visit: Payer: Self-pay

## 2024-01-24 DIAGNOSIS — F419 Anxiety disorder, unspecified: Secondary | ICD-10-CM | POA: Diagnosis present

## 2024-01-24 DIAGNOSIS — G8929 Other chronic pain: Secondary | ICD-10-CM | POA: Diagnosis present

## 2024-01-24 DIAGNOSIS — E1142 Type 2 diabetes mellitus with diabetic polyneuropathy: Secondary | ICD-10-CM | POA: Diagnosis present

## 2024-01-24 DIAGNOSIS — I7121 Aneurysm of the ascending aorta, without rupture: Secondary | ICD-10-CM

## 2024-01-24 DIAGNOSIS — Z8672 Personal history of thrombophlebitis: Secondary | ICD-10-CM

## 2024-01-24 DIAGNOSIS — Z7984 Long term (current) use of oral hypoglycemic drugs: Secondary | ICD-10-CM

## 2024-01-24 DIAGNOSIS — K449 Diaphragmatic hernia without obstruction or gangrene: Secondary | ICD-10-CM | POA: Diagnosis present

## 2024-01-24 DIAGNOSIS — E861 Hypovolemia: Secondary | ICD-10-CM | POA: Diagnosis present

## 2024-01-24 DIAGNOSIS — Z716 Tobacco abuse counseling: Secondary | ICD-10-CM

## 2024-01-24 DIAGNOSIS — Z951 Presence of aortocoronary bypass graft: Secondary | ICD-10-CM

## 2024-01-24 DIAGNOSIS — Z79890 Hormone replacement therapy (postmenopausal): Secondary | ICD-10-CM

## 2024-01-24 DIAGNOSIS — D62 Acute posthemorrhagic anemia: Secondary | ICD-10-CM | POA: Diagnosis not present

## 2024-01-24 DIAGNOSIS — I10 Essential (primary) hypertension: Secondary | ICD-10-CM

## 2024-01-24 DIAGNOSIS — Z888 Allergy status to other drugs, medicaments and biological substances status: Secondary | ICD-10-CM

## 2024-01-24 DIAGNOSIS — E119 Type 2 diabetes mellitus without complications: Secondary | ICD-10-CM | POA: Diagnosis not present

## 2024-01-24 DIAGNOSIS — I2510 Atherosclerotic heart disease of native coronary artery without angina pectoris: Secondary | ICD-10-CM | POA: Diagnosis present

## 2024-01-24 DIAGNOSIS — I110 Hypertensive heart disease with heart failure: Secondary | ICD-10-CM | POA: Diagnosis present

## 2024-01-24 DIAGNOSIS — E785 Hyperlipidemia, unspecified: Secondary | ICD-10-CM | POA: Diagnosis present

## 2024-01-24 DIAGNOSIS — E876 Hypokalemia: Secondary | ICD-10-CM

## 2024-01-24 DIAGNOSIS — Z6833 Body mass index (BMI) 33.0-33.9, adult: Secondary | ICD-10-CM

## 2024-01-24 DIAGNOSIS — I25119 Atherosclerotic heart disease of native coronary artery with unspecified angina pectoris: Secondary | ICD-10-CM

## 2024-01-24 DIAGNOSIS — N179 Acute kidney failure, unspecified: Secondary | ICD-10-CM | POA: Diagnosis present

## 2024-01-24 DIAGNOSIS — I712 Thoracic aortic aneurysm, without rupture, unspecified: Secondary | ICD-10-CM | POA: Diagnosis present

## 2024-01-24 DIAGNOSIS — D72829 Elevated white blood cell count, unspecified: Secondary | ICD-10-CM | POA: Diagnosis present

## 2024-01-24 DIAGNOSIS — E039 Hypothyroidism, unspecified: Secondary | ICD-10-CM | POA: Diagnosis present

## 2024-01-24 DIAGNOSIS — G4733 Obstructive sleep apnea (adult) (pediatric): Secondary | ICD-10-CM | POA: Diagnosis present

## 2024-01-24 DIAGNOSIS — I25110 Atherosclerotic heart disease of native coronary artery with unstable angina pectoris: Secondary | ICD-10-CM | POA: Diagnosis not present

## 2024-01-24 DIAGNOSIS — R0789 Other chest pain: Secondary | ICD-10-CM | POA: Diagnosis not present

## 2024-01-24 DIAGNOSIS — R079 Chest pain, unspecified: Principal | ICD-10-CM

## 2024-01-24 DIAGNOSIS — Q250 Patent ductus arteriosus: Secondary | ICD-10-CM

## 2024-01-24 DIAGNOSIS — I714 Abdominal aortic aneurysm, without rupture, unspecified: Secondary | ICD-10-CM | POA: Diagnosis present

## 2024-01-24 DIAGNOSIS — F17210 Nicotine dependence, cigarettes, uncomplicated: Secondary | ICD-10-CM | POA: Diagnosis present

## 2024-01-24 DIAGNOSIS — Z794 Long term (current) use of insulin: Secondary | ICD-10-CM

## 2024-01-24 DIAGNOSIS — Z79891 Long term (current) use of opiate analgesic: Secondary | ICD-10-CM

## 2024-01-24 DIAGNOSIS — J9811 Atelectasis: Secondary | ICD-10-CM | POA: Diagnosis not present

## 2024-01-24 DIAGNOSIS — M47816 Spondylosis without myelopathy or radiculopathy, lumbar region: Secondary | ICD-10-CM | POA: Diagnosis present

## 2024-01-24 DIAGNOSIS — K029 Dental caries, unspecified: Secondary | ICD-10-CM | POA: Diagnosis present

## 2024-01-24 DIAGNOSIS — M1612 Unilateral primary osteoarthritis, left hip: Secondary | ICD-10-CM | POA: Diagnosis present

## 2024-01-24 DIAGNOSIS — E1165 Type 2 diabetes mellitus with hyperglycemia: Secondary | ICD-10-CM | POA: Diagnosis present

## 2024-01-24 DIAGNOSIS — Z860101 Personal history of adenomatous and serrated colon polyps: Secondary | ICD-10-CM

## 2024-01-24 DIAGNOSIS — I5033 Acute on chronic diastolic (congestive) heart failure: Secondary | ICD-10-CM | POA: Diagnosis not present

## 2024-01-24 DIAGNOSIS — I5031 Acute diastolic (congestive) heart failure: Secondary | ICD-10-CM

## 2024-01-24 DIAGNOSIS — M5430 Sciatica, unspecified side: Secondary | ICD-10-CM | POA: Diagnosis present

## 2024-01-24 DIAGNOSIS — Z96643 Presence of artificial hip joint, bilateral: Secondary | ICD-10-CM | POA: Diagnosis present

## 2024-01-24 DIAGNOSIS — I959 Hypotension, unspecified: Secondary | ICD-10-CM

## 2024-01-24 DIAGNOSIS — Z79899 Other long term (current) drug therapy: Secondary | ICD-10-CM

## 2024-01-24 DIAGNOSIS — Z8249 Family history of ischemic heart disease and other diseases of the circulatory system: Secondary | ICD-10-CM

## 2024-01-24 DIAGNOSIS — Q2381 Bicuspid aortic valve: Secondary | ICD-10-CM

## 2024-01-24 DIAGNOSIS — K589 Irritable bowel syndrome without diarrhea: Secondary | ICD-10-CM | POA: Diagnosis present

## 2024-01-24 DIAGNOSIS — I350 Nonrheumatic aortic (valve) stenosis: Secondary | ICD-10-CM | POA: Diagnosis present

## 2024-01-24 DIAGNOSIS — Q2543 Congenital aneurysm of aorta: Secondary | ICD-10-CM

## 2024-01-24 DIAGNOSIS — M51369 Other intervertebral disc degeneration, lumbar region without mention of lumbar back pain or lower extremity pain: Secondary | ICD-10-CM | POA: Diagnosis present

## 2024-01-24 DIAGNOSIS — I442 Atrioventricular block, complete: Secondary | ICD-10-CM | POA: Diagnosis present

## 2024-01-24 DIAGNOSIS — Z7982 Long term (current) use of aspirin: Secondary | ICD-10-CM

## 2024-01-24 DIAGNOSIS — K219 Gastro-esophageal reflux disease without esophagitis: Secondary | ICD-10-CM | POA: Diagnosis not present

## 2024-01-24 DIAGNOSIS — E782 Mixed hyperlipidemia: Secondary | ICD-10-CM

## 2024-01-24 DIAGNOSIS — H10029 Other mucopurulent conjunctivitis, unspecified eye: Secondary | ICD-10-CM | POA: Diagnosis present

## 2024-01-24 DIAGNOSIS — Z833 Family history of diabetes mellitus: Secondary | ICD-10-CM

## 2024-01-24 DIAGNOSIS — I200 Unstable angina: Secondary | ICD-10-CM

## 2024-01-24 LAB — CBC
HCT: 46.3 % (ref 39.0–52.0)
Hemoglobin: 15 g/dL (ref 13.0–17.0)
MCH: 29.2 pg (ref 26.0–34.0)
MCHC: 32.4 g/dL (ref 30.0–36.0)
MCV: 90.1 fL (ref 80.0–100.0)
Platelets: 300 K/uL (ref 150–400)
RBC: 5.14 MIL/uL (ref 4.22–5.81)
RDW: 15.7 % — ABNORMAL HIGH (ref 11.5–15.5)
WBC: 10.1 K/uL (ref 4.0–10.5)
nRBC: 0 % (ref 0.0–0.2)

## 2024-01-24 LAB — URINALYSIS, ROUTINE W REFLEX MICROSCOPIC
Bilirubin Urine: NEGATIVE
Glucose, UA: >=500 mg/dL — AB
Hgb urine dipstick: NEGATIVE
Ketones, ur: NEGATIVE mg/dL
Leukocytes,Ua: NEGATIVE
Nitrite: NEGATIVE
Protein, ur: NEGATIVE mg/dL
Specific Gravity, Urine: 1.022 (ref 1.005–1.030)
pH: 5 (ref 5.0–8.0)

## 2024-01-24 LAB — BASIC METABOLIC PANEL WITH GFR
Anion gap: 20 — ABNORMAL HIGH (ref 5–15)
BUN: 19 mg/dL (ref 6–20)
CO2: 19 mmol/L — ABNORMAL LOW (ref 22–32)
Calcium: 8.7 mg/dL — ABNORMAL LOW (ref 8.9–10.3)
Chloride: 97 mmol/L — ABNORMAL LOW (ref 98–111)
Creatinine, Ser: 1.32 mg/dL — ABNORMAL HIGH (ref 0.61–1.24)
GFR, Estimated: >60 mL/min (ref >60)
Glucose, Bld: 202 mg/dL — ABNORMAL HIGH (ref 70–99)
Potassium: 2.5 mmol/L — CL (ref 3.5–5.1)
Sodium: 136 mmol/L (ref 135–145)

## 2024-01-24 LAB — TROPONIN I (HIGH SENSITIVITY)
Troponin I (High Sensitivity): 13 ng/L (ref <18)
Troponin I (High Sensitivity): 11 ng/L (ref <18)

## 2024-01-24 LAB — POTASSIUM: Potassium: 3 mmol/L — ABNORMAL LOW (ref 3.5–5.1)

## 2024-01-24 LAB — MAGNESIUM: Magnesium: 2.1 mg/dL (ref 1.7–2.4)

## 2024-01-24 MED ORDER — ONDANSETRON HCL 4 MG/2ML IJ SOLN: 4.0000 mg | Freq: Four times a day (QID) | INTRAMUSCULAR | Status: DC | PRN

## 2024-01-24 MED ORDER — MELATONIN 3 MG PO TABS
3.0000 mg | ORAL_TABLET | Freq: Every evening | ORAL | Status: DC | PRN
  Administered 2024-01-29 – 2024-02-04 (×5): 3 mg via ORAL
  Filled 2024-01-24 (×5): qty 1

## 2024-01-24 MED ORDER — LACTATED RINGERS IV BOLUS
500.0000 mL | Freq: Once | INTRAVENOUS | Status: AC
  Administered 2024-01-24: 500 mL via INTRAVENOUS

## 2024-01-24 MED ORDER — ACETAMINOPHEN 650 MG RE SUPP: 650.0000 mg | Freq: Four times a day (QID) | RECTAL | Status: DC | PRN

## 2024-01-24 MED ORDER — LORAZEPAM 2 MG/ML IJ SOLN: 0.5000 mg | Freq: Four times a day (QID) | INTRAMUSCULAR | Status: DC | PRN

## 2024-01-24 MED ORDER — MAGNESIUM OXIDE -MG SUPPLEMENT 400 (240 MG) MG PO TABS
800.0000 mg | ORAL_TABLET | Freq: Once | ORAL | Status: AC
  Administered 2024-01-24: 800 mg via ORAL
  Filled 2024-01-24: qty 2

## 2024-01-24 MED ORDER — LACTATED RINGERS IV BOLUS
1000.0000 mL | Freq: Once | INTRAVENOUS | Status: AC
  Administered 2024-01-24: 1000 mL via INTRAVENOUS

## 2024-01-24 MED ORDER — NITROGLYCERIN 0.4 MG SL SUBL: 0.4000 mg | SUBLINGUAL_TABLET | Freq: Once | SUBLINGUAL | Status: DC

## 2024-01-24 MED ORDER — ACETAMINOPHEN 325 MG PO TABS
650.0000 mg | ORAL_TABLET | Freq: Four times a day (QID) | ORAL | Status: DC | PRN
  Administered 2024-01-26 – 2024-01-29 (×6): 650 mg via ORAL
  Filled 2024-01-24 (×5): qty 2

## 2024-01-24 MED ORDER — POTASSIUM CHLORIDE CRYS ER 20 MEQ PO TBCR
40.0000 meq | EXTENDED_RELEASE_TABLET | Freq: Once | ORAL | Status: AC
  Administered 2024-01-24: 40 meq via ORAL
  Filled 2024-01-24: qty 2

## 2024-01-24 MED ORDER — POTASSIUM CHLORIDE 20 MEQ PO PACK
40.0000 meq | PACK | Freq: Two times a day (BID) | ORAL | Status: DC
  Administered 2024-01-24: 40 meq via ORAL
  Filled 2024-01-24: qty 2

## 2024-01-24 MED ORDER — FENTANYL CITRATE PF 50 MCG/ML IJ SOSY: 25.0000 ug | PREFILLED_SYRINGE | INTRAMUSCULAR | Status: DC | PRN

## 2024-01-24 MED ORDER — NALOXONE HCL 0.4 MG/ML IJ SOLN: 0.4000 mg | INTRAMUSCULAR | Status: DC | PRN

## 2024-01-24 MED ORDER — NITROGLYCERIN 0.4 MG SL SUBL
0.4000 mg | SUBLINGUAL_TABLET | SUBLINGUAL | Status: DC | PRN
  Administered 2024-01-30 (×2): 0.4 mg via SUBLINGUAL
  Filled 2024-01-24: qty 1

## 2024-01-24 MED ORDER — FENTANYL CITRATE PF 50 MCG/ML IJ SOSY
50.0000 ug | PREFILLED_SYRINGE | INTRAMUSCULAR | Status: DC | PRN
  Administered 2024-01-31 – 2024-02-01 (×4): 50 ug via INTRAVENOUS
  Filled 2024-01-24 (×4): qty 1

## 2024-01-24 NOTE — ED Triage Notes (Signed)
 Centralized chest pressure 8/10 initially but has gotten better. 4/10 presently. Hx of afib - rate controlled.  324 ASA BP 100/60 18 LAC
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
## 2024-01-24 NOTE — ED Notes (Signed)
 Critical low potassium of 2.5. MD made aware

## 2024-01-25 ENCOUNTER — Observation Stay (HOSPITAL_COMMUNITY): Payer: PRIVATE HEALTH INSURANCE

## 2024-01-25 ENCOUNTER — Encounter (HOSPITAL_COMMUNITY): Payer: Self-pay | Admitting: Internal Medicine

## 2024-01-25 ENCOUNTER — Observation Stay (HOSPITAL_BASED_OUTPATIENT_CLINIC_OR_DEPARTMENT_OTHER): Payer: PRIVATE HEALTH INSURANCE

## 2024-01-25 DIAGNOSIS — R079 Chest pain, unspecified: Secondary | ICD-10-CM

## 2024-01-25 DIAGNOSIS — I10 Essential (primary) hypertension: Secondary | ICD-10-CM | POA: Diagnosis not present

## 2024-01-25 DIAGNOSIS — I200 Unstable angina: Secondary | ICD-10-CM

## 2024-01-25 DIAGNOSIS — Z720 Tobacco use: Secondary | ICD-10-CM

## 2024-01-25 DIAGNOSIS — E876 Hypokalemia: Secondary | ICD-10-CM | POA: Diagnosis present

## 2024-01-25 DIAGNOSIS — R0789 Other chest pain: Secondary | ICD-10-CM | POA: Diagnosis not present

## 2024-01-25 DIAGNOSIS — I7121 Aneurysm of the ascending aorta, without rupture: Secondary | ICD-10-CM | POA: Diagnosis not present

## 2024-01-25 LAB — COMPREHENSIVE METABOLIC PANEL WITH GFR
ALT: 20 U/L (ref 0–44)
AST: 19 U/L (ref 15–41)
Albumin: 3.3 g/dL — ABNORMAL LOW (ref 3.5–5.0)
Alkaline Phosphatase: 44 U/L (ref 38–126)
Anion gap: 12 (ref 5–15)
BUN: 16 mg/dL (ref 6–20)
CO2: 28 mmol/L (ref 22–32)
Calcium: 9 mg/dL (ref 8.9–10.3)
Chloride: 98 mmol/L (ref 98–111)
Creatinine, Ser: 0.93 mg/dL (ref 0.61–1.24)
GFR, Estimated: >60 mL/min (ref >60)
Glucose, Bld: 147 mg/dL — ABNORMAL HIGH (ref 70–99)
Potassium: 3.5 mmol/L (ref 3.5–5.1)
Sodium: 138 mmol/L (ref 135–145)
Total Bilirubin: 0.7 mg/dL (ref 0.0–1.2)
Total Protein: 5.9 g/dL — ABNORMAL LOW (ref 6.5–8.1)

## 2024-01-25 LAB — GLUCOSE, CAPILLARY
Glucose-Capillary: 150 mg/dL — ABNORMAL HIGH (ref 70–99)
Glucose-Capillary: 157 mg/dL — ABNORMAL HIGH (ref 70–99)
Glucose-Capillary: 185 mg/dL — ABNORMAL HIGH (ref 70–99)
Glucose-Capillary: 194 mg/dL — ABNORMAL HIGH (ref 70–99)
Glucose-Capillary: 208 mg/dL — ABNORMAL HIGH (ref 70–99)
Glucose-Capillary: 258 mg/dL — ABNORMAL HIGH (ref 70–99)

## 2024-01-25 LAB — CBC WITH DIFFERENTIAL/PLATELET
Abs Immature Granulocytes: 0.04 K/uL (ref 0.00–0.07)
Basophils Absolute: 0.1 K/uL (ref 0.0–0.1)
Basophils Relative: 2 %
Eosinophils Absolute: 0.2 K/uL (ref 0.0–0.5)
Eosinophils Relative: 4 %
HCT: 42.8 % (ref 39.0–52.0)
Hemoglobin: 14.3 g/dL (ref 13.0–17.0)
Immature Granulocytes: 1 %
Lymphocytes Relative: 26 %
Lymphs Abs: 1.6 K/uL (ref 0.7–4.0)
MCH: 29.9 pg (ref 26.0–34.0)
MCHC: 33.4 g/dL (ref 30.0–36.0)
MCV: 89.4 fL (ref 80.0–100.0)
Monocytes Absolute: 0.6 K/uL (ref 0.1–1.0)
Monocytes Relative: 9 %
Neutro Abs: 3.6 K/uL (ref 1.7–7.7)
Neutrophils Relative %: 58 %
Platelets: 225 K/uL (ref 150–400)
RBC: 4.79 MIL/uL (ref 4.22–5.81)
RDW: 15.7 % — ABNORMAL HIGH (ref 11.5–15.5)
WBC: 6.1 K/uL (ref 4.0–10.5)
nRBC: 0 % (ref 0.0–0.2)

## 2024-01-25 LAB — ECHOCARDIOGRAM COMPLETE
AR max vel: 1.59 cm2
AV Area VTI: 1.62 cm2
AV Area mean vel: 1.53 cm2
AV Mean grad: 14 mmHg
AV Peak grad: 24.6 mmHg
Ao pk vel: 2.48 m/s
Area-P 1/2: 3.61 cm2
Height: 73 in
S' Lateral: 3.9 cm
Weight: 4056.46 [oz_av]

## 2024-01-25 LAB — HEMOGLOBIN A1C
Hgb A1c MFr Bld: 7.4 % — ABNORMAL HIGH (ref 4.8–5.6)
Mean Plasma Glucose: 165.68 mg/dL

## 2024-01-25 LAB — MAGNESIUM: Magnesium: 2 mg/dL (ref 1.7–2.4)

## 2024-01-25 LAB — LIPASE, BLOOD: Lipase: 37 U/L (ref 11–51)

## 2024-01-25 LAB — TROPONIN I (HIGH SENSITIVITY): Troponin I (High Sensitivity): 10 ng/L (ref <18)

## 2024-01-25 MED ORDER — INSULIN ASPART 100 UNIT/ML IJ SOLN
0.0000 [IU] | Freq: Every day | INTRAMUSCULAR | Status: DC
  Administered 2024-01-26 – 2024-02-04 (×6): 2 [IU] via SUBCUTANEOUS
  Administered 2024-02-05: 3 [IU] via SUBCUTANEOUS

## 2024-01-25 MED ORDER — INSULIN ASPART 100 UNIT/ML IJ SOLN
0.0000 [IU] | Freq: Three times a day (TID) | INTRAMUSCULAR | Status: DC
  Administered 2024-01-25: 3 [IU] via SUBCUTANEOUS
  Administered 2024-01-25 (×2): 2 [IU] via SUBCUTANEOUS
  Administered 2024-01-26: 1 [IU] via SUBCUTANEOUS
  Administered 2024-01-26: 2 [IU] via SUBCUTANEOUS
  Administered 2024-01-27 (×3): 3 [IU] via SUBCUTANEOUS
  Administered 2024-01-28 – 2024-01-29 (×4): 2 [IU] via SUBCUTANEOUS
  Administered 2024-01-29: 3 [IU] via SUBCUTANEOUS
  Administered 2024-01-29: 2 [IU] via SUBCUTANEOUS
  Administered 2024-01-30: 1 [IU] via SUBCUTANEOUS
  Administered 2024-01-30: 2 [IU] via SUBCUTANEOUS
  Administered 2024-01-30 – 2024-02-01 (×4): 1 [IU] via SUBCUTANEOUS
  Administered 2024-02-01 – 2024-02-02 (×3): 2 [IU] via SUBCUTANEOUS
  Administered 2024-02-02: 3 [IU] via SUBCUTANEOUS
  Administered 2024-02-02: 2 [IU] via SUBCUTANEOUS

## 2024-01-25 MED ORDER — ASPIRIN 81 MG PO TBEC
81.0000 mg | DELAYED_RELEASE_TABLET | Freq: Every day | ORAL | Status: DC
Start: 2024-01-27 — End: 2024-02-06
  Administered 2024-01-27 – 2024-02-05 (×10): 81 mg via ORAL
  Filled 2024-01-25 (×10): qty 1

## 2024-01-25 MED ORDER — RAMIPRIL 5 MG PO CAPS
10.0000 mg | ORAL_CAPSULE | Freq: Every day | ORAL | Status: DC
  Administered 2024-01-25 – 2024-02-04 (×11): 10 mg via ORAL
  Filled 2024-01-25 (×11): qty 2

## 2024-01-25 MED ORDER — ASPIRIN 81 MG PO TBEC
81.0000 mg | DELAYED_RELEASE_TABLET | Freq: Every day | ORAL | Status: DC
  Administered 2024-01-25: 81 mg via ORAL
  Filled 2024-01-25: qty 1

## 2024-01-25 MED ORDER — PANTOPRAZOLE SODIUM 40 MG PO TBEC
40.0000 mg | DELAYED_RELEASE_TABLET | Freq: Every day | ORAL | Status: DC
  Administered 2024-01-25 – 2024-02-05 (×12): 40 mg via ORAL
  Filled 2024-01-25 (×12): qty 1

## 2024-01-25 MED ORDER — PERFLUTREN LIPID MICROSPHERE
1.0000 mL | INTRAVENOUS | Status: AC | PRN
  Administered 2024-01-25: 5 mL via INTRAVENOUS

## 2024-01-25 MED ORDER — LEVOTHYROXINE SODIUM 175 MCG PO TABS
175.0000 ug | ORAL_TABLET | Freq: Every day | ORAL | Status: DC
  Filled 2024-01-25: qty 1

## 2024-01-25 MED ORDER — POTASSIUM CHLORIDE CRYS ER 20 MEQ PO TBCR
60.0000 meq | EXTENDED_RELEASE_TABLET | Freq: Once | ORAL | Status: AC
  Administered 2024-01-25: 60 meq via ORAL
  Filled 2024-01-25: qty 3

## 2024-01-25 MED ORDER — ATORVASTATIN CALCIUM 40 MG PO TABS
40.0000 mg | ORAL_TABLET | Freq: Every day | ORAL | Status: DC
  Administered 2024-01-25 – 2024-02-12 (×19): 40 mg via ORAL
  Filled 2024-01-25 (×17): qty 1

## 2024-01-25 MED ORDER — CARVEDILOL 6.25 MG PO TABS
6.2500 mg | ORAL_TABLET | Freq: Two times a day (BID) | ORAL | Status: DC
  Administered 2024-01-25 (×2): 6.25 mg via ORAL
  Filled 2024-01-25 (×2): qty 1

## 2024-01-25 MED ORDER — IOHEXOL 350 MG/ML SOLN
75.0000 mL | Freq: Once | INTRAVENOUS | Status: AC | PRN
  Administered 2024-01-25: 75 mL via INTRAVENOUS

## 2024-01-25 MED ORDER — LEVOTHYROXINE SODIUM 75 MCG PO TABS
175.0000 ug | ORAL_TABLET | Freq: Every day | ORAL | Status: DC
  Administered 2024-01-25 – 2024-02-12 (×20): 175 ug via ORAL
  Filled 2024-01-25 (×17): qty 1

## 2024-01-25 MED ORDER — ASPIRIN 81 MG PO CHEW
81.0000 mg | CHEWABLE_TABLET | ORAL | Status: AC
  Administered 2024-01-26: 81 mg via ORAL
  Filled 2024-01-25: qty 1

## 2024-01-25 MED ORDER — FREE WATER
500.0000 mL | Freq: Once | Status: AC
  Administered 2024-01-26: 500 mL via ORAL

## 2024-01-25 NOTE — ED Notes (Signed)
 PIV hard to flush and no blood return noted.
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
## 2024-01-25 NOTE — Progress Notes (Signed)
 [facility] PROGRESS NOTE    Progress Note  Colin Ramirez  FMW:[id number] DOB: 1966-02-01 DOA: 01/24/2024 PCP: [name], MD (Inactive)     Brief Narrative:   Colin Ramirez is an 58 y.o. male past medical history significant for coronary artery disease, with a left heart cath on 10/28/2017 that showed two-vessel disease with moderate stenosis, diabetes mellitus type 2, essential hypertension admitted to [facility] when he started having chest pain intermittent nonradiating that started about 6 days prior to admission not associated with shortness of breath or palpitation he does relate recent nausea and vomiting.  Cardiac biomarkers are basically remained negative, twelve-lead EKG showed normal sinus rhythm normal axis no T wave abnormalities.   Assessment/Plan:   Atypical chest pain Cardiac biomarkers negative twelve-lead EKG showed normal sinus rhythm no evidence of ischemia. Left heart cath on May 2019 that showed moderate two-vessel disease. 2D echo is pending. Pain worst with exertion he did not have his nitroglycerin . He has multiple risk factors age known coronary artery disease uncontrolled diabetes and an ongoing tobacco abuse. Will consult cardiology. Continue aspirin , beta-blockers and statins    Hypokalemia: Repleted now 3.5 will give an additional dose this morning. Mag is 2.0.  Diabetes mellitus type 2: With a last A1c of 9.2 in February 2023. Started on sliding scale insulin  with CBGs AC and at bedtime. Holding oral hypoglycemic agents including metformin   Essential hypertension: HCTZ was held on admission. Resume Coreg .  Hyperlipidemia: Continue statins.  GERD: Continue PPI    DVT prophylaxis: lovenox  Family Communication:noen Status is: Observation The patient remains OBS appropriate and will d/c before 2 midnights.    Code Status:     Code Status Orders  (From admission, onward)           Start      Ordered   01/24/24 2017  Full code  Continuous       Question:  By:  Answer:  Consent: discussion documented in EHR   01/24/24 2017           Code Status History     Date Active Date Inactive Code Status Order ID Comments User Context   06/07/2018 2112 06/08/2018 2158 Full Code [id number]  Lonzell Emeline HERO, DO ED   10/28/2017 1648 10/29/2017 1450 Full Code [id number]  Maxie Herlene POUR, PA-C ED   08/20/2016 1306 08/21/2016 1715 Full Code [id number]  Vernetta Lonni GRADE, MD Inpatient         IV Access:   Peripheral IV   Procedures and diagnostic studies:   DG Chest 2 View Result Date: 01/24/2024 CLINICAL DATA:  chest pain. EXAM: CHEST - 2 VIEW COMPARISON:  02/03/2022. FINDINGS: Low lung volume. Bilateral lung fields are clear. Bilateral costophrenic angles are clear. Stable cardio-mediastinal silhouette. No acute osseous abnormalities. The soft tissues are within normal limits. IMPRESSION: No active cardiopulmonary disease. Electronically Signed   By: Ree Molt M.D.   On: 01/24/2024 16:23     Medical Consultants:   None.   Subjective:    Colin Ramirez denies any chest pain.  Objective:    Vitals:   01/24/24 2322 01/25/24 0421 01/25/24 0422 01/25/24 0429  BP:  (!) 144/90    Pulse:  73    Resp:  19    Temp: 98.2 F (36.8 C)  97.6 F (36.4 C)   TempSrc: Oral  Oral   SpO2:  99%    Weight:    115 kg  Height:  SpO2: 99 %   Intake/Output Summary (Last 24 hours) at 01/25/2024 0625 Last data filed at 01/25/2024 0431 Gross per 24 hour  Intake 1370 ml  Output 2000 ml  Net -630 ml   Filed Weights   01/24/24 1526 01/25/24 0429  Weight: 115 kg 115 kg    Exam: General exam: In no acute distress. Respiratory system: Good air movement and clear to auscultation. Cardiovascular system: S1 & S2 heard, RRR. No JVD. Gastrointestinal system: Abdomen is nondistended, soft and nontender.  Extremities: No pedal edema. Skin: No rashes, lesions or  ulcers Psychiatry: Judgement and insight appear normal. Mood & affect appropriate.    Data Reviewed:    Labs: Basic Metabolic Panel: Recent Labs  Lab 01/24/24 1557 01/24/24 1803 01/24/24 1956 01/25/24 0511  NA 136  --   --  138  K 2.5*  --  3.0* 3.5  CL 97*  --   --  98  CO2 19*  --   --  28  GLUCOSE 202*  --   --  147*  BUN 19  --   --  16  CREATININE 1.32*  --   --  0.93  CALCIUM  8.7*  --   --  9.0  MG  --  2.1  --  2.0   GFR Estimated Creatinine Clearance: 112.2 mL/min (by C-G formula based on SCr of 0.93 mg/dL). Liver Function Tests: Recent Labs  Lab 01/25/24 0511  AST 19  ALT 20  ALKPHOS 44  BILITOT 0.7  PROT 5.9*  ALBUMIN 3.3*   No results for input(s): LIPASE, AMYLASE in the last 168 hours. No results for input(s): AMMONIA in the last 168 hours. Coagulation profile No results for input(s): INR, PROTIME in the last 168 hours. COVID-19 Labs  No results for input(s): DDIMER, FERRITIN, LDH, CRP in the last 72 hours.  Lab Results  Component Value Date   SARSCOV2NAA RESULT: NEGATIVE 08/24/2019   SARSCOV2NAA Not Detected 05/11/2019   SARSCOV2NAA Not Detected 01/17/2019    CBC: Recent Labs  Lab 01/24/24 1557 01/25/24 0511  WBC 10.1 6.1  NEUTROABS  --  3.6  HGB 15.0 14.3  HCT 46.3 42.8  MCV 90.1 89.4  PLT 300 225   Cardiac Enzymes: No results for input(s): CKTOTAL, CKMB, CKMBINDEX, TROPONINI in the last 168 hours. BNP (last 3 results) No results for input(s): PROBNP in the last 8760 hours. CBG: No results for input(s): GLUCAP in the last 168 hours. D-Dimer: No results for input(s): DDIMER in the last 72 hours. Hgb A1c: No results for input(s): HGBA1C in the last 72 hours. Lipid Profile: No results for input(s): CHOL, HDL, LDLCALC, TRIG, CHOLHDL, LDLDIRECT in the last 72 hours. Thyroid  function studies: No results for input(s): TSH, T4TOTAL, T3FREE, THYROIDAB in the last 72  hours.  Invalid input(s): FREET3 Anemia work up: No results for input(s): VITAMINB12, FOLATE, FERRITIN, TIBC, IRON, RETICCTPCT in the last 72 hours. Sepsis Labs: Recent Labs  Lab 01/24/24 1557 01/25/24 0511  WBC 10.1 6.1   Microbiology No results found for this or any previous visit (from the past 240 hours).   Medications:    aspirin  EC  81 mg Oral Daily   atorvastatin   80 mg Oral Daily   insulin  aspart  0-5 Units Subcutaneous QHS   insulin  aspart  0-9 Units Subcutaneous TID WC   levothyroxine   150 mcg Oral Daily   nitroGLYCERIN   0.4 mg Sublingual Once   pantoprazole   40 mg Oral Daily   potassium chloride   40 mEq Oral BID   ramipril   10 mg Oral Daily   Continuous Infusions:    LOS: 0 days   Erle Odell Castor  [facility]  01/25/2024, 6:25 AM

## 2024-01-25 NOTE — ED Notes (Signed)
 Handoff given to [facility] nurse.

## 2024-01-25 NOTE — Progress Notes (Signed)
 Mobility Specialist Progress Note:    01/25/24 1522  Mobility  Activity Pivoted/transferred from chair to bed;Dangled on edge of bed  Level of Assistance Standby assist, set-up cues, supervision of patient - no hands on  Assistive Device None  Distance Ambulated (ft) 5 ft  Activity Response Tolerated well  Mobility Referral Yes  Mobility visit 1 Mobility  Mobility Specialist Start Time (ACUTE ONLY) 1522  Mobility Specialist Stop Time (ACUTE ONLY) 1527  Mobility Specialist Time Calculation (min) (ACUTE ONLY) 5 min   Pt pleasant and agreeable to session. Received pt about to eat lunch but willing to do a small amount very quickly. Pt w/o any assistance needs. No c/o any symptoms.   Venetia Keel Mobility Specialist Please Neurosurgeon or Rehab Office at [phone]

## 2024-01-26 ENCOUNTER — Encounter (HOSPITAL_COMMUNITY): Payer: Self-pay | Admitting: Cardiovascular Disease

## 2024-01-26 ENCOUNTER — Encounter (HOSPITAL_COMMUNITY)
Admission: EM | Disposition: A | Discharge status: Home / Self Care | Payer: Self-pay | Source: Home / Self Care | Attending: Internal Medicine

## 2024-01-26 DIAGNOSIS — I25110 Atherosclerotic heart disease of native coronary artery with unstable angina pectoris: Principal | ICD-10-CM

## 2024-01-26 DIAGNOSIS — R0789 Other chest pain: Secondary | ICD-10-CM | POA: Diagnosis not present

## 2024-01-26 HISTORY — PX: RIGHT/LEFT HEART CATH AND CORONARY ANGIOGRAPHY: CATH118266

## 2024-01-26 LAB — BASIC METABOLIC PANEL WITH GFR
Anion gap: 9 (ref 5–15)
BUN: 13 mg/dL (ref 6–20)
CO2: 25 mmol/L (ref 22–32)
Calcium: 8.8 mg/dL — ABNORMAL LOW (ref 8.9–10.3)
Chloride: 103 mmol/L (ref 98–111)
Creatinine, Ser: 0.66 mg/dL (ref 0.61–1.24)
GFR, Estimated: >60 mL/min (ref >60)
Glucose, Bld: 143 mg/dL — ABNORMAL HIGH (ref 70–99)
Potassium: 3.6 mmol/L (ref 3.5–5.1)
Sodium: 137 mmol/L (ref 135–145)

## 2024-01-26 LAB — POCT I-STAT 7, (LYTES, BLD GAS, ICA,H+H)
Acid-Base Excess: 3 mmol/L — ABNORMAL HIGH (ref 0.0–2.0)
Acid-base deficit: 6 mmol/L — ABNORMAL HIGH (ref 0.0–2.0)
Bicarbonate: 19.8 mmol/L — ABNORMAL LOW (ref 20.0–28.0)
Bicarbonate: 27.2 mmol/L (ref 20.0–28.0)
Calcium, Ion: 0.67 mmol/L — CL (ref 1.15–1.40)
Calcium, Ion: 1.14 mmol/L — ABNORMAL LOW (ref 1.15–1.40)
HCT: 32 % — ABNORMAL LOW (ref 39.0–52.0)
HCT: 39 % (ref 39.0–52.0)
Hemoglobin: 10.9 g/dL — ABNORMAL LOW (ref 13.0–17.0)
Hemoglobin: 13.3 g/dL (ref 13.0–17.0)
O2 Saturation: 56 %
O2 Saturation: 87 %
Potassium: 2.2 mmol/L — CL (ref 3.5–5.1)
Potassium: 3.4 mmol/L — ABNORMAL LOW (ref 3.5–5.1)
Sodium: 135 mmol/L (ref 135–145)
Sodium: 140 mmol/L (ref 135–145)
TCO2: 21 mmol/L — ABNORMAL LOW (ref 22–32)
TCO2: 28 mmol/L (ref 22–32)
pCO2 arterial: 39.3 mmHg (ref 32–48)
pCO2 arterial: 40.5 mmHg (ref 32–48)
pH, Arterial: 7.31 — ABNORMAL LOW (ref 7.35–7.45)
pH, Arterial: 7.436 (ref 7.35–7.45)
pO2, Arterial: 32 mmHg — CL (ref 83–108)
pO2, Arterial: 52 mmHg — ABNORMAL LOW (ref 83–108)

## 2024-01-26 LAB — LIPID PANEL
Cholesterol: 144 mg/dL (ref 0–200)
HDL: 39 mg/dL — ABNORMAL LOW (ref >40)
LDL Cholesterol: 46 mg/dL (ref 0–99)
Total CHOL/HDL Ratio: 3.7 ratio
Triglycerides: 296 mg/dL — ABNORMAL HIGH (ref <150)
VLDL: 59 mg/dL — ABNORMAL HIGH (ref 0–40)

## 2024-01-26 LAB — GLUCOSE, CAPILLARY
Glucose-Capillary: 167 mg/dL — ABNORMAL HIGH (ref 70–99)
Glucose-Capillary: 144 mg/dL — ABNORMAL HIGH (ref 70–99)
Glucose-Capillary: 142 mg/dL — ABNORMAL HIGH (ref 70–99)
Glucose-Capillary: 87 mg/dL (ref 70–99)
Glucose-Capillary: 241 mg/dL — ABNORMAL HIGH (ref 70–99)

## 2024-01-26 LAB — POCT ACTIVATED CLOTTING TIME: Activated Clotting Time: 158 s

## 2024-01-26 SURGERY — RIGHT/LEFT HEART CATH AND CORONARY ANGIOGRAPHY
Anesthesia: LOCAL

## 2024-01-26 MED ORDER — LIDOCAINE HCL (PF) 1 % IJ SOLN
INTRAMUSCULAR | Status: DC | PRN
  Administered 2024-01-26 (×2): 2 mL via INTRADERMAL
  Administered 2024-01-26: 5 mL via INTRADERMAL

## 2024-01-26 MED ORDER — MIDAZOLAM HCL 2 MG/2ML IJ SOLN
INTRAMUSCULAR | Status: AC
  Filled 2024-01-26: qty 2

## 2024-01-26 MED ORDER — AMLODIPINE BESYLATE 2.5 MG PO TABS
2.5000 mg | ORAL_TABLET | Freq: Every day | ORAL | Status: DC
  Administered 2024-01-26 – 2024-02-05 (×11): 2.5 mg via ORAL
  Filled 2024-01-26 (×11): qty 1

## 2024-01-26 MED ORDER — VERAPAMIL HCL 2.5 MG/ML IV SOLN
INTRAVENOUS | Status: DC | PRN
  Administered 2024-01-26: 10 mL via INTRA_ARTERIAL

## 2024-01-26 MED ORDER — FENTANYL CITRATE (PF) 100 MCG/2ML IJ SOLN
INTRAMUSCULAR | Status: DC | PRN
  Administered 2024-01-26: 25 ug via INTRAVENOUS

## 2024-01-26 MED ORDER — LABETALOL HCL 5 MG/ML IV SOLN: 10.0000 mg | INTRAVENOUS | Status: AC | PRN

## 2024-01-26 MED ORDER — HYDRALAZINE HCL 20 MG/ML IJ SOLN: 10.0000 mg | INTRAMUSCULAR | Status: AC | PRN

## 2024-01-26 MED ORDER — FREE WATER
500.0000 mL | Freq: Once | Status: AC
  Administered 2024-01-26: 500 mL via ORAL

## 2024-01-26 MED ORDER — FENTANYL CITRATE (PF) 100 MCG/2ML IJ SOLN
INTRAMUSCULAR | Status: AC
  Filled 2024-01-26: qty 2

## 2024-01-26 MED ORDER — HEPARIN BOLUS VIA INFUSION
4000.0000 [IU] | Freq: Once | INTRAVENOUS | Status: AC
  Administered 2024-01-26: 4000 [IU] via INTRAVENOUS
  Filled 2024-01-26: qty 4000

## 2024-01-26 MED ORDER — SODIUM CHLORIDE 0.9 % IV SOLN
250.0000 mL | INTRAVENOUS | Status: AC | PRN
Start: 2024-01-26 — End: 2024-01-27

## 2024-01-26 MED ORDER — HEPARIN (PORCINE) IN NACL 1000-0.9 UT/500ML-% IV SOLN
INTRAVENOUS | Status: DC | PRN
  Administered 2024-01-26 (×2): 500 mL

## 2024-01-26 MED ORDER — ACETAMINOPHEN 325 MG PO TABS
ORAL_TABLET | ORAL | Status: AC
  Filled 2024-01-26: qty 2

## 2024-01-26 MED ORDER — LIDOCAINE HCL (PF) 1 % IJ SOLN
INTRAMUSCULAR | Status: AC
Start: 2024-01-26 — End: 2024-01-26
  Filled 2024-01-26: qty 30

## 2024-01-26 MED ORDER — IOHEXOL 350 MG/ML SOLN
INTRAVENOUS | Status: DC | PRN
  Administered 2024-01-26: 58 mL

## 2024-01-26 MED ORDER — HEPARIN SODIUM (PORCINE) 1000 UNIT/ML IJ SOLN
INTRAMUSCULAR | Status: AC
  Filled 2024-01-26: qty 10

## 2024-01-26 MED ORDER — HEPARIN (PORCINE) 25000 UT/250ML-% IV SOLN
1350.0000 [IU]/h | INTRAVENOUS | Status: DC
  Administered 2024-01-26: 1350 [IU]/h via INTRAVENOUS
  Filled 2024-01-26: qty 250

## 2024-01-26 MED ORDER — SODIUM CHLORIDE 0.9% FLUSH
3.0000 mL | Freq: Two times a day (BID) | INTRAVENOUS | Status: DC
  Administered 2024-01-26 – 2024-02-04 (×16): 3 mL via INTRAVENOUS

## 2024-01-26 MED ORDER — ISOSORBIDE MONONITRATE ER 30 MG PO TB24
30.0000 mg | ORAL_TABLET | Freq: Every day | ORAL | Status: DC
  Administered 2024-01-26 – 2024-01-29 (×4): 30 mg via ORAL
  Filled 2024-01-26 (×4): qty 1

## 2024-01-26 MED ORDER — SODIUM CHLORIDE 0.9% FLUSH: 3.0000 mL | INTRAVENOUS | Status: DC | PRN

## 2024-01-26 MED ORDER — HEPARIN SODIUM (PORCINE) 1000 UNIT/ML IJ SOLN
INTRAMUSCULAR | Status: DC | PRN
  Administered 2024-01-26: 5000 [IU] via INTRAVENOUS

## 2024-01-26 MED ORDER — MIDAZOLAM HCL 2 MG/2ML IJ SOLN
INTRAMUSCULAR | Status: DC | PRN
  Administered 2024-01-26: 1 mg via INTRAVENOUS

## 2024-01-26 MED ORDER — VERAPAMIL HCL 2.5 MG/ML IV SOLN
INTRAVENOUS | Status: AC
  Filled 2024-01-26: qty 2

## 2024-01-26 SURGICAL SUPPLY — 12 items
CATH 5FR JL3.5 JR4 ANG PIG MP (CATHETERS) IMPLANT
CATH BALLN WEDGE 5F 110CM (CATHETERS) IMPLANT
GLIDESHEATH SLEND SS 6F .021 (SHEATH) IMPLANT
GUIDEWIRE .025 260CM (WIRE) IMPLANT
GUIDEWIRE INQWIRE 1.5J.035X260 (WIRE) IMPLANT
KIT MICROPUNCTURE NIT STIFF (SHEATH) IMPLANT
KIT SYRINGE INJ CVI SPIKEX1 (MISCELLANEOUS) IMPLANT
PACK CARDIAC CATHETERIZATION (CUSTOM PROCEDURE TRAY) ×1 IMPLANT
SET ATX-X65L (MISCELLANEOUS) IMPLANT
SHEATH GLIDE SLENDER 4/5FR (SHEATH) IMPLANT
SHEATH PINNACLE 5F 10CM (SHEATH) IMPLANT
SHEATH PROBE COVER 6X72 (BAG) IMPLANT

## 2024-01-26 NOTE — TOC CM/SW Note (Signed)
 Transition of Care [facility]) - Inpatient Brief Assessment   Patient Details  Name: GORO WENRICK MRN: [id number] Date of Birth: Sep 26, 1965  Transition of Care [facility]) CM/SW Contact:    Waddell Barnie Rama, RN Phone Number: 01/26/2024, 3:18 PM   Clinical Narrative: From home with spouse, has PCP, [facility] in [facility] [phone] and insurance on file, states has no HH services in place at this time or DME at home.  States family member will transport them home at [employer] and family is support system, states gets medications from [facility] on [address], or [facility].  Pta self ambulatory.   He is for heart cath today.  IP CM will continue to follow .     Transition of Care Asessment: Insurance and Status: Insurance coverage has been reviewed Patient has primary care physician: Yes Home environment has been reviewed: home with wife Prior level of function:: indep Prior/Current Home Services: No current home services Social Drivers of Health Review: SDOH reviewed no interventions necessary Readmission risk has been reviewed: Yes Transition of care needs: no transition of care needs at this time

## 2024-01-26 NOTE — Interval H&P Note (Signed)
 History and Physical Interval Note:  01/26/2024 4:10 PM  Colin Ramirez  has presented today for surgery, with the diagnosis of chest pain.  The various methods of treatment have been discussed with the patient and family. After consideration of risks, benefits and other options for treatment, the patient has consented to  Procedure(s): RIGHT/LEFT HEART CATH AND CORONARY ANGIOGRAPHY (N/A) as a surgical intervention.  The patient's history has been reviewed, patient examined, no change in status, stable for surgery.  I have reviewed the patient's chart and labs.  Questions were answered to the patient's satisfaction.    Cath Lab Visit (complete for each Cath Lab visit)  Clinical Evaluation Leading to the Procedure:   ACS: No.  Non-ACS:    Anginal Classification: CCS III  Anti-ischemic medical therapy: No Therapy  Non-Invasive Test Results: No non-invasive testing performed  Prior CABG: No previous CABG        [name]

## 2024-01-26 NOTE — Plan of Care (Signed)
   Problem: Education: Goal: Knowledge of General Education information will improve Description Including pain rating scale, medication(s)/side effects and non-pharmacologic comfort measures Outcome: Progressing   Problem: Health Behavior/Discharge Planning: Goal: Ability to manage health-related needs will improve Outcome: Progressing
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
## 2024-01-26 NOTE — Progress Notes (Signed)
 [facility] PROGRESS NOTE    Progress Note  Colin Ramirez  FMW:[id number] DOB: Jul 13, 1965 DOA: 01/24/2024 PCP: [name], MD (Inactive)     Brief Narrative:   Colin Ramirez is an 58 y.o. male past medical history significant for coronary artery disease, with a left heart cath on 10/28/2017 that showed two-vessel disease with moderate stenosis, diabetes mellitus type 2, essential hypertension admitted to [facility] when he started having chest pain intermittent nonradiating that started about 6 days prior to admission not associated with shortness of breath or palpitation he does relate recent nausea and vomiting.  Cardiac biomarkers are basically remained negative, twelve-lead EKG showed normal sinus rhythm normal axis no T wave abnormalities. Left heart cath on May 2019 that showed moderate two-vessel disease.  Assessment/Plan:   Atypical chest pain: 2D echo with preserved EF no wall motion abnormality, moderate aortic stenosis with a mean gradient of 16 mmHg Continue aspirin , beta-blockers and statins.  Nitroglycerin  as needed for chest pain Due to to his aneurysm dilation cardiology recommended a CT angio that showed ascending aortic aneurysm measuring 5 cm no dissection. They also recommended right and left heart cath 01/26/2024    Hypokalemia: Labs are pending this morning.  Diabetes mellitus type 2: Hemoglobin A1c was 7.4 Started on sliding scale insulin  with CBGs AC and at bedtime. Holding oral hypoglycemic agents including metformin   Essential hypertension: HCTZ was held on admission. Resume Coreg .  Hyperlipidemia: Continue statins.  GERD: Continue PPI    DVT prophylaxis: lovenox  Family Communication:noen Status is: Observation The patient remains OBS appropriate and will d/c before 2 midnights.    Code Status:     Code Status Orders  (From admission, onward)           Start     Ordered   01/24/24 2017  Full code   Continuous       Question:  By:  Answer:  Consent: discussion documented in EHR   01/24/24 2017           Code Status History     Date Active Date Inactive Code Status Order ID Comments User Context   06/07/2018 2112 06/08/2018 2158 Full Code [id number]  Lonzell Emeline HERO, DO ED   10/28/2017 1648 10/29/2017 1450 Full Code [id number]  Maxie Herlene POUR, PA-C ED   08/20/2016 1306 08/21/2016 1715 Full Code [id number]  Vernetta Lonni GRADE, MD Inpatient         IV Access:   Peripheral IV   Procedures and diagnostic studies:   ECHOCARDIOGRAM COMPLETE Result Date: 01/25/2024    ECHOCARDIOGRAM REPORT   Patient Name:   Colin Ramirez Date of Exam: 01/25/2024 Medical Rec #:  [id number]         Height:       73.0 in Accession #:    [id number]        Weight:       253.5 lb Date of Birth:  12-27-1965         BSA:          2.380 m Patient Age:    58 years          BP:           145/85 mmHg Patient Gender: M                 HR:           55 bpm. Exam Location:  Inpatient Procedure: 2D Echo, Cardiac Doppler, Color Doppler  and Intracardiac            Opacification Agent (Both Spectral and Color Flow Doppler were            utilized during procedure). Indications:    chest pain  History:        Patient has prior history of Echocardiogram examinations, most                 recent 06/08/2018. Risk Factors:Dyslipidemia and Hypertension.  Sonographer:    Therisa Crouch Referring Phys: [id number] JUSTIN B HOWERTER IMPRESSIONS  1. Left ventricular ejection fraction, by estimation, is 55 to 60%. The left ventricle has normal function. The left ventricle has no regional wall motion abnormalities. Left ventricular diastolic parameters were normal.  2. Calcified aortic valve. Given age and aortic dilation would be concerned for bicuspid valve. DVI 0.37. The aortic valve has an indeterminant number of cusps. Aortic valve regurgitation is not visualized. Mild to moderate aortic valve stenosis. Aortic  valve area, by VTI measures  1.62 cm. Aortic valve mean gradient measures 14.0 mmHg. Aortic valve Vmax measures 2.48 m/s.  3. Right ventricular systolic function is mildly reduced. The right ventricular size is normal.  4. Left atrial size was mildly dilated.  5. The mitral valve is normal in structure. No evidence of mitral valve regurgitation. No evidence of mitral stenosis.  6. Aortic dilatation noted. Aneurysm of the ascending aorta, measuring 51 mm.  7. The inferior vena cava is normal in size with greater than 50% respiratory variability, suggesting right atrial pressure of 3 mmHg. FINDINGS  Left Ventricle: Left ventricular ejection fraction, by estimation, is 55 to 60%. The left ventricle has normal function. The left ventricle has no regional wall motion abnormalities. The left ventricular internal cavity size was normal in size. There is  no left ventricular hypertrophy. Left ventricular diastolic parameters were normal. Right Ventricle: The right ventricular size is normal. No increase in right ventricular wall thickness. Right ventricular systolic function is mildly reduced. Left Atrium: Left atrial size was mildly dilated. Right Atrium: Right atrial size was normal in size. Pericardium: There is no evidence of pericardial effusion. Mitral Valve: The mitral valve is normal in structure. No evidence of mitral valve regurgitation. No evidence of mitral valve stenosis. Tricuspid Valve: The tricuspid valve is normal in structure. Tricuspid valve regurgitation is not demonstrated. No evidence of tricuspid stenosis. Aortic Valve: Calcified aortic valve. Given age and aortic dilation would be concerned for bicuspid valve. DVI 0.37. The aortic valve has an indeterminant number of cusps. Aortic valve regurgitation is not visualized. Mild to moderate aortic stenosis is present. Aortic valve mean gradient measures 14.0 mmHg. Aortic valve peak gradient measures 24.6 mmHg. Aortic valve area, by VTI measures 1.62 cm. Pulmonic Valve: The pulmonic  valve was normal in structure. Pulmonic valve regurgitation is not visualized. No evidence of pulmonic stenosis. Aorta: Aortic dilatation noted. There is an aneurysm involving the ascending aorta measuring 51 mm. Venous: The inferior vena cava is normal in size with greater than 50% respiratory variability, suggesting right atrial pressure of 3 mmHg. IAS/Shunts: No atrial level shunt detected by color flow Doppler.  LEFT VENTRICLE PLAX 2D LVIDd:         5.30 cm   Diastology LVIDs:         3.90 cm   LV e' medial:    7.46 cm/s LV PW:         1.30 cm   LV E/e' medial:  9.3 LV IVS:  1.40 cm   LV e' lateral:   9.95 cm/s LVOT diam:     2.30 cm   LV E/e' lateral: 7.0 LV SV:         91 LV SV Index:   38 LVOT Area:     4.15 cm  RIGHT VENTRICLE             IVC RV Basal diam:  3.60 cm     IVC diam: 1.80 cm RV S prime:     10.90 cm/s TAPSE (M-mode): 1.8 cm LEFT ATRIUM             Index LA diam:        3.80 cm 1.60 cm/m LA Vol (A2C):   49.3 ml 20.72 ml/m LA Vol (A4C):   90.4 ml 37.99 ml/m LA Biplane Vol: 67.0 ml 28.15 ml/m  AORTIC VALVE AV Area (Vmax):    1.59 cm AV Area (Vmean):   1.53 cm AV Area (VTI):     1.62 cm AV Vmax:           248.00 cm/s AV Vmean:          170.500 cm/s AV VTI:            0.560 m AV Peak Grad:      24.6 mmHg AV Mean Grad:      14.0 mmHg LVOT Vmax:         94.75 cm/s LVOT Vmean:        62.800 cm/s LVOT VTI:          0.218 m LVOT/AV VTI ratio: 0.39  AORTA Ao Root diam: 3.90 cm Ao Asc diam:  5.10 cm MITRAL VALVE MV Area (PHT): 3.61 cm    SHUNTS MV Decel Time: 210 msec    Systemic VTI:  0.22 m MV E velocity: 69.40 cm/s  Systemic Diam: 2.30 cm MV A velocity: 68.80 cm/s MV E/A ratio:  1.01 Morene Brownie Electronically signed by Morene Brownie Signature Date/Time: 01/25/2024/6:40:07 PM    Final    CT ANGIO CHEST AORTA W/CM & OR WO/CM Result Date: 01/25/2024 CLINICAL DATA:  Ascending aorta dilation. EXAM: CT ANGIOGRAPHY CHEST WITH CONTRAST TECHNIQUE: Multidetector CT imaging of the chest was  performed using the standard protocol during bolus administration of intravenous contrast. Multiplanar CT image reconstructions and MIPs were obtained to evaluate the vascular anatomy. RADIATION DOSE REDUCTION: This exam was performed according to the departmental dose-optimization program which includes automated exposure control, adjustment of the mA and/or kV according to patient size and/or use of iterative reconstruction technique. CONTRAST:  75mL OMNIPAQUE  IOHEXOL  350 MG/ML SOLN COMPARISON:  CT scan chest from 06/03/2006. FINDINGS: Cardiovascular: Preferential opacification of the thoracic aorta. Normal heart size. No pericardial effusion. There are coronary artery calcifications, in keeping with coronary artery disease. There are also mild peripheral atherosclerotic vascular calcifications of thoracic aorta and its major branches. The measurements of thoracic aorta are as follows: *At the level of annulus 2.9 cm. *At the level of sinus of Valsalva-4.9 cm. *At the level of sinotubular junction-4.5 cm. *Ascending aorta at the level of main pulmonary trunk-4.9 cm. *Proximal descending thoracic aorta-3.3 cm. *Aorta at the level of diaphragmatic hiatus-3.1 cm. Mediastinum/Nodes: Visualized thyroid  gland appears grossly unremarkable. No solid / cystic mediastinal masses. The esophagus is nondistended precluding optimal assessment. No axillary, mediastinal or hilar lymphadenopathy by size criteria. Lungs/Pleura: The central tracheo-bronchial tree is patent. There are patchy areas of linear, plate-like atelectasis and/or scarring throughout bilateral lungs. No mass or  consolidation. No pleural effusion or pneumothorax. No suspicious lung nodules. Upper Abdomen: There is diffuse thickening of bilateral adrenal glands, without discrete nodule. Findings are nonspecific but mostly associated with adrenal hyperplasia. Remaining visualized upper abdominal viscera within normal limits. Musculoskeletal: The visualized soft  tissues of the chest wall are grossly unremarkable. No suspicious osseous lesions. There are mild multilevel degenerative changes in the visualized spine. IMPRESSION: 1. There is aneurysmal dilation of ascending thoracic aorta measuring up to 4.9 cm in diameter. No dissection or penetrating ulcer. 2. Multiple other nonacute observations, as described above. Aortic aneurysm NOS (ICD10-I71.9). Aortic Atherosclerosis (ICD10-I70.0). Electronically Signed   By: Ree Molt M.D.   On: 01/25/2024 16:03   DG Chest 2 View Result Date: 01/24/2024 CLINICAL DATA:  chest pain. EXAM: CHEST - 2 VIEW COMPARISON:  02/03/2022. FINDINGS: Low lung volume. Bilateral lung fields are clear. Bilateral costophrenic angles are clear. Stable cardio-mediastinal silhouette. No acute osseous abnormalities. The soft tissues are within normal limits. IMPRESSION: No active cardiopulmonary disease. Electronically Signed   By: Ree Molt M.D.   On: 01/24/2024 16:23     Medical Consultants:   None.   Subjective:    Colin Ramirez no chest pain or shortness of breath  Objective:    Vitals:   01/25/24 1610 01/25/24 2030 01/25/24 2341 01/26/24 0455  BP: 125/74 (!) 117/97 130/80 (!) 144/84  Pulse: 60 61  64  Resp: (!) 24 15 14 13   Temp: 98 F (36.7 C) 98.3 F (36.8 C) 97.7 F (36.5 C) 97.6 F (36.4 C)  TempSrc: Oral Oral Oral Oral  SpO2: 100% 97% 98% 99%  Weight:    108.7 kg  Height:       SpO2: 99 %   Intake/Output Summary (Last 24 hours) at 01/26/2024 0734 Last data filed at 01/26/2024 0514 Gross per 24 hour  Intake 800 ml  Output 600 ml  Net 200 ml   Filed Weights   01/24/24 1526 01/25/24 0429 01/26/24 0455  Weight: 115 kg 115 kg 108.7 kg    Exam: General exam: In no acute distress. Respiratory system: Good air movement and clear to auscultation. Cardiovascular system: S1 & S2 heard, RRR. No JVD. Gastrointestinal system: Abdomen is nondistended, soft and nontender.  Extremities: No pedal  edema. Skin: No rashes, lesions or ulcers Psychiatry: Judgement and insight appear normal. Mood & affect appropriate.  Data Reviewed:    Labs: Basic Metabolic Panel: Recent Labs  Lab 01/24/24 1557 01/24/24 1803 01/24/24 1956 01/25/24 0511  NA 136  --   --  138  K 2.5*  --  3.0* 3.5  CL 97*  --   --  98  CO2 19*  --   --  28  GLUCOSE 202*  --   --  147*  BUN 19  --   --  16  CREATININE 1.32*  --   --  0.93  CALCIUM  8.7*  --   --  9.0  MG  --  2.1  --  2.0   GFR Estimated Creatinine Clearance: 109.2 mL/min (by C-G formula based on SCr of 0.93 mg/dL). Liver Function Tests: Recent Labs  Lab 01/25/24 0511  AST 19  ALT 20  ALKPHOS 44  BILITOT 0.7  PROT 5.9*  ALBUMIN 3.3*   Recent Labs  Lab 01/25/24 0511  LIPASE 37   No results for input(s): AMMONIA in the last 168 hours. Coagulation profile No results for input(s): INR, PROTIME in the last 168 hours. COVID-19 Labs  No results for input(s): DDIMER, FERRITIN, LDH, CRP in the last 72 hours.  Lab Results  Component Value Date   SARSCOV2NAA RESULT: NEGATIVE 08/24/2019   SARSCOV2NAA Not Detected 05/11/2019   SARSCOV2NAA Not Detected 01/17/2019    CBC: Recent Labs  Lab 01/24/24 1557 01/25/24 0511  WBC 10.1 6.1  NEUTROABS  --  3.6  HGB 15.0 14.3  HCT 46.3 42.8  MCV 90.1 89.4  PLT 300 225   Cardiac Enzymes: No results for input(s): CKTOTAL, CKMB, CKMBINDEX, TROPONINI in the last 168 hours. BNP (last 3 results) No results for input(s): PROBNP in the last 8760 hours. CBG: Recent Labs  Lab 01/25/24 1235 01/25/24 1605 01/25/24 2114 01/25/24 2344 01/26/24 0603  GLUCAP 157* 208* 258* 194* 167*   D-Dimer: No results for input(s): DDIMER in the last 72 hours. Hgb A1c: Recent Labs    01/25/24 0511  HGBA1C 7.4*   Lipid Profile: Recent Labs    01/26/24 0227  CHOL 144  HDL 39*  LDLCALC 46  TRIG 703*  CHOLHDL 3.7   Thyroid  function studies: No results for input(s):  TSH, T4TOTAL, T3FREE, THYROIDAB in the last 72 hours.  Invalid input(s): FREET3 Anemia work up: No results for input(s): VITAMINB12, FOLATE, FERRITIN, TIBC, IRON, RETICCTPCT in the last 72 hours. Sepsis Labs: Recent Labs  Lab 01/24/24 1557 01/25/24 0511  WBC 10.1 6.1   Microbiology No results found for this or any previous visit (from the past 240 hours).   Medications:    [START ON 01/27/2024] aspirin  EC  81 mg Oral Daily   atorvastatin   40 mg Oral Daily   carvedilol   6.25 mg Oral BID WC   insulin  aspart  0-5 Units Subcutaneous QHS   insulin  aspart  0-9 Units Subcutaneous TID WC   levothyroxine   175 mcg Oral Daily   nitroGLYCERIN   0.4 mg Sublingual Once   pantoprazole   40 mg Oral Daily   ramipril   10 mg Oral Daily   Continuous Infusions:    LOS: 0 days   Colin Ramirez  [facility]  01/26/2024, 7:34 AM

## 2024-01-26 NOTE — Progress Notes (Addendum)
 Site area: right groin 29F venous sheath Site Prior to Removal:  Level 0 Pressure Applied For:10 minutes Manual:   yes Patient Status During Pull:  stable  Post Pull Site:  Level 0 Post Pull Instructions Given:  yes Post Pull Pulses Present: right dp palpable Dressing Applied:  gauze and tegaderm Bedrest begins @ 1745 Comments:
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
## 2024-01-26 NOTE — Progress Notes (Signed)
 Mobility Specialist Progress Note:    01/26/24 0943  Mobility  Activity Ambulated with assistance  Level of Assistance Standby assist, set-up cues, supervision of patient - no hands on  Assistive Device None  Distance Ambulated (ft) 600 ft  Activity Response Tolerated well  Mobility Referral Yes  Mobility visit 1 Mobility  Mobility Specialist Start Time (ACUTE ONLY) [id number]  Mobility Specialist Stop Time (ACUTE ONLY) [id number]  Mobility Specialist Time Calculation (min) (ACUTE ONLY) 9 min   Pt pleasant and agreeable to session. No c/o any symptoms. Pt returned to room to speak w/ chaplain and family.   Colin Ramirez Mobility Specialist Please Neurosurgeon or Rehab Office at [phone]

## 2024-01-26 NOTE — Progress Notes (Signed)
   01/26/24 1019  Spiritual Encounters  Type of Visit Initial  Care provided to: Pt and family  Reason for visit Advance directives  OnCall Visit No   Chaplain visited with Patient and provided spiritual care and comfort. Patient was concerned about an upcoming medical procedure and some complicated dynamics in his family. Chaplain then met with Patient and his wife and provided AD documentation and education. When Patient and his wife had completed the AD doc, the Chaplain brought the notary and two witnesses to finalize the AD doc process. Chaplain provided three copies of completed AD to Patient and his wife along with the original completed AD. Chaplain provided nursing station with a copy of the completed AD and Chaplain scanned the completed AD to the [place] office. No further spiritual need at this time.  Chaplain Therisa Samuel
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
## 2024-01-27 ENCOUNTER — Inpatient Hospital Stay (HOSPITAL_COMMUNITY): Payer: PRIVATE HEALTH INSURANCE

## 2024-01-27 DIAGNOSIS — Z79890 Hormone replacement therapy (postmenopausal): Secondary | ICD-10-CM | POA: Diagnosis not present

## 2024-01-27 DIAGNOSIS — I5031 Acute diastolic (congestive) heart failure: Secondary | ICD-10-CM | POA: Diagnosis not present

## 2024-01-27 DIAGNOSIS — D62 Acute posthemorrhagic anemia: Secondary | ICD-10-CM | POA: Diagnosis not present

## 2024-01-27 DIAGNOSIS — E1165 Type 2 diabetes mellitus with hyperglycemia: Secondary | ICD-10-CM | POA: Diagnosis present

## 2024-01-27 DIAGNOSIS — I712 Thoracic aortic aneurysm, without rupture, unspecified: Secondary | ICD-10-CM | POA: Diagnosis present

## 2024-01-27 DIAGNOSIS — I9589 Other hypotension: Secondary | ICD-10-CM | POA: Diagnosis not present

## 2024-01-27 DIAGNOSIS — Q2543 Congenital aneurysm of aorta: Secondary | ICD-10-CM | POA: Diagnosis not present

## 2024-01-27 DIAGNOSIS — I350 Nonrheumatic aortic (valve) stenosis: Secondary | ICD-10-CM | POA: Diagnosis present

## 2024-01-27 DIAGNOSIS — E785 Hyperlipidemia, unspecified: Secondary | ICD-10-CM | POA: Diagnosis present

## 2024-01-27 DIAGNOSIS — Z720 Tobacco use: Secondary | ICD-10-CM

## 2024-01-27 DIAGNOSIS — M1990 Unspecified osteoarthritis, unspecified site: Secondary | ICD-10-CM

## 2024-01-27 DIAGNOSIS — Q2381 Bicuspid aortic valve: Secondary | ICD-10-CM

## 2024-01-27 DIAGNOSIS — I7121 Aneurysm of the ascending aorta, without rupture: Secondary | ICD-10-CM | POA: Diagnosis not present

## 2024-01-27 DIAGNOSIS — I25110 Atherosclerotic heart disease of native coronary artery with unstable angina pectoris: Secondary | ICD-10-CM | POA: Diagnosis present

## 2024-01-27 DIAGNOSIS — I442 Atrioventricular block, complete: Secondary | ICD-10-CM | POA: Diagnosis present

## 2024-01-27 DIAGNOSIS — I25119 Atherosclerotic heart disease of native coronary artery with unspecified angina pectoris: Secondary | ICD-10-CM | POA: Diagnosis not present

## 2024-01-27 DIAGNOSIS — E1159 Type 2 diabetes mellitus with other circulatory complications: Secondary | ICD-10-CM | POA: Diagnosis not present

## 2024-01-27 DIAGNOSIS — Z01810 Encounter for preprocedural cardiovascular examination: Secondary | ICD-10-CM | POA: Diagnosis not present

## 2024-01-27 DIAGNOSIS — E669 Obesity, unspecified: Secondary | ICD-10-CM

## 2024-01-27 DIAGNOSIS — I5033 Acute on chronic diastolic (congestive) heart failure: Secondary | ICD-10-CM | POA: Diagnosis not present

## 2024-01-27 DIAGNOSIS — Q250 Patent ductus arteriosus: Secondary | ICD-10-CM | POA: Diagnosis not present

## 2024-01-27 DIAGNOSIS — J9811 Atelectasis: Secondary | ICD-10-CM | POA: Diagnosis not present

## 2024-01-27 DIAGNOSIS — R0789 Other chest pain: Secondary | ICD-10-CM | POA: Diagnosis not present

## 2024-01-27 DIAGNOSIS — N179 Acute kidney failure, unspecified: Secondary | ICD-10-CM | POA: Diagnosis present

## 2024-01-27 DIAGNOSIS — Z9889 Other specified postprocedural states: Secondary | ICD-10-CM

## 2024-01-27 DIAGNOSIS — Z86718 Personal history of other venous thrombosis and embolism: Secondary | ICD-10-CM

## 2024-01-27 DIAGNOSIS — Z951 Presence of aortocoronary bypass graft: Secondary | ICD-10-CM | POA: Diagnosis not present

## 2024-01-27 DIAGNOSIS — G8929 Other chronic pain: Secondary | ICD-10-CM | POA: Diagnosis present

## 2024-01-27 DIAGNOSIS — K219 Gastro-esophageal reflux disease without esophagitis: Secondary | ICD-10-CM | POA: Diagnosis present

## 2024-01-27 DIAGNOSIS — F17210 Nicotine dependence, cigarettes, uncomplicated: Secondary | ICD-10-CM | POA: Diagnosis present

## 2024-01-27 DIAGNOSIS — I110 Hypertensive heart disease with heart failure: Secondary | ICD-10-CM | POA: Diagnosis present

## 2024-01-27 DIAGNOSIS — E119 Type 2 diabetes mellitus without complications: Secondary | ICD-10-CM | POA: Diagnosis not present

## 2024-01-27 DIAGNOSIS — E039 Hypothyroidism, unspecified: Secondary | ICD-10-CM | POA: Diagnosis present

## 2024-01-27 DIAGNOSIS — I10 Essential (primary) hypertension: Secondary | ICD-10-CM | POA: Diagnosis not present

## 2024-01-27 DIAGNOSIS — K029 Dental caries, unspecified: Secondary | ICD-10-CM | POA: Diagnosis not present

## 2024-01-27 DIAGNOSIS — I200 Unstable angina: Secondary | ICD-10-CM | POA: Diagnosis not present

## 2024-01-27 DIAGNOSIS — Z794 Long term (current) use of insulin: Secondary | ICD-10-CM | POA: Diagnosis not present

## 2024-01-27 DIAGNOSIS — G4733 Obstructive sleep apnea (adult) (pediatric): Secondary | ICD-10-CM

## 2024-01-27 DIAGNOSIS — I2510 Atherosclerotic heart disease of native coronary artery without angina pectoris: Secondary | ICD-10-CM | POA: Diagnosis not present

## 2024-01-27 DIAGNOSIS — R079 Chest pain, unspecified: Secondary | ICD-10-CM | POA: Diagnosis present

## 2024-01-27 DIAGNOSIS — E876 Hypokalemia: Secondary | ICD-10-CM | POA: Diagnosis present

## 2024-01-27 DIAGNOSIS — E1142 Type 2 diabetes mellitus with diabetic polyneuropathy: Secondary | ICD-10-CM | POA: Diagnosis present

## 2024-01-27 DIAGNOSIS — D72829 Elevated white blood cell count, unspecified: Secondary | ICD-10-CM | POA: Diagnosis present

## 2024-01-27 DIAGNOSIS — F17200 Nicotine dependence, unspecified, uncomplicated: Secondary | ICD-10-CM | POA: Diagnosis not present

## 2024-01-27 DIAGNOSIS — Z7984 Long term (current) use of oral hypoglycemic drugs: Secondary | ICD-10-CM | POA: Diagnosis not present

## 2024-01-27 LAB — CBC
HCT: 39.6 % (ref 39.0–52.0)
Hemoglobin: 12.9 g/dL — ABNORMAL LOW (ref 13.0–17.0)
MCH: 29.3 pg (ref 26.0–34.0)
MCHC: 32.6 g/dL (ref 30.0–36.0)
MCV: 89.8 fL (ref 80.0–100.0)
Platelets: 219 K/uL (ref 150–400)
RBC: 4.41 MIL/uL (ref 4.22–5.81)
RDW: 15.9 % — ABNORMAL HIGH (ref 11.5–15.5)
WBC: 6.2 K/uL (ref 4.0–10.5)
nRBC: 0 % (ref 0.0–0.2)

## 2024-01-27 LAB — GLUCOSE, CAPILLARY
Glucose-Capillary: 226 mg/dL — ABNORMAL HIGH (ref 70–99)
Glucose-Capillary: 222 mg/dL — ABNORMAL HIGH (ref 70–99)
Glucose-Capillary: 232 mg/dL — ABNORMAL HIGH (ref 70–99)
Glucose-Capillary: 222 mg/dL — ABNORMAL HIGH (ref 70–99)

## 2024-01-27 LAB — BASIC METABOLIC PANEL WITH GFR
Anion gap: 9 (ref 5–15)
BUN: 15 mg/dL (ref 6–20)
CO2: 27 mmol/L (ref 22–32)
Calcium: 8.6 mg/dL — ABNORMAL LOW (ref 8.9–10.3)
Chloride: 102 mmol/L (ref 98–111)
Creatinine, Ser: 0.72 mg/dL (ref 0.61–1.24)
GFR, Estimated: >60 mL/min (ref >60)
Glucose, Bld: 166 mg/dL — ABNORMAL HIGH (ref 70–99)
Potassium: 3.5 mmol/L (ref 3.5–5.1)
Sodium: 138 mmol/L (ref 135–145)

## 2024-01-27 LAB — HEPARIN LEVEL (UNFRACTIONATED)
Heparin Unfractionated: 0.14 [IU]/mL — ABNORMAL LOW (ref 0.30–0.70)
Heparin Unfractionated: 0.35 [IU]/mL (ref 0.30–0.70)

## 2024-01-27 MED ORDER — INSULIN GLARGINE-YFGN 100 UNIT/ML ~~LOC~~ SOLN
5.0000 [IU] | Freq: Every day | SUBCUTANEOUS | Status: DC
  Administered 2024-01-27: 5 [IU] via SUBCUTANEOUS
  Filled 2024-01-27 (×2): qty 0.05

## 2024-01-27 MED ORDER — HEPARIN (PORCINE) 25000 UT/250ML-% IV SOLN
1650.0000 [IU]/h | INTRAVENOUS | Status: DC
  Administered 2024-01-27: 1450 [IU]/h via INTRAVENOUS
  Administered 2024-01-28 – 2024-01-31 (×6): 1650 [IU]/h via INTRAVENOUS
  Filled 2024-01-27 (×7): qty 250

## 2024-01-27 MED ORDER — INSULIN ASPART 100 UNIT/ML IJ SOLN
3.0000 [IU] | Freq: Three times a day (TID) | INTRAMUSCULAR | Status: DC
  Administered 2024-01-27 – 2024-02-05 (×26): 3 [IU] via SUBCUTANEOUS

## 2024-01-27 MED ORDER — HEPARIN BOLUS VIA INFUSION
4000.0000 [IU] | Freq: Once | INTRAVENOUS | Status: AC
  Administered 2024-01-27: 4000 [IU] via INTRAVENOUS
  Filled 2024-01-27: qty 4000

## 2024-01-27 MED ORDER — LORAZEPAM 0.5 MG PO TABS: 0.5000 mg | ORAL_TABLET | Freq: Two times a day (BID) | ORAL | Status: DC | PRN

## 2024-01-27 MED ORDER — INSULIN GLARGINE-YFGN 100 UNIT/ML ~~LOC~~ SOPN: 5.0000 [IU] | PEN_INJECTOR | SUBCUTANEOUS | Status: DC
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
## 2024-01-27 NOTE — Heart Team MDD (Signed)
   Heart Team Multi-Disciplinary Discussion  Patient: Colin Ramirez  DOB: 1966/03/02  MRN: [id number]   Date: 01/27/2024  10:19 AM    Attendees: Interventional Cardiology: Deatrice Cage, MD Debby Como, MD Alm Clay, MD Gordy Bergamo, MD Newman Lawrence, MD Peter [place], MD Cara Lovelace, MD  Cardiothoracic Surgery: Linnie Rayas, MD     Patient History: 58 y.o. male with complaint of angina x 6 days. Pt has known bicuspid aortic valve with mild to moderate stenosis and an ascending thoracic aneurysm of 4.9 cm.    Risk Factors: Diabetes Mellitus Hypertension Tobacco Abuse Hyperlipidemia     Review of Prior Angiography and PCI Procedures: Right and Left heart cath and coronary angiography completed on 01/26/24 images were reviewed and discussed in detail including: Severe mid LAD stenosis. Severe proximal Circumflex stenosis and severe stenosis involving the most distal obtuse marginal branch.  Large dominant RCA with moderate mid and distal stenosis. Severe stenosis in the moderate caliber posterolateral artery and severe stenosis in the moderate caliber PDA. Mild elevation of right and left heart pressures. Mild to moderate aortic stenosis (14 mm Hg peak to peak gradient, 16 mmHg mean gradient).     Discussion: After presentation, consideration of treatment options occurred. The team noted that there is no evidence of MI but symptoms of progressive angina despite optimal medical therapy. Given the fact that the patient also has thoracic aortic aneurysm 4.9 cm and biscupid AV with mild to moderate AS in addition to complex stenosis in the proximal to mid LAD spanning 2 large diagonal branches and disease in small OM and distal RCA, team consensus was for referral to CVTS for surgery to complete CABG/AVR/Bentall procedure.   Recommendations: CABG     Ellouise Elnor Myron, RN  01/27/2024 10:19 AM
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
## 2024-01-27 NOTE — Progress Notes (Addendum)
 [facility] PROGRESS NOTE    Progress Note  Colin Ramirez  FMW:[id number] DOB: Oct 14, 1965 DOA: 01/24/2024 PCP: [name], MD (Inactive)     Brief Narrative:   Colin Ramirez is an 58 y.o. male past medical history significant for coronary artery disease, with a left heart cath on 10/28/2017 that showed two-vessel disease with moderate stenosis, diabetes mellitus type 2, essential hypertension admitted to [facility] when he started having chest pain intermittent nonradiating that started about 6 days prior to admission not associated with shortness of breath or palpitation he does relate recent nausea and vomiting.  Cardiac biomarkers are basically remained negative, twelve-lead EKG showed normal sinus rhythm normal axis no T wave abnormalities. Left heart cath on May 2019 that showed moderate two-vessel disease.  Assessment/Plan:   Unstable angina: Continue aspirin , beta-blockers and statins.  Nitroglycerin  as needed for chest pain Left heart cath showed multivessel disease. Cardiology recommended CT surgery evaluation for possible CABG and Bentall procedure CT angio that showed ascending aortic aneurysm measuring 5 cm no dissection. Further management per cardiology.  Hypokalemia: Labs are pending this morning.  AKI: Likely hemodynamic mediated. Improve with IV fluids.  Uncontrolled Diabetes mellitus type 2: Hemoglobin A1c was 7.4 Started on sliding scale insulin  with CBGs AC and at bedtime. Holding oral hypoglycemic agents including metformin   Essential hypertension: HCTZ was held on admission. Resume Coreg .  Hyperlipidemia: Continue statins.  GERD: Continue PPI  Morbid obesity with a BMI greater than 30: Noted.    DVT prophylaxis: lovenox  Family Communication:noen Status is: Observation The patient remains OBS appropriate and will d/c before 2 midnights.    Code Status:     Code Status Orders  (From admission, onward)            Start     Ordered   01/24/24 2017  Full code  Continuous       Question:  By:  Answer:  Consent: discussion documented in EHR   01/24/24 2017           Code Status History     Date Active Date Inactive Code Status Order ID Comments User Context   06/07/2018 2112 06/08/2018 2158 Full Code [id number]  Lonzell Emeline HERO, DO ED   10/28/2017 1648 10/29/2017 1450 Full Code [id number]  Maxie Herlene POUR, PA-C ED   08/20/2016 1306 08/21/2016 1715 Full Code [id number]  Vernetta Lonni GRADE, MD Inpatient         IV Access:   Peripheral IV   Procedures and diagnostic studies:   CARDIAC CATHETERIZATION Result Date: 01/26/2024   Prox LAD to Mid LAD lesion is 75% stenosed.   Ost 1st Diag to 1st Diag lesion is 30% stenosed.   Ost 2nd Diag to 2nd Diag lesion is 30% stenosed.   Mid RCA to Dist RCA lesion is 50% stenosed.   Prox RCA lesion is 40% stenosed.   RPDA lesion is 90% stenosed.   RPAV lesion is 70% stenosed.   Dist RCA lesion is 50% stenosed.   Prox Cx lesion is 90% stenosed.   Ost 3rd Mrg lesion is 90% stenosed.   Prox LAD lesion is 70% stenosed. Severe mid LAD stenosis Severe proximal Circumflex stenosis and severe stenosis involving the most distal obtuse marginal branch. Large dominant RCA with moderate mid and distal stenosis. Severe stenosis in the moderate caliber posterolateral artery and severe stenosis in the moderate caliber PDA. Mild elevation of right and left heart pressures Mild to moderate aortic stenosis (14  mm Hg peak to peak gradient, 16 mmHg mean gradient). Recommendations: Will review cath images with the IC team tomorrow. Given multi-vessel CAD, dilated aortic root and moderate aortic stenosis, will need to consider CT surgery consult for Bentall procedure + CABG. We will call the CT surgery team tomorrow given the late timing of the case being finished tonight.   ECHOCARDIOGRAM COMPLETE Result Date: 01/25/2024    ECHOCARDIOGRAM REPORT   Patient Name:   Colin Ramirez Date of Exam: 01/25/2024 Medical Rec #:  [id number]         Height:       73.0 in Accession #:    [id number]        Weight:       253.5 lb Date of Birth:  07-25-1965         BSA:          2.380 m Patient Age:    58 years          BP:           145/85 mmHg Patient Gender: M                 HR:           55 bpm. Exam Location:  Inpatient Procedure: 2D Echo, Cardiac Doppler, Color Doppler and Intracardiac            Opacification Agent (Both Spectral and Color Flow Doppler were            utilized during procedure). Indications:    chest pain  History:        Patient has prior history of Echocardiogram examinations, most                 recent 06/08/2018. Risk Factors:Dyslipidemia and Hypertension.  Sonographer:    Therisa Crouch Referring Phys: [id number] JUSTIN B HOWERTER IMPRESSIONS  1. Left ventricular ejection fraction, by estimation, is 55 to 60%. The left ventricle has normal function. The left ventricle has no regional wall motion abnormalities. Left ventricular diastolic parameters were normal.  2. Calcified aortic valve. Given age and aortic dilation would be concerned for bicuspid valve. DVI 0.37. The aortic valve has an indeterminant number of cusps. Aortic valve regurgitation is not visualized. Mild to moderate aortic valve stenosis. Aortic  valve area, by VTI measures 1.62 cm. Aortic valve mean gradient measures 14.0 mmHg. Aortic valve Vmax measures 2.48 m/s.  3. Right ventricular systolic function is mildly reduced. The right ventricular size is normal.  4. Left atrial size was mildly dilated.  5. The mitral valve is normal in structure. No evidence of mitral valve regurgitation. No evidence of mitral stenosis.  6. Aortic dilatation noted. Aneurysm of the ascending aorta, measuring 51 mm.  7. The inferior vena cava is normal in size with greater than 50% respiratory variability, suggesting right atrial pressure of 3 mmHg. FINDINGS  Left Ventricle: Left ventricular ejection fraction, by estimation, is  55 to 60%. The left ventricle has normal function. The left ventricle has no regional wall motion abnormalities. The left ventricular internal cavity size was normal in size. There is  no left ventricular hypertrophy. Left ventricular diastolic parameters were normal. Right Ventricle: The right ventricular size is normal. No increase in right ventricular wall thickness. Right ventricular systolic function is mildly reduced. Left Atrium: Left atrial size was mildly dilated. Right Atrium: Right atrial size was normal in size. Pericardium: There is no evidence of pericardial effusion. Mitral Valve: The mitral valve  is normal in structure. No evidence of mitral valve regurgitation. No evidence of mitral valve stenosis. Tricuspid Valve: The tricuspid valve is normal in structure. Tricuspid valve regurgitation is not demonstrated. No evidence of tricuspid stenosis. Aortic Valve: Calcified aortic valve. Given age and aortic dilation would be concerned for bicuspid valve. DVI 0.37. The aortic valve has an indeterminant number of cusps. Aortic valve regurgitation is not visualized. Mild to moderate aortic stenosis is present. Aortic valve mean gradient measures 14.0 mmHg. Aortic valve peak gradient measures 24.6 mmHg. Aortic valve area, by VTI measures 1.62 cm. Pulmonic Valve: The pulmonic valve was normal in structure. Pulmonic valve regurgitation is not visualized. No evidence of pulmonic stenosis. Aorta: Aortic dilatation noted. There is an aneurysm involving the ascending aorta measuring 51 mm. Venous: The inferior vena cava is normal in size with greater than 50% respiratory variability, suggesting right atrial pressure of 3 mmHg. IAS/Shunts: No atrial level shunt detected by color flow Doppler.  LEFT VENTRICLE PLAX 2D LVIDd:         5.30 cm   Diastology LVIDs:         3.90 cm   LV e' medial:    7.46 cm/s LV PW:         1.30 cm   LV E/e' medial:  9.3 LV IVS:        1.40 cm   LV e' lateral:   9.95 cm/s LVOT diam:      2.30 cm   LV E/e' lateral: 7.0 LV SV:         91 LV SV Index:   38 LVOT Area:     4.15 cm  RIGHT VENTRICLE             IVC RV Basal diam:  3.60 cm     IVC diam: 1.80 cm RV S prime:     10.90 cm/s TAPSE (M-mode): 1.8 cm LEFT ATRIUM             Index LA diam:        3.80 cm 1.60 cm/m LA Vol (A2C):   49.3 ml 20.72 ml/m LA Vol (A4C):   90.4 ml 37.99 ml/m LA Biplane Vol: 67.0 ml 28.15 ml/m  AORTIC VALVE AV Area (Vmax):    1.59 cm AV Area (Vmean):   1.53 cm AV Area (VTI):     1.62 cm AV Vmax:           248.00 cm/s AV Vmean:          170.500 cm/s AV VTI:            0.560 m AV Peak Grad:      24.6 mmHg AV Mean Grad:      14.0 mmHg LVOT Vmax:         94.75 cm/s LVOT Vmean:        62.800 cm/s LVOT VTI:          0.218 m LVOT/AV VTI ratio: 0.39  AORTA Ao Root diam: 3.90 cm Ao Asc diam:  5.10 cm MITRAL VALVE MV Area (PHT): 3.61 cm    SHUNTS MV Decel Time: 210 msec    Systemic VTI:  0.22 m MV E velocity: 69.40 cm/s  Systemic Diam: 2.30 cm MV A velocity: 68.80 cm/s MV E/A ratio:  1.01 Morene Brownie Electronically signed by Morene Brownie Signature Date/Time: 01/25/2024/6:40:07 PM    Final    CT ANGIO CHEST AORTA W/CM & OR WO/CM Result Date: 01/25/2024 CLINICAL DATA:  Ascending aorta  dilation. EXAM: CT ANGIOGRAPHY CHEST WITH CONTRAST TECHNIQUE: Multidetector CT imaging of the chest was performed using the standard protocol during bolus administration of intravenous contrast. Multiplanar CT image reconstructions and MIPs were obtained to evaluate the vascular anatomy. RADIATION DOSE REDUCTION: This exam was performed according to the departmental dose-optimization program which includes automated exposure control, adjustment of the mA and/or kV according to patient size and/or use of iterative reconstruction technique. CONTRAST:  75mL OMNIPAQUE  IOHEXOL  350 MG/ML SOLN COMPARISON:  CT scan chest from 06/03/2006. FINDINGS: Cardiovascular: Preferential opacification of the thoracic aorta. Normal heart size. No pericardial  effusion. There are coronary artery calcifications, in keeping with coronary artery disease. There are also mild peripheral atherosclerotic vascular calcifications of thoracic aorta and its major branches. The measurements of thoracic aorta are as follows: *At the level of annulus 2.9 cm. *At the level of sinus of Valsalva-4.9 cm. *At the level of sinotubular junction-4.5 cm. *Ascending aorta at the level of main pulmonary trunk-4.9 cm. *Proximal descending thoracic aorta-3.3 cm. *Aorta at the level of diaphragmatic hiatus-3.1 cm. Mediastinum/Nodes: Visualized thyroid  gland appears grossly unremarkable. No solid / cystic mediastinal masses. The esophagus is nondistended precluding optimal assessment. No axillary, mediastinal or hilar lymphadenopathy by size criteria. Lungs/Pleura: The central tracheo-bronchial tree is patent. There are patchy areas of linear, plate-like atelectasis and/or scarring throughout bilateral lungs. No mass or consolidation. No pleural effusion or pneumothorax. No suspicious lung nodules. Upper Abdomen: There is diffuse thickening of bilateral adrenal glands, without discrete nodule. Findings are nonspecific but mostly associated with adrenal hyperplasia. Remaining visualized upper abdominal viscera within normal limits. Musculoskeletal: The visualized soft tissues of the chest wall are grossly unremarkable. No suspicious osseous lesions. There are mild multilevel degenerative changes in the visualized spine. IMPRESSION: 1. There is aneurysmal dilation of ascending thoracic aorta measuring up to 4.9 cm in diameter. No dissection or penetrating ulcer. 2. Multiple other nonacute observations, as described above. Aortic aneurysm NOS (ICD10-I71.9). Aortic Atherosclerosis (ICD10-I70.0). Electronically Signed   By: Ree Molt M.D.   On: 01/25/2024 16:03     Medical Consultants:   None.   Subjective:    Colin Ramirez no chest pain or shortness of breath  Objective:     Vitals:   01/27/24 0345 01/27/24 0400 01/27/24 0407 01/27/24 0500  BP: 133/76     Pulse: (!) 57 (!) 53 62 (!) 50  Resp: 15     Temp: (!) 97.5 F (36.4 C)     TempSrc: Oral     SpO2:  99% 97% 98%  Weight:   109.1 kg   Height:       SpO2: 98 %   Intake/Output Summary (Last 24 hours) at 01/27/2024 0738 Last data filed at 01/27/2024 0340 Gross per 24 hour  Intake 1000 ml  Output --  Net 1000 ml   Filed Weights   01/25/24 0429 01/26/24 0455 01/27/24 0407  Weight: 115 kg 108.7 kg 109.1 kg    Exam: General exam: In no acute distress. Respiratory system: Good air movement and clear to auscultation. Cardiovascular system: S1 & S2 heard, RRR. No JVD. Gastrointestinal system: Abdomen is nondistended, soft and nontender.  Extremities: No pedal edema. Skin: No rashes, lesions or ulcers Psychiatry: Judgement and insight appear normal. Mood & affect appropriate.  Data Reviewed:    Labs: Basic Metabolic Panel: Recent Labs  Lab 01/24/24 1557 01/24/24 1803 01/24/24 1956 01/25/24 0511 01/26/24 1014 01/26/24 1645 01/27/24 0234  NA 136  --   --  138  137 140  135 138  K 2.5*  --    < > 3.5 3.6 3.4*  2.2* 3.5  CL 97*  --   --  98 103  --  102  CO2 19*  --   --  28 25  --  27  GLUCOSE 202*  --   --  147* 143*  --  166*  BUN 19  --   --  16 13  --  15  CREATININE 1.32*  --   --  0.93 0.66  --  0.72  CALCIUM  8.7*  --   --  9.0 8.8*  --  8.6*  MG  --  2.1  --  2.0  --   --   --    < > = values in this interval not displayed.   GFR Estimated Creatinine Clearance: 127.2 mL/min (by C-G formula based on SCr of 0.72 mg/dL). Liver Function Tests: Recent Labs  Lab 01/25/24 0511  AST 19  ALT 20  ALKPHOS 44  BILITOT 0.7  PROT 5.9*  ALBUMIN 3.3*   Recent Labs  Lab 01/25/24 0511  LIPASE 37   No results for input(s): AMMONIA in the last 168 hours. Coagulation profile No results for input(s): INR, PROTIME in the last 168 hours. COVID-19 Labs  No results for  input(s): DDIMER, FERRITIN, LDH, CRP in the last 72 hours.  Lab Results  Component Value Date   SARSCOV2NAA RESULT: NEGATIVE 08/24/2019   SARSCOV2NAA Not Detected 05/11/2019   SARSCOV2NAA Not Detected 01/17/2019    CBC: Recent Labs  Lab 01/24/24 1557 01/25/24 0511 01/26/24 1645 01/27/24 0234  WBC 10.1 6.1  --  6.2  NEUTROABS  --  3.6  --   --   HGB 15.0 14.3 13.3  10.9* 12.9*  HCT 46.3 42.8 39.0  32.0* 39.6  MCV 90.1 89.4  --  89.8  PLT 300 225  --  219   Cardiac Enzymes: No results for input(s): CKTOTAL, CKMB, CKMBINDEX, TROPONINI in the last 168 hours. BNP (last 3 results) No results for input(s): PROBNP in the last 8760 hours. CBG: Recent Labs  Lab 01/26/24 1116 01/26/24 1517 01/26/24 1729 01/26/24 2108 01/27/24 0615  GLUCAP 144* 142* 87 241* 226*   D-Dimer: No results for input(s): DDIMER in the last 72 hours. Hgb A1c: Recent Labs    01/25/24 0511  HGBA1C 7.4*   Lipid Profile: Recent Labs    01/26/24 0227  CHOL 144  HDL 39*  LDLCALC 46  TRIG 703*  CHOLHDL 3.7   Thyroid  function studies: No results for input(s): TSH, T4TOTAL, T3FREE, THYROIDAB in the last 72 hours.  Invalid input(s): FREET3 Anemia work up: No results for input(s): VITAMINB12, FOLATE, FERRITIN, TIBC, IRON, RETICCTPCT in the last 72 hours. Sepsis Labs: Recent Labs  Lab 01/24/24 1557 01/25/24 0511 01/27/24 0234  WBC 10.1 6.1 6.2   Microbiology No results found for this or any previous visit (from the past 240 hours).   Medications:    amLODipine   2.5 mg Oral Daily   aspirin  EC  81 mg Oral Daily   atorvastatin   40 mg Oral Daily   insulin  aspart  0-5 Units Subcutaneous QHS   insulin  aspart  0-9 Units Subcutaneous TID WC   isosorbide  mononitrate  30 mg Oral Daily   levothyroxine   175 mcg Oral Daily   nitroGLYCERIN   0.4 mg Sublingual Once   pantoprazole   40 mg Oral Daily   ramipril   10 mg Oral Daily  sodium chloride  flush  3  mL Intravenous Q12H   Continuous Infusions:  sodium chloride         LOS: 0 days   Erle Odell Castor  [facility]  01/27/2024, 7:38 AM

## 2024-01-27 NOTE — Progress Notes (Signed)
 PRE CABG exams are completed. Colin Ramirez, RVT
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
## 2024-01-27 NOTE — Progress Notes (Signed)
 PHARMACY - ANTICOAGULATION CONSULT NOTE  Pharmacy Consult for heparin  Indication: CAD awaiting surgery  Labs: Recent Labs    01/25/24 0511 01/26/24 1014 01/26/24 1645 01/27/24 0234 01/27/24 1544 01/27/24 2300  HGB 14.3  --  13.3  10.9* 12.9*  --   --   HCT 42.8  --  39.0  32.0* 39.6  --   --   PLT 225  --   --  219  --   --   HEPARINUNFRC  --   --   --   --  0.14* 0.35  CREATININE 0.93 0.66  --  0.72  --   --   TROPONINIHS 10  --   --   --   --   --    Assessment/Plan:  58yo male therapeutic on heparin  after rate change. Will continue infusion at current rate of 1650 units/hr and confirm stable with am labs.  Marvetta Dauphin, PharmD, BCPS 01/27/2024 11:41 PM
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
## 2024-01-28 DIAGNOSIS — R0789 Other chest pain: Secondary | ICD-10-CM | POA: Diagnosis not present

## 2024-01-28 LAB — GLUCOSE, CAPILLARY
Glucose-Capillary: 169 mg/dL — ABNORMAL HIGH (ref 70–99)
Glucose-Capillary: 181 mg/dL — ABNORMAL HIGH (ref 70–99)
Glucose-Capillary: 176 mg/dL — ABNORMAL HIGH (ref 70–99)
Glucose-Capillary: 190 mg/dL — ABNORMAL HIGH (ref 70–99)

## 2024-01-28 LAB — BASIC METABOLIC PANEL WITH GFR
Anion gap: 8 (ref 5–15)
BUN: 16 mg/dL (ref 6–20)
CO2: 25 mmol/L (ref 22–32)
Calcium: 8.5 mg/dL — ABNORMAL LOW (ref 8.9–10.3)
Chloride: 107 mmol/L (ref 98–111)
Creatinine, Ser: 0.68 mg/dL (ref 0.61–1.24)
GFR, Estimated: >60 mL/min (ref >60)
Glucose, Bld: 202 mg/dL — ABNORMAL HIGH (ref 70–99)
Potassium: 3.7 mmol/L (ref 3.5–5.1)
Sodium: 140 mmol/L (ref 135–145)

## 2024-01-28 LAB — CBC
HCT: 39.2 % (ref 39.0–52.0)
Hemoglobin: 12.8 g/dL — ABNORMAL LOW (ref 13.0–17.0)
MCH: 29.6 pg (ref 26.0–34.0)
MCHC: 32.7 g/dL (ref 30.0–36.0)
MCV: 90.5 fL (ref 80.0–100.0)
Platelets: 196 K/uL (ref 150–400)
RBC: 4.33 MIL/uL (ref 4.22–5.81)
RDW: 15.8 % — ABNORMAL HIGH (ref 11.5–15.5)
WBC: 5.8 K/uL (ref 4.0–10.5)
nRBC: 0 % (ref 0.0–0.2)

## 2024-01-28 LAB — HEPARIN LEVEL (UNFRACTIONATED): Heparin Unfractionated: 0.4 [IU]/mL (ref 0.30–0.70)

## 2024-01-28 MED ORDER — ERYTHROMYCIN 5 MG/GM OP OINT
TOPICAL_OINTMENT | Freq: Three times a day (TID) | OPHTHALMIC | Status: DC
  Filled 2024-01-28: qty 1

## 2024-01-28 MED ORDER — ERYTHROMYCIN 5 MG/GM OP OINT
1.0000 | TOPICAL_OINTMENT | Freq: Four times a day (QID) | OPHTHALMIC | Status: AC
  Administered 2024-01-28 – 2024-02-02 (×18): 1 via OPHTHALMIC
  Filled 2024-01-28 (×2): qty 1

## 2024-01-28 MED ORDER — ARTIFICIAL TEARS OPHTHALMIC OINT
TOPICAL_OINTMENT | Freq: Three times a day (TID) | OPHTHALMIC | Status: DC
  Administered 2024-01-29 – 2024-02-09 (×5): 1 via OPHTHALMIC
  Filled 2024-01-28 (×2): qty 3.5

## 2024-01-28 MED ORDER — INSULIN GLARGINE-YFGN 100 UNIT/ML ~~LOC~~ SOLN
5.0000 [IU] | Freq: Two times a day (BID) | SUBCUTANEOUS | Status: DC
  Administered 2024-01-28 (×2): 5 [IU] via SUBCUTANEOUS
  Filled 2024-01-28 (×4): qty 0.05
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
## 2024-01-28 NOTE — Progress Notes (Addendum)
 1400: Patient reports 5/10 chest pain. BP 121/74, HR 53-57. Offered nitroglycerin  tablet (available per [facility]) after education regarding medication effects. Patient declined at this time, stating it had backed off a bit, but will reassess need for nitroglycerin  tablet should chest pain persist/increase.

## 2024-01-28 NOTE — Progress Notes (Signed)
  Progress Note  Patient Name: Colin Ramirez Date of Encounter: 01/28/2024 [facility] Cardiologist: MARALEE ELSPETH BROCKS, MD   Interval Summary   No acute overnight events. 1 episode of mild CP around midnight that resolved within minutes. Patient reports feeling relatively well. No new or acute complaints.   Vital Signs Vitals:   01/27/24 2326 01/28/24 0505 01/28/24 0834 01/28/24 1116  BP: 134/89 131/73 120/71 (!) 154/91  Pulse: 60 (!) 54 (!) 54 (!) 50  Resp: 20 15    Temp: 97.8 F (36.6 C) 97.7 F (36.5 C) (!) 97.5 F (36.4 C) 97.6 F (36.4 C)  TempSrc: Oral Oral Oral Oral  SpO2: 95% 95% 100%   Weight:  111.1 kg    Height:        Intake/Output Summary (Last 24 hours) at 01/28/2024 1243 Last data filed at 01/28/2024 0008 Gross per 24 hour  Intake 262.93 ml  Output 1000 ml  Net -737.07 ml      01/28/2024    5:05 AM 01/27/2024    4:07 AM 01/26/2024    4:55 AM  Last 3 Weights  Weight (lbs) 244 lb 14.9 oz 240 lb 8.4 oz 239 lb 9.6 oz  Weight (kg) 111.1 kg 109.1 kg 108.682 kg      Telemetry/ECG  SR / SB with Mobitz I - Personally Reviewed  Physical Exam  General: Well developed, in no acute distress.  Neck: No JVD.  Cardiac: Normal rate, regular rhythm.  Resp: Normal work of breathing.  Ext: No edema.  Neuro: No gross focal deficits.  Psych: Normal affect.   Assessment & Plan  58 y.o. male with known CAD, DM, HLD, HTN and ongoing tobacco abuse is seen for evaluation of chest pain. Found to have multivessel CAD and bicuspid AV w/ mild-mod stenosis and ascending thoracic aortic aneurysm.   #. Unstable angina: Cardiac cath shows complex stenosis in the proximal to mid LAD spanning 2 large diagonal branches. Also disease in small OM and distal RCA -Recommend CABG/AVR/Bentall procedure next week. CT surgery consulted.  -Recommend nitroglycerin  infusion if he has recurrent CP while awaiting surgery.    #. Ascending thoracic aneurysm 4.9 cm.  #. Mild to moderate  Aortic stenosis -Recommend CABG/AVR/Bentall procedure next week. CT surgery consulted.   #. Tobacco abuse.  -Counseled on smoking cessation.  #. DM A1c 7.4%. - Per medical team  #. HLD  - High dose statin. LDL at goal 46.   # HTN controlled -Per medical team  For questions or updates, please contact [facility] Please consult [email or web address] for contact info under       Signed, [name], MD

## 2024-01-28 NOTE — Progress Notes (Signed)
 [facility] PROGRESS NOTE    Progress Note  Colin Ramirez  FMW:[id number] DOB: 1966-03-02 DOA: 01/24/2024 PCP: Colin Dorothyann Maxwell, MD (Inactive)     Brief Narrative:   Colin Ramirez is an 58 y.o. male past medical history significant for coronary artery disease, with a left heart cath on 10/28/2017 that showed two-vessel disease with moderate stenosis, diabetes mellitus type 2, essential hypertension admitted to [facility] when he started having chest pain intermittent nonradiating that started about 6 days prior to admission not associated with shortness of breath or palpitation he does relate recent nausea and vomiting.  Cardiac biomarkers are basically remained negative, twelve-lead EKG showed normal sinus rhythm normal axis no T wave abnormalities. Left heart cath on May 2019 that showed moderate two-vessel disease.  Assessment/Plan:   Unstable angina: Continue aspirin , beta-blockers and statins.  Nitroglycerin  as needed for chest pain Left heart cath showed multivessel disease. CT angio that showed ascending aortic aneurysm measuring 5 cm no dissection. Dentist was consulted recommended multiple tooth removal in the OR early next week. CT surgery was consulted recommended CABG/AVR and Bentall procedure next week timing of the surgery to be determined. Further management per CV surgery.  Hypokalemia: Repleted now resolved.  AKI: Likely hemodynamic mediated. Improve with IV fluids.  Uncontrolled Diabetes mellitus type 2: Hemoglobin A1c was 7.4 Blood glucose slightly elevated increase long-acting insulin  to twice a day continue sliding scale insulin . Holding all oral hypoglycemic agents.  Pinkeye unilaterally: Started on eyedrops, and erythromycin  ointment. He describes like there is sand in his eye No headache nausea or visual changes.  Essential hypertension: HCTZ was held on admission. Continue Coreg .  Hyperlipidemia: Continue  statins.  GERD: Continue PPI  Morbid obesity with a BMI greater than 30: Noted.    DVT prophylaxis: lovenox  Family Communication:noen Status is: Observation The patient remains OBS appropriate and will d/c before 2 midnights.    Code Status:     Code Status Orders  (From admission, onward)           Start     Ordered   01/24/24 2017  Full code  Continuous       Question:  By:  Answer:  Consent: discussion documented in EHR   01/24/24 2017           Code Status History     Date Active Date Inactive Code Status Order ID Comments User Context   06/07/2018 2112 06/08/2018 2158 Full Code [id number]  Lonzell Emeline HERO, DO ED   10/28/2017 1648 10/29/2017 1450 Full Code [id number]  Maxie Herlene POUR, PA-C ED   08/20/2016 1306 08/21/2016 1715 Full Code [id number]  Vernetta Lonni GRADE, MD Inpatient         IV Access:   Peripheral IV   Procedures and diagnostic studies:   VAS US  DOPPLER PRE CABG Result Date: 01/27/2024 PREOPERATIVE VASCULAR EVALUATION Patient Name:  Colin Ramirez  Date of Exam:   01/27/2024 Medical Rec #: [id number]          Accession #:    [id number] Date of Birth: Aug 20, 1965          Patient Gender: M Patient Age:   79 years Exam Location:  [facility] Procedure:      VAS US  DOPPLER PRE CABG Referring Phys: WAYNE GOLD --------------------------------------------------------------------------------  Indications:  Pre-CABG. Risk Factors: Hyperlipidemia, Diabetes, current smoker, coronary artery disease. Performing Technologist: Elmarie Lindau, RVT  Examination Guidelines: A complete evaluation includes B-mode imaging, spectral Doppler, color  Doppler, and power Doppler as needed of all accessible portions of each vessel. Bilateral testing is considered an integral part of a complete examination. Limited examinations for reoccurring indications may be performed as noted.  Right Carotid Findings:  +----------+--------+--------+--------+--------------------------+--------+           PSV cm/sEDV cm/sStenosisDescribe                  Comments +----------+--------+--------+--------+--------------------------+--------+ CCA Prox  63      15                                                 +----------+--------+--------+--------+--------------------------+--------+ CCA Distal69      20                                                 +----------+--------+--------+--------+--------------------------+--------+ ICA Prox  48      10      1-39%   irregular and heterogenous         +----------+--------+--------+--------+--------------------------+--------+ ICA Mid   52      20                                                 +----------+--------+--------+--------+--------------------------+--------+ ICA Distal50      18                                                 +----------+--------+--------+--------+--------------------------+--------+ ECA       90                                                         +----------+--------+--------+--------+--------------------------+--------+ +----------+--------+-------+----------------+------------+           PSV cm/sEDV cmsDescribe        Arm Pressure +----------+--------+-------+----------------+------------+ Subclavian59             Multiphasic, [id number]          +----------+--------+-------+----------------+------------+ +---------+--------+--+--------+--+---------+ VertebralPSV cm/s37EDV cm/s12Antegrade +---------+--------+--+--------+--+---------+ Left Carotid Findings: +----------+--------+--------+--------+--------------------------+--------+           PSV cm/sEDV cm/sStenosisDescribe                  Comments +----------+--------+--------+--------+--------------------------+--------+ CCA Prox  59      15                                                  +----------+--------+--------+--------+--------------------------+--------+ CCA Distal80      20              irregular and heterogenous         +----------+--------+--------+--------+--------------------------+--------+ ICA Prox  89      23      1-39%  irregular and heterogenous         +----------+--------+--------+--------+--------------------------+--------+ ICA Mid   61      19                                                 +----------+--------+--------+--------+--------------------------+--------+ ICA Distal43      21                                                 +----------+--------+--------+--------+--------------------------+--------+ ECA       78                                                         +----------+--------+--------+--------+--------------------------+--------+  +----------+--------+--------+----------------+------------+ SubclavianPSV cm/sEDV cm/sDescribe        Arm Pressure +----------+--------+--------+----------------+------------+           84              Multiphasic, WNL             +----------+--------+--------+----------------+------------+ +---------+--------+--+--------+--+---------+ VertebralPSV cm/s39EDV cm/s13Antegrade +---------+--------+--+--------+--+---------+  ABI Findings: +------------------+-----+---------+ Rt Pressure (mmHg)IndexWaveform  +------------------+-----+---------+ 117                              +------------------+-----+---------+ 146               1.25 triphasic +------------------+-----+---------+ 136               1.16 triphasic +------------------+-----+---------+ +------------------+-----+---------+-------+ Lt Pressure (mmHg)IndexWaveform Comment +------------------+-----+---------+-------+                                 IV      +------------------+-----+---------+-------+ 153               1.31 triphasic        +------------------+-----+---------+-------+ 131                1.12 triphasic        +------------------+-----+---------+-------+  Right Doppler Findings: +--------+--------+ Site    Pressure +--------+--------+ [id number]      +--------+--------+  Left Doppler Findings: +--------+--------+ Site    Comments +--------+--------+ BrachialIV       +--------+--------+   Summary: Right Carotid: Velocities in the right ICA are consistent with a 1-39% stenosis. Left Carotid: Velocities in the left ICA are consistent with a 1-39% stenosis. Vertebrals:  Bilateral vertebral arteries demonstrate antegrade flow. Subclavians: Normal flow hemodynamics were seen in bilateral subclavian              arteries. Right ABI: Resting right ankle-brachial index is within normal range. Left ABI: Resting left ankle-brachial index is within normal range. Right Upper Extremity: Doppler waveforms remain within normal limits with right radial compression. Doppler waveforms remain within normal limits with right ulnar compression. Left Upper Extremity: Doppler waveforms remain within normal limits with left radial compression. Doppler waveforms remain within normal limits with left ulnar compression.  Electronically signed by Debby Robertson on 01/27/2024 at 4:52:44 PM.  Final    DG Orthopantogram Result Date: 01/27/2024 CLINICAL DATA:  Pre screening for cardiac surgery EXAM: ORTHOPANTOGRAM/PANORAMIC COMPARISON:  None Available. FINDINGS: Multiple missing mandibular teeth. Dental amalgam is seen in maxillary and mandibular molars. Fractured tooth ADA #19. No abnormal periapical lucency. IMPRESSION: 1. Fractured tooth ADA #19. 2. Multiple missing mandibular teeth. Electronically Signed   By: Limin  Xu M.D.   On: 01/27/2024 15:26   CARDIAC CATHETERIZATION Result Date: 01/26/2024   Prox LAD to Mid LAD lesion is 75% stenosed.   Ost 1st Diag to 1st Diag lesion is 30% stenosed.   Ost 2nd Diag to 2nd Diag lesion is 30% stenosed.   Mid RCA to Dist RCA lesion is 50% stenosed.   Prox  RCA lesion is 40% stenosed.   RPDA lesion is 90% stenosed.   RPAV lesion is 70% stenosed.   Dist RCA lesion is 50% stenosed.   Prox Cx lesion is 90% stenosed.   Ost 3rd Mrg lesion is 90% stenosed.   Prox LAD lesion is 70% stenosed. Severe mid LAD stenosis Severe proximal Circumflex stenosis and severe stenosis involving the most distal obtuse marginal branch. Large dominant RCA with moderate mid and distal stenosis. Severe stenosis in the moderate caliber posterolateral artery and severe stenosis in the moderate caliber PDA. Mild elevation of right and left heart pressures Mild to moderate aortic stenosis (14 mm Hg peak to peak gradient, 16 mmHg mean gradient). Recommendations: Will review cath images with the IC team tomorrow. Given multi-vessel CAD, dilated aortic root and moderate aortic stenosis, will need to consider CT surgery consult for Bentall procedure + CABG. We will call the CT surgery team tomorrow given the late timing of the case being finished tonight.     Medical Consultants:   None.   Subjective:    Colin Ramirez asymptomatic no chest pain or shortness of breath  Objective:    Vitals:   01/27/24 2230 01/27/24 2300 01/27/24 2326 01/28/24 0505  BP:   134/89 131/73  Pulse:   60 (!) 54  Resp: 15 11 20 15   Temp:   97.8 F (36.6 C) 97.7 F (36.5 C)  TempSrc:   Oral Oral  SpO2:   95% 95%  Weight:    111.1 kg  Height:       SpO2: 95 %   Intake/Output Summary (Last 24 hours) at 01/28/2024 0724 Last data filed at 01/28/2024 0008 Gross per 24 hour  Intake 382.93 ml  Output 1000 ml  Net -617.07 ml   Filed Weights   01/26/24 0455 01/27/24 0407 01/28/24 0505  Weight: 108.7 kg 109.1 kg 111.1 kg    Exam: General exam: In no acute distress. Respiratory system: Good air movement and clear to auscultation. Cardiovascular system: S1 & S2 heard, RRR. No JVD. Gastrointestinal system: Abdomen is nondistended, soft and nontender.  Extremities: No pedal edema. Skin: No  rashes, lesions or ulcers Psychiatry: Judgement and insight appear normal. Mood & affect appropriate.  Data Reviewed:    Labs: Basic Metabolic Panel: Recent Labs  Lab 01/24/24 1557 01/24/24 1803 01/24/24 1956 01/25/24 0511 01/26/24 1014 01/26/24 1645 01/27/24 0234 01/28/24 0302  NA 136  --   --  138 137 140  135 138 140  K 2.5*  --    < > 3.5 3.6 3.4*  2.2* 3.5 3.7  CL 97*  --   --  98 103  --  102 107  CO2 19*  --   --  28 25  --  27 25  GLUCOSE 202*  --   --  147* 143*  --  166* 202*  BUN 19  --   --  16 13  --  15 16  CREATININE 1.32*  --   --  0.93 0.66  --  0.72 0.68  CALCIUM  8.7*  --   --  9.0 8.8*  --  8.6* 8.5*  MG  --  2.1  --  2.0  --   --   --   --    < > = values in this interval not displayed.   GFR Estimated Creatinine Clearance: 128.3 mL/min (by C-G formula based on SCr of 0.68 mg/dL). Liver Function Tests: Recent Labs  Lab 01/25/24 0511  AST 19  ALT 20  ALKPHOS 44  BILITOT 0.7  PROT 5.9*  ALBUMIN 3.3*   Recent Labs  Lab 01/25/24 0511  LIPASE 37   No results for input(s): AMMONIA in the last 168 hours. Coagulation profile No results for input(s): INR, PROTIME in the last 168 hours. COVID-19 Labs  No results for input(s): DDIMER, FERRITIN, LDH, CRP in the last 72 hours.  Lab Results  Component Value Date   SARSCOV2NAA RESULT: NEGATIVE 08/24/2019   SARSCOV2NAA Not Detected 05/11/2019   SARSCOV2NAA Not Detected 01/17/2019    CBC: Recent Labs  Lab 01/24/24 1557 01/25/24 0511 01/26/24 1645 01/27/24 0234 01/28/24 0302  WBC 10.1 6.1  --  6.2 5.8  NEUTROABS  --  3.6  --   --   --   HGB 15.0 14.3 13.3  10.9* 12.9* 12.8*  HCT 46.3 42.8 39.0  32.0* 39.6 39.2  MCV 90.1 89.4  --  89.8 90.5  PLT 300 225  --  219 196   Cardiac Enzymes: No results for input(s): CKTOTAL, CKMB, CKMBINDEX, TROPONINI in the last 168 hours. BNP (last 3 results) No results for input(s): PROBNP in the last 8760 hours. CBG: Recent  Labs  Lab 01/27/24 0615 01/27/24 1110 01/27/24 1600 01/27/24 2056 01/28/24 0603  GLUCAP 226* 222* 232* 222* 169*   D-Dimer: No results for input(s): DDIMER in the last 72 hours. Hgb A1c: No results for input(s): HGBA1C in the last 72 hours.  Lipid Profile: Recent Labs    01/26/24 0227  CHOL 144  HDL 39*  LDLCALC 46  TRIG 703*  CHOLHDL 3.7   Thyroid  function studies: No results for input(s): TSH, T4TOTAL, T3FREE, THYROIDAB in the last 72 hours.  Invalid input(s): FREET3 Anemia work up: No results for input(s): VITAMINB12, FOLATE, FERRITIN, TIBC, IRON, RETICCTPCT in the last 72 hours. Sepsis Labs: Recent Labs  Lab 01/24/24 1557 01/25/24 0511 01/27/24 0234 01/28/24 0302  WBC 10.1 6.1 6.2 5.8   Microbiology No results found for this or any previous visit (from the past 240 hours).   Medications:    amLODipine   2.5 mg Oral Daily   aspirin  EC  81 mg Oral Daily   atorvastatin   40 mg Oral Daily   insulin  aspart  0-5 Units Subcutaneous QHS   insulin  aspart  0-9 Units Subcutaneous TID WC   insulin  aspart  3 Units Subcutaneous TID WC   insulin  glargine-yfgn  5 Units Subcutaneous Daily   isosorbide  mononitrate  30 mg Oral Daily   levothyroxine   175 mcg Oral Daily   nitroGLYCERIN   0.4 mg Sublingual Once   pantoprazole   40 mg Oral Daily   ramipril   10 mg Oral Daily   sodium chloride  flush  3 mL Intravenous Q12H   Continuous  Infusions:  heparin  1,650 Units/hr (01/28/24 0028)      LOS: 1 day   Erle Odell Castor  [facility]  01/28/2024, 7:24 AM

## 2024-01-28 NOTE — Plan of Care (Signed)
  Problem: Education: Goal: Knowledge of General Education information will improve Description: Including pain rating scale, medication(s)/side effects and non-pharmacologic comfort measures Outcome: Progressing   Problem: Health Behavior/Discharge Planning: Goal: Ability to manage health-related needs will improve Outcome: Progressing   Problem: Clinical Measurements: Goal: Will remain free from infection Outcome: Progressing Goal: Diagnostic test results will improve Outcome: Progressing Goal: Respiratory complications will improve Outcome: Progressing   Problem: Activity: Goal: Risk for activity intolerance will decrease Outcome: Progressing   Problem: Nutrition: Goal: Adequate nutrition will be maintained Outcome: Progressing

## 2024-01-29 DIAGNOSIS — R0789 Other chest pain: Secondary | ICD-10-CM | POA: Diagnosis not present

## 2024-01-29 LAB — HEPARIN LEVEL (UNFRACTIONATED): Heparin Unfractionated: 0.36 [IU]/mL (ref 0.30–0.70)

## 2024-01-29 LAB — GLUCOSE, CAPILLARY
Glucose-Capillary: 216 mg/dL — ABNORMAL HIGH (ref 70–99)
Glucose-Capillary: 181 mg/dL — ABNORMAL HIGH (ref 70–99)
Glucose-Capillary: 159 mg/dL — ABNORMAL HIGH (ref 70–99)
Glucose-Capillary: 197 mg/dL — ABNORMAL HIGH (ref 70–99)

## 2024-01-29 LAB — CBC
HCT: 39.8 % (ref 39.0–52.0)
Hemoglobin: 12.9 g/dL — ABNORMAL LOW (ref 13.0–17.0)
MCH: 29.6 pg (ref 26.0–34.0)
MCHC: 32.4 g/dL (ref 30.0–36.0)
MCV: 91.3 fL (ref 80.0–100.0)
Platelets: 215 K/uL (ref 150–400)
RBC: 4.36 MIL/uL (ref 4.22–5.81)
RDW: 15.9 % — ABNORMAL HIGH (ref 11.5–15.5)
WBC: 6 K/uL (ref 4.0–10.5)
nRBC: 0 % (ref 0.0–0.2)

## 2024-01-29 MED ORDER — INSULIN GLARGINE-YFGN 100 UNIT/ML ~~LOC~~ SOLN
8.0000 [IU] | Freq: Two times a day (BID) | SUBCUTANEOUS | Status: DC
  Administered 2024-01-29 (×2): 8 [IU] via SUBCUTANEOUS
  Filled 2024-01-29 (×4): qty 0.08

## 2024-01-29 NOTE — Plan of Care (Signed)
   Problem: Education: Goal: Knowledge of General Education information will improve Description Including pain rating scale, medication(s)/side effects and non-pharmacologic comfort measures Outcome: Progressing   Problem: Clinical Measurements: Goal: Will remain free from infection Outcome: Progressing   Problem: Nutrition: Goal: Adequate nutrition will be maintained Outcome: Progressing   Problem: Coping: Goal: Level of anxiety will decrease Outcome: Progressing

## 2024-01-29 NOTE — Progress Notes (Signed)
 [facility] PROGRESS NOTE    Progress Note  Colin Ramirez  FMW:[id number] DOB: Oct 31, 1965 DOA: 01/24/2024 PCP: [name], MD (Inactive)     Brief Narrative:   Colin Ramirez is an 58 y.o. male past medical history significant for coronary artery disease, with a left heart cath on 10/28/2017 that showed two-vessel disease with moderate stenosis, diabetes mellitus type 2, essential hypertension admitted to [facility] when he started having chest pain intermittent nonradiating that started about 6 days prior to admission not associated with shortness of breath or palpitation he does relate recent nausea and vomiting.  Cardiac biomarkers are basically remained negative, twelve-lead EKG showed normal sinus rhythm normal axis no T wave abnormalities. Left heart cath on May 2019 that showed moderate two-vessel disease.  Assessment/Plan:   Unstable angina: Continue aspirin , beta-blockers and statins.   Started having chest pain started on IV heparin  Left heart cath showed multivessel disease. CT angio that showed ascending aortic aneurysm measuring 5 cm no dissection. Dentist was consulted recommended multiple tooth removal in the OR early next week. CT surgery was consulted recommended CABG/AVR and Bentall procedure next week timing of the surgery to be determined. Further management per CV surgery.  Hypokalemia: Repleted now resolved.  AKI: Likely hemodynamic mediated. Improve with IV fluids.  Uncontrolled Diabetes mellitus type 2: Hemoglobin A1c was 7.4 Blood glucose slightly elevated increase long-acting insulin  to twice a day continue sliding scale insulin . Holding all oral hypoglycemic agents.  Pinkeye unilaterally: Started on eyedrops, and erythromycin  ointment. Relates his eye looks less red and feels better  Essential hypertension: HCTZ was held on admission. Continue Coreg .  Hyperlipidemia: Continue statins.  GERD: Continue  PPI  Morbid obesity with a BMI greater than 30: Noted.    DVT prophylaxis: lovenox  Family Communication:noen Status is: Observation The patient remains OBS appropriate and will d/c before 2 midnights.    Code Status:     Code Status Orders  (From admission, onward)           Start     Ordered   01/24/24 2017  Full code  Continuous       Question:  By:  Answer:  Consent: discussion documented in EHR   01/24/24 2017           Code Status History     Date Active Date Inactive Code Status Order ID Comments User Context   06/07/2018 2112 06/08/2018 2158 Full Code [id number]  Lonzell Emeline HERO, DO ED   10/28/2017 1648 10/29/2017 1450 Full Code [id number]  Maxie Herlene POUR, PA-C ED   08/20/2016 1306 08/21/2016 1715 Full Code [id number]  Vernetta Lonni GRADE, MD Inpatient         IV Access:   Peripheral IV   Procedures and diagnostic studies:   VAS US  DOPPLER PRE CABG Result Date: 01/27/2024 PREOPERATIVE VASCULAR EVALUATION Patient Name:  Colin Ramirez  Date of Exam:   01/27/2024 Medical Rec #: [id number]          Accession #:    [id number] Date of Birth: 1965-10-10          Patient Gender: M Patient Age:   4 years Exam Location:  [facility] Procedure:      VAS US  DOPPLER PRE CABG Referring Phys: WAYNE GOLD --------------------------------------------------------------------------------  Indications:  Pre-CABG. Risk Factors: Hyperlipidemia, Diabetes, current smoker, coronary artery disease. Performing Technologist: Elmarie Lindau, RVT  Examination Guidelines: A complete evaluation includes B-mode imaging, spectral Doppler, color Doppler, and power  Doppler as needed of all accessible portions of each vessel. Bilateral testing is considered an integral part of a complete examination. Limited examinations for reoccurring indications may be performed as noted.  Right Carotid Findings: +----------+--------+--------+--------+--------------------------+--------+           PSV  cm/sEDV cm/sStenosisDescribe                  Comments +----------+--------+--------+--------+--------------------------+--------+ CCA Prox  63      15                                                 +----------+--------+--------+--------+--------------------------+--------+ CCA Distal69      20                                                 +----------+--------+--------+--------+--------------------------+--------+ ICA Prox  48      10      1-39%   irregular and heterogenous         +----------+--------+--------+--------+--------------------------+--------+ ICA Mid   52      20                                                 +----------+--------+--------+--------+--------------------------+--------+ ICA Distal50      18                                                 +----------+--------+--------+--------+--------------------------+--------+ ECA       90                                                         +----------+--------+--------+--------+--------------------------+--------+ +----------+--------+-------+----------------+------------+           PSV cm/sEDV cmsDescribe        Arm Pressure +----------+--------+-------+----------------+------------+ Subclavian59             Multiphasic, [id number]          +----------+--------+-------+----------------+------------+ +---------+--------+--+--------+--+---------+ VertebralPSV cm/s37EDV cm/s12Antegrade +---------+--------+--+--------+--+---------+ Left Carotid Findings: +----------+--------+--------+--------+--------------------------+--------+           PSV cm/sEDV cm/sStenosisDescribe                  Comments +----------+--------+--------+--------+--------------------------+--------+ CCA Prox  59      15                                                 +----------+--------+--------+--------+--------------------------+--------+ CCA Distal80      20              irregular and  heterogenous         +----------+--------+--------+--------+--------------------------+--------+ ICA Prox  89      23      1-39%   irregular and  heterogenous         +----------+--------+--------+--------+--------------------------+--------+ ICA Mid   61      19                                                 +----------+--------+--------+--------+--------------------------+--------+ ICA Distal43      21                                                 +----------+--------+--------+--------+--------------------------+--------+ ECA       78                                                         +----------+--------+--------+--------+--------------------------+--------+  +----------+--------+--------+----------------+------------+ SubclavianPSV cm/sEDV cm/sDescribe        Arm Pressure +----------+--------+--------+----------------+------------+           84              Multiphasic, WNL             +----------+--------+--------+----------------+------------+ +---------+--------+--+--------+--+---------+ VertebralPSV cm/s39EDV cm/s13Antegrade +---------+--------+--+--------+--+---------+  ABI Findings: +------------------+-----+---------+ Rt Pressure (mmHg)IndexWaveform  +------------------+-----+---------+ 117                              +------------------+-----+---------+ 146               1.25 triphasic +------------------+-----+---------+ 136               1.16 triphasic +------------------+-----+---------+ +------------------+-----+---------+-------+ Lt Pressure (mmHg)IndexWaveform Comment +------------------+-----+---------+-------+                                 IV      +------------------+-----+---------+-------+ 153               1.31 triphasic        +------------------+-----+---------+-------+ 131               1.12 triphasic        +------------------+-----+---------+-------+  Right Doppler Findings: +--------+--------+  Site    Pressure +--------+--------+ [id number]      +--------+--------+  Left Doppler Findings: +--------+--------+ Site    Comments +--------+--------+ BrachialIV       +--------+--------+   Summary: Right Carotid: Velocities in the right ICA are consistent with a 1-39% stenosis. Left Carotid: Velocities in the left ICA are consistent with a 1-39% stenosis. Vertebrals:  Bilateral vertebral arteries demonstrate antegrade flow. Subclavians: Normal flow hemodynamics were seen in bilateral subclavian              arteries. Right ABI: Resting right ankle-brachial index is within normal range. Left ABI: Resting left ankle-brachial index is within normal range. Right Upper Extremity: Doppler waveforms remain within normal limits with right radial compression. Doppler waveforms remain within normal limits with right ulnar compression. Left Upper Extremity: Doppler waveforms remain within normal limits with left radial compression. Doppler waveforms remain within normal limits with left ulnar compression.  Electronically signed by Debby Robertson on 01/27/2024 at 4:52:44 PM.  Final    DG Orthopantogram Result Date: 01/27/2024 CLINICAL DATA:  Pre screening for cardiac surgery EXAM: ORTHOPANTOGRAM/PANORAMIC COMPARISON:  None Available. FINDINGS: Multiple missing mandibular teeth. Dental amalgam is seen in maxillary and mandibular molars. Fractured tooth ADA #19. No abnormal periapical lucency. IMPRESSION: 1. Fractured tooth ADA #19. 2. Multiple missing mandibular teeth. Electronically Signed   By: Limin  Xu M.D.   On: 01/27/2024 15:26     Medical Consultants:   None.   Subjective:    Colin Ramirez the any chest pain or shortness of breath, some bilateral hip pain  Objective:    Vitals:   01/28/24 1626 01/28/24 2102 01/28/24 2305 01/29/24 0415  BP: 121/74 103/61 123/70 (!) 144/73  Pulse: (!) 52 (!) 49 (!) 52 (!) 53  Resp:  15 17 20   Temp: 97.7 F (36.5 C) (!) 97.5 F (36.4 C) 97.9 F  (36.6 C) (!) 97.5 F (36.4 C)  TempSrc: Oral Oral Oral Oral  SpO2: 98% 95% 96%   Weight:    112.8 kg  Height:       SpO2: 96 %  No intake or output data in the 24 hours ending 01/29/24 0629  Filed Weights   01/27/24 0407 01/28/24 0505 01/29/24 0415  Weight: 109.1 kg 111.1 kg 112.8 kg    Exam: General exam: In no acute distress. Respiratory system: Good air movement and clear to auscultation. Cardiovascular system: S1 & S2 heard, RRR. No JVD. Gastrointestinal system: Abdomen is nondistended, soft and nontender.  Extremities: No pedal edema. Skin: No rashes, lesions or ulcers Psychiatry: Judgement and insight appear normal. Mood & affect appropriate.  Data Reviewed:    Labs: Basic Metabolic Panel: Recent Labs  Lab 01/24/24 1557 01/24/24 1803 01/24/24 1956 01/25/24 0511 01/26/24 1014 01/26/24 1645 01/27/24 0234 01/28/24 0302  NA 136  --   --  138 137 140  135 138 140  K 2.5*  --    < > 3.5 3.6 3.4*  2.2* 3.5 3.7  CL 97*  --   --  98 103  --  102 107  CO2 19*  --   --  28 25  --  27 25  GLUCOSE 202*  --   --  147* 143*  --  166* 202*  BUN 19  --   --  16 13  --  15 16  CREATININE 1.32*  --   --  0.93 0.66  --  0.72 0.68  CALCIUM  8.7*  --   --  9.0 8.8*  --  8.6* 8.5*  MG  --  2.1  --  2.0  --   --   --   --    < > = values in this interval not displayed.   GFR Estimated Creatinine Clearance: 129.3 mL/min (by C-G formula based on SCr of 0.68 mg/dL). Liver Function Tests: Recent Labs  Lab 01/25/24 0511  AST 19  ALT 20  ALKPHOS 44  BILITOT 0.7  PROT 5.9*  ALBUMIN 3.3*   Recent Labs  Lab 01/25/24 0511  LIPASE 37   No results for input(s): AMMONIA in the last 168 hours. Coagulation profile No results for input(s): INR, PROTIME in the last 168 hours. COVID-19 Labs  No results for input(s): DDIMER, FERRITIN, LDH, CRP in the last 72 hours.  Lab Results  Component Value Date   SARSCOV2NAA RESULT: NEGATIVE 08/24/2019   SARSCOV2NAA Not  Detected 05/11/2019   SARSCOV2NAA Not Detected 01/17/2019    CBC: Recent Labs  Lab 01/24/24 1557 01/25/24 0511 01/26/24 1645 01/27/24 0234 01/28/24 0302 01/29/24 0244  WBC 10.1 6.1  --  6.2 5.8 6.0  NEUTROABS  --  3.6  --   --   --   --   HGB 15.0 14.3 13.3  10.9* 12.9* 12.8* 12.9*  HCT 46.3 42.8 39.0  32.0* 39.6 39.2 39.8  MCV 90.1 89.4  --  89.8 90.5 91.3  PLT 300 225  --  219 196 215   Cardiac Enzymes: No results for input(s): CKTOTAL, CKMB, CKMBINDEX, TROPONINI in the last 168 hours. BNP (last 3 results) No results for input(s): PROBNP in the last 8760 hours. CBG: Recent Labs  Lab 01/27/24 2056 01/28/24 0603 01/28/24 1133 01/28/24 1625 01/28/24 2104  GLUCAP 222* 169* 181* 176* 190*   D-Dimer: No results for input(s): DDIMER in the last 72 hours. Hgb A1c: No results for input(s): HGBA1C in the last 72 hours.  Lipid Profile: No results for input(s): CHOL, HDL, LDLCALC, TRIG, CHOLHDL, LDLDIRECT in the last 72 hours.  Thyroid  function studies: No results for input(s): TSH, T4TOTAL, T3FREE, THYROIDAB in the last 72 hours.  Invalid input(s): FREET3 Anemia work up: No results for input(s): VITAMINB12, FOLATE, FERRITIN, TIBC, IRON, RETICCTPCT in the last 72 hours. Sepsis Labs: Recent Labs  Lab 01/25/24 0511 01/27/24 0234 01/28/24 0302 01/29/24 0244  WBC 6.1 6.2 5.8 6.0   Microbiology No results found for this or any previous visit (from the past 240 hours).   Medications:    amLODipine   2.5 mg Oral Daily   artificial tears   Both Eyes Q8H   aspirin  EC  81 mg Oral Daily   atorvastatin   40 mg Oral Daily   erythromycin   1 Application Both Eyes Q6H   insulin  aspart  0-5 Units Subcutaneous QHS   insulin  aspart  0-9 Units Subcutaneous TID WC   insulin  aspart  3 Units Subcutaneous TID WC   insulin  glargine-yfgn  5 Units Subcutaneous BID   isosorbide  mononitrate  30 mg Oral Daily   levothyroxine   175 mcg  Oral Daily   pantoprazole   40 mg Oral Daily   ramipril   10 mg Oral Daily   sodium chloride  flush  3 mL Intravenous Q12H   Continuous Infusions:  heparin  1,650 Units/hr (01/28/24 1608)      LOS: 2 days   Erle Odell Castor  [facility]  01/29/2024, 6:29 AM
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
## 2024-01-29 NOTE — Progress Notes (Signed)
  Progress Note  Patient Name: Colin Ramirez Date of Encounter: 01/29/2024 [facility] Cardiologist: MARALEE ELSPETH BROCKS, MD   Interval Summary   No acute overnight events. No chest pain today. Some discomfort in lower abdomen.  No new or acute complaints.   Vital Signs Vitals:   01/29/24 0415 01/29/24 0828 01/29/24 0924 01/29/24 1146  BP: (!) 144/73 138/79 138/79 139/79  Pulse: (!) 53 (!) 54  60  Resp: 20 (!) 21    Temp: (!) 97.5 F (36.4 C)   97.6 F (36.4 C)  TempSrc: Oral   Oral  SpO2:  97%  98%  Weight: 112.8 kg     Height:        Intake/Output Summary (Last 24 hours) at 01/29/2024 1258 Last data filed at 01/29/2024 0900 Gross per 24 hour  Intake 120 ml  Output --  Net 120 ml      01/29/2024    4:15 AM 01/28/2024    5:05 AM 01/27/2024    4:07 AM  Last 3 Weights  Weight (lbs) 248 lb 9.6 oz 244 lb 14.9 oz 240 lb 8.4 oz  Weight (kg) 112.764 kg 111.1 kg 109.1 kg      Telemetry/ECG  SR / SB with Mobitz I - Personally Reviewed  Physical Exam  General: Well developed, in no acute distress.  Neck: No JVD.  Cardiac: Bradycardic, regular rhythm.  Resp: Normal work of breathing.  Ext: No edema.  Neuro: No gross focal deficits.  Psych: Normal affect.   Assessment & Plan  58 y.o. male with known CAD, DM, HLD, HTN and ongoing tobacco abuse is seen for evaluation of chest pain. Found to have multivessel CAD and bicuspid AV w/ mild-mod stenosis and ascending thoracic aortic aneurysm.   #. Unstable angina: Cardiac cath shows complex stenosis in the proximal to mid LAD spanning 2 large diagonal branches. Also disease in small OM and distal RCA -Recommend CABG/AVR/Bentall procedure next week. CT surgery consulted.  -Recommend nitroglycerin  infusion if he has recurrent CP while awaiting surgery.   -Continue heparin  with close monitoring of PTT and Hgb.  #. Ascending thoracic aneurysm 4.9 cm.  #. Mild to moderate Aortic stenosis -Recommend CABG/AVR/Bentall procedure  next week. CT surgery consulted.   #. Tobacco abuse.  -Counseled on smoking cessation.  #. DM A1c 7.4%. - Per medical team  #. HLD  - High dose statin. LDL at goal 46.   #. HTN controlled -Per medical team  #. Abdominal pain  - Per medical team  For questions or updates, please contact [facility] Please consult [email or web address] for contact info under       Signed, [name], MD

## 2024-01-30 ENCOUNTER — Encounter (HOSPITAL_COMMUNITY)

## 2024-01-30 DIAGNOSIS — R0789 Other chest pain: Secondary | ICD-10-CM | POA: Diagnosis not present

## 2024-01-30 DIAGNOSIS — I200 Unstable angina: Secondary | ICD-10-CM | POA: Diagnosis not present

## 2024-01-30 LAB — GLUCOSE, CAPILLARY
Glucose-Capillary: 140 mg/dL — ABNORMAL HIGH (ref 70–99)
Glucose-Capillary: 163 mg/dL — ABNORMAL HIGH (ref 70–99)
Glucose-Capillary: 159 mg/dL — ABNORMAL HIGH (ref 70–99)
Glucose-Capillary: 221 mg/dL — ABNORMAL HIGH (ref 70–99)

## 2024-01-30 LAB — CBC
HCT: 39.9 % (ref 39.0–52.0)
Hemoglobin: 12.8 g/dL — ABNORMAL LOW (ref 13.0–17.0)
MCH: 29.1 pg (ref 26.0–34.0)
MCHC: 32.1 g/dL (ref 30.0–36.0)
MCV: 90.7 fL (ref 80.0–100.0)
Platelets: 225 K/uL (ref 150–400)
RBC: 4.4 MIL/uL (ref 4.22–5.81)
RDW: 15.9 % — ABNORMAL HIGH (ref 11.5–15.5)
WBC: 6.4 K/uL (ref 4.0–10.5)
nRBC: 0 % (ref 0.0–0.2)

## 2024-01-30 LAB — BASIC METABOLIC PANEL WITH GFR
Anion gap: 9 (ref 5–15)
BUN: 12 mg/dL (ref 6–20)
CO2: 25 mmol/L (ref 22–32)
Calcium: 8.9 mg/dL (ref 8.9–10.3)
Chloride: 105 mmol/L (ref 98–111)
Creatinine, Ser: 0.69 mg/dL (ref 0.61–1.24)
GFR, Estimated: >60 mL/min (ref >60)
Glucose, Bld: 194 mg/dL — ABNORMAL HIGH (ref 70–99)
Potassium: 3.8 mmol/L (ref 3.5–5.1)
Sodium: 139 mmol/L (ref 135–145)

## 2024-01-30 LAB — HEPARIN LEVEL (UNFRACTIONATED): Heparin Unfractionated: 0.33 [IU]/mL (ref 0.30–0.70)

## 2024-01-30 MED ORDER — CLINDAMYCIN PHOSPHATE 600 MG/50ML IV SOLN
600.0000 mg | INTRAVENOUS | Status: AC
  Administered 2024-01-31: 600 mg via INTRAVENOUS
  Filled 2024-01-30: qty 50

## 2024-01-30 MED ORDER — NITROGLYCERIN IN D5W 200-5 MCG/ML-% IV SOLN
0.0000 ug/min | INTRAVENOUS | Status: DC
  Filled 2024-01-30: qty 250

## 2024-01-30 MED ORDER — NITROGLYCERIN IN D5W 200-5 MCG/ML-% IV SOLN
0.0000 ug/min | INTRAVENOUS | Status: DC
  Administered 2024-01-30 – 2024-02-05 (×2): 5 ug/min via INTRAVENOUS
  Filled 2024-01-30: qty 250

## 2024-01-30 MED ORDER — HYDROMORPHONE HCL 2 MG PO TABS
4.0000 mg | ORAL_TABLET | ORAL | Status: DC | PRN
  Administered 2024-01-30 – 2024-02-05 (×18): 4 mg via ORAL
  Filled 2024-01-30 (×18): qty 2

## 2024-01-30 MED ORDER — MORPHINE SULFATE (PF) 2 MG/ML IV SOLN
1.0000 mg | Freq: Once | INTRAVENOUS | Status: AC
  Administered 2024-01-30: 1 mg via INTRAVENOUS
  Filled 2024-01-30: qty 1

## 2024-01-30 MED ORDER — INSULIN GLARGINE-YFGN 100 UNIT/ML ~~LOC~~ SOLN
10.0000 [IU] | Freq: Two times a day (BID) | SUBCUTANEOUS | Status: DC
  Administered 2024-01-30 – 2024-02-02 (×8): 10 [IU] via SUBCUTANEOUS
  Filled 2024-01-30 (×10): qty 0.1

## 2024-01-30 NOTE — Plan of Care (Signed)
  Problem: Clinical Measurements: Goal: Ability to maintain clinical measurements within normal limits will improve Outcome: Progressing Goal: Diagnostic test results will improve Outcome: Progressing   Problem: Pain Managment: Goal: General experience of comfort will improve and/or be controlled Outcome: Progressing   Problem: Education: Goal: Ability to describe self-care measures that may prevent or decrease complications (Diabetes Survival Skills Education) will improve Outcome: Progressing

## 2024-01-30 NOTE — Progress Notes (Addendum)
 Progress Note  Patient Name: Colin Ramirez Date of Encounter: 01/30/2024 [facility] Cardiologist: MARALEE ELSPETH BROCKS, MD  Interval Summary   Patient reports that he had a very rough time overnight.  He had issues with chest pain and hip pain.  His chest pain was worse than usual, initially improved with nitroglycerin  and then returned.  Started on nitroglycerin  drip and he is currently chest pain-free.  He has also been having quite a bit of hip pain.  He has had surgery on both hips and normally takes some pain medication before he goes to bed to help him sleep.   Vital Signs Vitals:   01/30/24 0510 01/30/24 0705 01/30/24 0719 01/30/24 0735  BP: 120/82 (!) 152/92 (!) 145/89 (!) 136/92  Pulse: (!) 54   (!) 55  Resp: 18 18 16 18   Temp:    97.7 F (36.5 C)  TempSrc:    Oral  SpO2: 92%   95%  Weight:      Height:        Intake/Output Summary (Last 24 hours) at 01/30/2024 1018 Last data filed at 01/30/2024 0900 Gross per 24 hour  Intake 1029.08 ml  Output --  Net 1029.08 ml      01/29/2024    4:15 AM 01/28/2024    5:05 AM 01/27/2024    4:07 AM  Last 3 Weights  Weight (lbs) 248 lb 9.6 oz 244 lb 14.9 oz 240 lb 8.4 oz  Weight (kg) 112.764 kg 111.1 kg 109.1 kg      Telemetry/ECG  Sinus bradycardia with occasional nonconducted PACs, normal sinus rhythm- Personally Reviewed  Physical Exam  GEN: No acute distress.  Sitting in the armchair Neck: No JVD Cardiac: RRR, no murmurs, rubs, or gallops.  Respiratory: Clear to auscultation bilaterally.  Normal work of breathing on room air GI: Soft, nontender, non-distended  MS: No edema  Assessment & Plan   Unstable Angina CAD  - Cath this admission showed severe mid LAD stenosis,severe proximal circumflex stenosis, severe stenosis in the distal OM, moderate mid-distal RCA stenosis - Case was reviewed with the IC team - plan for CABG + Bentall procedure. CT surgery working on timing. Undergoing dental extractions tomorrow   -Patient has been having recurrent chest pain.  Seems to be well-controlled with IV nitroglycerin . - Continue IV heparin   - Continue ASA 81 mg daily  - Continue amlodipine  2.5 mg daily - Continue lipitor 40 mg daily   Ascending thoracic aneurysm  Mild-moderate AS  - CTA this admission with aneurysmal dilatation of the ascending thoracic aorta measuring 4.9 cm  - Pending Bentall procedure as above   Tobacco use  - Patient has been counseled on tobacco cessation   HLD  - Lipid panel this admission with LDL 46  - Continue lipitor 40 mg daily   HTN  - Continue amlodipine  2.5 mg daily   For questions or updates, please contact [facility] Please consult [email or web address] for contact info under       Signed, Colin FABIENE Louder, PA-C   Patient seen, examined. Available data reviewed. Agree with findings, assessment, and plan as outlined by Colin Louder, PA-C.  The patient is independently seen examined.  He is alert and oriented, in no acute distress.  Pt is alert and oriented, in NAD HEENT: normal Neck: JVP - normal Lungs: CTA bilaterally CV: RRR with distant heart sounds, 2/6 systolic murmur at the RUSB Abd: soft, NT, Positive BS, no hepatomegaly Ext: no C/C/E, distal pulses intact  and equal Skin: warm/dry no rash  CP improved on IV NTG now, somewhat of a chronic problem for him. Plans noted for dental extraction tomorrow and Bentall surgery later this week. Otherwise, as outlined above.   Colin Ramirez, M.D. 01/30/2024 12:19 PM

## 2024-01-30 NOTE — Progress Notes (Signed)
 [facility] PROGRESS NOTE    Progress Note  ROMA BIERLEIN  FMW:[id number] DOB: 02-18-1966 DOA: 01/24/2024 PCP: [name], MD (Inactive)     Brief Narrative:   Colin Ramirez is an 58 y.o. male past medical history significant for coronary artery disease, with a left heart cath on 10/28/2017 that showed two-vessel disease with moderate stenosis, diabetes mellitus type 2, essential hypertension admitted to [facility] when he started having chest pain intermittent nonradiating that started about 6 days prior to admission not associated with shortness of breath or palpitation he does relate recent nausea and vomiting.  Cardiac biomarkers are basically remained negative, twelve-lead EKG showed normal sinus rhythm normal axis no T wave abnormalities. Left heart cath on May 2019 that showed moderate two-vessel disease.  Assessment/Plan:   Unstable angina: Continue IV heparin  aspirin , beta-blockers and statins.   Left heart cath showed multivessel disease.  CT angio that showed ascending aortic aneurysm measuring 5 cm no dissection. Dentist was consulted recommended multiple tooth removal in the OR early next week. CT surgery was consulted recommended CABG/AVR and Bentall procedure next week timing of the surgery to be determined. Still having chest pain started on IV nitroglycerin  drip. Further management per CV surgery.  Hypokalemia: Repleted now resolved.  AKI: Likely hemodynamic mediated. Improve with IV fluids.  Uncontrolled Diabetes mellitus type 2: Hemoglobin A1c was 7.4 Blood glucose slightly elevated increase long-acting insulin  to twice a day continue sliding scale insulin . Holding all oral hypoglycemic agents.  Pinkeye unilaterally: Started on eyedrops, and erythromycin  ointment. I advised.  He relates to feeling better.  Essential hypertension: HCTZ was held on admission. Continue Coreg .  Hyperlipidemia: Continue  statins.  GERD: Continue PPI  Morbid obesity with a BMI greater than 30: Noted.    DVT prophylaxis: lovenox  Family Communication:noen Status is: Observation The patient remains OBS appropriate and will d/c before 2 midnights.    Code Status:     Code Status Orders  (From admission, onward)           Start     Ordered   01/24/24 2017  Full code  Continuous       Question:  By:  Answer:  Consent: discussion documented in EHR   01/24/24 2017           Code Status History     Date Active Date Inactive Code Status Order ID Comments User Context   06/07/2018 2112 06/08/2018 2158 Full Code [id number]  Lonzell Emeline HERO, DO ED   10/28/2017 1648 10/29/2017 1450 Full Code [id number]  Maxie Herlene POUR, PA-C ED   08/20/2016 1306 08/21/2016 1715 Full Code [id number]  Vernetta Lonni GRADE, MD Inpatient         IV Access:   Peripheral IV   Procedures and diagnostic studies:   No results found.    Medical Consultants:   None.   Subjective:    MONTERRIUS CARDOSA relates he is having chest pain  Objective:    Vitals:   01/30/24 0447 01/30/24 0456 01/30/24 0501 01/30/24 0510  BP: (!) 151/90 121/82 115/81 120/82  Pulse: (!) 53 (!) 59 (!) 56 (!) 54  Resp: 18  15 18   Temp: 97.6 F (36.4 C)     TempSrc:      SpO2: 97% 92% 92% 92%  Weight:      Height:       SpO2: 92 %   Intake/Output Summary (Last 24 hours) at 01/30/2024 9343 Last data filed at  01/29/2024 1400 Gross per 24 hour  Intake 850.08 ml  Output --  Net 850.08 ml    Filed Weights   01/27/24 0407 01/28/24 0505 01/29/24 0415  Weight: 109.1 kg 111.1 kg 112.8 kg    Exam: General exam: In no acute distress. Respiratory system: Good air movement and clear to auscultation. Cardiovascular system: S1 & S2 heard, RRR. No JVD. Gastrointestinal system: Abdomen is nondistended, soft and nontender.  Extremities: No pedal edema. Skin: No rashes, lesions or ulcers Psychiatry: Judgement and insight appear  normal. Mood & affect appropriate. Data Reviewed:    Labs: Basic Metabolic Panel: Recent Labs  Lab 01/24/24 1557 01/24/24 1803 01/24/24 1956 01/25/24 0511 01/26/24 1014 01/26/24 1645 01/27/24 0234 01/28/24 0302  NA 136  --   --  138 137 140  135 138 140  K 2.5*  --    < > 3.5 3.6 3.4*  2.2* 3.5 3.7  CL 97*  --   --  98 103  --  102 107  CO2 19*  --   --  28 25  --  27 25  GLUCOSE 202*  --   --  147* 143*  --  166* 202*  BUN 19  --   --  16 13  --  15 16  CREATININE 1.32*  --   --  0.93 0.66  --  0.72 0.68  CALCIUM  8.7*  --   --  9.0 8.8*  --  8.6* 8.5*  MG  --  2.1  --  2.0  --   --   --   --    < > = values in this interval not displayed.   GFR Estimated Creatinine Clearance: 129.3 mL/min (by C-G formula based on SCr of 0.68 mg/dL). Liver Function Tests: Recent Labs  Lab 01/25/24 0511  AST 19  ALT 20  ALKPHOS 44  BILITOT 0.7  PROT 5.9*  ALBUMIN 3.3*   Recent Labs  Lab 01/25/24 0511  LIPASE 37   No results for input(s): AMMONIA in the last 168 hours. Coagulation profile No results for input(s): INR, PROTIME in the last 168 hours. COVID-19 Labs  No results for input(s): DDIMER, FERRITIN, LDH, CRP in the last 72 hours.  Lab Results  Component Value Date   SARSCOV2NAA RESULT: NEGATIVE 08/24/2019   SARSCOV2NAA Not Detected 05/11/2019   SARSCOV2NAA Not Detected 01/17/2019    CBC: Recent Labs  Lab 01/25/24 0511 01/26/24 1645 01/27/24 0234 01/28/24 0302 01/29/24 0244 01/30/24 0304  WBC 6.1  --  6.2 5.8 6.0 6.4  NEUTROABS 3.6  --   --   --   --   --   HGB 14.3 13.3  10.9* 12.9* 12.8* 12.9* 12.8*  HCT 42.8 39.0  32.0* 39.6 39.2 39.8 39.9  MCV 89.4  --  89.8 90.5 91.3 90.7  PLT 225  --  219 196 215 225   Cardiac Enzymes: No results for input(s): CKTOTAL, CKMB, CKMBINDEX, TROPONINI in the last 168 hours. BNP (last 3 results) No results for input(s): PROBNP in the last 8760 hours. CBG: Recent Labs  Lab 01/29/24 0643  01/29/24 1147 01/29/24 1704 01/29/24 2110 01/30/24 0624  GLUCAP 216* 181* 159* 197* 140*   D-Dimer: No results for input(s): DDIMER in the last 72 hours. Hgb A1c: No results for input(s): HGBA1C in the last 72 hours.  Lipid Profile: No results for input(s): CHOL, HDL, LDLCALC, TRIG, CHOLHDL, LDLDIRECT in the last 72 hours.  Thyroid  function studies: No results for  input(s): TSH, T4TOTAL, T3FREE, THYROIDAB in the last 72 hours.  Invalid input(s): FREET3 Anemia work up: No results for input(s): VITAMINB12, FOLATE, FERRITIN, TIBC, IRON, RETICCTPCT in the last 72 hours. Sepsis Labs: Recent Labs  Lab 01/27/24 0234 01/28/24 0302 01/29/24 0244 01/30/24 0304  WBC 6.2 5.8 6.0 6.4   Microbiology No results found for this or any previous visit (from the past 240 hours).   Medications:    amLODipine   2.5 mg Oral Daily   artificial tears   Both Eyes Q8H   aspirin  EC  81 mg Oral Daily   atorvastatin   40 mg Oral Daily   erythromycin   1 Application Both Eyes Q6H   insulin  aspart  0-5 Units Subcutaneous QHS   insulin  aspart  0-9 Units Subcutaneous TID WC   insulin  aspart  3 Units Subcutaneous TID WC   insulin  glargine-yfgn  8 Units Subcutaneous BID   isosorbide  mononitrate  30 mg Oral Daily   levothyroxine   175 mcg Oral Daily   pantoprazole   40 mg Oral Daily   ramipril   10 mg Oral Daily   sodium chloride  flush  3 mL Intravenous Q12H   Continuous Infusions:  heparin  1,650 Units/hr (01/29/24 2353)      LOS: 3 days   Erle Odell Castor  [facility]  01/30/2024, 6:56 AM

## 2024-01-30 NOTE — Progress Notes (Signed)
 Mobility Specialist Progress Note:    01/30/24 1453  Mobility  Activity Ambulated independently  Level of Assistance Independent  Assistive Device None  Distance Ambulated (ft) 500 ft  Activity Response Tolerated well  Mobility Referral Yes  Mobility visit 1 Mobility  Mobility Specialist Start Time (ACUTE ONLY) 1453  Mobility Specialist Stop Time (ACUTE ONLY) 1505  Mobility Specialist Time Calculation (min) (ACUTE ONLY) 12 min   Pt pleasant and eager to walk. C/o of minor chest pain (RN and team already aware of). Pt able to move and do things w/o assistance. Returned pt to recliner w/ all needs met.   Venetia Keel Mobility Specialist Please Neurosurgeon or Rehab Office at [phone]
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
## 2024-01-30 NOTE — Progress Notes (Signed)
 PT called out c/o of 8/10 sharp chest pain. Gave nitroglycerin  SL per prn order and notified on call provider. EKG obtained per provider request. CP improved to 7/10 but now also endorses chest pressure and patient tearful. 119 cp back to 8/10 and can feel the pain up into his neck. Provider updated. nitroglycerin  drip ordered. Right before starting nitroglycerin  drip patient reports chest pain subsided. Asked provider if he still wanted me to start nitroglycerin  drip and he said no and discontinued medication.

## 2024-01-31 ENCOUNTER — Inpatient Hospital Stay (HOSPITAL_COMMUNITY): Payer: PRIVATE HEALTH INSURANCE

## 2024-01-31 ENCOUNTER — Other Ambulatory Visit: Payer: Self-pay

## 2024-01-31 ENCOUNTER — Encounter (HOSPITAL_COMMUNITY)
Admission: EM | Disposition: A | Discharge status: Home / Self Care | Payer: Self-pay | Source: Home / Self Care | Attending: Internal Medicine

## 2024-01-31 ENCOUNTER — Encounter (HOSPITAL_COMMUNITY): Payer: Self-pay | Admitting: Internal Medicine

## 2024-01-31 DIAGNOSIS — I10 Essential (primary) hypertension: Secondary | ICD-10-CM | POA: Diagnosis not present

## 2024-01-31 DIAGNOSIS — K029 Dental caries, unspecified: Secondary | ICD-10-CM

## 2024-01-31 DIAGNOSIS — I25119 Atherosclerotic heart disease of native coronary artery with unspecified angina pectoris: Secondary | ICD-10-CM

## 2024-01-31 DIAGNOSIS — Q2381 Bicuspid aortic valve: Secondary | ICD-10-CM | POA: Diagnosis not present

## 2024-01-31 DIAGNOSIS — F17210 Nicotine dependence, cigarettes, uncomplicated: Secondary | ICD-10-CM | POA: Diagnosis not present

## 2024-01-31 DIAGNOSIS — R0789 Other chest pain: Secondary | ICD-10-CM | POA: Diagnosis not present

## 2024-01-31 DIAGNOSIS — I350 Nonrheumatic aortic (valve) stenosis: Secondary | ICD-10-CM | POA: Diagnosis not present

## 2024-01-31 HISTORY — PX: TOOTH EXTRACTION: SHX859

## 2024-01-31 LAB — GLUCOSE, CAPILLARY
Glucose-Capillary: 150 mg/dL — ABNORMAL HIGH (ref 70–99)
Glucose-Capillary: 133 mg/dL — ABNORMAL HIGH (ref 70–99)
Glucose-Capillary: 108 mg/dL — ABNORMAL HIGH (ref 70–99)
Glucose-Capillary: 146 mg/dL — ABNORMAL HIGH (ref 70–99)
Glucose-Capillary: 150 mg/dL — ABNORMAL HIGH (ref 70–99)
Glucose-Capillary: 191 mg/dL — ABNORMAL HIGH (ref 70–99)

## 2024-01-31 LAB — CBC
HCT: 41.3 % (ref 39.0–52.0)
Hemoglobin: 13.1 g/dL (ref 13.0–17.0)
MCH: 29.2 pg (ref 26.0–34.0)
MCHC: 31.7 g/dL (ref 30.0–36.0)
MCV: 92 fL (ref 80.0–100.0)
Platelets: 213 K/uL (ref 150–400)
RBC: 4.49 MIL/uL (ref 4.22–5.81)
RDW: 15.9 % — ABNORMAL HIGH (ref 11.5–15.5)
WBC: 6.8 K/uL (ref 4.0–10.5)
nRBC: 0 % (ref 0.0–0.2)

## 2024-01-31 LAB — HEPARIN LEVEL (UNFRACTIONATED): Heparin Unfractionated: 0.32 [IU]/mL (ref 0.30–0.70)

## 2024-01-31 SURGERY — DENTAL RESTORATION/EXTRACTIONS
Anesthesia: Monitor Anesthesia Care

## 2024-01-31 MED ORDER — LIDOCAINE-EPINEPHRINE 2 %-1:100000 IJ SOLN
INTRAMUSCULAR | Status: AC
Start: 2024-01-31 — End: 2024-01-31
  Filled 2024-01-31: qty 1

## 2024-01-31 MED ORDER — OXYMETAZOLINE HCL 0.05 % NA SOLN
NASAL | Status: AC
  Filled 2024-01-31: qty 30

## 2024-01-31 MED ORDER — DEXAMETHASONE SODIUM PHOSPHATE 10 MG/ML IJ SOLN
INTRAMUSCULAR | Status: AC
  Filled 2024-01-31: qty 1

## 2024-01-31 MED ORDER — CHLORHEXIDINE GLUCONATE 0.12 % MT SOLN
OROMUCOSAL | Status: AC
  Administered 2024-01-31: 15 mL via OROMUCOSAL
  Filled 2024-01-31: qty 15

## 2024-01-31 MED ORDER — FENTANYL CITRATE (PF) 100 MCG/2ML IJ SOLN
INTRAMUSCULAR | Status: DC | PRN
  Administered 2024-01-31: 50 ug via INTRAVENOUS

## 2024-01-31 MED ORDER — PROPOFOL 10 MG/ML IV BOLUS
INTRAVENOUS | Status: AC
  Filled 2024-01-31: qty 20

## 2024-01-31 MED ORDER — FENTANYL CITRATE (PF) 100 MCG/2ML IJ SOLN: 25.0000 ug | INTRAMUSCULAR | Status: DC | PRN

## 2024-01-31 MED ORDER — ROCURONIUM BROMIDE 10 MG/ML (PF) SYRINGE
PREFILLED_SYRINGE | INTRAVENOUS | Status: AC
Start: 2024-01-31 — End: 2024-01-31
  Filled 2024-01-31: qty 10

## 2024-01-31 MED ORDER — PHENYLEPHRINE 80 MCG/ML (10ML) SYRINGE FOR IV PUSH (FOR BLOOD PRESSURE SUPPORT)
PREFILLED_SYRINGE | INTRAVENOUS | Status: AC
  Filled 2024-01-31: qty 10

## 2024-01-31 MED ORDER — ONDANSETRON HCL 4 MG/2ML IJ SOLN
INTRAMUSCULAR | Status: AC
  Filled 2024-01-31: qty 2

## 2024-01-31 MED ORDER — FENTANYL CITRATE (PF) 250 MCG/5ML IJ SOLN
INTRAMUSCULAR | Status: AC
  Filled 2024-01-31: qty 5

## 2024-01-31 MED ORDER — ORAL CARE MOUTH RINSE: 15.0000 mL | Freq: Once | OROMUCOSAL | Status: AC

## 2024-01-31 MED ORDER — MUSCLE RUB 10-15 % EX CREA
TOPICAL_CREAM | CUTANEOUS | Status: DC | PRN
  Filled 2024-01-31: qty 85

## 2024-01-31 MED ORDER — DEXMEDETOMIDINE HCL IN NACL 80 MCG/20ML IV SOLN
INTRAVENOUS | Status: AC
Start: 2024-01-31 — End: 2024-01-31
  Filled 2024-01-31: qty 20

## 2024-01-31 MED ORDER — EPHEDRINE 5 MG/ML INJ
INTRAVENOUS | Status: AC
  Filled 2024-01-31: qty 5

## 2024-01-31 MED ORDER — MIDAZOLAM HCL 2 MG/2ML IJ SOLN
INTRAMUSCULAR | Status: AC
  Filled 2024-01-31: qty 2

## 2024-01-31 MED ORDER — HEPARIN (PORCINE) 25000 UT/250ML-% IV SOLN
1950.0000 [IU]/h | INTRAVENOUS | Status: DC
  Administered 2024-01-31 – 2024-02-01 (×2): 1650 [IU]/h via INTRAVENOUS
  Administered 2024-02-02: 1800 [IU]/h via INTRAVENOUS
  Administered 2024-02-02: 1850 [IU]/h via INTRAVENOUS
  Administered 2024-02-03: 1800 [IU]/h via INTRAVENOUS
  Administered 2024-02-03 – 2024-02-06 (×6): 1950 [IU]/h via INTRAVENOUS
  Filled 2024-01-31 (×9): qty 250

## 2024-01-31 MED ORDER — LIDOCAINE 2% (20 MG/ML) 5 ML SYRINGE
INTRAMUSCULAR | Status: AC
  Filled 2024-01-31: qty 5

## 2024-01-31 MED ORDER — OXYCODONE HCL 5 MG PO TABS: 5.0000 mg | ORAL_TABLET | Freq: Once | ORAL | Status: DC | PRN

## 2024-01-31 MED ORDER — CHLORHEXIDINE GLUCONATE 0.12 % MT SOLN: 15.0000 mL | Freq: Once | OROMUCOSAL | Status: AC

## 2024-01-31 MED ORDER — ACETAMINOPHEN 10 MG/ML IV SOLN
1000.0000 mg | Freq: Once | INTRAVENOUS | Status: DC | PRN
Start: 2024-01-31 — End: 2024-01-31

## 2024-01-31 MED ORDER — AMISULPRIDE (ANTIEMETIC) 5 MG/2ML IV SOLN: 10.0000 mg | Freq: Once | INTRAVENOUS | Status: DC | PRN

## 2024-01-31 MED ORDER — 0.9 % SODIUM CHLORIDE (POUR BTL) OPTIME
TOPICAL | Status: DC | PRN
  Administered 2024-01-31: 1000 mL

## 2024-01-31 MED ORDER — DEXMEDETOMIDINE HCL IN NACL 80 MCG/20ML IV SOLN
INTRAVENOUS | Status: DC | PRN
Start: 2024-01-31 — End: 2024-01-31
  Administered 2024-01-31: 8 ug via INTRAVENOUS
  Administered 2024-01-31: 6 ug via INTRAVENOUS

## 2024-01-31 MED ORDER — LACTATED RINGERS IV SOLN: INTRAVENOUS | Status: DC

## 2024-01-31 MED ORDER — OXYCODONE HCL 5 MG/5ML PO SOLN: 5.0000 mg | Freq: Once | ORAL | Status: DC | PRN

## 2024-01-31 MED ORDER — MIDAZOLAM HCL 2 MG/2ML IJ SOLN
INTRAMUSCULAR | Status: DC | PRN
  Administered 2024-01-31: 2 mg via INTRAVENOUS

## 2024-01-31 MED ORDER — SODIUM CHLORIDE 0.9 % IR SOLN
Status: DC | PRN
  Administered 2024-01-31: 1000 mL

## 2024-01-31 SURGICAL SUPPLY — 26 items
BAG COUNTER SPONGE SURGICOUNT (BAG) IMPLANT
BLADE SURG 15 STRL LF DISP TIS (BLADE) ×1 IMPLANT
BUR CROSS CUT FISSURE 1.6 (BURR) ×1 IMPLANT
BUR EGG ELITE 4.0 (BURR) IMPLANT
CANISTER SUCTION 3000ML PPV (SUCTIONS) ×1 IMPLANT
COVER SURGICAL LIGHT HANDLE (MISCELLANEOUS) ×1 IMPLANT
GAUZE PACKING FOLDED 2 STR (GAUZE/BANDAGES/DRESSINGS) ×1 IMPLANT
GLOVE BIO SURGEON STRL SZ8 (GLOVE) ×1 IMPLANT
GOWN STRL REUS W/ TWL LRG LVL3 (GOWN DISPOSABLE) ×1 IMPLANT
GOWN STRL REUS W/ TWL XL LVL3 (GOWN DISPOSABLE) ×1 IMPLANT
IV NS 1000ML BAXH (IV SOLUTION) ×1 IMPLANT
KIT BASIN OR (CUSTOM PROCEDURE TRAY) ×1 IMPLANT
KIT TURNOVER KIT B (KITS) ×1 IMPLANT
NDL HYPO 25GX1X1/2 BEV (NEEDLE) ×2 IMPLANT
NEEDLE HYPO 25GX1X1/2 BEV (NEEDLE) ×2 IMPLANT
NS IRRIG 1000ML POUR BTL (IV SOLUTION) ×1 IMPLANT
PAD ARMBOARD POSITIONER FOAM (MISCELLANEOUS) ×1 IMPLANT
SLEEVE IRRIGATION ELITE 7 (MISCELLANEOUS) ×1 IMPLANT
SPIKE FLUID TRANSFER (MISCELLANEOUS) IMPLANT
SUT CHROMIC 3 0 SH 27 (SUTURE) IMPLANT
SUT PLAIN 3 0 PS2 27 (SUTURE) IMPLANT
SYR BULB IRRIG 60ML STRL (SYRINGE) ×1 IMPLANT
SYR CONTROL 10ML LL (SYRINGE) ×1 IMPLANT
TRAY ENT MC OR (CUSTOM PROCEDURE TRAY) ×1 IMPLANT
TUBING IRRIGATION (MISCELLANEOUS) ×1 IMPLANT
YANKAUER SUCT BULB TIP NO VENT (SUCTIONS) ×1 IMPLANT

## 2024-01-31 NOTE — Op Note (Addendum)
 01/24/2024 - 01/31/2024  2:46 PM  PATIENT:  Colin Ramirez  59 y.o. male  PRE-OPERATIVE DIAGNOSIS:  nonrestorable tooth # 18.  POST-OPERATIVE DIAGNOSIS:  SAME  PROCEDURE:  Procedure(s): EXTRACTION tooth # 18.  SURGEON:  Surgeon(s): Sheryle Hamilton, DMD  ANESTHESIA:   local and MAC  EBL:  minimal  DRAINS: none   SPECIMEN:  No Specimen  COUNTS:  YES  PLAN OF CARE: To floor after PACU  PATIENT DISPOSITION:  PACU - hemodynamically stable.   PROCEDURE DETAILS: Dictation #[id number]  Hamilton EMERSON Sheryle, DMD 01/31/2024 2:46 PM  If you need to reach me, call my cell [phone]. I am not on EPIC after hours.

## 2024-01-31 NOTE — Progress Notes (Addendum)
 Progress Note  Patient Name: Colin Ramirez Date of Encounter: 01/31/2024 [facility] Cardiologist: MARALEE ELSPETH BROCKS, MD   Interval Summary   Patient had an episode of mild chest pain, but overall his chest pain has been well controlled on IV nitroglycerin . His main complaint is hip pain. Scheduled for dental extraction today   Vital Signs Vitals:   01/30/24 2300 01/30/24 2356 01/31/24 0430 01/31/24 0739  BP:  (!) 156/86 (!) 146/88 (!) 140/92  Pulse:   (!) 52 (!) 54  Resp: 16 20 18 20   Temp:  97.8 F (36.6 C) 97.7 F (36.5 C) 97.6 F (36.4 C)  TempSrc:  Oral Oral Oral  SpO2:  94% 95% 96%  Weight:   112.3 kg   Height:        Intake/Output Summary (Last 24 hours) at 01/31/2024 0951 Last data filed at 01/31/2024 0734 Gross per 24 hour  Intake 958.04 ml  Output 400 ml  Net 558.04 ml      01/31/2024    4:30 AM 01/29/2024    4:15 AM 01/28/2024    5:05 AM  Last 3 Weights  Weight (lbs) 247 lb 8 oz 248 lb 9.6 oz 244 lb 14.9 oz  Weight (kg) 112.265 kg 112.764 kg 111.1 kg      Telemetry/ECG  Sinus rhythm with HR in the 50s. Occasional non-conducted PACs - Personally Reviewed  Physical Exam  GEN: No acute distress.  Sitting upright on the bed  Neck: No JVD Cardiac:  RRR, no murmurs, rubs, or gallops.  Respiratory: Clear to auscultation bilaterally. GI: Soft, nontender, non-distended  MS: No edema in BLE  Assessment & Plan   Unstable Angina CAD  - Cath this admission showed severe mid LAD stenosis,severe proximal circumflex stenosis, severe stenosis in the distal OM, moderate mid-distal RCA stenosis - Case was reviewed with the IC team - plan for CABG + Bentall procedure. CT surgery working on timing. Undergoing dental extractions today - Chest pain controlled with IV nitroglycerin   - Continue IV heparin  (currently held for dental extraction today)  - Continue ASA 81 mg daily  - Continue amlodipine  2.5 mg daily - Continue lipitor 40 mg daily    Ascending  thoracic aneurysm  Mild-moderate AS  - CTA this admission with aneurysmal dilatation of the ascending thoracic aorta measuring 4.9 cm  - Pending Bentall procedure as above    Tobacco use  - Patient has been counseled on tobacco cessation    HLD  - Lipid panel this admission with LDL 46  - Continue lipitor 40 mg daily    HTN  - Continue amlodipine  2.5 mg daily   For questions or updates, please contact  [facility] Please consult [email or web address] for contact info under       Signed, Rollo FABIENE Louder, PA-C   Patient seen, examined. Available data reviewed. Agree with findings, assessment, and plan as outlined by Rollo Louder, PA-C.  The patient is independently interviewed and examined.  On my exam today:  Vitals:   01/31/24 1154 01/31/24 1321  BP: (!) 144/83 (!) 175/84  Pulse: 64 (!) 48  Resp: 17 17  Temp: 97.8 F (36.6 C) 97.8 F (36.6 C)  SpO2: 95%    Pt is alert and oriented, NAD HEENT: normal Neck: JVP - normal Lungs: CTA bilaterally CV: RRR with 2/6 mid peaking crescendo decrescendo murmur at the right upper sternal border Abd: soft, NT, Positive BS, no hepatomegaly Ext: no C/C/E, distal pulses intact and equal Skin: warm/dry  no rash  The patient is doing well from a cardiac perspective, awaiting CABG and Bentall surgery next week.  Pending dental extraction and patient will be restarted back on IV heparin .  He also continues on IV nitroglycerin .  Primarily complaining of pain in his hips at present.  Otherwise stable from a cardiac perspective.  Ozell Fell, M.D. 01/31/2024 2:31 PM

## 2024-01-31 NOTE — H&P (Signed)
 H&P documentation  -History and Physical Reviewed  -Patient has been re-examined  -No change in the plan of care  Colin Ramirez

## 2024-01-31 NOTE — Progress Notes (Signed)
 [facility] PROGRESS NOTE    Progress Note  Colin Ramirez  FMW:[id number] DOB: 11/05/1965 DOA: 01/24/2024 PCP: [name], MD (Inactive)     Brief Narrative:   Colin Ramirez is an 58 y.o. male past medical history significant for coronary artery disease, with a left heart cath on 10/28/2017 that showed two-vessel disease with moderate stenosis, diabetes mellitus type 2, essential hypertension admitted to [facility] when he started having chest pain intermittent nonradiating that started about 6 days prior to admission not associated with shortness of breath or palpitation he does relate recent nausea and vomiting.  Cardiac biomarkers are basically remained negative, twelve-lead EKG showed normal sinus rhythm normal axis no T wave abnormalities. Left heart cath on May 2019 that showed moderate two-vessel disease.  Assessment/Plan:   Unstable angina/ascending aortic aneurysm mild to moderate AAS: Continue IV heparin  aspirin , beta-blockers and statins.   Left heart cath showed multivessel disease.  CT angio that showed ascending aortic aneurysm measuring 5 cm no dissection. Dental extraction 8.5.2025.  On IV Clindamycin  CT surgery was consulted recommended CABG/AVR and Bentall procedure next week timing of the surgery to be determined. Chest pain controlled with IV nitroglycerin  and heparin . Further management per CV surgery and cardiology  Hypokalemia: Repleted now resolved.  AKI: Likely hemodynamic mediated.  KVO IV fluids  Uncontrolled Diabetes mellitus type 2: Hemoglobin A1c was 7.4 Blood glucose slightly elevated increase long-acting insulin  to twice a day continue sliding scale insulin . Holding all oral hypoglycemic agents.  Pinkeye unilaterally: Started on eyedrops, and erythromycin  ointment. I advised.  He relates to feeling better.  Essential hypertension: HCTZ was held on admission. Continue Coreg .  Hyperlipidemia: Continue  statins.  GERD: Continue PPI  Morbid obesity with a BMI greater than 30: Noted.  Bilateral hip pain:  Continue IV hydromorphone  for pain control.    DVT prophylaxis: lovenox  Family Communication:noen Status is: Observation The patient remains OBS appropriate and will d/c before 2 midnights.    Code Status:     Code Status Orders  (From admission, onward)           Start     Ordered   01/24/24 2017  Full code  Continuous       Question:  By:  Answer:  Consent: discussion documented in EHR   01/24/24 2017           Code Status History     Date Active Date Inactive Code Status Order ID Comments User Context   06/07/2018 2112 06/08/2018 2158 Full Code [id number]  Lonzell Emeline HERO, DO ED   10/28/2017 1648 10/29/2017 1450 Full Code [id number]  Maxie Herlene POUR, PA-C ED   08/20/2016 1306 08/21/2016 1715 Full Code [id number]  Vernetta Lonni GRADE, MD Inpatient         IV Access:   Peripheral IV   Procedures and diagnostic studies:   No results found.    Medical Consultants:   None.   Subjective:    Colin Ramirez chest pain is resolved now he is complaining of bilateral hip pain  Objective:    Vitals:   01/30/24 2004 01/30/24 2300 01/30/24 2356 01/31/24 0430  BP: (!) 142/89  (!) 156/86 (!) 146/88  Pulse: (P) 65   (!) 52  Resp: 19 16 20 18   Temp: (P) 98.7 F (37.1 C)  97.8 F (36.6 C) 97.7 F (36.5 C)  TempSrc: (P) Oral  Oral Oral  SpO2: (P) 94%  94% 95%  Weight:  112.3 kg  Height:       SpO2: 95 %   Intake/Output Summary (Last 24 hours) at 01/31/2024 0630 Last data filed at 01/31/2024 0433 Gross per 24 hour  Intake 1257.04 ml  Output 750 ml  Net 507.04 ml    Filed Weights   01/28/24 0505 01/29/24 0415 01/31/24 0430  Weight: 111.1 kg 112.8 kg 112.3 kg    Exam: General exam: In no acute distress. Respiratory system: Good air movement and clear to auscultation. Cardiovascular system: S1 & S2 heard, RRR. No  JVD. Gastrointestinal system: Abdomen is nondistended, soft and nontender.  Extremities: Trace edema, Skin: No rashes, lesions or ulcers Psychiatry: Judgement and insight appear normal. Mood & affect appropriate. Data Reviewed:    Labs: Basic Metabolic Panel: Recent Labs  Lab 01/24/24 1803 01/24/24 1956 01/25/24 0511 01/26/24 1014 01/26/24 1645 01/27/24 0234 01/28/24 0302 01/30/24 0859  NA  --   --  138 137 140  135 138 140 139  K  --    < > 3.5 3.6 3.4*  2.2* 3.5 3.7 3.8  CL  --   --  98 103  --  102 107 105  CO2  --   --  28 25  --  27 25 25   GLUCOSE  --   --  147* 143*  --  166* 202* 194*  BUN  --   --  16 13  --  15 16 12   CREATININE  --   --  0.93 0.66  --  0.72 0.68 0.69  CALCIUM   --   --  9.0 8.8*  --  8.6* 8.5* 8.9  MG 2.1  --  2.0  --   --   --   --   --    < > = values in this interval not displayed.   GFR Estimated Creatinine Clearance: 129 mL/min (by C-G formula based on SCr of 0.69 mg/dL). Liver Function Tests: Recent Labs  Lab 01/25/24 0511  AST 19  ALT 20  ALKPHOS 44  BILITOT 0.7  PROT 5.9*  ALBUMIN 3.3*   Recent Labs  Lab 01/25/24 0511  LIPASE 37   No results for input(s): AMMONIA in the last 168 hours. Coagulation profile No results for input(s): INR, PROTIME in the last 168 hours. COVID-19 Labs  No results for input(s): DDIMER, FERRITIN, LDH, CRP in the last 72 hours.  Lab Results  Component Value Date   SARSCOV2NAA RESULT: NEGATIVE 08/24/2019   SARSCOV2NAA Not Detected 05/11/2019   SARSCOV2NAA Not Detected 01/17/2019    CBC: Recent Labs  Lab 01/25/24 0511 01/26/24 1645 01/27/24 0234 01/28/24 0302 01/29/24 0244 01/30/24 0304 01/31/24 0302  WBC 6.1  --  6.2 5.8 6.0 6.4 6.8  NEUTROABS 3.6  --   --   --   --   --   --   HGB 14.3   < > 12.9* 12.8* 12.9* 12.8* 13.1  HCT 42.8   < > 39.6 39.2 39.8 39.9 41.3  MCV 89.4  --  89.8 90.5 91.3 90.7 92.0  PLT 225  --  219 196 215 225 213   < > = values in this interval  not displayed.   Cardiac Enzymes: No results for input(s): CKTOTAL, CKMB, CKMBINDEX, TROPONINI in the last 168 hours. BNP (last 3 results) No results for input(s): PROBNP in the last 8760 hours. CBG: Recent Labs  Lab 01/30/24 0624 01/30/24 1112 01/30/24 1601 01/30/24 2117 01/31/24 0602  GLUCAP 140* 163* 159* 221* 150*  D-Dimer: No results for input(s): DDIMER in the last 72 hours. Hgb A1c: No results for input(s): HGBA1C in the last 72 hours.  Lipid Profile: No results for input(s): CHOL, HDL, LDLCALC, TRIG, CHOLHDL, LDLDIRECT in the last 72 hours.  Thyroid  function studies: No results for input(s): TSH, T4TOTAL, T3FREE, THYROIDAB in the last 72 hours.  Invalid input(s): FREET3 Anemia work up: No results for input(s): VITAMINB12, FOLATE, FERRITIN, TIBC, IRON, RETICCTPCT in the last 72 hours. Sepsis Labs: Recent Labs  Lab 01/28/24 0302 01/29/24 0244 01/30/24 0304 01/31/24 0302  WBC 5.8 6.0 6.4 6.8   Microbiology No results found for this or any previous visit (from the past 240 hours).   Medications:    amLODipine   2.5 mg Oral Daily   artificial tears   Both Eyes Q8H   aspirin  EC  81 mg Oral Daily   atorvastatin   40 mg Oral Daily   erythromycin   1 Application Both Eyes Q6H   insulin  aspart  0-5 Units Subcutaneous QHS   insulin  aspart  0-9 Units Subcutaneous TID WC   insulin  aspart  3 Units Subcutaneous TID WC   insulin  glargine-yfgn  10 Units Subcutaneous BID   levothyroxine   175 mcg Oral Daily   pantoprazole   40 mg Oral Daily   ramipril   10 mg Oral Daily   sodium chloride  flush  3 mL Intravenous Q12H   Continuous Infusions:  clindamycin  (CLEOCIN ) IV     heparin  1,650 Units/hr (01/30/24 1819)   nitroGLYCERIN  5 mcg/min (01/30/24 1819)      LOS: 4 days   Erle Odell Castor  [facility]  01/31/2024, 6:30 AM

## 2024-01-31 NOTE — Evaluation (Signed)
 Physical Therapy Evaluation Patient Details Name: Colin Ramirez MRN: [id number] DOB: 11-Dec-1965 Today's Date: 01/31/2024  History of Present Illness  58 yo male adm 01/24/24 with chest pain, [place] . 7/31 R/LHC. 8/5 dental extractions planned in preparation for CABG. PMhx: HTN, HLD, T2DM, sleep apnea, bil THA, CAD  Clinical Impression  Pt very pleasant and reports normally being independent, driving, walking miles and requiring no physical assist or AD. Pt states 8/2 started having bil hip and groin pain which has limited his ability to be in bed, sleep, walk and get comfortable in chair despite pain medicine regimen and mobility. Educated for all pillow and positioning options in bed and recliner with pt stating he as tried them all without success. Pt can only be comfortable for short periods in 90/90 hip/knee flexion sitting position. Pt with hip ROM limited by pain and educated for standing hip abduct/extension exercises and walking as well as potential of different topical pain medication to discuss with MD. Pt educated for sternal precautions in preparation for CABG and will need reinforcement to manage bed mobility. Will follow from a distance for hip pain until post sx then reassess function. Anticipate OPPT if hip pain continues at D/C.       If plan is discharge home, recommend the following: Assistance with cooking/housework;Assist for transportation   Can travel by private vehicle        Equipment Recommendations None recommended by PT  Recommendations for Other Services       Functional Status Assessment Patient has had a recent decline in their functional status and/or demonstrates limited ability to make significant improvements in function in a reasonable and predictable amount of time     Precautions / Restrictions Precautions Precautions: None      Mobility  Bed Mobility Overal bed mobility: Independent                  Transfers Overall transfer level:  Independent                      Ambulation/Gait Ambulation/Gait assistance: Independent Gait Distance (Feet): 50 Feet Assistive device: IV Pole Gait Pattern/deviations: WFL(Within Functional Limits)   Gait velocity interpretation: 1.31 - 2.62 ft/sec, indicative of limited community ambulator   General Gait Details: HR 54-62  Stairs            Wheelchair Mobility     Tilt Bed    Modified Rankin (Stroke Patients Only)       Balance Overall balance assessment: No apparent balance deficits (not formally assessed)                                           Pertinent Vitals/Pain Pain Assessment Pain Assessment: 0-10 Pain Score: 9  Pain Location: bil hips with walking and prolonged standing, 3/10 sitting in chair or EOB Pain Descriptors / Indicators: Aching, Constant Pain Intervention(s): Limited activity within patient's tolerance, Repositioned, Monitored during session, Premedicated before session    Home Living Family/patient expects to be discharged to:: Private residence Living Arrangements: Spouse/significant other Available Help at Discharge: Family;Available PRN/intermittently Type of Home: House Home Access: Stairs to enter   Entrance Stairs-Number of Steps: 2   Home Layout: One level Home Equipment: Agricultural consultant (2 wheels);Crutches;Cane - single point;BSC/3in1      Prior Function Prior Level of Function : Independent/Modified Independent;Driving  Mobility Comments: walks without AD, sleeps on couch       Extremity/Trunk Assessment   Upper Extremity Assessment Upper Extremity Assessment: Overall WFL for tasks assessed    Lower Extremity Assessment Lower Extremity Assessment: RLE deficits/detail;LLE deficits/detail RLE Deficits / Details: hip abduction limited to grossly 20 degrees, hip extension grossly 15 degrees LLE Deficits / Details: hip abduction limited to grossly 20 degrees, hip extension  grossly 15 degrees    Cervical / Trunk Assessment Cervical / Trunk Assessment: Normal  Communication   Communication Communication: No apparent difficulties    Cognition Arousal: Alert Behavior During Therapy: WFL for tasks assessed/performed   PT - Cognitive impairments: No apparent impairments                         Following commands: Intact       Cueing Cueing Techniques: Verbal cues     General Comments      Exercises     Assessment/Plan    PT Assessment Patient needs continued PT services  PT Problem List Decreased strength;Decreased range of motion;Decreased activity tolerance       PT Treatment Interventions DME instruction;Gait training;Functional mobility training;Therapeutic exercise;Patient/family education;Stair training;Therapeutic activities    PT Goals (Current goals can be found in the Care Plan section)  Acute Rehab PT Goals Patient Stated Goal: return home, walk and be without hip pain PT Goal Formulation: With patient Time For Goal Achievement: 02/14/24 Potential to Achieve Goals: Good    Frequency Min 1X/week     Co-evaluation               AM-PAC PT 6 Clicks Mobility  Outcome Measure Help needed turning from your back to your side while in a flat bed without using bedrails?: None Help needed moving from lying on your back to sitting on the side of a flat bed without using bedrails?: None Help needed moving to and from a bed to a chair (including a wheelchair)?: None Help needed standing up from a chair using your arms (e.g., wheelchair or bedside chair)?: None Help needed to walk in hospital room?: A Little Help needed climbing 3-5 steps with a railing? : A Little 6 Click Score: 22    End of Session   Activity Tolerance: Patient tolerated treatment well Patient left: in bed;with nursing/sitter in room;with family/visitor present (sitting EOB) Nurse Communication: Mobility status PT Visit Diagnosis: Other  abnormalities of gait and mobility (R26.89);Pain Pain - Right/Left: Right Pain - part of body: Hip    Time: 9061-9041 PT Time Calculation (min) (ACUTE ONLY): 20 min   Charges:   PT Evaluation $PT Eval Low Complexity: 1 Low   PT General Charges $$ ACUTE PT VISIT: 1 Visit         Lenoard SQUIBB, PT Acute Rehabilitation Services Office: [phone]   Lenoard NOVAK Pius Byrom 01/31/2024, 10:17 AM

## 2024-01-31 NOTE — Anesthesia Preprocedure Evaluation (Addendum)
 Anesthesia Evaluation  Patient identified by MRN, date of birth, ID band Patient awake    Reviewed: Allergy & Precautions, NPO status , Patient's Chart, lab work & pertinent test results  Airway Mallampati: II  TM Distance: >3 FB Neck ROM: Full    Dental   Pulmonary sleep apnea and Continuous Positive Airway Pressure Ventilation , Current Smoker and Patient abstained from smoking.   Pulmonary exam normal        Cardiovascular hypertension, Pt. on medications + angina  + CAD  Normal cardiovascular exam+ Valvular Problems/Murmurs AS   ECHO: 1. Left ventricular ejection fraction, by estimation, is 55 to 60% . The left ventricle has normal function. The left ventricle has no regional wall motion abnormalities. Left ventricular diastolic parameters were normal. 2. Calcified aortic valve. Given age and aortic dilation would be concerned for bicuspid valve. DVI 0. 37. The aortic valve has an indeterminant number of cusps. Aortic valve regurgitation is not visualized. Mild to moderate aortic valve stenosis. Aortic valve area, by VTI measures 1. 62 cm . Aortic valve mean gradient measures 14. 0 mmHg. Aortic valve Vmax measures 2. 48 m/ s. 3. Right ventricular systolic function is mildly reduced. The right ventricular size is normal. 4. Left atrial size was mildly dilated. 5. The mitral valve is normal in structure. No evidence of mitral valve regurgitation. No evidence of mitral stenosis. 6. Aortic dilatation noted. Aneurysm of the ascending aorta, measuring 51 mm. 7. The inferior vena cava is normal in size with greater than 50% respiratory variability, suggesting right atrial pressure of 3 mmHg.   Neuro/Psych    GI/Hepatic Neg liver ROS, hiatal hernia,GERD  Medicated and Controlled,,  Endo/Other  diabetesHypothyroidism    Renal/GU negative Renal ROS     Musculoskeletal   Abdominal  (+) + obese  Peds  Hematology negative hematology  ROS (+)   Anesthesia Other Findings Non-restorable tooth  Reproductive/Obstetrics                              Anesthesia Physical Anesthesia Plan  ASA: 4  Anesthesia Plan: MAC   Post-op Pain Management:    Induction:   PONV Risk Score and Plan: 0 and Ondansetron , Dexamethasone , Propofol  infusion, Midazolam  and Treatment may vary due to age or medical condition  Airway Management Planned: Nasal Cannula  Additional Equipment:   Intra-op Plan:   Post-operative Plan:   Informed Consent: I have reviewed the patients History and Physical, chart, labs and discussed the procedure including the risks, benefits and alternatives for the proposed anesthesia with the patient or authorized representative who has indicated his/her understanding and acceptance.     Dental advisory given  Plan Discussed with: CRNA and Surgeon  Anesthesia Plan Comments:          Anesthesia Quick Evaluation
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
## 2024-01-31 NOTE — Transfer of Care (Signed)
 Immediate Anesthesia Transfer of Care Note  Patient: Colin Ramirez  Procedure(s) Performed: DENTAL RESTORATION/EXTRACTIONS  Patient Location: PACU  Anesthesia Type:MAC  Level of Consciousness: awake, alert , and oriented  Airway & Oxygen Therapy: Patient Spontanous Breathing and Patient connected to nasal cannula oxygen  Post-op Assessment: Report given to RN and Post -op Vital signs reviewed and stable  Post vital signs: Reviewed and stable  Last Vitals:  Vitals Value Taken Time  BP 138/78 01/31/24 14:55  Temp    Pulse 56 01/31/24 14:57  Resp 17 01/31/24 14:57  SpO2 95 % 01/31/24 14:57  Vitals shown include unfiled device data.  Last Pain:  Vitals:   01/31/24 1335  TempSrc:   PainSc: 0-No pain      Patients Stated Pain Goal: 0 (01/28/24 0700)  Complications: No notable events documented.

## 2024-01-31 NOTE — Progress Notes (Signed)
 Mobility Specialist Progress Note:    01/31/24 1104  Mobility  Activity Ambulated independently  Level of Assistance Standby assist, set-up cues, supervision of patient - no hands on  Assistive Device None  Distance Ambulated (ft) 500 ft  Activity Response Tolerated well  Mobility Referral Yes  Mobility visit 1 Mobility  Mobility Specialist Start Time (ACUTE ONLY) 1104  Mobility Specialist Stop Time (ACUTE ONLY) 1112  Mobility Specialist Time Calculation (min) (ACUTE ONLY) 8 min   Pt pleasant and agreeable to session. No c/o any symptoms. Nervous about procedure this afternoon but still in good spirits. Left pt in recliner w/ all needs met.   Venetia Keel Mobility Specialist Please Neurosurgeon or Rehab Office at [phone]

## 2024-01-31 NOTE — Anesthesia Postprocedure Evaluation (Signed)
 Anesthesia Post Note  Patient: Colin Ramirez  Procedure(s) Performed: DENTAL RESTORATION/EXTRACTIONS     Patient location during evaluation: PACU Anesthesia Type: MAC Level of consciousness: awake Pain management: pain level controlled Vital Signs Assessment: post-procedure vital signs reviewed and stable Respiratory status: spontaneous breathing, nonlabored ventilation and respiratory function stable Cardiovascular status: blood pressure returned to baseline and stable Postop Assessment: no apparent nausea or vomiting Anesthetic complications: no   No notable events documented.  Last Vitals:  Vitals:   01/31/24 1500 01/31/24 1511  BP: 134/77 131/80  Pulse: (!) 55 (!) 53  Resp: 14 15  Temp:  36.5 C  SpO2: 95% 92%    Last Pain:  Vitals:   01/31/24 1511  TempSrc:   PainSc: 0-No pain                 Lakeysha Slutsky P Marvelene Stoneberg

## 2024-02-01 ENCOUNTER — Encounter (HOSPITAL_COMMUNITY): Payer: Self-pay | Admitting: Oral Surgery

## 2024-02-01 ENCOUNTER — Inpatient Hospital Stay (HOSPITAL_COMMUNITY): Payer: PRIVATE HEALTH INSURANCE

## 2024-02-01 DIAGNOSIS — I200 Unstable angina: Secondary | ICD-10-CM | POA: Diagnosis not present

## 2024-02-01 DIAGNOSIS — R0789 Other chest pain: Secondary | ICD-10-CM | POA: Diagnosis not present

## 2024-02-01 LAB — BASIC METABOLIC PANEL WITH GFR
Anion gap: 9 (ref 5–15)
BUN: 16 mg/dL (ref 6–20)
CO2: 25 mmol/L (ref 22–32)
Calcium: 9.3 mg/dL (ref 8.9–10.3)
Chloride: 103 mmol/L (ref 98–111)
Creatinine, Ser: 0.81 mg/dL (ref 0.61–1.24)
GFR, Estimated: >60 mL/min (ref >60)
Glucose, Bld: 166 mg/dL — ABNORMAL HIGH (ref 70–99)
Potassium: 3.9 mmol/L (ref 3.5–5.1)
Sodium: 137 mmol/L (ref 135–145)

## 2024-02-01 LAB — CBC
HCT: 45.3 % (ref 39.0–52.0)
Hemoglobin: 14.5 g/dL (ref 13.0–17.0)
MCH: 29.1 pg (ref 26.0–34.0)
MCHC: 32 g/dL (ref 30.0–36.0)
MCV: 91 fL (ref 80.0–100.0)
Platelets: 232 K/uL (ref 150–400)
RBC: 4.98 MIL/uL (ref 4.22–5.81)
RDW: 16.3 % — ABNORMAL HIGH (ref 11.5–15.5)
WBC: 6.5 K/uL (ref 4.0–10.5)
nRBC: 0 % (ref 0.0–0.2)

## 2024-02-01 LAB — GLUCOSE, CAPILLARY
Glucose-Capillary: 122 mg/dL — ABNORMAL HIGH (ref 70–99)
Glucose-Capillary: 161 mg/dL — ABNORMAL HIGH (ref 70–99)
Glucose-Capillary: 168 mg/dL — ABNORMAL HIGH (ref 70–99)
Glucose-Capillary: 217 mg/dL — ABNORMAL HIGH (ref 70–99)

## 2024-02-01 LAB — HEPARIN LEVEL (UNFRACTIONATED): Heparin Unfractionated: 0.22 [IU]/mL — ABNORMAL LOW (ref 0.30–0.70)

## 2024-02-01 MED ORDER — LIDOCAINE 5 % EX PTCH
3.0000 | MEDICATED_PATCH | CUTANEOUS | Status: DC
  Administered 2024-02-01 – 2024-02-02 (×2): 3 via TRANSDERMAL
  Filled 2024-02-01 (×6): qty 3

## 2024-02-01 MED ORDER — GABAPENTIN 100 MG PO CAPS
100.0000 mg | ORAL_CAPSULE | Freq: Three times a day (TID) | ORAL | Status: DC
  Administered 2024-02-01 (×3): 100 mg via ORAL
  Filled 2024-02-01 (×4): qty 1

## 2024-02-01 NOTE — Progress Notes (Signed)
   02/01/24 2200  BiPAP/CPAP/SIPAP  $ Non-Invasive Home Ventilator  Subsequent  Patient Home Machine Yes  Safety Check Completed by RT for Home Unit Yes, no issues noted  Patient Home Mask Yes  Patient Home Tubing Yes  CPAP/SIPAP surface wiped down Yes  Device Plugged into RED Power Outlet Yes

## 2024-02-01 NOTE — Op Note (Signed)
 NAMEWELDON, NOURI MEDICAL RECORD NO: [id number] ACCOUNT NO: [id number] DATE OF BIRTH: [birth year] FACILITY: [facility] LOCATION: [facility] PHYSICIAN: Glendia EMERSON Primrose, DDS  Operative Report   DATE OF PROCEDURE: 01/31/2024   PREOPERATIVE DIAGNOSIS: Nonrestorable tooth #18 secondary to dental caries.  POSTOPERATIVE DIAGNOSIS: Nonrestorable tooth #18 secondary to dental caries.  PROCEDURE: Extraction of tooth #18.  SURGEON: Glendia EMERSON Primrose, DDS  ANESTHESIA: MAC and local anesthesia.   ATTENDING: [place].  DESCRIPTION OF PROCEDURE: The patient was placed on the table. Sedation was given. A bite block was placed in the patient's mouth. A throat screen was placed with sterile sponge. Local anesthesia 2% with 1:100,000 epinephrine  was infiltrated in an  inferior alveolar block on the left side. After allowing time for anesthesia to take effect, a #15 blade was used to make an incision in the sulcus of the gingiva around the tooth. The periosteum was reflected. The tooth was elevated but could not be  luxated. The tooth was sectioned with a Stryker handpiece and fissure bur under irrigation. The roots were removed individually with a 301 elevator. The socket was curetted, irrigated, and Gelfoam sponges were placed, and the area was sutured with 3-0  chromic. The patient was left in the care of anesthesia for transport to recovery and back to the floor.  ESTIMATED BLOOD LOSS: Minimal.  COMPLICATIONS: None.  SPECIMENS: None.  COUNTS: Correct.    SUJ D: 01/31/2024 2:48:58 pm T: 02/01/2024 2:23:00 am  JOB: [id number]/ [id number]

## 2024-02-01 NOTE — Progress Notes (Addendum)
  Progress Note  Patient Name: Colin Ramirez Date of Encounter: 02/01/2024 [facility] Cardiologist: MARALEE ELSPETH BROCKS, MD   Interval Summary   Patient feeling well this AM. No chest pain. Hip pain resolved with gabapentin  (ordered by [facility]). Able to sleep.   Vital Signs Vitals:   01/31/24 2017 02/01/24 0036 02/01/24 0443 02/01/24 0734  BP: (!) 174/93 133/82 127/80 126/87  Pulse: 64 63 (!) 58 (!) 55  Resp: 16 16 16 20   Temp: 97.7 F (36.5 C) 98.6 F (37 C) 98.1 F (36.7 C) 98 F (36.7 C)  TempSrc: Oral Oral Oral Oral  SpO2: 98% 95% 93% 94%  Weight:   112 kg   Height:        Intake/Output Summary (Last 24 hours) at 02/01/2024 0926 Last data filed at 02/01/2024 0448 Gross per 24 hour  Intake 438.63 ml  Output 927 ml  Net -488.37 ml      02/01/2024    4:43 AM 01/31/2024    1:21 PM 01/31/2024    4:30 AM  Last 3 Weights  Weight (lbs) 246 lb 14.4 oz 247 lb 247 lb 8 oz  Weight (kg) 111.993 kg 112.038 kg 112.265 kg      Telemetry/ECG  NSR with occasional nonconducted PACs - Personally Reviewed  Physical Exam  GEN: No acute distress.  Laying in the bed, asleep but aroused easily to voice  Neck: No JVD Cardiac:  RRR. Faint systolic murmur at RUSB  Respiratory: Clear to auscultation bilaterally. Normal WOB. Wearing CPAP  GI: Soft, nontender, non-distended  MS: No edema in BLE   Assessment & Plan  Unstable Angina CAD  - Cath this admission showed severe mid LAD stenosis,severe proximal circumflex stenosis, severe stenosis in the distal OM, moderate mid-distal RCA stenosis - Case was reviewed with the IC team - plan for CABG + Bentall procedure. CT surgery working on timing, currently scheduled for 8/11. Underwent dental extractions yesterday  - No chest pain today  - Continue IV heparin   - Continue ASA 81 mg daily  - Continue amlodipine  2.5 mg daily - Continue lipitor 40 mg daily    Ascending thoracic aneurysm  Mild-moderate AS  - CTA this admission with  aneurysmal dilatation of the ascending thoracic aorta measuring 4.9 cm  - Pending Bentall procedure as above    Tobacco use  - Patient has been counseled on tobacco cessation    HLD  - Lipid panel this admission with LDL 46  - Continue lipitor 40 mg daily    HTN  - Continue amlodipine  2.5 mg daily   For questions or updates, please contact [facility] Please consult [email or web address] for contact info under       Signed, Rollo FABIENE Louder, PA-C   Patient seen, examined. Available data reviewed. Agree with findings, assessment, and plan as outlined by Rollo Louder, PA-C.  The patient is independently interviewed and examined.  He is alert, oriented, no distress.  Exam is unchanged with heart regular rate and rhythm with a 2/6 murmur at the left lower sternal border, lungs clear, abdomen soft and nontender, extremities without edema.  He has had no chest pain or pressure overnight.  He remains on IV heparin  and nitroglycerin .  Continue current management.  His hip pain is much better since starting Neurontin .  Appreciate hospitalist management.  Pending cardiac surgery next week.  Ozell Fell, M.D. 02/01/2024 11:36 AM

## 2024-02-01 NOTE — Plan of Care (Signed)
   Problem: Clinical Measurements: Goal: Ability to maintain clinical measurements within normal limits will improve Outcome: Progressing   Problem: Clinical Measurements: Goal: Will remain free from infection Outcome: Progressing   Problem: Clinical Measurements: Goal: Diagnostic test results will improve Outcome: Progressing

## 2024-02-01 NOTE — Progress Notes (Signed)
 Mobility Specialist Progress Note:    02/01/24 1005  Mobility  Activity Ambulated independently  Level of Assistance Independent  Assistive Device None  Distance Ambulated (ft) 200 ft  Activity Response Tolerated well  Mobility Referral Yes  Mobility visit 1 Mobility  Mobility Specialist Start Time (ACUTE ONLY) 1005  Mobility Specialist Stop Time (ACUTE ONLY) 1013  Mobility Specialist Time Calculation (min) (ACUTE ONLY) 8 min   Pt agreeable to session. Received pt ambulating in room. No c/o any symptoms. Feeling better since received medication for nerves. Left pt ambulating in room and in recliner w/ all needs met.  Venetia Keel Mobility Specialist Please Neurosurgeon or Rehab Office at [phone]

## 2024-02-01 NOTE — Progress Notes (Signed)
 OT Cancellation Note  Patient Details Name: Colin Ramirez MRN: [id number] DOB: [birth year]   Cancelled Treatment:    Reason Eval/Treat Not Completed: OT screened, no needs identified, will sign off. Per PT note, pt currently Ind with ADLs, mobilizing without AD. Currently scheduled for CABG 8/11, OT can be re-consulted post-op. OT is signing off on this pt.    Zenobia Kuennen C, OT  Acute Rehabilitation Services Office [phone] Secure chat preferred   Colin Ramirez 02/01/2024, 7:01 AM

## 2024-02-01 NOTE — Plan of Care (Signed)
  Problem: Health Behavior/Discharge Planning: Goal: Ability to manage health-related needs will improve Outcome: Progressing   Problem: Clinical Measurements: Goal: Ability to maintain clinical measurements within normal limits will improve Outcome: Progressing   Problem: Pain Managment: Goal: General experience of comfort will improve and/or be controlled Outcome: Progressing   Problem: Metabolic: Goal: Ability to maintain appropriate glucose levels will improve Outcome: Progressing   Problem: Activity: Goal: Ability to return to baseline activity level will improve Outcome: Progressing
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
## 2024-02-01 NOTE — Progress Notes (Addendum)
 PROGRESS NOTE    Colin Ramirez  FMW:[id number] DOB: 23-Aug-1965 DOA: 01/24/2024 PCP: [name], MD (Inactive)  Chief Complaint  Patient presents with   Chest Pain    Brief Narrative:   Colin Ramirez is an 58 y.o. male past medical history significant for coronary artery disease, with Colin Ramirez left heart cath on 10/28/2017 that showed two-vessel disease with moderate stenosis, diabetes mellitus type 2, essential hypertension admitted to [facility] when he started having chest pain intermittent nonradiating that started about 6 days prior to admission not associated with shortness of breath or palpitation he does relate recent nausea and vomiting.    Now s/p LHC with multivessel disease.  Plan is for CABG and Bentall procedure.   Assessment & Plan:   Principal Problem:   Atypical chest pain Active Problems:   Primary hypertension   HLD (hyperlipidemia)   GERD (gastroesophageal reflux disease)   DM2 (diabetes mellitus, type 2) (HCC)   Hypokalemia   Unstable angina (HCC)   Aneurysm of ascending aorta without rupture (HCC)   Aortic stenosis with bicuspid valve  Unstable angina Mild to moderate AS  Ascending Aortic Aneurysm Continue IV heparin , aspirin , statins.   Left heart cath showed multivessel disease - plan is for CABG and Bentall surgery.  CT angio that showed ascending aortic aneurysm measuring 4.9 cm  S/p extraction tooth #18 on 8/5 Plan for surgery 8/11 Currently on nitroglycerin  gtt  Further management per CV surgery and cardiology   Bilateral hip pain  Sciatica:  Describes burning that radiates from hips into groin and down to knees, no TTP to either hip on exam, + SLR.  Suspect this represents sciatica rather than pain from hips.  No numbness, no bowel/bladder incontinence. Will get plain films and lumbar spine film Trial gabapentin   Could consider short steroid course, but want to avoid this prior to surgery and with his diabetes - will  reevaluate  Uncontrolled Diabetes mellitus type 2: Hemoglobin A1c was 7.4 Basal, bolus, SSI Holding all oral hypoglycemic agents.   Pinkeye unilaterally: Started on eyedrops, and erythromycin  ointment.   Essential hypertension: HCTZ was held on admission Amlodipine  (lower dose) ramipril  Coreg  on hold   Hyperlipidemia: Continue statins.   GERD: Continue PPI   Hypothyroidism Synthroid    Chronic Pain On oral dilaudid  at home for chronic back pain Continue, will add lidocaine  patch  IV fentanyl  ordered for breakthrough   AKI: Resolved  Hypokalemia: resolved.  Morbid obesity with Colin Ramirez BMI greater than 30: Body mass index is 32.57 kg/m.    DVT prophylaxis: heparin  gtt Code Status: full Family Communication: none Disposition:   Status is: Inpatient Remains inpatient appropriate because: need for continued inpatient care   Consultants:  Cardiology Dental  CT surgery  Procedures:  Echo 7/30  IMPRESSIONS     1. Left ventricular ejection fraction, by estimation, is 55 to 60%. The  left ventricle has normal function. The left ventricle has no regional  wall motion abnormalities. Left ventricular diastolic parameters were  normal.   2. Calcified aortic valve. Given age and aortic dilation would be  concerned for bicuspid valve. DVI 0.37. The aortic valve has an  indeterminant number of cusps. Aortic valve regurgitation is not  visualized. Mild to moderate aortic valve stenosis. Aortic   valve area, by VTI measures 1.62 cm. Aortic valve mean gradient measures  14.0 mmHg. Aortic valve Vmax measures 2.48 m/s.   3. Right ventricular systolic function is mildly reduced. The right  ventricular  size is normal.   4. Left atrial size was mildly dilated.   5. The mitral valve is normal in structure. No evidence of mitral valve  regurgitation. No evidence of mitral stenosis.   6. Aortic dilatation noted. Aneurysm of the ascending aorta, measuring 51  mm.   7. The  inferior vena cava is normal in size with greater than 50%  respiratory variability, suggesting right atrial pressure of 3 mmHg.   LHC 7/31  Prox LAD to Mid LAD lesion is 75% stenosed.   Ost 1st Diag to 1st Diag lesion is 30% stenosed.   Ost 2nd Diag to 2nd Diag lesion is 30% stenosed.   Mid RCA to Dist RCA lesion is 50% stenosed.   Prox RCA lesion is 40% stenosed.   RPDA lesion is 90% stenosed.   RPAV lesion is 70% stenosed.   Dist RCA lesion is 50% stenosed.   Prox Cx lesion is 90% stenosed.   Ost 3rd Mrg lesion is 90% stenosed.   Prox LAD lesion is 70% stenosed.   Severe mid LAD stenosis Severe proximal Circumflex stenosis and severe stenosis involving the most distal obtuse marginal branch.  Large dominant RCA with moderate mid and distal stenosis. Severe stenosis in the moderate caliber posterolateral artery and severe stenosis in the moderate caliber PDA.  Mild elevation of right and left heart pressures Mild to moderate aortic stenosis (14 mm Hg peak to peak gradient, 16 mmHg mean gradient).   Recommendations: Will review cath images with the IC team tomorrow. Given multi-vessel CAD, dilated aortic root and moderate aortic stenosis, will need to consider CT surgery consult for Bentall procedure + CABG. We will call the CT surgery team tomorrow given the late timing of the case being finished tonight.   PreCabg US  Summary:  Right Carotid: Velocities in the right ICA are consistent with Temari Schooler 1-39%  stenosis.   Left Carotid: Velocities in the left ICA are consistent with Dennisse Swader 1-39%  stenosis.  Vertebrals: Bilateral vertebral arteries demonstrate antegrade flow.  Subclavians: Normal flow hemodynamics were seen in bilateral subclavian               arteries.   Right ABI: Resting right ankle-brachial index is within normal range.  Left ABI: Resting left ankle-brachial index is within normal range.  Right Upper Extremity: Doppler waveforms remain within normal limits with  right  radial compression. Doppler waveforms remain within normal limits  with right ulnar compression.  Left Upper Extremity: Doppler waveforms remain within normal limits with  left radial compression. Doppler waveforms remain within normal limits  with left ulnar compression.     8/5 EXTRACTION tooth # 18.   Antimicrobials:  Anti-infectives (From admission, onward)    Start     Dose/Rate Route Frequency Ordered Stop   01/31/24 1345  clindamycin  (CLEOCIN ) IVPB 600 mg        600 mg 100 mL/hr over 30 Minutes Intravenous To [facility] 01/30/24 1948 01/31/24 1433       Subjective: C/o hip pain since admission  Objective: Vitals:   01/31/24 2017 02/01/24 0036 02/01/24 0443 02/01/24 0734  BP: (!) 174/93 133/82 127/80 126/87  Pulse: 64 63 (!) 58 (!) 55  Resp: 16 16 16 20   Temp: 97.7 F (36.5 C) 98.6 F (37 C) 98.1 F (36.7 C) 98 F (36.7 C)  TempSrc: Oral Oral Oral Oral  SpO2: 98% 95% 93% 94%  Weight:   112 kg   Height:  Intake/Output Summary (Last 24 hours) at 02/01/2024 0820 Last data filed at 02/01/2024 0448 Gross per 24 hour  Intake 438.63 ml  Output 927 ml  Net -488.37 ml   Filed Weights   01/31/24 0430 01/31/24 1321 02/01/24 0443  Weight: 112.3 kg 112 kg 112 kg    Examination:  General exam: Appears calm and comfortable  Respiratory system: Clear to auscultation. Respiratory effort normal. Cardiovascular system: RRR MSK: no TTP to bilateral hips, chronic back pain  Central nervous system: Alert and oriented. No focal neurological deficits. + SLR bilaterally. Extremities: 1+ LE edema  Data Reviewed: I have personally reviewed following labs and imaging studies  CBC: Recent Labs  Lab 01/27/24 0234 01/28/24 0302 01/29/24 0244 01/30/24 0304 01/31/24 0302  WBC 6.2 5.8 6.0 6.4 6.8  HGB 12.9* 12.8* 12.9* 12.8* 13.1  HCT 39.6 39.2 39.8 39.9 41.3  MCV 89.8 90.5 91.3 90.7 92.0  PLT 219 196 215 225 213    Basic Metabolic Panel: Recent Labs  Lab  01/26/24 1014 01/26/24 1645 01/27/24 0234 01/28/24 0302 01/30/24 0859  NA 137 140  135 138 140 139  K 3.6 3.4*  2.2* 3.5 3.7 3.8  CL 103  --  102 107 105  CO2 25  --  27 25 25   GLUCOSE 143*  --  166* 202* 194*  BUN 13  --  15 16 12   CREATININE 0.66  --  0.72 0.68 0.69  CALCIUM  8.8*  --  8.6* 8.5* 8.9    GFR: Estimated Creatinine Clearance: 128.8 mL/min (by C-G formula based on SCr of 0.69 mg/dL).  Liver Function Tests: No results for input(s): AST, ALT, ALKPHOS, BILITOT, PROT, ALBUMIN in the last 168 hours.  CBG: Recent Labs  Lab 01/31/24 1403 01/31/24 1455 01/31/24 1647 01/31/24 2121 02/01/24 0612  GLUCAP 108* 146* 150* 191* 122*     No results found for this or any previous visit (from the past 240 hours).       Radiology Studies: No results found.      Scheduled Meds:  amLODipine   2.5 mg Oral Daily   artificial tears   Both Eyes Q8H   aspirin  EC  81 mg Oral Daily   atorvastatin   40 mg Oral Daily   erythromycin   1 Application Both Eyes Q6H   gabapentin   100 mg Oral TID   insulin  aspart  0-5 Units Subcutaneous QHS   insulin  aspart  0-9 Units Subcutaneous TID WC   insulin  aspart  3 Units Subcutaneous TID WC   insulin  glargine-yfgn  10 Units Subcutaneous BID   levothyroxine   175 mcg Oral Daily   lidocaine   3 patch Transdermal Q24H   pantoprazole   40 mg Oral Daily   ramipril   10 mg Oral Daily   sodium chloride  flush  3 mL Intravenous Q12H   Continuous Infusions:  heparin  1,650 Units/hr (01/31/24 2120)   nitroGLYCERIN  5 mcg/min (01/30/24 1819)     LOS: 5 days    Time spent: over 30 min    Meliton Monte, MD [facility]   To contact the attending provider between 7A-7P or the covering provider during after hours 7P-7A, please log into the web site [email or web address] and access using universal [place] password for that web site. If you do not have the password, please call the hospital operator.  02/01/2024, 8:20 AM

## 2024-02-02 DIAGNOSIS — I200 Unstable angina: Secondary | ICD-10-CM | POA: Diagnosis not present

## 2024-02-02 DIAGNOSIS — R0789 Other chest pain: Secondary | ICD-10-CM | POA: Diagnosis not present

## 2024-02-02 LAB — CBC
HCT: 40.2 % (ref 39.0–52.0)
Hemoglobin: 13.1 g/dL (ref 13.0–17.0)
MCH: 29.5 pg (ref 26.0–34.0)
MCHC: 32.6 g/dL (ref 30.0–36.0)
MCV: 90.5 fL (ref 80.0–100.0)
Platelets: 233 K/uL (ref 150–400)
RBC: 4.44 MIL/uL (ref 4.22–5.81)
RDW: 16 % — ABNORMAL HIGH (ref 11.5–15.5)
WBC: 5.5 K/uL (ref 4.0–10.5)
nRBC: 0 % (ref 0.0–0.2)

## 2024-02-02 LAB — BASIC METABOLIC PANEL WITH GFR
Anion gap: 10 (ref 5–15)
BUN: 17 mg/dL (ref 6–20)
CO2: 25 mmol/L (ref 22–32)
Calcium: 8.7 mg/dL — ABNORMAL LOW (ref 8.9–10.3)
Chloride: 102 mmol/L (ref 98–111)
Creatinine, Ser: 0.96 mg/dL (ref 0.61–1.24)
GFR, Estimated: >60 mL/min (ref >60)
Glucose, Bld: 200 mg/dL — ABNORMAL HIGH (ref 70–99)
Potassium: 3.9 mmol/L (ref 3.5–5.1)
Sodium: 137 mmol/L (ref 135–145)

## 2024-02-02 LAB — HEPARIN LEVEL (UNFRACTIONATED)
Heparin Unfractionated: 0.37 [IU]/mL (ref 0.30–0.70)
Heparin Unfractionated: 0.51 [IU]/mL (ref 0.30–0.70)

## 2024-02-02 LAB — GLUCOSE, CAPILLARY
Glucose-Capillary: 189 mg/dL — ABNORMAL HIGH (ref 70–99)
Glucose-Capillary: 159 mg/dL — ABNORMAL HIGH (ref 70–99)
Glucose-Capillary: 225 mg/dL — ABNORMAL HIGH (ref 70–99)
Glucose-Capillary: 228 mg/dL — ABNORMAL HIGH (ref 70–99)

## 2024-02-02 MED ORDER — GABAPENTIN 300 MG PO CAPS
300.0000 mg | ORAL_CAPSULE | Freq: Every day | ORAL | Status: DC
  Administered 2024-02-02 – 2024-02-11 (×13): 300 mg via ORAL
  Filled 2024-02-02 (×10): qty 1

## 2024-02-02 MED ORDER — GABAPENTIN 100 MG PO CAPS
100.0000 mg | ORAL_CAPSULE | Freq: Two times a day (BID) | ORAL | Status: DC
  Administered 2024-02-02 – 2024-02-12 (×23): 100 mg via ORAL
  Filled 2024-02-02 (×17): qty 1

## 2024-02-02 NOTE — Plan of Care (Signed)
   Problem: Clinical Measurements: Goal: Will remain free from infection Outcome: Progressing   Problem: Clinical Measurements: Goal: Diagnostic test results will improve Outcome: Progressing   Problem: Clinical Measurements: Goal: Respiratory complications will improve Outcome: Progressing

## 2024-02-02 NOTE — Progress Notes (Signed)
 Mobility Specialist Progress Note:    02/02/24 0952  Mobility  Activity Ambulated independently  Level of Assistance Independent  Assistive Device None  Distance Ambulated (ft) 500 ft  Activity Response Tolerated well  Mobility Referral Yes  Mobility visit 1 Mobility  Mobility Specialist Start Time (ACUTE ONLY) [id number]  Mobility Specialist Stop Time (ACUTE ONLY) 1002  Mobility Specialist Time Calculation (min) (ACUTE ONLY) 10 min   Pt pleasant and agreeable to session. No c/o any symptom. Pt feeling better and strong each day. Returned pt to recliner w/ all needs met.   Venetia Keel Mobility Specialist Please Neurosurgeon or Rehab Office at [phone]
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
## 2024-02-02 NOTE — Progress Notes (Signed)
 Pt received OHS book, Move in the Tube sheet, OHS careguide, and incentive spirometer (IS). Pt was educated on approx length of surgery and stay, importance of ambulation and using IS, restrictions, home needs, and CRPII. Pt will be referred to [facility] for outpatient CR.    All questions answered prior to leaving.  Garen FORBES Candy MS, ACSM-CEP  02/02/2024 11:25 AM   [phone]

## 2024-02-02 NOTE — TOC Progression Note (Signed)
 Transition of Care [facility]) - Progression Note    Patient Details  Name: Colin Ramirez MRN: [id number] Date of Birth: 1965-09-24  [facility] CM/SW Contact  Waddell Barnie Rama, RN Phone Number: 02/02/2024, 4:41 PM  Clinical Narrative:    Patient is to have a CABG on the 11th , per pt eval rec outpatient physical therapy, will wait til after surgery to set that up. IP CM to follow.                     Expected Discharge Plan and Services                                               Social Drivers of Health (SDOH) Interventions SDOH Screenings   Food Insecurity: No Food Insecurity (01/26/2024)  Housing: Low Risk  (01/26/2024)  Transportation Needs: No Transportation Needs (01/26/2024)  Utilities: Not At Risk (01/26/2024)  Alcohol Screen: Low Risk  (11/08/2018)  Depression (PHQ2-9): Low Risk  (08/25/2021)  Tobacco Use: High Risk (01/31/2024)    Readmission Risk Interventions     No data to display

## 2024-02-02 NOTE — Plan of Care (Signed)
  Problem: Clinical Measurements: Goal: Ability to maintain clinical measurements within normal limits will improve Outcome: Progressing Goal: Diagnostic test results will improve Outcome: Progressing Goal: Cardiovascular complication will be avoided Outcome: Progressing   Problem: Pain Managment: Goal: General experience of comfort will improve and/or be controlled Outcome: Progressing   Problem: Tissue Perfusion: Goal: Adequacy of tissue perfusion will improve Outcome: Progressing

## 2024-02-02 NOTE — Progress Notes (Signed)
  Progress Note  Patient Name: Colin Ramirez Date of Encounter: 02/02/2024 [facility] Cardiologist: MARALEE ELSPETH BROCKS, MD   Interval Summary   Doing well this morning.  Hip pain is improved.  No chest pain or shortness of breath.  Vital Signs Vitals:   02/01/24 2200 02/02/24 0032 02/02/24 0418 02/02/24 0740  BP:  129/78 (!) 140/80 (!) 151/69  Pulse:  (!) 59 (!) 56 (!) 56  Resp:  16 15 18   Temp:  98.6 F (37 C) 97.6 F (36.4 C) 97.6 F (36.4 C)  TempSrc:  Oral Oral Oral  SpO2: 98% 95% 96% 95%  Weight:   114 kg   Height:        Intake/Output Summary (Last 24 hours) at 02/02/2024 0804 Last data filed at 02/01/2024 1831 Gross per 24 hour  Intake 713.76 ml  Output --  Net 713.76 ml      02/02/2024    4:18 AM 02/01/2024    4:43 AM 01/31/2024    1:21 PM  Last 3 Weights  Weight (lbs) 251 lb 6.4 oz 246 lb 14.4 oz 247 lb  Weight (kg) 114.034 kg 111.993 kg 112.038 kg      Telemetry/ECG  Sinus rhythm with no significant arrhythmia- Personally Reviewed  Physical Exam  GEN: No acute distress.   Neck: No JVD Cardiac: RRR, 2/6 systolic murmur at the LLSB Respiratory: Clear to auscultation bilaterally. GI: Soft, nontender, non-distended  MS: No edema  Assessment & Plan  Unstable angina: on IV heparin  and NTG, symptoms stable awaiting CABG/Bentall early next week.  Bicuspid aortic valve with mild-moderate AS (mean gradient 14 mmHg/AVA 1.6) and ascending aortic aneurysm: AVR/Bentall next week Tobacco abuse: cessation counseling done. Motivated to quit. HTN: controlled. On amlodipine . Hyperlipidemia: on atorvastatin  40 mg, LDL 46.   For questions or updates, please contact [facility] Please consult [email or web address] for contact info under   Signed, Ozell Fell, MD
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
## 2024-02-02 NOTE — Progress Notes (Signed)
 PROGRESS NOTE    Colin Ramirez  FMW:[id number] DOB: 1965-07-22 DOA: 01/24/2024 PCP: Colin Dorothyann Maxwell, MD (Inactive)  Chief Complaint  Patient presents with   Chest Pain    Brief Narrative:   Colin Ramirez is an 58 y.o. male past medical history significant for coronary artery disease, with Colin Ramirez left heart cath on 10/28/2017 that showed two-vessel disease with moderate stenosis, diabetes mellitus type 2, essential hypertension admitted to [facility] when he started having chest pain intermittent nonradiating that started about 6 days prior to admission not associated with shortness of breath or palpitation he does relate recent nausea and vomiting.    Now s/p LHC with multivessel disease.  Plan is for CABG and Bentall procedure.   Assessment & Plan:   Principal Problem:   Atypical chest pain Active Problems:   Primary hypertension   HLD (hyperlipidemia)   GERD (gastroesophageal reflux disease)   DM2 (diabetes mellitus, type 2) (HCC)   Hypokalemia   Unstable angina (HCC)   Aneurysm of ascending aorta without rupture (HCC)   Aortic stenosis with bicuspid valve  Unstable angina Mild to moderate AS  Ascending Aortic Aneurysm Continue IV heparin , aspirin , statins.   Left heart cath showed multivessel disease - plan is for CABG and Bentall surgery.  CT angio that showed ascending aortic aneurysm measuring 4.9 cm  S/p extraction tooth #18 on 8/5 Plan for surgery 8/11 Currently on nitroglycerin  gtt  Further management per CV surgery and cardiology   Bilateral hip pain  Sciatica:  Describes burning that radiates from hips into groin and down to knees, no TTP to either hip on exam, + SLR.  Suspect this represents sciatica rather than pain from hips.  No numbness, no bowel/bladder incontinence. Diffuse degenerative disc disease and facet hypertrophy throughout lumbar spine.  Bilateral hip arthroplasties without complication. Trial gabapentin , this is working  well - increase gabapentin  to 300 mg tonight  Uncontrolled Diabetes mellitus type 2: Hemoglobin A1c was 7.4 Basal, bolus, SSI Holding all oral hypoglycemic agents.   Pinkeye unilaterally Hordeolum  Warm compress Started on eyedrops, and erythromycin  ointment.   Essential hypertension: HCTZ was held on admission Amlodipine  (lower dose) ramipril  Coreg  on hold   Hyperlipidemia: Continue statins.   GERD: Continue PPI   Hypothyroidism Synthroid    Chronic Pain On oral dilaudid  at home for chronic back pain Continue, will add lidocaine  patch  IV fentanyl  ordered for breakthrough   AKI: Resolved  Hypokalemia: resolved.  Morbid obesity with Colin Ramirez BMI greater than 30: Body mass index is 32.57 kg/m.    DVT prophylaxis: heparin  gtt Code Status: full Family Communication: none Disposition:   Status is: Inpatient Remains inpatient appropriate because: need for continued inpatient care   Consultants:  Cardiology Dental  CT surgery  Procedures:  Echo 7/30  IMPRESSIONS     1. Left ventricular ejection fraction, by estimation, is 55 to 60%. The  left ventricle has normal function. The left ventricle has no regional  wall motion abnormalities. Left ventricular diastolic parameters were  normal.   2. Calcified aortic valve. Given age and aortic dilation would be  concerned for bicuspid valve. DVI 0.37. The aortic valve has an  indeterminant number of cusps. Aortic valve regurgitation is not  visualized. Mild to moderate aortic valve stenosis. Aortic   valve area, by VTI measures 1.62 cm. Aortic valve mean gradient measures  14.0 mmHg. Aortic valve Vmax measures 2.48 m/s.   3. Right ventricular systolic function is mildly reduced. The right  ventricular size is normal.   4. Left atrial size was mildly dilated.   5. The mitral valve is normal in structure. No evidence of mitral valve  regurgitation. No evidence of mitral stenosis.   6. Aortic dilatation noted.  Aneurysm of the ascending aorta, measuring 51  mm.   7. The inferior vena cava is normal in size with greater than 50%  respiratory variability, suggesting right atrial pressure of 3 mmHg.   LHC 7/31  Prox LAD to Mid LAD lesion is 75% stenosed.   Ost 1st Diag to 1st Diag lesion is 30% stenosed.   Ost 2nd Diag to 2nd Diag lesion is 30% stenosed.   Mid RCA to Dist RCA lesion is 50% stenosed.   Prox RCA lesion is 40% stenosed.   RPDA lesion is 90% stenosed.   RPAV lesion is 70% stenosed.   Dist RCA lesion is 50% stenosed.   Prox Cx lesion is 90% stenosed.   Ost 3rd Mrg lesion is 90% stenosed.   Prox LAD lesion is 70% stenosed.   Severe mid LAD stenosis Severe proximal Circumflex stenosis and severe stenosis involving the most distal obtuse marginal branch.  Large dominant RCA with moderate mid and distal stenosis. Severe stenosis in the moderate caliber posterolateral artery and severe stenosis in the moderate caliber PDA.  Mild elevation of right and left heart pressures Mild to moderate aortic stenosis (14 mm Hg peak to peak gradient, 16 mmHg mean gradient).   Recommendations: Will review cath images with the IC team tomorrow. Given multi-vessel CAD, dilated aortic root and moderate aortic stenosis, will need to consider CT surgery consult for Bentall procedure + CABG. We will call the CT surgery team tomorrow given the late timing of the case being finished tonight.   PreCabg US  Summary:  Right Carotid: Velocities in the right ICA are consistent with Colin Ramirez 1-39%  stenosis.   Left Carotid: Velocities in the left ICA are consistent with Colin Ramirez 1-39%  stenosis.  Vertebrals: Bilateral vertebral arteries demonstrate antegrade flow.  Subclavians: Normal flow hemodynamics were seen in bilateral subclavian               arteries.   Right ABI: Resting right ankle-brachial index is within normal range.  Left ABI: Resting left ankle-brachial index is within normal range.  Right Upper Extremity:  Doppler waveforms remain within normal limits with  right radial compression. Doppler waveforms remain within normal limits  with right ulnar compression.  Left Upper Extremity: Doppler waveforms remain within normal limits with  left radial compression. Doppler waveforms remain within normal limits  with left ulnar compression.     8/5 EXTRACTION tooth # 18.   Antimicrobials:  Anti-infectives (From admission, onward)    Start     Dose/Rate Route Frequency Ordered Stop   01/31/24 1345  clindamycin  (CLEOCIN ) IVPB 600 mg        600 mg 100 mL/hr over 30 Minutes Intravenous To [facility] 01/30/24 1948 01/31/24 1433       Subjective: Gabapentin  helped with his hip pain   Objective: Vitals:   02/01/24 2200 02/02/24 0032 02/02/24 0418 02/02/24 0740  BP:  129/78 (!) 140/80 (!) 151/69  Pulse:  (!) 59 (!) 56 (!) 56  Resp:  16 15 18   Temp:  98.6 F (37 C) 97.6 F (36.4 C) 97.6 F (36.4 C)  TempSrc:  Oral Oral Oral  SpO2: 98% 95% 96% 95%  Weight:   114 kg   Height:  Intake/Output Summary (Last 24 hours) at 02/02/2024 0841 Last data filed at 02/01/2024 1831 Gross per 24 hour  Intake 713.76 ml  Output --  Net 713.76 ml   Filed Weights   01/31/24 1321 02/01/24 0443 02/02/24 0418  Weight: 112 kg 112 kg 114 kg    Examination:  General: No acute distress. Cardiovascular: Heart sounds show Donnovan Stamour regular rate, and rhythm Lungs: unlabored, CTAB Neurological: Alert and oriented 3. Moves all extremities 4 with equal strength. Cranial nerves II through XII grossly intact. Extremities: !+ LE edema   Data Reviewed: I have personally reviewed following labs and imaging studies  CBC: Recent Labs  Lab 01/29/24 0244 01/30/24 0304 01/31/24 0302 02/01/24 1035 02/01/24 2345  WBC 6.0 6.4 6.8 6.5 5.5  HGB 12.9* 12.8* 13.1 14.5 13.1  HCT 39.8 39.9 41.3 45.3 40.2  MCV 91.3 90.7 92.0 91.0 90.5  PLT 215 225 213 232 233    Basic Metabolic Panel: Recent Labs  Lab  01/27/24 0234 01/28/24 0302 01/30/24 0859 02/01/24 1035 02/01/24 2345  NA 138 140 139 137 137  K 3.5 3.7 3.8 3.9 3.9  CL 102 107 105 103 102  CO2 27 25 25 25 25   GLUCOSE 166* 202* 194* 166* 200*  BUN 15 16 12 16 17   CREATININE 0.72 0.68 0.69 0.81 0.96  CALCIUM  8.6* 8.5* 8.9 9.3 8.7*    GFR: Estimated Creatinine Clearance: 108.2 mL/min (by C-G formula based on SCr of 0.96 mg/dL).  Liver Function Tests: No results for input(s): AST, ALT, ALKPHOS, BILITOT, PROT, ALBUMIN  in the last 168 hours.  CBG: Recent Labs  Lab 02/01/24 0612 02/01/24 1121 02/01/24 1537 02/01/24 2130 02/02/24 0608  GLUCAP 122* 161* 168* 217* 189*     No results found for this or any previous visit (from the past 240 hours).       Radiology Studies: DG Lumbar Spine 2-3 Views Result Date: 02/01/2024 CLINICAL DATA:  Low back pain. EXAM: LUMBAR SPINE - 2-3 VIEW COMPARISON:  None Available. FINDINGS: Five non-rib-bearing lumbar vertebra. Straightening of normal lordosis. No fracture or compression deformity. Moderate diffuse disc space narrowing and spurring throughout the lumbar spine. Multilevel facet hypertrophy. No obvious pars defects or focal bone abnormality. Aortic atherosclerosis. Otherwise unremarkable soft tissues. IMPRESSION: Moderate diffuse degenerative disc disease and facet hypertrophy throughout the lumbar spine. Electronically Signed   By: Andrea Gasman M.D.   On: 02/01/2024 11:45   DG HIP UNILAT WITH PELVIS 2-3 VIEWS LEFT Result Date: 02/01/2024 CLINICAL DATA:  Pain. EXAM: DG HIP (WITH OR WITHOUT PELVIS) 2-3V LEFT; DG HIP (WITH OR WITHOUT PELVIS) 2-3V RIGHT COMPARISON:  Radiographs 04/27/2023 FINDINGS: Right hip: Hip arthroplasty in expected alignment. No periprosthetic lucency. No acute fracture. No erosive change. Left hip: Hip arthroplasty in expected alignment. No periprosthetic lucency. No acute or periprosthetic fracture. No erosive change. Pelvis: No pelvic fracture. No  evidence of focal lesion or bony destructive change. Pubic symphysis and sacroiliac joints are congruent. Unremarkable soft tissues. IMPRESSION: Bilateral hip arthroplasties without complication. No acute findings. Electronically Signed   By: Andrea Gasman M.D.   On: 02/01/2024 11:44   DG HIP UNILAT WITH PELVIS 2-3 VIEWS RIGHT Result Date: 02/01/2024 CLINICAL DATA:  Pain. EXAM: DG HIP (WITH OR WITHOUT PELVIS) 2-3V LEFT; DG HIP (WITH OR WITHOUT PELVIS) 2-3V RIGHT COMPARISON:  Radiographs 04/27/2023 FINDINGS: Right hip: Hip arthroplasty in expected alignment. No periprosthetic lucency. No acute fracture. No erosive change. Left hip: Hip arthroplasty in expected alignment. No periprosthetic lucency. No acute  or periprosthetic fracture. No erosive change. Pelvis: No pelvic fracture. No evidence of focal lesion or bony destructive change. Pubic symphysis and sacroiliac joints are congruent. Unremarkable soft tissues. IMPRESSION: Bilateral hip arthroplasties without complication. No acute findings. Electronically Signed   By: Andrea Gasman M.D.   On: 02/01/2024 11:44        Scheduled Meds:  amLODipine   2.5 mg Oral Daily   artificial tears   Both Eyes Q8H   aspirin  EC  81 mg Oral Daily   atorvastatin   40 mg Oral Daily   gabapentin   100 mg Oral BID WC   And   gabapentin   300 mg Oral QHS   insulin  aspart  0-5 Units Subcutaneous QHS   insulin  aspart  0-9 Units Subcutaneous TID WC   insulin  aspart  3 Units Subcutaneous TID WC   insulin  glargine-yfgn  10 Units Subcutaneous BID   levothyroxine   175 mcg Oral Daily   lidocaine   3 patch Transdermal Q24H   pantoprazole   40 mg Oral Daily   ramipril   10 mg Oral Daily   sodium chloride  flush  3 mL Intravenous Q12H   Continuous Infusions:  heparin  1,850 Units/hr (02/02/24 0129)   nitroGLYCERIN  5 mcg/min (01/30/24 1819)     LOS: 6 days    Time spent: over 30 min    Meliton Monte, MD [facility]   To contact the attending provider  between 7A-7P or the covering provider during after hours 7P-7A, please log into the web site [email or web address] and access using universal Harvey password for that web site. If you do not have the password, please call the hospital operator.  02/02/2024, 8:41 AM

## 2024-02-03 DIAGNOSIS — R0789 Other chest pain: Secondary | ICD-10-CM | POA: Diagnosis not present

## 2024-02-03 DIAGNOSIS — I5031 Acute diastolic (congestive) heart failure: Secondary | ICD-10-CM

## 2024-02-03 LAB — BASIC METABOLIC PANEL WITH GFR
Anion gap: 6 (ref 5–15)
BUN: 16 mg/dL (ref 6–20)
CO2: 28 mmol/L (ref 22–32)
Calcium: 8.6 mg/dL — ABNORMAL LOW (ref 8.9–10.3)
Chloride: 106 mmol/L (ref 98–111)
Creatinine, Ser: 0.84 mg/dL (ref 0.61–1.24)
GFR, Estimated: >60 mL/min (ref >60)
Glucose, Bld: 190 mg/dL — ABNORMAL HIGH (ref 70–99)
Potassium: 4.2 mmol/L (ref 3.5–5.1)
Sodium: 140 mmol/L (ref 135–145)

## 2024-02-03 LAB — CBC
HCT: 38 % — ABNORMAL LOW (ref 39.0–52.0)
Hemoglobin: 12.1 g/dL — ABNORMAL LOW (ref 13.0–17.0)
MCH: 29.4 pg (ref 26.0–34.0)
MCHC: 31.8 g/dL (ref 30.0–36.0)
MCV: 92.5 fL (ref 80.0–100.0)
Platelets: 208 K/uL (ref 150–400)
RBC: 4.11 MIL/uL — ABNORMAL LOW (ref 4.22–5.81)
RDW: 16.1 % — ABNORMAL HIGH (ref 11.5–15.5)
WBC: 6 K/uL (ref 4.0–10.5)
nRBC: 0 % (ref 0.0–0.2)

## 2024-02-03 LAB — HEPARIN LEVEL (UNFRACTIONATED)
Heparin Unfractionated: 0.1 [IU]/mL — ABNORMAL LOW (ref 0.30–0.70)
Heparin Unfractionated: 0.14 [IU]/mL — ABNORMAL LOW (ref 0.30–0.70)

## 2024-02-03 LAB — GLUCOSE, CAPILLARY
Glucose-Capillary: 117 mg/dL — ABNORMAL HIGH (ref 70–99)
Glucose-Capillary: 152 mg/dL — ABNORMAL HIGH (ref 70–99)
Glucose-Capillary: 165 mg/dL — ABNORMAL HIGH (ref 70–99)
Glucose-Capillary: 188 mg/dL — ABNORMAL HIGH (ref 70–99)

## 2024-02-03 MED ORDER — HEPARIN 30,000 UNITS/1000 ML (OHS) CELLSAVER SOLUTION
Status: DC
  Filled 2024-02-03: qty 1000

## 2024-02-03 MED ORDER — FUROSEMIDE 10 MG/ML IJ SOLN
40.0000 mg | Freq: Every day | INTRAMUSCULAR | Status: DC
  Administered 2024-02-03: 40 mg via INTRAVENOUS
  Filled 2024-02-03: qty 4

## 2024-02-03 MED ORDER — NITROGLYCERIN IN D5W 200-5 MCG/ML-% IV SOLN
2.0000 ug/min | INTRAVENOUS | Status: DC
  Filled 2024-02-03: qty 250

## 2024-02-03 MED ORDER — EPINEPHRINE HCL 5 MG/250ML IV SOLN IN NS
0.0000 ug/min | INTRAVENOUS | Status: DC
  Filled 2024-02-03: qty 250

## 2024-02-03 MED ORDER — TRANEXAMIC ACID (OHS) PUMP PRIME SOLUTION
2.0000 mg/kg | INTRAVENOUS | Status: DC
  Filled 2024-02-03: qty 2.29

## 2024-02-03 MED ORDER — TRANEXAMIC ACID 1000 MG/10ML IV SOLN
1.5000 mg/kg/h | INTRAVENOUS | Status: AC
  Administered 2024-02-06 (×4): 1.5 mg/kg/h via INTRAVENOUS
  Filled 2024-02-03: qty 25

## 2024-02-03 MED ORDER — VANCOMYCIN HCL 1.5 G IV SOLR
1500.0000 mg | INTRAVENOUS | Status: AC
  Administered 2024-02-06 (×2): 1500 mg via INTRAVENOUS
  Filled 2024-02-03: qty 30

## 2024-02-03 MED ORDER — TRANEXAMIC ACID (OHS) BOLUS VIA INFUSION
15.0000 mg/kg | INTRAVENOUS | Status: AC
  Administered 2024-02-06 (×2): 1720.5 mg via INTRAVENOUS
  Filled 2024-02-03: qty 1721

## 2024-02-03 MED ORDER — INSULIN GLARGINE-YFGN 100 UNIT/ML ~~LOC~~ SOLN
20.0000 [IU] | Freq: Every day | SUBCUTANEOUS | Status: DC
  Administered 2024-02-03 – 2024-02-04 (×2): 20 [IU] via SUBCUTANEOUS
  Administered 2024-02-05: 15 [IU] via SUBCUTANEOUS
  Filled 2024-02-03 (×4): qty 0.2

## 2024-02-03 MED ORDER — MANNITOL 20 % IV SOLN
INTRAVENOUS | Status: DC
  Filled 2024-02-03: qty 13

## 2024-02-03 MED ORDER — PLASMA-LYTE A IV SOLN
INTRAVENOUS | Status: DC
  Filled 2024-02-03: qty 2.5

## 2024-02-03 MED ORDER — PHENYLEPHRINE HCL-NACL 20-0.9 MG/250ML-% IV SOLN
30.0000 ug/min | INTRAVENOUS | Status: AC
  Administered 2024-02-06 (×2): 20 ug/min via INTRAVENOUS
  Filled 2024-02-03: qty 250

## 2024-02-03 MED ORDER — CEFAZOLIN SODIUM-DEXTROSE 2-4 GM/100ML-% IV SOLN
2.0000 g | INTRAVENOUS | Status: AC
  Administered 2024-02-06 (×2): 2 g via INTRAVENOUS
  Filled 2024-02-03: qty 100

## 2024-02-03 MED ORDER — INSULIN ASPART 100 UNIT/ML IJ SOLN
0.0000 [IU] | Freq: Three times a day (TID) | INTRAMUSCULAR | Status: DC
  Administered 2024-02-03 (×2): 3 [IU] via SUBCUTANEOUS
  Administered 2024-02-04: 5 [IU] via SUBCUTANEOUS
  Administered 2024-02-04 (×2): 2 [IU] via SUBCUTANEOUS
  Administered 2024-02-05: 11 [IU] via SUBCUTANEOUS
  Administered 2024-02-05: 5 [IU] via SUBCUTANEOUS
  Administered 2024-02-05: 3 [IU] via SUBCUTANEOUS
  Administered 2024-02-06 (×2): 5 [IU] via SUBCUTANEOUS

## 2024-02-03 MED ORDER — INSULIN REGULAR(HUMAN) IN NACL 100-0.9 UT/100ML-% IV SOLN
INTRAVENOUS | Status: AC
  Administered 2024-02-06 (×2): 4.2 [IU]/h via INTRAVENOUS
  Filled 2024-02-03: qty 100

## 2024-02-03 MED ORDER — POTASSIUM CHLORIDE 2 MEQ/ML IV SOLN
80.0000 meq | INTRAVENOUS | Status: DC
  Filled 2024-02-03: qty 40

## 2024-02-03 MED ORDER — NOREPINEPHRINE 4 MG/250ML-% IV SOLN
0.0000 ug/min | INTRAVENOUS | Status: AC
  Administered 2024-02-06 (×2): 2 ug/min via INTRAVENOUS
  Filled 2024-02-03: qty 250

## 2024-02-03 MED ORDER — MILRINONE LACTATE IN DEXTROSE 20-5 MG/100ML-% IV SOLN
0.3000 ug/kg/min | INTRAVENOUS | Status: DC
  Filled 2024-02-03: qty 100

## 2024-02-03 MED ORDER — DEXMEDETOMIDINE HCL IN NACL 400 MCG/100ML IV SOLN
0.1000 ug/kg/h | INTRAVENOUS | Status: AC
  Administered 2024-02-06 (×2): .5 ug/kg/h via INTRAVENOUS
  Filled 2024-02-03 (×2): qty 100

## 2024-02-03 NOTE — Progress Notes (Addendum)
 Progress Note  Patient Name: Colin Ramirez Date of Encounter: 02/03/2024 [facility] Cardiologist: MARALEE ELSPETH BROCKS, MD   Interval Summary    58 yr old male with PMH of CAD, type 2 DM, HTN, who is admitted for unstable angina. R/L heart cath 01/26/24 showed severe LAD stenosis, severe proximal Circumflex stenosis and severe stenosis of distal OM, moderate mid and distal stenosis of RCA. Severe stenosis in the moderate caliber posterolateral artery and severe stenosis in the moderate caliber PDA. Mild elevation of right and left heart pressures. Mild to moderate aortic stenosis (14 mm Hg peak to peak gradient, 16 mmHg mean gradient). He was seen by CT surgery, planned for CABG/AVR/ Bentall given multivessel CAD, dilated aortic root and moderate aortic stenosis.   Patient states he has not had any chest pain. He recalls having significant SOB at admission, yesterday he started sleeping in the bed instead of recliner, felt symptom had improved. He noted his legs had been swelling since Sat, now has 2 blisters on his right great toe and 4th digit. He denied significant pain of the foot. He has weight gain from 239 to 252 ib since admission. He has been ambulating daily, denied chronic bedrest. He had recent hip surgery, suffered pain down his legs which improved with gabapentin     Vital Signs Vitals:   02/02/24 1943 02/03/24 0002 02/03/24 0416 02/03/24 0523  BP: (!) 145/83 101/67 (!) 148/93   Pulse: 63 (!) 49 (!) 52   Resp: 16 17 (!) 22   Temp: 98.6 F (37 C) (!) 97.4 F (36.3 C) 97.6 F (36.4 C)   TempSrc: Oral Oral Oral   SpO2: 95% 93% 98%   Weight:    114.7 kg  Height:        Intake/Output Summary (Last 24 hours) at 02/03/2024 0852 Last data filed at 02/02/2024 1300 Gross per 24 hour  Intake 120 ml  Output --  Net 120 ml      02/03/2024    5:23 AM 02/02/2024    4:18 AM 02/01/2024    4:43 AM  Last 3 Weights  Weight (lbs) 252 lb 13.9 oz 251 lb 6.4 oz 246 lb 14.4 oz   Weight (kg) 114.7 kg 114.034 kg 111.993 kg      Telemetry/ECG  Sinus bradycardia 50s, first degree AVB, no long pause >3s - Personally Reviewed  Physical Exam  GEN: No acute distress.   Neck: No JVD Cardiac: RRR, soft systolic murmur  Respiratory: Fine rales at base bilaterally, speaks full sentence, mild DOE GI: Soft, nontender, non-distended  MS: BLE 1+ edema, blister noted of right great toe and 4th digit   Assessment & Plan   Unstable Angina Multivessel CAD   - presented with chest pain worse with exertion - Hs trop negative x3, EKG no acute changes, Echo see below  - R/L heart cath 01/26/24 showed severe LAD stenosis, severe proximal Circumflex stenosis, severe stenosis of distal OM, moderate mid and distal stenosis of RCA. Severe stenosis in the moderate caliber posterolateral artery and severe stenosis in the moderate caliber PDA. Mild elevation of right and left heart pressures (RA mean , RV 33/7 mmHg, PA mean , LVEDP ). Mild to moderate aortic stenosis (14 mm Hg peak to peak gradient, 16 mmHg mean gradient).  - plan for CABG/AVR/ Bentall procedure by CT surgery, currently scheduled on 8/11. S/p dental extractions  - Chest pain free, SOB improved  - Medical therapy: continue IV heparin  gtt and nitroglycerin  gtt, continue  ASA 81 mg daily, amlodipine  2.5 mg daily, ramipril  10mg , lipitor 40 mg daily; no BB given baseline bradycardia    Ascending thoracic aneurysm  Bicuspid aortic valve with mild-moderate AS  - CTA this admission with aneurysmal dilatation of the ascending thoracic aorta measuring 4.9 cm  - AS with 14 mm Hg peak to peak gradient, 16 mmHg mean gradient on cath  - Pending surgery as mentioned above   Acute HFpEF - progressive DOE and BLE edema since Sat, weight gain 239 >>252 IBS since admission - Echo 01/25/24 with LVEF 55-60%, bicuspid aortic valve with mild to moderate aortic stenosis. VTI measures 1.62 cm, mean gradient 14.0 mmHg. Vmax  measures 2.48 m/s, mildly reduced RV, Aneurysm of the ascending aorta, measuring 51mm - will start IV Lasix  40mg  daily today, watch daily weight and I&o and renal index    Tobacco use  - Patient has been counseled on tobacco cessation    HLD  - Lipid panel this admission with LDL 46  - Continue lipitor 40 mg daily    HTN  - Continue amlodipine  2.5 mg daily and ramipril  10mg , BP controlled fairly    DM Hypothyroidism Obesity GERD Chronic pain - per primary team     For questions or updates, please contact [facility] Please consult [email or web address] for contact info under       Signed, Xika Zhao, NP   Patient seen, examined. Available data reviewed. Agree with findings, assessment, and plan as outlined by Shirl Fruits, NP.  On my exam today, the patient is alert, oriented, in no distress.  HEENT is normal, JVP is normal, lungs are clear bilaterally, heart is regular rate and rhythm with a 2/6 ejection murmur at the right upper sternal border, abdomen is soft and nontender, lower extremities have 2+ pretibial edema.  The patient has evidence of volume overload consistent with acute on chronic HFpEF in the setting of continuous heparin  and nitroglycerin  infusions probably adding some excess volume.  I agree with diuresing him to aim for euvolemia as he goes into surgery on Monday.  He is written for 40 mg IV furosemide  daily.  Follow clinical response and daily metabolic panel.  Otherwise, continue current medical therapy as outlined above.  The patient appears clinically stable for surgery on Monday.  Colin Ramirez, M.D. 02/03/2024 11:03 AM
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
## 2024-02-03 NOTE — Progress Notes (Signed)
 PROGRESS NOTE    Colin Ramirez  FMW:[id number] DOB: 10/25/1965 DOA: 01/24/2024 PCP: [name], MD (Inactive)  Chief Complaint  Patient presents with   Chest Pain    Brief Narrative:   Colin Ramirez is an 58 y.o. male past medical history significant for coronary artery disease, with Castin Donaghue left heart cath on 10/28/2017 that showed two-vessel disease with moderate stenosis, diabetes mellitus type 2, essential hypertension admitted to [facility] when he started having chest pain intermittent nonradiating that started about 6 days prior to admission not associated with shortness of breath or palpitation he does relate recent nausea and vomiting.    Now s/p LHC with multivessel disease.  Plan is for CABG and Bentall procedure.   Assessment & Plan:   Principal Problem:   Atypical chest pain Active Problems:   Primary hypertension   HLD (hyperlipidemia)   GERD (gastroesophageal reflux disease)   DM2 (diabetes mellitus, type 2) (HCC)   Hypokalemia   Unstable angina (HCC)   Aneurysm of ascending aorta without rupture (HCC)   Aortic stenosis with bicuspid valve   Acute heart failure with preserved ejection fraction (HCC)  Unstable angina Mild to moderate AS  Ascending Aortic Aneurysm Continue IV heparin , aspirin , statins.   Left heart cath showed multivessel disease - plan is for CABG and Bentall surgery.  CT angio that showed ascending aortic aneurysm measuring 4.9 cm  S/p extraction tooth #18 on 8/5 Plan for surgery 8/11 Currently on nitroglycerin  gtt  Further management per CV surgery and cardiology   HFpEF Lasix  per cards  Bilateral hip pain  Sciatica:  Describes burning that radiates from hips into groin and down to knees, no TTP to either hip on exam, + SLR.  Suspect this represents sciatica rather than pain from hips.  No numbness, no bowel/bladder incontinence. Diffuse degenerative disc disease and facet hypertrophy throughout lumbar spine.   Bilateral hip arthroplasties without complication. Gabapentin  working well  Uncontrolled Diabetes mellitus type 2  Peripheral Neuropathy: Hemoglobin A1c was 7.4 Basal, bolus, SSI Holding all oral hypoglycemic agents Gabapentin  helping with neuropathy   Pinkeye unilaterally Hordeolum  Warm compress Started on eyedrops, and erythromycin  ointment.   Essential hypertension: HCTZ was held on admission Amlodipine  (lower dose) ramipril  Coreg  on hold   Hyperlipidemia: Continue statins.   GERD: Continue PPI   Hypothyroidism Synthroid    Chronic Pain On oral dilaudid  at home for chronic back pain Continue, will add lidocaine  patch  IV fentanyl  ordered for breakthrough   AKI: Resolved  Hypokalemia: resolved.  Morbid obesity with Madilyne Tadlock BMI greater than 30: Body mass index is 32.57 kg/m.    DVT prophylaxis: heparin  gtt Code Status: full Family Communication: none Disposition:   Status is: Inpatient Remains inpatient appropriate because: need for continued inpatient care   Consultants:  Cardiology Dental  CT surgery  Procedures:  Echo 7/30  IMPRESSIONS     1. Left ventricular ejection fraction, by estimation, is 55 to 60%. The  left ventricle has normal function. The left ventricle has no regional  wall motion abnormalities. Left ventricular diastolic parameters were  normal.   2. Calcified aortic valve. Given age and aortic dilation would be  concerned for bicuspid valve. DVI 0.37. The aortic valve has an  indeterminant number of cusps. Aortic valve regurgitation is not  visualized. Mild to moderate aortic valve stenosis. Aortic   valve area, by VTI measures 1.62 cm. Aortic valve mean gradient measures  14.0 mmHg. Aortic valve Vmax measures 2.48 m/s.  3. Right ventricular systolic function is mildly reduced. The right  ventricular size is normal.   4. Left atrial size was mildly dilated.   5. The mitral valve is normal in structure. No evidence of mitral  valve  regurgitation. No evidence of mitral stenosis.   6. Aortic dilatation noted. Aneurysm of the ascending aorta, measuring 51  mm.   7. The inferior vena cava is normal in size with greater than 50%  respiratory variability, suggesting right atrial pressure of 3 mmHg.   LHC 7/31  Prox LAD to Mid LAD lesion is 75% stenosed.   Ost 1st Diag to 1st Diag lesion is 30% stenosed.   Ost 2nd Diag to 2nd Diag lesion is 30% stenosed.   Mid RCA to Dist RCA lesion is 50% stenosed.   Prox RCA lesion is 40% stenosed.   RPDA lesion is 90% stenosed.   RPAV lesion is 70% stenosed.   Dist RCA lesion is 50% stenosed.   Prox Cx lesion is 90% stenosed.   Ost 3rd Mrg lesion is 90% stenosed.   Prox LAD lesion is 70% stenosed.   Severe mid LAD stenosis Severe proximal Circumflex stenosis and severe stenosis involving the most distal obtuse marginal branch.  Large dominant RCA with moderate mid and distal stenosis. Severe stenosis in the moderate caliber posterolateral artery and severe stenosis in the moderate caliber PDA.  Mild elevation of right and left heart pressures Mild to moderate aortic stenosis (14 mm Hg peak to peak gradient, 16 mmHg mean gradient).   Recommendations: Will review cath images with the IC team tomorrow. Given multi-vessel CAD, dilated aortic root and moderate aortic stenosis, will need to consider CT surgery consult for Bentall procedure + CABG. We will call the CT surgery team tomorrow given the late timing of the case being finished tonight.   PreCabg US  Summary:  Right Carotid: Velocities in the right ICA are consistent with Adon Gehlhausen 1-39%  stenosis.   Left Carotid: Velocities in the left ICA are consistent with Gelene Recktenwald 1-39%  stenosis.  Vertebrals: Bilateral vertebral arteries demonstrate antegrade flow.  Subclavians: Normal flow hemodynamics were seen in bilateral subclavian               arteries.   Right ABI: Resting right ankle-brachial index is within normal range.  Left ABI:  Resting left ankle-brachial index is within normal range.  Right Upper Extremity: Doppler waveforms remain within normal limits with  right radial compression. Doppler waveforms remain within normal limits  with right ulnar compression.  Left Upper Extremity: Doppler waveforms remain within normal limits with  left radial compression. Doppler waveforms remain within normal limits  with left ulnar compression.     8/5 EXTRACTION tooth # 18.   Antimicrobials:  Anti-infectives (From admission, onward)    Start     Dose/Rate Route Frequency Ordered Stop   01/31/24 1345  clindamycin  (CLEOCIN ) IVPB 600 mg        600 mg 100 mL/hr over 30 Minutes Intravenous To [facility]Stay Surgical 01/30/24 1948 01/31/24 1433       Subjective: No new complaints, new swelling today  Objective: Vitals:   02/03/24 0416 02/03/24 0523 02/03/24 0805 02/03/24 1205  BP: (!) 148/93  (!) 139/94 (!) 157/84  Pulse: (!) 52  (!) 56 (!) 54  Resp: (!) 22  14 16   Temp: 97.6 F (36.4 C)  97.6 F (36.4 C) 98.6 F (37 C)  TempSrc: Oral  Oral Oral  SpO2: 98%  98% 98%  Weight:  114.7 kg    Height:        Intake/Output Summary (Last 24 hours) at 02/03/2024 1438 Last data filed at 02/03/2024 1300 Gross per 24 hour  Intake 240 ml  Output --  Net 240 ml   Filed Weights   02/01/24 0443 02/02/24 0418 02/03/24 0523  Weight: 112 kg 114 kg 114.7 kg    Examination:  General: No acute distress. Cardiovascular: RRR Lungs: unlabored Neurological: Alert and oriented 3. Moves all extremities 4 with equal strength. Cranial nerves II through XII grossly intact. Skin: Warm and dry. No rashes or lesions. Extremities: bilateral LE edema, 2+    Data Reviewed: I have personally reviewed following labs and imaging studies  CBC: Recent Labs  Lab 01/30/24 0304 01/31/24 0302 02/01/24 1035 02/01/24 2345 02/03/24 0244  WBC 6.4 6.8 6.5 5.5 6.0  HGB 12.8* 13.1 14.5 13.1 12.1*  HCT 39.9 41.3 45.3 40.2 38.0*  MCV 90.7  92.0 91.0 90.5 92.5  PLT 225 213 232 233 208    Basic Metabolic Panel: Recent Labs  Lab 01/28/24 0302 01/30/24 0859 02/01/24 1035 02/01/24 2345 02/03/24 0244  NA 140 139 137 137 140  K 3.7 3.8 3.9 3.9 4.2  CL 107 105 103 102 106  CO2 25 25 25 25 28   GLUCOSE 202* 194* 166* 200* 190*  BUN 16 12 16 17 16   CREATININE 0.68 0.69 0.81 0.96 0.84  CALCIUM  8.5* 8.9 9.3 8.7* 8.6*    GFR: Estimated Creatinine Clearance: 124.1 mL/min (by C-G formula based on SCr of 0.84 mg/dL).  Liver Function Tests: No results for input(s): AST, ALT, ALKPHOS, BILITOT, PROT, ALBUMIN in the last 168 hours.  CBG: Recent Labs  Lab 02/02/24 1108 02/02/24 1558 02/02/24 1943 02/03/24 0605 02/03/24 1210  GLUCAP 159* 225* 228* 117* 152*     No results found for this or any previous visit (from the past 240 hours).       Radiology Studies: No results found.       Scheduled Meds:  amLODipine   2.5 mg Oral Daily   artificial tears   Both Eyes Q8H   aspirin  EC  81 mg Oral Daily   atorvastatin   40 mg Oral Daily   furosemide   40 mg Intravenous Daily   gabapentin   100 mg Oral BID WC   And   gabapentin   300 mg Oral QHS   insulin  aspart  0-15 Units Subcutaneous TID WC   insulin  aspart  0-5 Units Subcutaneous QHS   insulin  aspart  3 Units Subcutaneous TID WC   insulin  glargine-yfgn  20 Units Subcutaneous Daily   levothyroxine   175 mcg Oral Daily   lidocaine   3 patch Transdermal Q24H   pantoprazole   40 mg Oral Daily   ramipril   10 mg Oral Daily   sodium chloride  flush  3 mL Intravenous Q12H   Continuous Infusions:  heparin  1,800 Units/hr (02/03/24 0809)   nitroGLYCERIN  5 mcg/min (01/30/24 1819)     LOS: 7 days    Time spent: over 30 min    Meliton Monte, MD [facility]   To contact the attending provider between 7A-7P or the covering provider during after hours 7P-7A, please log into the web site [email or web address] and access using universal [place] password  for that web site. If you do not have the password, please call the hospital operator.  02/03/2024, 2:38 PM

## 2024-02-03 NOTE — Plan of Care (Signed)

## 2024-02-04 ENCOUNTER — Inpatient Hospital Stay (HOSPITAL_COMMUNITY): Payer: PRIVATE HEALTH INSURANCE

## 2024-02-04 DIAGNOSIS — R0789 Other chest pain: Secondary | ICD-10-CM | POA: Diagnosis not present

## 2024-02-04 DIAGNOSIS — I200 Unstable angina: Secondary | ICD-10-CM | POA: Diagnosis not present

## 2024-02-04 LAB — BASIC METABOLIC PANEL WITH GFR
Anion gap: 6 (ref 5–15)
BUN: 15 mg/dL (ref 6–20)
CO2: 25 mmol/L (ref 22–32)
Calcium: 8.8 mg/dL — ABNORMAL LOW (ref 8.9–10.3)
Chloride: 107 mmol/L (ref 98–111)
Creatinine, Ser: 0.78 mg/dL (ref 0.61–1.24)
GFR, Estimated: >60 mL/min (ref >60)
Glucose, Bld: 152 mg/dL — ABNORMAL HIGH (ref 70–99)
Potassium: 3.9 mmol/L (ref 3.5–5.1)
Sodium: 138 mmol/L (ref 135–145)

## 2024-02-04 LAB — GLUCOSE, CAPILLARY
Glucose-Capillary: 122 mg/dL — ABNORMAL HIGH (ref 70–99)
Glucose-Capillary: 137 mg/dL — ABNORMAL HIGH (ref 70–99)
Glucose-Capillary: 218 mg/dL — ABNORMAL HIGH (ref 70–99)
Glucose-Capillary: 212 mg/dL — ABNORMAL HIGH (ref 70–99)

## 2024-02-04 LAB — CBC
HCT: 39.1 % (ref 39.0–52.0)
Hemoglobin: 12.6 g/dL — ABNORMAL LOW (ref 13.0–17.0)
MCH: 29.4 pg (ref 26.0–34.0)
MCHC: 32.2 g/dL (ref 30.0–36.0)
MCV: 91.1 fL (ref 80.0–100.0)
Platelets: 215 K/uL (ref 150–400)
RBC: 4.29 MIL/uL (ref 4.22–5.81)
RDW: 16 % — ABNORMAL HIGH (ref 11.5–15.5)
WBC: 5.9 K/uL (ref 4.0–10.5)
nRBC: 0 % (ref 0.0–0.2)

## 2024-02-04 LAB — MAGNESIUM: Magnesium: 2 mg/dL (ref 1.7–2.4)

## 2024-02-04 LAB — HEPARIN LEVEL (UNFRACTIONATED): Heparin Unfractionated: 0.33 [IU]/mL (ref 0.30–0.70)

## 2024-02-04 MED ORDER — POTASSIUM CHLORIDE CRYS ER 20 MEQ PO TBCR
40.0000 meq | EXTENDED_RELEASE_TABLET | Freq: Once | ORAL | Status: AC
  Administered 2024-02-04: 40 meq via ORAL
  Filled 2024-02-04: qty 2

## 2024-02-04 MED ORDER — FUROSEMIDE 10 MG/ML IJ SOLN: 40.0000 mg | Freq: Once | INTRAMUSCULAR | Status: DC

## 2024-02-04 MED ORDER — FUROSEMIDE 10 MG/ML IJ SOLN
60.0000 mg | Freq: Two times a day (BID) | INTRAMUSCULAR | Status: AC
  Administered 2024-02-04 (×2): 60 mg via INTRAVENOUS
  Filled 2024-02-04 (×2): qty 6

## 2024-02-04 NOTE — Plan of Care (Signed)
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
## 2024-02-04 NOTE — Progress Notes (Signed)
 PROGRESS NOTE    Colin Ramirez  FMW:[id number] DOB: 12-Sep-1965 DOA: 01/24/2024 PCP: [name], MD (Inactive)  Chief Complaint  Patient presents with   Chest Pain    Brief Narrative:   Colin Ramirez is an 58 y.o. male past medical history significant for coronary artery disease, with Effrey Davidow left heart cath on 10/28/2017 that showed two-vessel disease with moderate stenosis, diabetes mellitus type 2, essential hypertension admitted to [facility] when he started having chest pain intermittent nonradiating that started about 6 days prior to admission not associated with shortness of breath or palpitation he does relate recent nausea and vomiting.    Now s/p LHC with multivessel disease.  Plan is for CABG and Bentall procedure.   Assessment & Plan:   Principal Problem:   Atypical chest pain Active Problems:   Primary hypertension   HLD (hyperlipidemia)   GERD (gastroesophageal reflux disease)   DM2 (diabetes mellitus, type 2) (HCC)   Hypokalemia   Unstable angina (HCC)   Aneurysm of ascending aorta without rupture (HCC)   Aortic stenosis with bicuspid valve   Acute heart failure with preserved ejection fraction (HCC)  Unstable angina Mild to moderate AS  Ascending Aortic Aneurysm Continue IV heparin , aspirin , statins.   Left heart cath showed multivessel disease - plan is for CABG and Bentall surgery.  CT angio that showed ascending aortic aneurysm measuring 4.9 cm  S/p extraction tooth #18 on 8/5 Plan for surgery 8/11 Currently on nitroglycerin  gtt  Further management per CV surgery and cardiology   HFpEF Diuresing  will add extra dose of lasix  this PM C/o some SOB today, will get plain film  Bilateral hip pain  Sciatica:  Plain films with diffuse degenerative disc disease and facet hypertrophy throughout lumbar spine.  Bilateral hip arthroplasties without complication. Gabapentin  working well  Uncontrolled Diabetes mellitus type 2   Peripheral Neuropathy: Hemoglobin A1c was 7.4 Basal, bolus, SSI Holding all oral hypoglycemic agents Gabapentin  helping with neuropathy   Pinkeye unilaterally Hordeolum  Warm compress Started on eyedrops, and erythromycin  ointment.   Essential hypertension: HCTZ was held on admission Amlodipine  (lower dose) ramipril  Coreg  on hold   Hyperlipidemia: Continue statins.   GERD: Continue PPI   Hypothyroidism Synthroid    Chronic Pain On oral dilaudid  at home for chronic back pain Continue, will add lidocaine  patch  IV fentanyl  ordered for breakthrough   AKI: Resolved  Hypokalemia: resolved.  Morbid obesity with Colin Ramirez BMI greater than 30: Body mass index is 32.57 kg/m.    DVT prophylaxis: heparin  gtt Code Status: full Family Communication: none Disposition:   Status is: Inpatient Remains inpatient appropriate because: need for continued inpatient care   Consultants:  Cardiology Dental  CT surgery  Procedures:  Echo 7/30  IMPRESSIONS     1. Left ventricular ejection fraction, by estimation, is 55 to 60%. The  left ventricle has normal function. The left ventricle has no regional  wall motion abnormalities. Left ventricular diastolic parameters were  normal.   2. Calcified aortic valve. Given age and aortic dilation would be  concerned for bicuspid valve. DVI 0.37. The aortic valve has an  indeterminant number of cusps. Aortic valve regurgitation is not  visualized. Mild to moderate aortic valve stenosis. Aortic   valve area, by VTI measures 1.62 cm. Aortic valve mean gradient measures  14.0 mmHg. Aortic valve Vmax measures 2.48 m/s.   3. Right ventricular systolic function is mildly reduced. The right  ventricular size is normal.  4. Left atrial size was mildly dilated.   5. The mitral valve is normal in structure. No evidence of mitral valve  regurgitation. No evidence of mitral stenosis.   6. Aortic dilatation noted. Aneurysm of the ascending aorta,  measuring 51  mm.   7. The inferior vena cava is normal in size with greater than 50%  respiratory variability, suggesting right atrial pressure of 3 mmHg.   LHC 7/31  Prox LAD to Mid LAD lesion is 75% stenosed.   Ost 1st Diag to 1st Diag lesion is 30% stenosed.   Ost 2nd Diag to 2nd Diag lesion is 30% stenosed.   Mid RCA to Dist RCA lesion is 50% stenosed.   Prox RCA lesion is 40% stenosed.   RPDA lesion is 90% stenosed.   RPAV lesion is 70% stenosed.   Dist RCA lesion is 50% stenosed.   Prox Cx lesion is 90% stenosed.   Ost 3rd Mrg lesion is 90% stenosed.   Prox LAD lesion is 70% stenosed.   Severe mid LAD stenosis Severe proximal Circumflex stenosis and severe stenosis involving the most distal obtuse marginal branch.  Large dominant RCA with moderate mid and distal stenosis. Severe stenosis in the moderate caliber posterolateral artery and severe stenosis in the moderate caliber PDA.  Mild elevation of right and left heart pressures Mild to moderate aortic stenosis (14 mm Hg peak to peak gradient, 16 mmHg mean gradient).   Recommendations: Will review cath images with the IC team tomorrow. Given multi-vessel CAD, dilated aortic root and moderate aortic stenosis, will need to consider CT surgery consult for Bentall procedure + CABG. We will call the CT surgery team tomorrow given the late timing of the case being finished tonight.   PreCabg US  Summary:  Right Carotid: Velocities in the right ICA are consistent with Colin Ramirez 1-39%  stenosis.   Left Carotid: Velocities in the left ICA are consistent with Colin Ramirez 1-39%  stenosis.  Vertebrals: Bilateral vertebral arteries demonstrate antegrade flow.  Subclavians: Normal flow hemodynamics were seen in bilateral subclavian               arteries.   Right ABI: Resting right ankle-brachial index is within normal range.  Left ABI: Resting left ankle-brachial index is within normal range.  Right Upper Extremity: Doppler waveforms remain within  normal limits with  right radial compression. Doppler waveforms remain within normal limits  with right ulnar compression.  Left Upper Extremity: Doppler waveforms remain within normal limits with  left radial compression. Doppler waveforms remain within normal limits  with left ulnar compression.     8/5 EXTRACTION tooth # 18.   Antimicrobials:  Anti-infectives (From admission, onward)    Start     Dose/Rate Route Frequency Ordered Stop   02/06/24 0400  Vancomycin  (VANCOCIN ) 1,500 mg in sodium chloride  0.9 % 500 mL IVPB        1,500 mg 250 mL/hr over 120 Minutes Intravenous To Surgery 02/03/24 1445 02/07/24 0400   02/06/24 0400  ceFAZolin  (ANCEF ) IVPB 2g/100 mL premix        2 g 200 mL/hr over 30 Minutes Intravenous To Surgery 02/03/24 1445 02/07/24 0400   02/06/24 0400  ceFAZolin  (ANCEF ) IVPB 2g/100 mL premix        2 g 200 mL/hr over 30 Minutes Intravenous To Surgery 02/03/24 1445 02/07/24 0400   01/31/24 1345  clindamycin  (CLEOCIN ) IVPB 600 mg        600 mg 100 mL/hr over 30 Minutes Intravenous To  [facility]Stay Surgical 01/30/24 1948 01/31/24 1433       Subjective: Swelling Colin Ramirez little better, mild SOB No CP  Objective: Vitals:   02/03/24 1625 02/03/24 1948 02/04/24 0048 02/04/24 0533  BP: 122/78 138/79 132/77 (!) 146/82  Pulse: (!) 57 (!) 54 (!) 58 (!) 50  Resp: 18 16 17 11   Temp: 98.4 F (36.9 C) 97.8 F (36.6 C) 97.9 F (36.6 C) 97.8 F (36.6 C)  TempSrc:  Oral Oral Oral  SpO2: 96% 97% 93% 94%  Weight:    114.3 kg  Height:        Intake/Output Summary (Last 24 hours) at 02/04/2024 0757 Last data filed at 02/04/2024 0321 Gross per 24 hour  Intake 1512.18 ml  Output 1400 ml  Net 112.18 ml   Filed Weights   02/02/24 0418 02/03/24 0523 02/04/24 0533  Weight: 114 kg 114.7 kg 114.3 kg    Examination:  General: No acute distress. Cardiovascular: RRR Lungs: CTAB Neurological: Alert and oriented 3. Moves all extremities 4 with equal strength. Cranial nerves II  through XII grossly intact. Extremities: continued 2+ LE edema  Data Reviewed: I have personally reviewed following labs and imaging studies  CBC: Recent Labs  Lab 01/31/24 0302 02/01/24 1035 02/01/24 2345 02/03/24 0244 02/04/24 0252  WBC 6.8 6.5 5.5 6.0 5.9  HGB 13.1 14.5 13.1 12.1* 12.6*  HCT 41.3 45.3 40.2 38.0* 39.1  MCV 92.0 91.0 90.5 92.5 91.1  PLT 213 232 233 208 215    Basic Metabolic Panel: Recent Labs  Lab 01/30/24 0859 02/01/24 1035 02/01/24 2345 02/03/24 0244 02/04/24 0252  NA 139 137 137 140 138  K 3.8 3.9 3.9 4.2 3.9  CL 105 103 102 106 107  CO2 25 25 25 28 25   GLUCOSE 194* 166* 200* 190* 152*  BUN 12 16 17 16 15   CREATININE 0.69 0.81 0.96 0.84 0.78  CALCIUM  8.9 9.3 8.7* 8.6* 8.8*    GFR: Estimated Creatinine Clearance: 130.1 mL/min (by C-G formula based on SCr of 0.78 mg/dL).  Liver Function Tests: No results for input(s): AST, ALT, ALKPHOS, BILITOT, PROT, ALBUMIN  in the last 168 hours.  CBG: Recent Labs  Lab 02/03/24 0605 02/03/24 1210 02/03/24 1616 02/03/24 2110 02/04/24 0629  GLUCAP 117* 152* 165* 188* 122*     No results found for this or any previous visit (from the past 240 hours).       Radiology Studies: No results found.       Scheduled Meds:  amLODipine   2.5 mg Oral Daily   artificial tears   Both Eyes Q8H   aspirin  EC  81 mg Oral Daily   atorvastatin   40 mg Oral Daily   [START ON 02/06/2024] epinephrine   0-10 mcg/min Intravenous To OR   furosemide   40 mg Intravenous Daily   furosemide   40 mg Intravenous Once   gabapentin   100 mg Oral BID WC   And   gabapentin   300 mg Oral QHS   [START ON 02/06/2024] heparin  sodium (porcine) 2,500 Units, papaverine  30 mg in electrolyte-Milos Milligan (PLASMALYTE-Oza Oberle PH 7.4) 500 mL irrigation   Irrigation To OR   insulin  aspart  0-15 Units Subcutaneous TID WC   insulin  aspart  0-5 Units Subcutaneous QHS   insulin  aspart  3 Units Subcutaneous TID WC   insulin  glargine-yfgn  20  Units Subcutaneous Daily   [START ON 02/06/2024] insulin    Intravenous To OR   [START ON 02/06/2024] [place] Blood Cardioplegia vial (lidocaine /magnesium /mannitol  0.26g-4g-6.4g)   Intracoronary To OR  levothyroxine   175 mcg Oral Daily   lidocaine   3 patch Transdermal Q24H   pantoprazole   40 mg Oral Daily   [START ON 02/06/2024] phenylephrine   30-200 mcg/min Intravenous To OR   [START ON 02/06/2024] potassium chloride   80 mEq Other To OR   ramipril   10 mg Oral Daily   sodium chloride  flush  3 mL Intravenous Q12H   [START ON 02/06/2024] tranexamic acid   15 mg/kg Intravenous To OR   [START ON 02/06/2024] tranexamic acid   2 mg/kg Intracatheter To OR   Continuous Infusions:  [START ON 02/06/2024]  ceFAZolin  (ANCEF ) IV     [START ON 02/06/2024]  ceFAZolin  (ANCEF ) IV     [START ON 02/06/2024] dexmedetomidine      [START ON 02/06/2024] heparin  30,000 units/NS 1000 mL solution for CELLSAVER     heparin  1,950 Units/hr (02/04/24 0321)   [START ON 02/06/2024] milrinone      nitroGLYCERIN  5 mcg/min (02/04/24 0321)   [START ON 02/06/2024] nitroGLYCERIN      [START ON 02/06/2024] norepinephrine      [START ON 02/06/2024] tranexamic acid  (CYKLOKAPRON ) 2,500 mg in sodium chloride  0.9 % 250 mL (10 mg/mL) infusion     [START ON 02/06/2024] vancomycin        LOS: 8 days    Time spent: over 30 min    Meliton Monte, MD [facility]   To contact the attending provider between 7A-7P or the covering provider during after hours 7P-7A, please log into the web site [email or web address] and access using universal [place] password for that web site. If you do not have the password, please call the hospital operator.  02/04/2024, 7:57 AM

## 2024-02-04 NOTE — Progress Notes (Signed)
 Rounding Note   Patient Name: Colin Ramirez Date of Encounter: 02/04/2024  [facility] Cardiologist: MARALEE ELSPETH BROCKS, MD   Subjective No complaints  Scheduled Meds:  amLODipine   2.5 mg Oral Daily   artificial tears   Both Eyes Q8H   aspirin  EC  81 mg Oral Daily   atorvastatin   40 mg Oral Daily   [START ON 02/06/2024] epinephrine   0-10 mcg/min Intravenous To OR   furosemide   40 mg Intravenous Daily   furosemide   40 mg Intravenous Once   gabapentin   100 mg Oral BID WC   And   gabapentin   300 mg Oral QHS   [START ON 02/06/2024] heparin  sodium (porcine) 2,500 Units, papaverine  30 mg in electrolyte-A (PLASMALYTE-A PH 7.4) 500 mL irrigation   Irrigation To OR   insulin  aspart  0-15 Units Subcutaneous TID WC   insulin  aspart  0-5 Units Subcutaneous QHS   insulin  aspart  3 Units Subcutaneous TID WC   insulin  glargine-yfgn  20 Units Subcutaneous Daily   [START ON 02/06/2024] insulin    Intravenous To OR   [START ON 02/06/2024] [place] Blood Cardioplegia vial (lidocaine /magnesium /mannitol  0.26g-4g-6.4g)   Intracoronary To OR   levothyroxine   175 mcg Oral Daily   lidocaine   3 patch Transdermal Q24H   pantoprazole   40 mg Oral Daily   [START ON 02/06/2024] phenylephrine   30-200 mcg/min Intravenous To OR   [START ON 02/06/2024] potassium chloride   80 mEq Other To OR   potassium chloride   40 mEq Oral Once   ramipril   10 mg Oral Daily   sodium chloride  flush  3 mL Intravenous Q12H   [START ON 02/06/2024] tranexamic acid   15 mg/kg Intravenous To OR   [START ON 02/06/2024] tranexamic acid   2 mg/kg Intracatheter To OR   Continuous Infusions:  [START ON 02/06/2024]  ceFAZolin  (ANCEF ) IV     [START ON 02/06/2024]  ceFAZolin  (ANCEF ) IV     [START ON 02/06/2024] dexmedetomidine      [START ON 02/06/2024] heparin  30,000 units/NS 1000 mL solution for CELLSAVER     heparin  1,950 Units/hr (02/04/24 0321)   [START ON 02/06/2024] milrinone      nitroGLYCERIN  5 mcg/min (02/04/24 0321)   [START  ON 02/06/2024] nitroGLYCERIN      [START ON 02/06/2024] norepinephrine      [START ON 02/06/2024] tranexamic acid  (CYKLOKAPRON ) 2,500 mg in sodium chloride  0.9 % 250 mL (10 mg/mL) infusion     [START ON 02/06/2024] vancomycin      PRN Meds: acetaminophen  **OR** acetaminophen , fentaNYL  (SUBLIMAZE ) injection, HYDROmorphone , melatonin, Muscle Rub, naLOXone  (NARCAN )  injection, nitroGLYCERIN , ondansetron  (ZOFRAN ) IV, sodium chloride  flush   Vital Signs  Vitals:   02/03/24 1948 02/04/24 0048 02/04/24 0533 02/04/24 0700  BP: 138/79 132/77 (!) 146/82 (!) 156/86  Pulse: (!) 54 (!) 58 (!) 50 (!) 59  Resp: 16 17 11 20   Temp: 97.8 F (36.6 C) 97.9 F (36.6 C) 97.8 F (36.6 C) 97.6 F (36.4 C)  TempSrc: Oral Oral Oral Oral  SpO2: 97% 93% 94% 98%  Weight:   114.3 kg   Height:        Intake/Output Summary (Last 24 hours) at 02/04/2024 0830 Last data filed at 02/04/2024 0321 Gross per 24 hour  Intake 1512.18 ml  Output 1400 ml  Net 112.18 ml      02/04/2024    5:33 AM 02/03/2024    5:23 AM 02/02/2024    4:18 AM  Last 3 Weights  Weight (lbs) 251 lb 15.8 oz 252 lb 13.9 oz 251  lb 6.4 oz  Weight (kg) 114.3 kg 114.7 kg 114.034 kg      Telemetry SR and sinus brady, wenchebach block - Personally Reviewed  ECG  N/a - Personally Reviewed  Physical Exam  GEN: No acute distress.   Neck: No JVD Cardiac: RRR, 2/6 systolic murmur rusb, no jvd Respiratory: Clear to auscultation bilaterally. GI: Soft, nontender, non-distended  MS: 1+ bilateral LE edema Neuro:  Nonfocal  Psych: Normal affect   Labs High Sensitivity Troponin:   Recent Labs  Lab 01/24/24 1557 01/24/24 1803 01/25/24 0511  TROPONINIHS 13 11 10      Chemistry Recent Labs  Lab 02/01/24 2345 02/03/24 0244 02/04/24 0252  NA 137 140 138  K 3.9 4.2 3.9  CL 102 106 107  CO2 25 28 25   GLUCOSE 200* 190* 152*  BUN 17 16 15   CREATININE 0.96 0.84 0.78  CALCIUM  8.7* 8.6* 8.8*  GFRNONAA >60 >60 >60  ANIONGAP 10 6 6     Lipids No  results for input(s): CHOL, TRIG, HDL, LABVLDL, LDLCALC, CHOLHDL in the last 168 hours.  Hematology Recent Labs  Lab 02/01/24 2345 02/03/24 0244 02/04/24 0252  WBC 5.5 6.0 5.9  RBC 4.44 4.11* 4.29  HGB 13.1 12.1* 12.6*  HCT 40.2 38.0* 39.1  MCV 90.5 92.5 91.1  MCH 29.5 29.4 29.4  MCHC 32.6 31.8 32.2  RDW 16.0* 16.1* 16.0*  PLT 233 208 215   Thyroid  No results for input(s): TSH, FREET4 in the last 168 hours.  BNPNo results for input(s): BNP, PROBNP in the last 168 hours.  DDimer No results for input(s): DDIMER in the last 168 hours.   Radiology  No results found.    Assessment & Plan  1.Chest pain/CAD - presented with chest pain, worst with exertion - no objective evidence of ischemia by EKG or enzymes -  R/L heart cath 01/26/24 showed severe LAD stenosis, severe proximal Circumflex stenosis, severe stenosis of distal OM, moderate mid and distal stenosis of RCA. Severe stenosis in the moderate caliber posterolateral artery and severe stenosis in the moderate caliber PDA. Mild elevation of right and left heart pressures (RA mean , RV 33/7 mmHg, PA mean , LVEDP ). Mild to moderate aortic stenosis (14 mm Hg peak to peak gradient, 16 mmHg mean gradient).  - - plan for CABG/AVR/ Bentall procedure by CT surgery, currently scheduled on 8/11. S/p dental extractions   -medical therapy with ASA 81, atorva 40, hep gtt, ramipril  10mg , No beta blocker given bradycardia, wenchebach block   2. Ascending thoracic aortic aneurysm/bicuspid aortic valve stenosis - 12/2023 echo: LVEF 55-60%, no WMAs, mild to mod AS mean grad 14, AVA VTI 1.6 DI 0.37 - 12/2023 CTA 4.9 cm ascending aortic aneurysm  3.Acute HFpEF - - 12/2023 echo: LVEF 55-60%, no WMAs,normal diastolic function - progressive DOE and BLE edema since Sat, weight gain 239 >>252  - received IV lasix  40mg  x 1 yesterday, for bid dosing today. I/Os incomplete. Weight down 1 lbs to 251 lbs. Follow  response to bid IV lasix  today - need to get aggressive getting fluid off prior to surgery Monday, dose IV lasix  60mg  x 2 today and reassess volume status tomorrow   For questions or updates, please contact [facility] Please consult [email or web address] for contact info under     Signed, Alvan Carrier, MD  02/04/2024, 8:30 AM

## 2024-02-04 NOTE — Progress Notes (Signed)
 Mobility Specialist Progress Note;   02/04/24 1034  Mobility  Activity Ambulated independently  Level of Assistance Independent after set-up  Assistive Device Other (Comment) (IV pole)  Distance Ambulated (ft) 800 ft  Activity Response Tolerated well  Mobility Referral Yes  Mobility visit 1 Mobility  Mobility Specialist Start Time (ACUTE ONLY) 1034  Mobility Specialist Stop Time (ACUTE ONLY) 1044  Mobility Specialist Time Calculation (min) (ACUTE ONLY) 10 min   Pt pleasant and eager for mobility. Required no physical assistance during ambulation. No c/o pain when asked. VSS throughout. Pt returned to room and requested to use BR. Pt left with all needs met.   Lauraine Erm Mobility Specialist Please contact via SecureChat or [employer] [phone]

## 2024-02-05 DIAGNOSIS — I200 Unstable angina: Secondary | ICD-10-CM | POA: Diagnosis not present

## 2024-02-05 DIAGNOSIS — R0789 Other chest pain: Secondary | ICD-10-CM | POA: Diagnosis not present

## 2024-02-05 LAB — COMPREHENSIVE METABOLIC PANEL WITH GFR
ALT: 44 U/L (ref 0–44)
AST: 26 U/L (ref 15–41)
Albumin: 3.2 g/dL — ABNORMAL LOW (ref 3.5–5.0)
Alkaline Phosphatase: 44 U/L (ref 38–126)
Anion gap: 10 (ref 5–15)
BUN: 17 mg/dL (ref 6–20)
CO2: 26 mmol/L (ref 22–32)
Calcium: 9.3 mg/dL (ref 8.9–10.3)
Chloride: 103 mmol/L (ref 98–111)
Creatinine, Ser: 1.04 mg/dL (ref 0.61–1.24)
GFR, Estimated: >60 mL/min (ref >60)
Glucose, Bld: 273 mg/dL — ABNORMAL HIGH (ref 70–99)
Potassium: 4 mmol/L (ref 3.5–5.1)
Sodium: 139 mmol/L (ref 135–145)
Total Bilirubin: 0.3 mg/dL (ref 0.0–1.2)
Total Protein: 6.5 g/dL (ref 6.5–8.1)

## 2024-02-05 LAB — CBC
HCT: 40.8 % (ref 39.0–52.0)
Hemoglobin: 13.1 g/dL (ref 13.0–17.0)
MCH: 29.1 pg (ref 26.0–34.0)
MCHC: 32.1 g/dL (ref 30.0–36.0)
MCV: 90.7 fL (ref 80.0–100.0)
Platelets: 214 K/uL (ref 150–400)
RBC: 4.5 MIL/uL (ref 4.22–5.81)
RDW: 16 % — ABNORMAL HIGH (ref 11.5–15.5)
WBC: 6.2 K/uL (ref 4.0–10.5)
nRBC: 0 % (ref 0.0–0.2)

## 2024-02-05 LAB — BASIC METABOLIC PANEL WITH GFR
Anion gap: 10 (ref 5–15)
BUN: 16 mg/dL (ref 6–20)
CO2: 26 mmol/L (ref 22–32)
Calcium: 9.2 mg/dL (ref 8.9–10.3)
Chloride: 103 mmol/L (ref 98–111)
Creatinine, Ser: 0.83 mg/dL (ref 0.61–1.24)
GFR, Estimated: >60 mL/min (ref >60)
Glucose, Bld: 166 mg/dL — ABNORMAL HIGH (ref 70–99)
Potassium: 3.7 mmol/L (ref 3.5–5.1)
Sodium: 139 mmol/L (ref 135–145)

## 2024-02-05 LAB — GLUCOSE, CAPILLARY
Glucose-Capillary: 171 mg/dL — ABNORMAL HIGH (ref 70–99)
Glucose-Capillary: 202 mg/dL — ABNORMAL HIGH (ref 70–99)
Glucose-Capillary: 311 mg/dL — ABNORMAL HIGH (ref 70–99)
Glucose-Capillary: 271 mg/dL — ABNORMAL HIGH (ref 70–99)

## 2024-02-05 LAB — PREPARE RBC (CROSSMATCH)

## 2024-02-05 LAB — BRAIN NATRIURETIC PEPTIDE: B Natriuretic Peptide: 92.1 pg/mL (ref 0.0–100.0)

## 2024-02-05 LAB — HEPARIN LEVEL (UNFRACTIONATED): Heparin Unfractionated: 0.36 [IU]/mL (ref 0.30–0.70)

## 2024-02-05 LAB — SURGICAL PCR SCREEN
MRSA, PCR: NEGATIVE
Staphylococcus aureus: NEGATIVE

## 2024-02-05 LAB — MAGNESIUM: Magnesium: 2.2 mg/dL (ref 1.7–2.4)

## 2024-02-05 LAB — APTT: aPTT: 39 s — ABNORMAL HIGH (ref 24–36)

## 2024-02-05 LAB — PROTIME-INR
INR: 0.9 (ref 0.8–1.2)
Prothrombin Time: 13.2 s (ref 11.4–15.2)

## 2024-02-05 MED ORDER — CHLORHEXIDINE GLUCONATE 0.12 % MT SOLN
15.0000 mL | Freq: Once | OROMUCOSAL | Status: AC
  Administered 2024-02-06 (×2): 15 mL via OROMUCOSAL
  Filled 2024-02-05: qty 15

## 2024-02-05 MED ORDER — BISACODYL 5 MG PO TBEC
5.0000 mg | DELAYED_RELEASE_TABLET | Freq: Once | ORAL | Status: AC
  Administered 2024-02-05: 5 mg via ORAL
  Filled 2024-02-05: qty 1

## 2024-02-05 MED ORDER — METOPROLOL TARTRATE 12.5 MG HALF TABLET
12.5000 mg | ORAL_TABLET | Freq: Once | ORAL | Status: AC
  Administered 2024-02-06 (×2): 12.5 mg via ORAL
  Filled 2024-02-05: qty 1

## 2024-02-05 MED ORDER — TEMAZEPAM 15 MG PO CAPS
15.0000 mg | ORAL_CAPSULE | Freq: Once | ORAL | Status: AC | PRN
  Administered 2024-02-05: 15 mg via ORAL
  Filled 2024-02-05: qty 1

## 2024-02-05 MED ORDER — CHLORHEXIDINE GLUCONATE CLOTH 2 % EX PADS
6.0000 | MEDICATED_PAD | Freq: Once | CUTANEOUS | Status: AC
  Administered 2024-02-05: 6 via TOPICAL

## 2024-02-05 MED ORDER — POTASSIUM CHLORIDE CRYS ER 20 MEQ PO TBCR
40.0000 meq | EXTENDED_RELEASE_TABLET | Freq: Once | ORAL | Status: AC
  Administered 2024-02-05: 40 meq via ORAL
  Filled 2024-02-05: qty 2

## 2024-02-05 MED ORDER — FUROSEMIDE 10 MG/ML IJ SOLN
60.0000 mg | Freq: Once | INTRAMUSCULAR | Status: AC
  Administered 2024-02-05: 60 mg via INTRAVENOUS
  Filled 2024-02-05: qty 6

## 2024-02-05 NOTE — Plan of Care (Signed)
   Problem: Education: Goal: Knowledge of General Education information will improve Description: Including pain rating scale, medication(s)/side effects and non-pharmacologic comfort measures Outcome: Progressing   Problem: Activity: Goal: Risk for activity intolerance will decrease Outcome: Progressing   Problem: Coping: Goal: Level of anxiety will decrease Outcome: Progressing

## 2024-02-05 NOTE — Progress Notes (Signed)
     [address]       [place] [address]             [phone]       OR tomorrow for Bentall, hemiarch, CABG  Larence Thone O Latiana Tomei

## 2024-02-05 NOTE — Progress Notes (Addendum)
 PROGRESS NOTE    Colin Ramirez  FMW:[id number] DOB: June 21, 1966 DOA: 01/24/2024 PCP: [name], MD (Inactive)  Chief Complaint  Patient presents with   Chest Pain    Brief Narrative:   Colin Ramirez is an 58 y.o. male past medical history significant for coronary artery disease, with Colin Ramirez left heart cath on 10/28/2017 that showed two-vessel disease with moderate stenosis, diabetes mellitus type 2, essential hypertension admitted to [facility] when he started having chest pain intermittent nonradiating that started about 6 days prior to admission not associated with shortness of breath or palpitation he does relate recent nausea and vomiting.    Now s/p LHC with multivessel disease.  Plan is for CABG and Bentall procedure.   Assessment & Plan:   Principal Problem:   Atypical chest pain Active Problems:   Primary hypertension   HLD (hyperlipidemia)   GERD (gastroesophageal reflux disease)   DM2 (diabetes mellitus, type 2) (HCC)   Hypokalemia   Unstable angina (HCC)   Aneurysm of ascending aorta without rupture (HCC)   Aortic stenosis with bicuspid valve   Acute heart failure with preserved ejection fraction (HCC)  Unstable angina Mild to moderate AS  Ascending Aortic Aneurysm Continue IV heparin , aspirin , statins.   Left heart cath showed multivessel disease - plan is for CABG and Bentall surgery.  CT angio that showed ascending aortic aneurysm measuring 4.9 cm  S/p extraction tooth #18 on 8/5 Plan for surgery 8/11 Currently on nitroglycerin  gtt  Further management per CV surgery and cardiology   HFpEF Diuresing per cardiology, appreciate assistance - much better today CXR without acute findings   Bilateral hip pain  Sciatica:  Plain films with diffuse degenerative disc disease and facet hypertrophy throughout lumbar spine.  Bilateral hip arthroplasties without complication. Gabapentin  working well  Uncontrolled Diabetes mellitus type 2   Peripheral Neuropathy: Hemoglobin A1c was 7.4 Basal, bolus, SSI Holding all oral hypoglycemic agents Gabapentin  helping with neuropathy, would continue at discharge   Pinkeye unilaterally Hordeolum  Warm compress Started on eyedrops, and erythromycin  ointment.   Essential hypertension: HCTZ was held on admission Amlodipine  (lower dose) ramipril  Coreg  on hold with his bradycardia   Hyperlipidemia: Continue statins.   GERD: Continue PPI   Hypothyroidism Synthroid    Chronic Pain On oral dilaudid  at home for chronic back pain Continue, will add lidocaine  patch  IV fentanyl  ordered for breakthrough   AKI: Resolved  Hypokalemia: resolved.  Morbid obesity with Altheia Shafran BMI greater than 30: Body mass index is 32.57 kg/m.    DVT prophylaxis: heparin  gtt Code Status: full Family Communication: none Disposition:   Status is: Inpatient Remains inpatient appropriate because: need for continued inpatient care   Consultants:  Cardiology Dental  CT surgery  Procedures:  Echo 7/30  IMPRESSIONS     1. Left ventricular ejection fraction, by estimation, is 55 to 60%. The  left ventricle has normal function. The left ventricle has no regional  wall motion abnormalities. Left ventricular diastolic parameters were  normal.   2. Calcified aortic valve. Given age and aortic dilation would be  concerned for bicuspid valve. DVI 0.37. The aortic valve has an  indeterminant number of cusps. Aortic valve regurgitation is not  visualized. Mild to moderate aortic valve stenosis. Aortic   valve area, by VTI measures 1.62 cm. Aortic valve mean gradient measures  14.0 mmHg. Aortic valve Vmax measures 2.48 m/s.   3. Right ventricular systolic function is mildly reduced. The right  ventricular size is  normal.   4. Left atrial size was mildly dilated.   5. The mitral valve is normal in structure. No evidence of mitral valve  regurgitation. No evidence of mitral stenosis.   6. Aortic  dilatation noted. Aneurysm of the ascending aorta, measuring 51  mm.   7. The inferior vena cava is normal in size with greater than 50%  respiratory variability, suggesting right atrial pressure of 3 mmHg.   LHC 7/31  Prox LAD to Mid LAD lesion is 75% stenosed.   Ost 1st Diag to 1st Diag lesion is 30% stenosed.   Ost 2nd Diag to 2nd Diag lesion is 30% stenosed.   Mid RCA to Dist RCA lesion is 50% stenosed.   Prox RCA lesion is 40% stenosed.   RPDA lesion is 90% stenosed.   RPAV lesion is 70% stenosed.   Dist RCA lesion is 50% stenosed.   Prox Cx lesion is 90% stenosed.   Ost 3rd Mrg lesion is 90% stenosed.   Prox LAD lesion is 70% stenosed.   Severe mid LAD stenosis Severe proximal Circumflex stenosis and severe stenosis involving the most distal obtuse marginal branch.  Large dominant RCA with moderate mid and distal stenosis. Severe stenosis in the moderate caliber posterolateral artery and severe stenosis in the moderate caliber PDA.  Mild elevation of right and left heart pressures Mild to moderate aortic stenosis (14 mm Hg peak to peak gradient, 16 mmHg mean gradient).   Recommendations: Will review cath images with the IC team tomorrow. Given multi-vessel CAD, dilated aortic root and moderate aortic stenosis, will need to consider CT surgery consult for Bentall procedure + CABG. We will call the CT surgery team tomorrow given the late timing of the case being finished tonight.   PreCabg US  Summary:  Right Carotid: Velocities in the right ICA are consistent with Egan Berkheimer 1-39%  stenosis.   Left Carotid: Velocities in the left ICA are consistent with Teniqua Marron 1-39%  stenosis.  Vertebrals: Bilateral vertebral arteries demonstrate antegrade flow.  Subclavians: Normal flow hemodynamics were seen in bilateral subclavian               arteries.   Right ABI: Resting right ankle-brachial index is within normal range.  Left ABI: Resting left ankle-brachial index is within normal range.  Right  Upper Extremity: Doppler waveforms remain within normal limits with  right radial compression. Doppler waveforms remain within normal limits  with right ulnar compression.  Left Upper Extremity: Doppler waveforms remain within normal limits with  left radial compression. Doppler waveforms remain within normal limits  with left ulnar compression.     8/5 EXTRACTION tooth # 18.   Antimicrobials:  Anti-infectives (From admission, onward)    Start     Dose/Rate Route Frequency Ordered Stop   02/06/24 0400  Vancomycin  (VANCOCIN ) 1,500 mg in sodium chloride  0.9 % 500 mL IVPB        1,500 mg 250 mL/hr over 120 Minutes Intravenous To Surgery 02/03/24 1445 02/07/24 0400   02/06/24 0400  ceFAZolin  (ANCEF ) IVPB 2g/100 mL premix        2 g 200 mL/hr over 30 Minutes Intravenous To Surgery 02/03/24 1445 02/07/24 0400   02/06/24 0400  ceFAZolin  (ANCEF ) IVPB 2g/100 mL premix        2 g 200 mL/hr over 30 Minutes Intravenous To Surgery 02/03/24 1445 02/07/24 0400   01/31/24 1345  clindamycin  (CLEOCIN ) IVPB 600 mg        600 mg 100 mL/hr over 30  Minutes Intravenous To [facility] 01/30/24 1948 01/31/24 1433       Subjective: No complaints  Objective: Vitals:   02/05/24 0027 02/05/24 0435 02/05/24 0440 02/05/24 0740  BP: 119/77  (!) 145/84 128/84  Pulse: (!) 58 (!) 48 (!) 57 61  Resp: 18 18 14 20   Temp: 97.9 F (36.6 C) 97.8 F (36.6 C) 97.8 F (36.6 C) (!) 97.5 F (36.4 C)  TempSrc: Oral  Oral Oral  SpO2: 96% 95% 93% 95%  Weight:   110.7 kg   Height:        Intake/Output Summary (Last 24 hours) at 02/05/2024 9191 Last data filed at 02/04/2024 2215 Gross per 24 hour  Intake 6 ml  Output --  Net 6 ml   Filed Weights   02/03/24 0523 02/04/24 0533 02/05/24 0440  Weight: 114.7 kg 114.3 kg 110.7 kg    Examination:  General: No acute distress. Lungs: unlabored Neurological: Alert and oriented 3. Moves all extremities 4 with equal strength. Cranial nerves II through XII  grossly intact. Extremities: trace LE edema  Data Reviewed: I have personally reviewed following labs and imaging studies  CBC: Recent Labs  Lab 02/01/24 1035 02/01/24 2345 02/03/24 0244 02/04/24 0252 02/05/24 0253  WBC 6.5 5.5 6.0 5.9 6.2  HGB 14.5 13.1 12.1* 12.6* 13.1  HCT 45.3 40.2 38.0* 39.1 40.8  MCV 91.0 90.5 92.5 91.1 90.7  PLT 232 233 208 215 214    Basic Metabolic Panel: Recent Labs  Lab 02/01/24 1035 02/01/24 2345 02/03/24 0244 02/04/24 0252 02/04/24 0812 02/05/24 0253  NA 137 137 140 138  --  139  K 3.9 3.9 4.2 3.9  --  3.7  CL 103 102 106 107  --  103  CO2 25 25 28 25   --  26  GLUCOSE 166* 200* 190* 152*  --  166*  BUN 16 17 16 15   --  16  CREATININE 0.81 0.96 0.84 0.78  --  0.83  CALCIUM  9.3 8.7* 8.6* 8.8*  --  9.2  MG  --   --   --   --  2.0 2.2    GFR: Estimated Creatinine Clearance: 123.4 mL/min (by C-G formula based on SCr of 0.83 mg/dL).  Liver Function Tests: No results for input(s): AST, ALT, ALKPHOS, BILITOT, PROT, ALBUMIN  in the last 168 hours.  CBG: Recent Labs  Lab 02/04/24 0629 02/04/24 1158 02/04/24 1602 02/04/24 2029 02/05/24 0625  GLUCAP 122* 137* 218* 212* 171*     No results found for this or any previous visit (from the past 240 hours).       Radiology Studies: DG CHEST PORT 1 VIEW Result Date: 02/04/2024 CLINICAL DATA:  Shortness of breath. EXAM: PORTABLE CHEST 1 VIEW COMPARISON:  Radiograph 01/24/2024, CT 01/25/2024 FINDINGS: The cardiomediastinal contours are stable. Hazy left lung base opacity likely soft tissue attenuation, unchanged from prior without effusion or opacity on CT. Pulmonary vasculature is normal. No pneumothorax. No acute osseous abnormalities are seen. IMPRESSION: No acute chest findings. Electronically Signed   By: Andrea Gasman M.D.   On: 02/04/2024 11:09         Scheduled Meds:  amLODipine   2.5 mg Oral Daily   artificial tears   Both Eyes Q8H   aspirin  EC  81 mg Oral  Daily   atorvastatin   40 mg Oral Daily   [START ON 02/06/2024] epinephrine   0-10 mcg/min Intravenous To OR   gabapentin   100 mg Oral BID WC   And  gabapentin   300 mg Oral QHS   [START ON 02/06/2024] heparin  sodium (porcine) 2,500 Units, papaverine  30 mg in electrolyte-Andretta Ergle (PLASMALYTE-Lanaiya Lantry PH 7.4) 500 mL irrigation   Irrigation To OR   insulin  aspart  0-15 Units Subcutaneous TID WC   insulin  aspart  0-5 Units Subcutaneous QHS   insulin  aspart  3 Units Subcutaneous TID WC   insulin  glargine-yfgn  20 Units Subcutaneous Daily   [START ON 02/06/2024] insulin    Intravenous To OR   [START ON 02/06/2024] [place] Blood Cardioplegia vial (lidocaine /magnesium /mannitol  0.26g-4g-6.4g)   Intracoronary To OR   levothyroxine   175 mcg Oral Daily   lidocaine   3 patch Transdermal Q24H   pantoprazole   40 mg Oral Daily   [START ON 02/06/2024] phenylephrine   30-200 mcg/min Intravenous To OR   [START ON 02/06/2024] potassium chloride   80 mEq Other To OR   sodium chloride  flush  3 mL Intravenous Q12H   [START ON 02/06/2024] tranexamic acid   15 mg/kg Intravenous To OR   [START ON 02/06/2024] tranexamic acid   2 mg/kg Intracatheter To OR   Continuous Infusions:  [START ON 02/06/2024]  ceFAZolin  (ANCEF ) IV     [START ON 02/06/2024]  ceFAZolin  (ANCEF ) IV     [START ON 02/06/2024] dexmedetomidine      [START ON 02/06/2024] heparin  30,000 units/NS 1000 mL solution for CELLSAVER     heparin  1,950 Units/hr (02/05/24 0145)   [START ON 02/06/2024] milrinone      nitroGLYCERIN  5 mcg/min (02/05/24 0626)   [START ON 02/06/2024] nitroGLYCERIN      [START ON 02/06/2024] norepinephrine      [START ON 02/06/2024] tranexamic acid  (CYKLOKAPRON ) 2,500 mg in sodium chloride  0.9 % 250 mL (10 mg/mL) infusion     [START ON 02/06/2024] vancomycin        LOS: 9 days    Time spent: over 30 min    Meliton Monte, MD [facility]   To contact the attending provider between 7A-7P or the covering provider during after hours 7P-7A, please  log into the web site [email or web address] and access using universal [place] password for that web site. If you do not have the password, please call the hospital operator.  02/05/2024, 8:08 AM

## 2024-02-05 NOTE — Progress Notes (Signed)
 Rounding Note   Patient Name: Colin Ramirez Date of Encounter: 02/05/2024  [facility] Cardiologist: MARALEE ELSPETH BROCKS, MD   Subjective No complaints  Scheduled Meds:  amLODipine   2.5 mg Oral Daily   artificial tears   Both Eyes Q8H   aspirin  EC  81 mg Oral Daily   atorvastatin   40 mg Oral Daily   [START ON 02/06/2024] epinephrine   0-10 mcg/min Intravenous To OR   gabapentin   100 mg Oral BID WC   And   gabapentin   300 mg Oral QHS   [START ON 02/06/2024] heparin  sodium (porcine) 2,500 Units, papaverine  30 mg in electrolyte-A (PLASMALYTE-A PH 7.4) 500 mL irrigation   Irrigation To OR   insulin  aspart  0-15 Units Subcutaneous TID WC   insulin  aspart  0-5 Units Subcutaneous QHS   insulin  aspart  3 Units Subcutaneous TID WC   insulin  glargine-yfgn  20 Units Subcutaneous Daily   [START ON 02/06/2024] insulin    Intravenous To OR   [START ON 02/06/2024] [facility] Blood Cardioplegia vial (lidocaine /magnesium /mannitol  0.26g-4g-6.4g)   Intracoronary To OR   levothyroxine   175 mcg Oral Daily   lidocaine   3 patch Transdermal Q24H   pantoprazole   40 mg Oral Daily   [START ON 02/06/2024] phenylephrine   30-200 mcg/min Intravenous To OR   [START ON 02/06/2024] potassium chloride   80 mEq Other To OR   sodium chloride  flush  3 mL Intravenous Q12H   [START ON 02/06/2024] tranexamic acid   15 mg/kg Intravenous To OR   [START ON 02/06/2024] tranexamic acid   2 mg/kg Intracatheter To OR   Continuous Infusions:  [START ON 02/06/2024]  ceFAZolin  (ANCEF ) IV     [START ON 02/06/2024]  ceFAZolin  (ANCEF ) IV     [START ON 02/06/2024] dexmedetomidine      [START ON 02/06/2024] heparin  30,000 units/NS 1000 mL solution for CELLSAVER     heparin  1,950 Units/hr (02/05/24 0145)   [START ON 02/06/2024] milrinone      nitroGLYCERIN  5 mcg/min (02/05/24 0626)   [START ON 02/06/2024] nitroGLYCERIN      [START ON 02/06/2024] norepinephrine      [START ON 02/06/2024] tranexamic acid  (CYKLOKAPRON ) 2,500 mg in sodium  chloride 0.9 % 250 mL (10 mg/mL) infusion     [START ON 02/06/2024] vancomycin      PRN Meds: acetaminophen  **OR** acetaminophen , fentaNYL  (SUBLIMAZE ) injection, HYDROmorphone , melatonin, Muscle Rub, naLOXone  (NARCAN )  injection, nitroGLYCERIN , ondansetron  (ZOFRAN ) IV, sodium chloride  flush   Vital Signs  Vitals:   02/05/24 0027 02/05/24 0435 02/05/24 0440 02/05/24 0740  BP: 119/77  (!) 145/84 128/84  Pulse: (!) 58 (!) 48 (!) 57 61  Resp: 18 18 14 20   Temp: 97.9 F (36.6 C) 97.8 F (36.6 C) 97.8 F (36.6 C) (!) 97.5 F (36.4 C)  TempSrc: Oral  Oral Oral  SpO2: 96% 95% 93% 95%  Weight:   110.7 kg   Height:        Intake/Output Summary (Last 24 hours) at 02/05/2024 0829 Last data filed at 02/04/2024 2215 Gross per 24 hour  Intake 6 ml  Output --  Net 6 ml      02/05/2024    4:40 AM 02/04/2024    5:33 AM 02/03/2024    5:23 AM  Last 3 Weights  Weight (lbs) 244 lb 0.8 oz 251 lb 15.8 oz 252 lb 13.9 oz  Weight (kg) 110.7 kg 114.3 kg 114.7 kg      Telemetry Sinus brady, wenchebach block, blocked PACs - Personally Reviewed  ECG  N/a - Personally Reviewed  Physical Exam  GEN: No acute distress.   Neck: No JVD Cardiac: RRR, 3/6 systolic murmur rus Respiratory: Clear to auscultation bilaterally. GI: Soft, nontender, non-distended  MS: trace bilateral edema Neuro:  Nonfocal  Psych: Normal affect   Labs High Sensitivity Troponin:   Recent Labs  Lab 01/24/24 1557 01/24/24 1803 01/25/24 0511  TROPONINIHS 13 11 10      Chemistry Recent Labs  Lab 02/03/24 0244 02/04/24 0252 02/04/24 0812 02/05/24 0253  NA 140 138  --  139  K 4.2 3.9  --  3.7  CL 106 107  --  103  CO2 28 25  --  26  GLUCOSE 190* 152*  --  166*  BUN 16 15  --  16  CREATININE 0.84 0.78  --  0.83  CALCIUM  8.6* 8.8*  --  9.2  MG  --   --  2.0 2.2  GFRNONAA >60 >60  --  >60  ANIONGAP 6 6  --  10    Lipids No results for input(s): CHOL, TRIG, HDL, LABVLDL, LDLCALC, CHOLHDL in the last 168  hours.  Hematology Recent Labs  Lab 02/03/24 0244 02/04/24 0252 02/05/24 0253  WBC 6.0 5.9 6.2  RBC 4.11* 4.29 4.50  HGB 12.1* 12.6* 13.1  HCT 38.0* 39.1 40.8  MCV 92.5 91.1 90.7  MCH 29.4 29.4 29.1  MCHC 31.8 32.2 32.1  RDW 16.1* 16.0* 16.0*  PLT 208 215 214   Thyroid  No results for input(s): TSH, FREET4 in the last 168 hours.  BNP Recent Labs  Lab 02/05/24 0253  BNP 92.1    DDimer No results for input(s): DDIMER in the last 168 hours.   Radiology  DG CHEST PORT 1 VIEW Result Date: 02/04/2024 CLINICAL DATA:  Shortness of breath. EXAM: PORTABLE CHEST 1 VIEW COMPARISON:  Radiograph 01/24/2024, CT 01/25/2024 FINDINGS: The cardiomediastinal contours are stable. Hazy left lung base opacity likely soft tissue attenuation, unchanged from prior without effusion or opacity on CT. Pulmonary vasculature is normal. No pneumothorax. No acute osseous abnormalities are seen. IMPRESSION: No acute chest findings. Electronically Signed   By: Andrea Gasman M.D.   On: 02/04/2024 11:09     Assessment & Plan   1.Chest pain/CAD - presented with chest pain, worst with exertion - no objective evidence of ischemia by EKG or enzymes -  R/L heart cath 01/26/24 showed severe LAD stenosis, severe proximal Circumflex stenosis, severe stenosis of distal OM, moderate mid and distal stenosis of RCA. Severe stenosis in the moderate caliber posterolateral artery and severe stenosis in the moderate caliber PDA. Mild elevation of right and left heart pressures (RA mean , RV 33/7 mmHg, PA mean , LVEDP ). Mild to moderate aortic stenosis (14 mm Hg peak to peak gradient, 16 mmHg mean gradient).  - - plan for CABG/AVR/ Bentall procedure by CT surgery, currently scheduled on 8/11. S/p dental extractions    -medical therapy with ASA 81, atorva 40, hep gtt No beta blocker given bradycardia, wenchebach block     2. Ascending thoracic aortic aneurysm/bicuspid aortic valve stenosis - 12/2023  echo: LVEF 55-60%, no WMAs, mild to mod AS mean grad 14, AVA VTI 1.6 DI 0.37 - 12/2023 CTA 4.9 cm ascending aortic aneurysm   3.Acute HFpEF - - 12/2023 echo: LVEF 55-60%, no WMAs,normal diastolic function - progressive DOE and BLE edema since Sat, weight gain 239 >>252 during admission  - received IV lasix  60mg  x 2 yesterday. I/Os incomplete. Wt 251-->244 lbs. Still some ongoing fluid overload, will  dose IV lasix  60mg  x 1 today.    4.Bradycardia - sinus brady, wenchebach block, blocked PACs. Do not see evidence of higher degree block - avoid av nodal agents.       For questions or updates, please contact  [facility] Please consult [email or web address] for contact info under     Signed, Alvan Carrier, MD  02/05/2024, 8:29 AM

## 2024-02-05 NOTE — Plan of Care (Signed)
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
## 2024-02-06 ENCOUNTER — Other Ambulatory Visit: Payer: Self-pay

## 2024-02-06 ENCOUNTER — Inpatient Hospital Stay (HOSPITAL_COMMUNITY): Payer: PRIVATE HEALTH INSURANCE

## 2024-02-06 ENCOUNTER — Encounter (HOSPITAL_COMMUNITY): Payer: Self-pay | Admitting: Internal Medicine

## 2024-02-06 ENCOUNTER — Inpatient Hospital Stay (HOSPITAL_COMMUNITY)
Admission: EM | Disposition: A | Discharge status: Home / Self Care | Payer: Self-pay | Source: Home / Self Care | Attending: Internal Medicine

## 2024-02-06 DIAGNOSIS — F17210 Nicotine dependence, cigarettes, uncomplicated: Secondary | ICD-10-CM | POA: Diagnosis not present

## 2024-02-06 DIAGNOSIS — Z9911 Dependence on respirator [ventilator] status: Secondary | ICD-10-CM

## 2024-02-06 DIAGNOSIS — Z951 Presence of aortocoronary bypass graft: Secondary | ICD-10-CM

## 2024-02-06 DIAGNOSIS — I200 Unstable angina: Secondary | ICD-10-CM

## 2024-02-06 DIAGNOSIS — I2510 Atherosclerotic heart disease of native coronary artery without angina pectoris: Secondary | ICD-10-CM

## 2024-02-06 DIAGNOSIS — F17200 Nicotine dependence, unspecified, uncomplicated: Secondary | ICD-10-CM | POA: Diagnosis not present

## 2024-02-06 DIAGNOSIS — I25110 Atherosclerotic heart disease of native coronary artery with unstable angina pectoris: Secondary | ICD-10-CM

## 2024-02-06 DIAGNOSIS — I350 Nonrheumatic aortic (valve) stenosis: Secondary | ICD-10-CM

## 2024-02-06 DIAGNOSIS — K219 Gastro-esophageal reflux disease without esophagitis: Secondary | ICD-10-CM | POA: Diagnosis not present

## 2024-02-06 DIAGNOSIS — Q2381 Bicuspid aortic valve: Secondary | ICD-10-CM

## 2024-02-06 DIAGNOSIS — E119 Type 2 diabetes mellitus without complications: Secondary | ICD-10-CM | POA: Diagnosis not present

## 2024-02-06 DIAGNOSIS — I7121 Aneurysm of the ascending aorta, without rupture: Secondary | ICD-10-CM

## 2024-02-06 DIAGNOSIS — Z95828 Presence of other vascular implants and grafts: Secondary | ICD-10-CM

## 2024-02-06 HISTORY — PX: BENTALL PROCEDURE: SHX5058

## 2024-02-06 HISTORY — PX: CORONARY ARTERY BYPASS GRAFT: SHX141

## 2024-02-06 HISTORY — PX: INTRAOPERATIVE TRANSESOPHAGEAL ECHOCARDIOGRAM: SHX5062

## 2024-02-06 LAB — POCT I-STAT 7, (LYTES, BLD GAS, ICA,H+H)
Acid-base deficit: 1 mmol/L (ref 0.0–2.0)
Acid-base deficit: 2 mmol/L (ref 0.0–2.0)
Acid-base deficit: 8 mmol/L — ABNORMAL HIGH (ref 0.0–2.0)
Acid-base deficit: 2 mmol/L (ref 0.0–2.0)
Acid-base deficit: 4 mmol/L — ABNORMAL HIGH (ref 0.0–2.0)
Acid-base deficit: 2 mmol/L (ref 0.0–2.0)
Acid-base deficit: 3 mmol/L — ABNORMAL HIGH (ref 0.0–2.0)
Acid-base deficit: 4 mmol/L — ABNORMAL HIGH (ref 0.0–2.0)
Acid-base deficit: 5 mmol/L — ABNORMAL HIGH (ref 0.0–2.0)
Bicarbonate: 25.5 mmol/L (ref 20.0–28.0)
Bicarbonate: 24.4 mmol/L (ref 20.0–28.0)
Bicarbonate: 18 mmol/L — ABNORMAL LOW (ref 20.0–28.0)
Bicarbonate: 21.1 mmol/L (ref 20.0–28.0)
Bicarbonate: 24 mmol/L (ref 20.0–28.0)
Bicarbonate: 24.4 mmol/L (ref 20.0–28.0)
Bicarbonate: 24.6 mmol/L (ref 20.0–28.0)
Bicarbonate: 21.6 mmol/L (ref 20.0–28.0)
Bicarbonate: 20.9 mmol/L (ref 20.0–28.0)
Calcium, Ion: 1.25 mmol/L (ref 1.15–1.40)
Calcium, Ion: 1.12 mmol/L — ABNORMAL LOW (ref 1.15–1.40)
Calcium, Ion: 1.12 mmol/L — ABNORMAL LOW (ref 1.15–1.40)
Calcium, Ion: 1.13 mmol/L — ABNORMAL LOW (ref 1.15–1.40)
Calcium, Ion: 1.13 mmol/L — ABNORMAL LOW (ref 1.15–1.40)
Calcium, Ion: 1.13 mmol/L — ABNORMAL LOW (ref 1.15–1.40)
Calcium, Ion: 1.28 mmol/L (ref 1.15–1.40)
Calcium, Ion: 1.22 mmol/L (ref 1.15–1.40)
Calcium, Ion: 1.24 mmol/L (ref 1.15–1.40)
HCT: 37 % — ABNORMAL LOW (ref 39.0–52.0)
HCT: 31 % — ABNORMAL LOW (ref 39.0–52.0)
HCT: 30 % — ABNORMAL LOW (ref 39.0–52.0)
HCT: 30 % — ABNORMAL LOW (ref 39.0–52.0)
HCT: 32 % — ABNORMAL LOW (ref 39.0–52.0)
HCT: 27 % — ABNORMAL LOW (ref 39.0–52.0)
HCT: 30 % — ABNORMAL LOW (ref 39.0–52.0)
HCT: 31 % — ABNORMAL LOW (ref 39.0–52.0)
HCT: 31 % — ABNORMAL LOW (ref 39.0–52.0)
Hemoglobin: 12.6 g/dL — ABNORMAL LOW (ref 13.0–17.0)
Hemoglobin: 10.5 g/dL — ABNORMAL LOW (ref 13.0–17.0)
Hemoglobin: 10.2 g/dL — ABNORMAL LOW (ref 13.0–17.0)
Hemoglobin: 10.2 g/dL — ABNORMAL LOW (ref 13.0–17.0)
Hemoglobin: 10.9 g/dL — ABNORMAL LOW (ref 13.0–17.0)
Hemoglobin: 9.2 g/dL — ABNORMAL LOW (ref 13.0–17.0)
Hemoglobin: 10.2 g/dL — ABNORMAL LOW (ref 13.0–17.0)
Hemoglobin: 10.5 g/dL — ABNORMAL LOW (ref 13.0–17.0)
Hemoglobin: 10.5 g/dL — ABNORMAL LOW (ref 13.0–17.0)
O2 Saturation: 99 %
O2 Saturation: 100 %
O2 Saturation: 100 %
O2 Saturation: 100 %
O2 Saturation: 100 %
O2 Saturation: 99 %
O2 Saturation: 90 %
O2 Saturation: 96 %
O2 Saturation: 93 %
Patient temperature: 37.2
Patient temperature: 37.5
Patient temperature: 37.5
Potassium: 3.8 mmol/L (ref 3.5–5.1)
Potassium: 4.1 mmol/L (ref 3.5–5.1)
Potassium: 3.1 mmol/L — ABNORMAL LOW (ref 3.5–5.1)
Potassium: 3.6 mmol/L (ref 3.5–5.1)
Potassium: 4.7 mmol/L (ref 3.5–5.1)
Potassium: 3.9 mmol/L (ref 3.5–5.1)
Potassium: 4 mmol/L (ref 3.5–5.1)
Potassium: 4.2 mmol/L (ref 3.5–5.1)
Potassium: 4.2 mmol/L (ref 3.5–5.1)
Sodium: 142 mmol/L (ref 135–145)
Sodium: 141 mmol/L (ref 135–145)
Sodium: 141 mmol/L (ref 135–145)
Sodium: 141 mmol/L (ref 135–145)
Sodium: 141 mmol/L (ref 135–145)
Sodium: 142 mmol/L (ref 135–145)
Sodium: 143 mmol/L (ref 135–145)
Sodium: 143 mmol/L (ref 135–145)
Sodium: 143 mmol/L (ref 135–145)
TCO2: 27 mmol/L (ref 22–32)
TCO2: 26 mmol/L (ref 22–32)
TCO2: 19 mmol/L — ABNORMAL LOW (ref 22–32)
TCO2: 22 mmol/L (ref 22–32)
TCO2: 25 mmol/L (ref 22–32)
TCO2: 26 mmol/L (ref 22–32)
TCO2: 26 mmol/L (ref 22–32)
TCO2: 23 mmol/L (ref 22–32)
TCO2: 22 mmol/L (ref 22–32)
pCO2 arterial: 49.8 mmHg — ABNORMAL HIGH (ref 32–48)
pCO2 arterial: 50.3 mmHg — ABNORMAL HIGH (ref 32–48)
pCO2 arterial: 37.2 mmHg (ref 32–48)
pCO2 arterial: 36 mmHg (ref 32–48)
pCO2 arterial: 45.7 mmHg (ref 32–48)
pCO2 arterial: 51.5 mmHg — ABNORMAL HIGH (ref 32–48)
pCO2 arterial: 55.5 mmHg — ABNORMAL HIGH (ref 32–48)
pCO2 arterial: 40.4 mmHg (ref 32–48)
pCO2 arterial: 40.3 mmHg (ref 32–48)
pH, Arterial: 7.318 — ABNORMAL LOW (ref 7.35–7.45)
pH, Arterial: 7.294 — ABNORMAL LOW (ref 7.35–7.45)
pH, Arterial: 7.292 — ABNORMAL LOW (ref 7.35–7.45)
pH, Arterial: 7.329 — ABNORMAL LOW (ref 7.35–7.45)
pH, Arterial: 7.376 (ref 7.35–7.45)
pH, Arterial: 7.283 — ABNORMAL LOW (ref 7.35–7.45)
pH, Arterial: 7.255 — ABNORMAL LOW (ref 7.35–7.45)
pH, Arterial: 7.338 — ABNORMAL LOW (ref 7.35–7.45)
pH, Arterial: 7.325 — ABNORMAL LOW (ref 7.35–7.45)
pO2, Arterial: 178 mmHg — ABNORMAL HIGH (ref 83–108)
pO2, Arterial: 254 mmHg — ABNORMAL HIGH (ref 83–108)
pO2, Arterial: 425 mmHg — ABNORMAL HIGH (ref 83–108)
pO2, Arterial: 332 mmHg — ABNORMAL HIGH (ref 83–108)
pO2, Arterial: 333 mmHg — ABNORMAL HIGH (ref 83–108)
pO2, Arterial: 162 mmHg — ABNORMAL HIGH (ref 83–108)
pO2, Arterial: 70 mmHg — ABNORMAL LOW (ref 83–108)
pO2, Arterial: 90 mmHg (ref 83–108)
pO2, Arterial: 76 mmHg — ABNORMAL LOW (ref 83–108)

## 2024-02-06 LAB — POCT I-STAT, CHEM 8
BUN: 21 mg/dL — ABNORMAL HIGH (ref 6–20)
BUN: 21 mg/dL — ABNORMAL HIGH (ref 6–20)
BUN: 20 mg/dL (ref 6–20)
BUN: 23 mg/dL — ABNORMAL HIGH (ref 6–20)
BUN: 25 mg/dL — ABNORMAL HIGH (ref 6–20)
BUN: 27 mg/dL — ABNORMAL HIGH (ref 6–20)
BUN: 27 mg/dL — ABNORMAL HIGH (ref 6–20)
BUN: 25 mg/dL — ABNORMAL HIGH (ref 6–20)
Calcium, Ion: 1.3 mmol/L (ref 1.15–1.40)
Calcium, Ion: 1.26 mmol/L (ref 1.15–1.40)
Calcium, Ion: 1.18 mmol/L (ref 1.15–1.40)
Calcium, Ion: 1.13 mmol/L — ABNORMAL LOW (ref 1.15–1.40)
Calcium, Ion: 1.13 mmol/L — ABNORMAL LOW (ref 1.15–1.40)
Calcium, Ion: 1.13 mmol/L — ABNORMAL LOW (ref 1.15–1.40)
Calcium, Ion: 1.22 mmol/L (ref 1.15–1.40)
Calcium, Ion: 1.12 mmol/L — ABNORMAL LOW (ref 1.15–1.40)
Chloride: 105 mmol/L (ref 98–111)
Chloride: 106 mmol/L (ref 98–111)
Chloride: 106 mmol/L (ref 98–111)
Chloride: 107 mmol/L (ref 98–111)
Chloride: 106 mmol/L (ref 98–111)
Chloride: 106 mmol/L (ref 98–111)
Chloride: 107 mmol/L (ref 98–111)
Chloride: 107 mmol/L (ref 98–111)
Creatinine, Ser: 0.7 mg/dL (ref 0.61–1.24)
Creatinine, Ser: 0.7 mg/dL (ref 0.61–1.24)
Creatinine, Ser: 0.7 mg/dL (ref 0.61–1.24)
Creatinine, Ser: 0.8 mg/dL (ref 0.61–1.24)
Creatinine, Ser: 0.8 mg/dL (ref 0.61–1.24)
Creatinine, Ser: 0.8 mg/dL (ref 0.61–1.24)
Creatinine, Ser: 0.9 mg/dL (ref 0.61–1.24)
Creatinine, Ser: 0.9 mg/dL (ref 0.61–1.24)
Glucose, Bld: 148 mg/dL — ABNORMAL HIGH (ref 70–99)
Glucose, Bld: 132 mg/dL — ABNORMAL HIGH (ref 70–99)
Glucose, Bld: 160 mg/dL — ABNORMAL HIGH (ref 70–99)
Glucose, Bld: 293 mg/dL — ABNORMAL HIGH (ref 70–99)
Glucose, Bld: 279 mg/dL — ABNORMAL HIGH (ref 70–99)
Glucose, Bld: 215 mg/dL — ABNORMAL HIGH (ref 70–99)
Glucose, Bld: 217 mg/dL — ABNORMAL HIGH (ref 70–99)
Glucose, Bld: 177 mg/dL — ABNORMAL HIGH (ref 70–99)
HCT: 38 % — ABNORMAL LOW (ref 39.0–52.0)
HCT: 40 % (ref 39.0–52.0)
HCT: 33 % — ABNORMAL LOW (ref 39.0–52.0)
HCT: 31 % — ABNORMAL LOW (ref 39.0–52.0)
HCT: 32 % — ABNORMAL LOW (ref 39.0–52.0)
HCT: 30 % — ABNORMAL LOW (ref 39.0–52.0)
HCT: 31 % — ABNORMAL LOW (ref 39.0–52.0)
HCT: 30 % — ABNORMAL LOW (ref 39.0–52.0)
Hemoglobin: 12.9 g/dL — ABNORMAL LOW (ref 13.0–17.0)
Hemoglobin: 13.6 g/dL (ref 13.0–17.0)
Hemoglobin: 11.2 g/dL — ABNORMAL LOW (ref 13.0–17.0)
Hemoglobin: 10.5 g/dL — ABNORMAL LOW (ref 13.0–17.0)
Hemoglobin: 10.9 g/dL — ABNORMAL LOW (ref 13.0–17.0)
Hemoglobin: 10.2 g/dL — ABNORMAL LOW (ref 13.0–17.0)
Hemoglobin: 10.5 g/dL — ABNORMAL LOW (ref 13.0–17.0)
Hemoglobin: 10.2 g/dL — ABNORMAL LOW (ref 13.0–17.0)
Potassium: 3.8 mmol/L (ref 3.5–5.1)
Potassium: 3.9 mmol/L (ref 3.5–5.1)
Potassium: 4.2 mmol/L (ref 3.5–5.1)
Potassium: 3.1 mmol/L — ABNORMAL LOW (ref 3.5–5.1)
Potassium: 3.7 mmol/L (ref 3.5–5.1)
Potassium: 4.7 mmol/L (ref 3.5–5.1)
Potassium: 4.7 mmol/L (ref 3.5–5.1)
Potassium: 3.9 mmol/L (ref 3.5–5.1)
Sodium: 142 mmol/L (ref 135–145)
Sodium: 140 mmol/L (ref 135–145)
Sodium: 142 mmol/L (ref 135–145)
Sodium: 142 mmol/L (ref 135–145)
Sodium: 141 mmol/L (ref 135–145)
Sodium: 140 mmol/L (ref 135–145)
Sodium: 140 mmol/L (ref 135–145)
Sodium: 143 mmol/L (ref 135–145)
TCO2: 25 mmol/L (ref 22–32)
TCO2: 25 mmol/L (ref 22–32)
TCO2: 26 mmol/L (ref 22–32)
TCO2: 20 mmol/L — ABNORMAL LOW (ref 22–32)
TCO2: 23 mmol/L (ref 22–32)
TCO2: 24 mmol/L (ref 22–32)
TCO2: 25 mmol/L (ref 22–32)
TCO2: 24 mmol/L (ref 22–32)

## 2024-02-06 LAB — POCT I-STAT EG7
Acid-base deficit: 2 mmol/L (ref 0.0–2.0)
Bicarbonate: 24.9 mmol/L (ref 20.0–28.0)
Calcium, Ion: 1.1 mmol/L — ABNORMAL LOW (ref 1.15–1.40)
HCT: 31 % — ABNORMAL LOW (ref 39.0–52.0)
Hemoglobin: 10.5 g/dL — ABNORMAL LOW (ref 13.0–17.0)
O2 Saturation: 76 %
Potassium: 4 mmol/L (ref 3.5–5.1)
Sodium: 141 mmol/L (ref 135–145)
TCO2: 27 mmol/L (ref 22–32)
pCO2, Ven: 54.9 mmHg (ref 44–60)
pH, Ven: 7.265 (ref 7.25–7.43)
pO2, Ven: 47 mmHg — ABNORMAL HIGH (ref 32–45)

## 2024-02-06 LAB — URINALYSIS, COMPLETE (UACMP) WITH MICROSCOPIC
Bacteria, UA: NONE SEEN
Bilirubin Urine: NEGATIVE
Glucose, UA: >=500 mg/dL — AB
Hgb urine dipstick: NEGATIVE
Ketones, ur: NEGATIVE mg/dL
Nitrite: NEGATIVE
Protein, ur: NEGATIVE mg/dL
Specific Gravity, Urine: 1.029 (ref 1.005–1.030)
pH: 5 (ref 5.0–8.0)

## 2024-02-06 LAB — CBC
HCT: 35.2 % — ABNORMAL LOW (ref 39.0–52.0)
Hemoglobin: 11.3 g/dL — ABNORMAL LOW (ref 13.0–17.0)
MCH: 30 pg (ref 26.0–34.0)
MCHC: 32.1 g/dL (ref 30.0–36.0)
MCV: 93.4 fL (ref 80.0–100.0)
Platelets: 162 K/uL (ref 150–400)
RBC: 3.77 MIL/uL — ABNORMAL LOW (ref 4.22–5.81)
RDW: 15.9 % — ABNORMAL HIGH (ref 11.5–15.5)
WBC: 13.7 K/uL — ABNORMAL HIGH (ref 4.0–10.5)
nRBC: 0 % (ref 0.0–0.2)

## 2024-02-06 LAB — HEMOGLOBIN AND HEMATOCRIT, BLOOD
HCT: 34.4 % — ABNORMAL LOW (ref 39.0–52.0)
Hemoglobin: 10.9 g/dL — ABNORMAL LOW (ref 13.0–17.0)

## 2024-02-06 LAB — PREPARE CRYOPRECIPITATE: Unit division: 0

## 2024-02-06 LAB — PROTIME-INR
INR: 1.5 — ABNORMAL HIGH (ref 0.8–1.2)
Prothrombin Time: 18.5 s — ABNORMAL HIGH (ref 11.4–15.2)

## 2024-02-06 LAB — ECHO INTRAOPERATIVE TEE
AR max vel: 4.13 cm2
AV Area VTI: 4.6 cm2
AV Area mean vel: 4.26 cm2
AV Mean grad: 7.5 mmHg
AV Peak grad: 11.7 mmHg
Ao pk vel: 1.71 m/s
Height: 73 in
S' Lateral: 2.7 cm
Weight: 3940.06 [oz_av]

## 2024-02-06 LAB — PREPARE RBC (CROSSMATCH)

## 2024-02-06 LAB — BPAM CRYOPRECIPITATE
Blood Product Expiration Date: 202508162359
ISSUE DATE / TIME: 202508111557
Unit Type and Rh: 6200

## 2024-02-06 LAB — GLUCOSE, CAPILLARY
Glucose-Capillary: 238 mg/dL — ABNORMAL HIGH (ref 70–99)
Glucose-Capillary: 154 mg/dL — ABNORMAL HIGH (ref 70–99)
Glucose-Capillary: 145 mg/dL — ABNORMAL HIGH (ref 70–99)
Glucose-Capillary: 152 mg/dL — ABNORMAL HIGH (ref 70–99)
Glucose-Capillary: 144 mg/dL — ABNORMAL HIGH (ref 70–99)

## 2024-02-06 LAB — PLATELET COUNT: Platelets: 157 K/uL (ref 150–400)

## 2024-02-06 LAB — FIBRINOGEN
Fibrinogen: 298 mg/dL (ref 210–475)
Fibrinogen: 263 mg/dL (ref 210–475)

## 2024-02-06 LAB — HEMOGLOBIN A1C
Hgb A1c MFr Bld: 7.6 % — ABNORMAL HIGH (ref 4.8–5.6)
Mean Plasma Glucose: 171 mg/dL

## 2024-02-06 LAB — HEPARIN LEVEL (UNFRACTIONATED): Heparin Unfractionated: >1.1 [IU]/mL — ABNORMAL HIGH (ref 0.30–0.70)

## 2024-02-06 LAB — APTT: aPTT: 35 s (ref 24–36)

## 2024-02-06 SURGERY — CORONARY ARTERY BYPASS GRAFTING (CABG)
Anesthesia: General | Site: Chest

## 2024-02-06 MED ORDER — BISACODYL 10 MG RE SUPP: 10.0000 mg | Freq: Every day | RECTAL | Status: DC

## 2024-02-06 MED ORDER — PROTAMINE SULFATE 10 MG/ML IV SOLN
INTRAVENOUS | Status: AC
  Filled 2024-02-06: qty 5

## 2024-02-06 MED ORDER — METOPROLOL TARTRATE 12.5 MG HALF TABLET: 12.5000 mg | ORAL_TABLET | Freq: Two times a day (BID) | ORAL | Status: DC

## 2024-02-06 MED ORDER — HEPARIN SODIUM (PORCINE) 1000 UNIT/ML IJ SOLN
INTRAMUSCULAR | Status: DC | PRN
  Administered 2024-02-06 (×2): 40000 [IU] via INTRAVENOUS

## 2024-02-06 MED ORDER — TRANEXAMIC ACID 1000 MG/10ML IV SOLN
1.5000 mg/kg/h | INTRAVENOUS | Status: DC
  Filled 2024-02-06: qty 25

## 2024-02-06 MED ORDER — LACTATED RINGERS IV SOLN: INTRAVENOUS | Status: DC | PRN

## 2024-02-06 MED ORDER — OXYCODONE HCL 5 MG PO TABS
5.0000 mg | ORAL_TABLET | ORAL | Status: DC | PRN
  Administered 2024-02-06 (×2): 5 mg via ORAL
  Administered 2024-02-07 (×4): 10 mg via ORAL
  Filled 2024-02-06 (×2): qty 2
  Filled 2024-02-06: qty 1

## 2024-02-06 MED ORDER — DEXMEDETOMIDINE HCL IN NACL 400 MCG/100ML IV SOLN
0.0000 ug/kg/h | INTRAVENOUS | Status: DC
  Administered 2024-02-06: 0.7 ug/kg/h via INTRAVENOUS
  Administered 2024-02-06 (×2): 0.6 ug/kg/h via INTRAVENOUS
  Administered 2024-02-06: 0.7 ug/kg/h via INTRAVENOUS
  Filled 2024-02-06: qty 100

## 2024-02-06 MED ORDER — HEPARIN SODIUM (PORCINE) 1000 UNIT/ML IJ SOLN
INTRAMUSCULAR | Status: AC
  Filled 2024-02-06: qty 10

## 2024-02-06 MED ORDER — ROCURONIUM BROMIDE 10 MG/ML (PF) SYRINGE
PREFILLED_SYRINGE | INTRAVENOUS | Status: AC
  Filled 2024-02-06: qty 10

## 2024-02-06 MED ORDER — PROPOFOL 10 MG/ML IV BOLUS
INTRAVENOUS | Status: AC
  Filled 2024-02-06: qty 20

## 2024-02-06 MED ORDER — FENTANYL CITRATE (PF) 100 MCG/2ML IJ SOLN
INTRAMUSCULAR | Status: AC
  Filled 2024-02-06: qty 10

## 2024-02-06 MED ORDER — NICARDIPINE HCL IN NACL 20-0.86 MG/200ML-% IV SOLN: 0.0000 mg/h | INTRAVENOUS | Status: DC

## 2024-02-06 MED ORDER — BISACODYL 5 MG PO TBEC
10.0000 mg | DELAYED_RELEASE_TABLET | Freq: Every day | ORAL | Status: DC
  Administered 2024-02-07 – 2024-02-09 (×5): 10 mg via ORAL
  Filled 2024-02-06 (×3): qty 2

## 2024-02-06 MED ORDER — LIDOCAINE 2% (20 MG/ML) 5 ML SYRINGE
INTRAMUSCULAR | Status: AC
  Filled 2024-02-06: qty 5

## 2024-02-06 MED ORDER — PAPAVERINE HCL 30 MG/ML IJ SOLN
INTRAMUSCULAR | Status: DC | PRN
  Administered 2024-02-06 (×2): 500 mL via INTRAVASCULAR

## 2024-02-06 MED ORDER — SODIUM CHLORIDE 0.9% FLUSH
3.0000 mL | Freq: Two times a day (BID) | INTRAVENOUS | Status: DC
  Administered 2024-02-07 – 2024-02-10 (×11): 3 mL via INTRAVENOUS

## 2024-02-06 MED ORDER — INSULIN REGULAR(HUMAN) IN NACL 100-0.9 UT/100ML-% IV SOLN
INTRAVENOUS | Status: DC
  Filled 2024-02-06: qty 100

## 2024-02-06 MED ORDER — MAGNESIUM SULFATE 4 GM/100ML IV SOLN
4.0000 g | Freq: Once | INTRAVENOUS | Status: AC
  Administered 2024-02-06 (×2): 4 g via INTRAVENOUS
  Filled 2024-02-06: qty 100

## 2024-02-06 MED ORDER — TRAMADOL HCL 50 MG PO TABS
50.0000 mg | ORAL_TABLET | ORAL | Status: DC | PRN
  Administered 2024-02-07 – 2024-02-09 (×9): 100 mg via ORAL
  Filled 2024-02-06 (×5): qty 2

## 2024-02-06 MED ORDER — PHENYLEPHRINE 80 MCG/ML (10ML) SYRINGE FOR IV PUSH (FOR BLOOD PRESSURE SUPPORT)
PREFILLED_SYRINGE | INTRAVENOUS | Status: AC
  Filled 2024-02-06: qty 10

## 2024-02-06 MED ORDER — ROCURONIUM BROMIDE 10 MG/ML (PF) SYRINGE
PREFILLED_SYRINGE | INTRAVENOUS | Status: DC | PRN
  Administered 2024-02-06: 70 mg via INTRAVENOUS
  Administered 2024-02-06: 40 mg via INTRAVENOUS
  Administered 2024-02-06: 15 mg via INTRAVENOUS
  Administered 2024-02-06: 70 mg via INTRAVENOUS
  Administered 2024-02-06: 80 mg via INTRAVENOUS
  Administered 2024-02-06 (×3): 30 mg via INTRAVENOUS
  Administered 2024-02-06: 20 mg via INTRAVENOUS
  Administered 2024-02-06: 40 mg via INTRAVENOUS
  Administered 2024-02-06: 80 mg via INTRAVENOUS
  Administered 2024-02-06: 30 mg via INTRAVENOUS
  Administered 2024-02-06: 20 mg via INTRAVENOUS
  Administered 2024-02-06: 15 mg via INTRAVENOUS

## 2024-02-06 MED ORDER — DEXTROSE 50 % IV SOLN: 0.0000 mL | INTRAVENOUS | Status: DC | PRN

## 2024-02-06 MED ORDER — SODIUM CHLORIDE 0.9% IV SOLUTION: Freq: Once | INTRAVENOUS | Status: DC

## 2024-02-06 MED ORDER — FENTANYL CITRATE (PF) 250 MCG/5ML IJ SOLN
INTRAMUSCULAR | Status: AC
  Filled 2024-02-06: qty 5

## 2024-02-06 MED ORDER — DOCUSATE SODIUM 100 MG PO CAPS
200.0000 mg | ORAL_CAPSULE | Freq: Every day | ORAL | Status: DC
  Administered 2024-02-07 – 2024-02-12 (×7): 200 mg via ORAL
  Filled 2024-02-06 (×5): qty 2

## 2024-02-06 MED ORDER — CALCIUM CHLORIDE 10 % IV SOLN
INTRAVENOUS | Status: DC | PRN
Start: 2024-02-06 — End: 2024-02-06
  Administered 2024-02-06: 150 mg via INTRAVENOUS
  Administered 2024-02-06 (×2): 100 mg via INTRAVENOUS
  Administered 2024-02-06: 150 mg via INTRAVENOUS

## 2024-02-06 MED ORDER — POTASSIUM CHLORIDE 10 MEQ/50ML IV SOLN: 10.0000 meq | INTRAVENOUS | Status: AC

## 2024-02-06 MED ORDER — ACETAMINOPHEN 160 MG/5ML PO SOLN: 1000.0000 mg | Freq: Four times a day (QID) | ORAL | Status: AC

## 2024-02-06 MED ORDER — ALBUMIN HUMAN 5 % IV SOLN
250.0000 mL | INTRAVENOUS | Status: DC | PRN
  Administered 2024-02-06 (×6): 12.5 g via INTRAVENOUS
  Filled 2024-02-06: qty 250

## 2024-02-06 MED ORDER — PROTAMINE SULFATE 10 MG/ML IV SOLN
INTRAVENOUS | Status: AC
  Filled 2024-02-06: qty 50

## 2024-02-06 MED ORDER — 0.9 % SODIUM CHLORIDE (POUR BTL) OPTIME
TOPICAL | Status: DC | PRN
  Administered 2024-02-06 (×2): 7000 mL

## 2024-02-06 MED ORDER — METOPROLOL TARTRATE 25 MG/10 ML ORAL SUSPENSION: 12.5000 mg | Freq: Two times a day (BID) | ORAL | Status: DC

## 2024-02-06 MED ORDER — ASPIRIN 81 MG PO CHEW
324.0000 mg | CHEWABLE_TABLET | Freq: Every day | ORAL | Status: DC
  Filled 2024-02-06 (×2): qty 4

## 2024-02-06 MED ORDER — FENTANYL CITRATE (PF) 250 MCG/5ML IJ SOLN
INTRAMUSCULAR | Status: DC | PRN
  Administered 2024-02-06 (×7): 100 ug via INTRAVENOUS
  Administered 2024-02-06: 50 ug via INTRAVENOUS
  Administered 2024-02-06 (×3): 100 ug via INTRAVENOUS
  Administered 2024-02-06: 50 ug via INTRAVENOUS
  Administered 2024-02-06 (×6): 100 ug via INTRAVENOUS
  Administered 2024-02-06: 50 ug via INTRAVENOUS
  Administered 2024-02-06 (×2): 100 ug via INTRAVENOUS

## 2024-02-06 MED ORDER — LEVOFLOXACIN IN D5W 750 MG/150ML IV SOLN
750.0000 mg | INTRAVENOUS | Status: AC
  Administered 2024-02-07 (×2): 750 mg via INTRAVENOUS
  Filled 2024-02-06: qty 150

## 2024-02-06 MED ORDER — CHLORHEXIDINE GLUCONATE 0.12 % MT SOLN
15.0000 mL | OROMUCOSAL | Status: AC
  Administered 2024-02-06 (×2): 15 mL via OROMUCOSAL
  Filled 2024-02-06: qty 15

## 2024-02-06 MED ORDER — SODIUM CHLORIDE 0.45 % IV SOLN: INTRAVENOUS | Status: AC | PRN

## 2024-02-06 MED ORDER — SODIUM CHLORIDE 0.9 % IV SOLN: INTRAVENOUS | Status: AC

## 2024-02-06 MED ORDER — SODIUM CHLORIDE (PF) 0.9 % IJ SOLN
OROMUCOSAL | Status: DC | PRN
  Administered 2024-02-06 (×4): 4 mL via TOPICAL

## 2024-02-06 MED ORDER — PANTOPRAZOLE SODIUM 40 MG IV SOLR
40.0000 mg | Freq: Every day | INTRAVENOUS | Status: AC
  Administered 2024-02-06 – 2024-02-07 (×4): 40 mg via INTRAVENOUS
  Filled 2024-02-06 (×2): qty 10

## 2024-02-06 MED ORDER — HEMOSTATIC AGENTS (NO CHARGE) OPTIME
TOPICAL | Status: DC | PRN
  Administered 2024-02-06 (×2): 1 via TOPICAL

## 2024-02-06 MED ORDER — ROCURONIUM BROMIDE 10 MG/ML (PF) SYRINGE
PREFILLED_SYRINGE | INTRAVENOUS | Status: AC
  Filled 2024-02-06: qty 20

## 2024-02-06 MED ORDER — METOPROLOL TARTRATE 5 MG/5ML IV SOLN: 2.5000 mg | INTRAVENOUS | Status: DC | PRN

## 2024-02-06 MED ORDER — EPINEPHRINE HCL 5 MG/250ML IV SOLN IN NS: 0.0000 ug/min | INTRAVENOUS | Status: DC

## 2024-02-06 MED ORDER — SODIUM CHLORIDE 0.9% FLUSH: 3.0000 mL | INTRAVENOUS | Status: DC | PRN

## 2024-02-06 MED ORDER — SODIUM CHLORIDE 0.9 % IV SOLN: 250.0000 mL | INTRAVENOUS | Status: AC

## 2024-02-06 MED ORDER — MIDAZOLAM HCL 2 MG/2ML IJ SOLN: 2.0000 mg | INTRAMUSCULAR | Status: DC | PRN

## 2024-02-06 MED ORDER — PROTAMINE SULFATE 10 MG/ML IV SOLN
INTRAVENOUS | Status: DC | PRN
  Administered 2024-02-06 (×2): 350 mg via INTRAVENOUS

## 2024-02-06 MED ORDER — ASPIRIN 325 MG PO TBEC
325.0000 mg | DELAYED_RELEASE_TABLET | Freq: Every day | ORAL | Status: DC
  Administered 2024-02-07 – 2024-02-11 (×7): 325 mg via ORAL
  Filled 2024-02-06 (×5): qty 1

## 2024-02-06 MED ORDER — PANTOPRAZOLE SODIUM 40 MG PO TBEC
40.0000 mg | DELAYED_RELEASE_TABLET | Freq: Every day | ORAL | Status: DC
  Administered 2024-02-08 – 2024-02-12 (×6): 40 mg via ORAL
  Filled 2024-02-06 (×5): qty 1

## 2024-02-06 MED ORDER — LACTATED RINGERS IV SOLN: INTRAVENOUS | Status: AC

## 2024-02-06 MED ORDER — PROPOFOL 10 MG/ML IV BOLUS
INTRAVENOUS | Status: DC | PRN
  Administered 2024-02-06: 100 mg via INTRAVENOUS
  Administered 2024-02-06 (×2): 30 mg via INTRAVENOUS
  Administered 2024-02-06: 100 mg via INTRAVENOUS
  Administered 2024-02-06 (×2): 30 mg via INTRAVENOUS

## 2024-02-06 MED ORDER — NOREPINEPHRINE 4 MG/250ML-% IV SOLN
0.0000 ug/min | INTRAVENOUS | Status: DC
  Administered 2024-02-06 (×2): 2 ug/min via INTRAVENOUS
  Administered 2024-02-07 (×2): 4 ug/min via INTRAVENOUS
  Administered 2024-02-08: 2 ug/min via INTRAVENOUS
  Administered 2024-02-08: 4 ug/min via INTRAVENOUS
  Administered 2024-02-08: 2 ug/min via INTRAVENOUS
  Administered 2024-02-08: 4 ug/min via INTRAVENOUS
  Administered 2024-02-09: 2 ug/min via INTRAVENOUS
  Filled 2024-02-06 (×4): qty 250

## 2024-02-06 MED ORDER — ACETAMINOPHEN 160 MG/5ML PO SOLN
650.0000 mg | Freq: Once | ORAL | Status: AC
  Administered 2024-02-06 (×2): 650 mg
  Filled 2024-02-06: qty 20.3

## 2024-02-06 MED ORDER — MIDAZOLAM HCL (PF) 10 MG/2ML IJ SOLN
INTRAMUSCULAR | Status: AC
  Filled 2024-02-06: qty 2

## 2024-02-06 MED ORDER — ALBUMIN HUMAN 5 % IV SOLN: INTRAVENOUS | Status: DC | PRN

## 2024-02-06 MED ORDER — MORPHINE SULFATE (PF) 2 MG/ML IV SOLN
1.0000 mg | INTRAVENOUS | Status: DC | PRN
  Administered 2024-02-06: 4 mg via INTRAVENOUS
  Administered 2024-02-06 (×2): 2 mg via INTRAVENOUS
  Administered 2024-02-06 – 2024-02-07 (×3): 4 mg via INTRAVENOUS
  Administered 2024-02-07 (×2): 2 mg via INTRAVENOUS
  Filled 2024-02-06: qty 2
  Filled 2024-02-06 (×2): qty 1
  Filled 2024-02-06: qty 2

## 2024-02-06 MED ORDER — ASPIRIN 81 MG PO CHEW
324.0000 mg | CHEWABLE_TABLET | Freq: Once | ORAL | Status: DC
  Filled 2024-02-06: qty 4

## 2024-02-06 MED ORDER — PHENYLEPHRINE 80 MCG/ML (10ML) SYRINGE FOR IV PUSH (FOR BLOOD PRESSURE SUPPORT)
PREFILLED_SYRINGE | INTRAVENOUS | Status: DC | PRN
  Administered 2024-02-06 (×2): 80 ug via INTRAVENOUS

## 2024-02-06 MED ORDER — ONDANSETRON HCL 4 MG/2ML IJ SOLN
4.0000 mg | Freq: Four times a day (QID) | INTRAMUSCULAR | Status: DC | PRN
  Administered 2024-02-08 (×2): 4 mg via INTRAVENOUS
  Filled 2024-02-06: qty 2

## 2024-02-06 MED ORDER — CALCIUM CHLORIDE 10 % IV SOLN
INTRAVENOUS | Status: AC
  Filled 2024-02-06: qty 10

## 2024-02-06 MED ORDER — ACETAMINOPHEN 500 MG PO TABS
1000.0000 mg | ORAL_TABLET | Freq: Four times a day (QID) | ORAL | Status: AC
  Administered 2024-02-06 – 2024-02-11 (×27): 1000 mg via ORAL
  Filled 2024-02-06 (×18): qty 2

## 2024-02-06 MED ORDER — VANCOMYCIN HCL IN DEXTROSE 1-5 GM/200ML-% IV SOLN
1000.0000 mg | Freq: Once | INTRAVENOUS | Status: AC
  Administered 2024-02-06 (×2): 1000 mg via INTRAVENOUS
  Filled 2024-02-06: qty 200

## 2024-02-06 MED ORDER — METOCLOPRAMIDE HCL 5 MG/ML IJ SOLN
10.0000 mg | Freq: Four times a day (QID) | INTRAMUSCULAR | Status: AC
  Administered 2024-02-06 – 2024-02-07 (×10): 10 mg via INTRAVENOUS
  Filled 2024-02-06 (×5): qty 2

## 2024-02-06 MED ORDER — INSULIN REGULAR(HUMAN) IN NACL 100-0.9 UT/100ML-% IV SOLN
INTRAVENOUS | Status: DC
  Administered 2024-02-06 (×2): 7.5 [IU]/h via INTRAVENOUS
  Administered 2024-02-07 (×2): 4.4 [IU]/h via INTRAVENOUS
  Filled 2024-02-06: qty 100

## 2024-02-06 MED ORDER — VASOPRESSIN 20 UNIT/ML IV SOLN
INTRAVENOUS | Status: AC
  Filled 2024-02-06: qty 1

## 2024-02-06 MED ORDER — MIDAZOLAM HCL (PF) 5 MG/ML IJ SOLN
INTRAMUSCULAR | Status: DC | PRN
  Administered 2024-02-06 (×3): 1 mg via INTRAVENOUS
  Administered 2024-02-06: 2 mg via INTRAVENOUS
  Administered 2024-02-06: 1 mg via INTRAVENOUS
  Administered 2024-02-06 (×3): 2 mg via INTRAVENOUS

## 2024-02-06 MED ORDER — HEPARIN SODIUM (PORCINE) 1000 UNIT/ML IJ SOLN
INTRAMUSCULAR | Status: AC
  Filled 2024-02-06: qty 1

## 2024-02-06 MED ORDER — NITROGLYCERIN IN D5W 200-5 MCG/ML-% IV SOLN: 0.0000 ug/min | INTRAVENOUS | Status: DC

## 2024-02-06 MED FILL — Heparin Sodium (Porcine) Inj 1000 Unit/ML: INTRAMUSCULAR | Qty: 2500 | Status: AC

## 2024-02-06 MED FILL — Lidocaine HCl Local Preservative Free (PF) Inj 2%: INTRAMUSCULAR | Qty: 14 | Status: AC

## 2024-02-06 SURGICAL SUPPLY — 103 items
ADAPTER DLP PERFUSION .25INX2I (MISCELLANEOUS) IMPLANT
APPLICATOR PREVELEAK SEALANT (TIP) IMPLANT
BAG DECANTER FOR FLEXI CONT (MISCELLANEOUS) ×4 IMPLANT
BLADE CLIPPER SURG (BLADE) ×2 IMPLANT
BLADE STERNUM SYSTEM 6 (BLADE) ×2 IMPLANT
BLADE SURG 10 STRL SS (BLADE) IMPLANT
BLADE SURG 11 STRL SS (BLADE) ×2 IMPLANT
BLADE SURG 15 STRL LF DISP TIS (BLADE) IMPLANT
BNDG ELASTIC 4INX 5YD STR LF (GAUZE/BANDAGES/DRESSINGS) IMPLANT
BNDG ELASTIC 4X5.8 VLCR STR LF (GAUZE/BANDAGES/DRESSINGS) ×2 IMPLANT
BNDG ELASTIC 6INX 5YD STR LF (GAUZE/BANDAGES/DRESSINGS) ×2 IMPLANT
BNDG GAUZE DERMACEA FLUFF 4 (GAUZE/BANDAGES/DRESSINGS) ×2 IMPLANT
CANISTER SUCTION 3000ML PPV (SUCTIONS) ×4 IMPLANT
CANNULA AORTIC ROOT 9FR (CANNULA) ×2 IMPLANT
CANNULA MC2 2 STG 29/37 NON-V (CANNULA) IMPLANT
CANNULA NON VENT 20FR 12 (CANNULA) ×2 IMPLANT
CANNULA SUMP PERICARDIAL (CANNULA) IMPLANT
CANNULA VESSEL 3MM BLUNT TIP (CANNULA) IMPLANT
CATH HEART VENT LEFT (CATHETERS) ×2 IMPLANT
CATH ROBINSON RED A/P 18FR (CATHETERS) ×6 IMPLANT
CAUTERY SURG HI TEMP FINE TIP (MISCELLANEOUS) IMPLANT
CLIP TI LARGE 6 (CLIP) IMPLANT
CLIP TI MEDIUM 24 (CLIP) IMPLANT
CLIP TI WIDE RED SMALL 24 (CLIP) IMPLANT
CNTNR URN SCR LID CUP LEK RST (MISCELLANEOUS) ×2 IMPLANT
CONN ST 1/4X3/8 BEN (MISCELLANEOUS) IMPLANT
CONNECTOR BLAKE 2:1 CARIO BLK (MISCELLANEOUS) ×2 IMPLANT
CONTAINER PROTECT SURGISLUSH (MISCELLANEOUS) ×4 IMPLANT
COUNTER NDL 20CT MAGNET RED (NEEDLE) IMPLANT
DERMABOND ADVANCED .7 DNX12 (GAUZE/BANDAGES/DRESSINGS) IMPLANT
DEVICE SUT CK QUICK LOAD MINI (Prosthesis & Implant Heart) IMPLANT
DRAIN CHANNEL 19F RND (DRAIN) ×4 IMPLANT
DRAIN CHANNEL 28F RND 3/8 FF (WOUND CARE) IMPLANT
DRAIN CONNECTOR BLAKE 1:1 (MISCELLANEOUS) IMPLANT
DRAPE SRG 135X102X78XABS (DRAPES) ×4 IMPLANT
DRAPE WARM FLUID 44X44 (DRAPES) ×4 IMPLANT
DRESSING AQUACEL AG SP 3.5X10 (GAUZE/BANDAGES/DRESSINGS) ×2 IMPLANT
DRSG AQUACEL AG ADV 3.5X10 (GAUZE/BANDAGES/DRESSINGS) IMPLANT
ELECT CAUTERY BLADE 6.4 (BLADE) ×2 IMPLANT
ELECTRODE BLDE 4.0 EZ CLN MEGD (MISCELLANEOUS) ×2 IMPLANT
ELECTRODE REM PT RTRN 9FT ADLT (ELECTROSURGICAL) ×6 IMPLANT
FELT TEFLON 1X6 (MISCELLANEOUS) ×8 IMPLANT
GAUZE SPONGE 4X4 12PLY STRL (GAUZE/BANDAGES/DRESSINGS) ×2 IMPLANT
GLOVE BIO SURGEON STRL SZ7.5 (GLOVE) ×6 IMPLANT
GOWN STRL REUS W/ TWL LRG LVL3 (GOWN DISPOSABLE) ×8 IMPLANT
GOWN STRL REUS W/ TWL XL LVL3 (GOWN DISPOSABLE) ×4 IMPLANT
GOWN STRL SURGICAL XL XLNG (GOWN DISPOSABLE) IMPLANT
GRAFT CV 30X8WVN NDL (Graft) IMPLANT
GRAFT WOVEN D/V 30DX30L (Vascular Products) IMPLANT
HEMOSTAT POWDER SURGIFOAM 1G (HEMOSTASIS) ×6 IMPLANT
INSERT SUTURE HOLDER (MISCELLANEOUS) ×2 IMPLANT
KIT BASIN OR (CUSTOM PROCEDURE TRAY) ×4 IMPLANT
KIT SUT CK MINI COMBO 4X17 (Prosthesis & Implant Heart) IMPLANT
KIT TURNOVER KIT B (KITS) ×4 IMPLANT
KIT VASOVIEW HEMOPRO 2 VH 4000 (KITS) ×2 IMPLANT
LEAD PACING MYOCARDI (MISCELLANEOUS) ×2 IMPLANT
LINE VENT (MISCELLANEOUS) IMPLANT
LOOP VASCLR EXTRA MAXI WHITE (MISCELLANEOUS) IMPLANT
LOOP VESSEL MINI RED (MISCELLANEOUS) IMPLANT
LOOPS VASCLR EXTRA MAXI WHITE (MISCELLANEOUS) ×2 IMPLANT
MARKER DISTAL GRAFT W/ HOLDER (MISCELLANEOUS) ×6 IMPLANT
NS IRRIG 1000ML POUR BTL (IV SOLUTION) ×12 IMPLANT
PACK E OPEN HEART (SUTURE) ×2 IMPLANT
PACK OPEN HEART (CUSTOM PROCEDURE TRAY) ×2 IMPLANT
PAD ARMBOARD POSITIONER FOAM (MISCELLANEOUS) ×8 IMPLANT
PAD ELECT DEFIB RADIOL ZOLL (MISCELLANEOUS) ×2 IMPLANT
PENCIL BUTTON HOLSTER BLD 10FT (ELECTRODE) ×2 IMPLANT
POSITIONER HEAD DONUT 9IN (MISCELLANEOUS) ×4 IMPLANT
PUNCH AORTIC ROTATE 4.0MM (MISCELLANEOUS) ×2 IMPLANT
SEALANT HEMOST PREVELEAK 4ML (HEMOSTASIS) IMPLANT
SET MPS 3-ND DEL (MISCELLANEOUS) IMPLANT
SET VEIN GRAFT PERF (SET/KITS/TRAYS/PACK) IMPLANT
SPONGE T-LAP 18X18 ~~LOC~~+RFID (SPONGE) IMPLANT
STOPCOCK 4 WAY LG BORE MALE ST (IV SETS) IMPLANT
SUPPORT HEART JANKE-BARRON (MISCELLANEOUS) ×2 IMPLANT
SUT EB EXC GRN/WHT 2-0 V-5 (SUTURE) IMPLANT
SUT ETHIBOND X763 2 0 SH 1 (SUTURE) ×4 IMPLANT
SUT MNCRL AB 3-0 PS2 18 (SUTURE) ×4 IMPLANT
SUT MNCRL AB 4-0 PS2 18 (SUTURE) IMPLANT
SUT PDS AB 1 CTX 36 (SUTURE) ×4 IMPLANT
SUT PROLENE 4 0 SH DA (SUTURE) ×2 IMPLANT
SUT PROLENE 4-0 RB1 .5 CRCL 36 (SUTURE) ×10 IMPLANT
SUT PROLENE 5 0 C 1 36 (SUTURE) ×6 IMPLANT
SUT PROLENE 7 0 BV1 MDA (SUTURE) ×2 IMPLANT
SUT SILK 1 MH (SUTURE) IMPLANT
SUT STEEL STERNAL CCS#1 18IN (SUTURE) IMPLANT
SUT VIC AB 1 CTX36XBRD ANBCTR (SUTURE) IMPLANT
SUT VIC AB 2-0 CT1 TAPERPNT 27 (SUTURE) IMPLANT
SYSTEM SAHARA CHEST DRAIN ATS (WOUND CARE) ×2 IMPLANT
TAPE CLOTH SURG 4X10 WHT LF (GAUZE/BANDAGES/DRESSINGS) IMPLANT
TAPE PAPER 2X10 WHT MICROPORE (GAUZE/BANDAGES/DRESSINGS) IMPLANT
TOWEL GREEN STERILE (TOWEL DISPOSABLE) ×2 IMPLANT
TOWEL GREEN STERILE FF (TOWEL DISPOSABLE) ×2 IMPLANT
TRAY FOLEY SLVR 16FR TEMP STAT (SET/KITS/TRAYS/PACK) ×2 IMPLANT
TUBE CONNECTING 12X1/4 (SUCTIONS) ×2 IMPLANT
TUBE CONNECTING 20X1/4 (TUBING) IMPLANT
TUBE SUCT INTRACARD DLP 20F (MISCELLANEOUS) ×2 IMPLANT
TUBE SUCTION CARDIAC 10FR (CANNULA) ×2 IMPLANT
TUBING LAP HI FLOW INSUFFLATIO (TUBING) ×2 IMPLANT
UNDERPAD 30X36 HEAVY ABSORB (UNDERPADS AND DIAPERS) ×2 IMPLANT
VALVE AORTIC KONECT RESILIA 29 (Valve) IMPLANT
WATER STERILE IRR 1000ML POUR (IV SOLUTION) ×4 IMPLANT
YANKAUER SUCT BULB TIP NO VENT (SUCTIONS) ×2 IMPLANT

## 2024-02-06 NOTE — Transfer of Care (Signed)
 Immediate Anesthesia Transfer of Care Note  Patient: Colin Ramirez  Procedure(s) Performed: CORONARY ARTERY BYPASS GRAFTING (CABG) x THREE USING LEFT INTERNAL MAMMARY ARTERY AND ENDOSCOPICALLY HARVESTED RIGHT GREATER SAPHENOUS VEIN (Chest) BENTALL PROCEDURE AND HEMIARCH USING HEMASHIELD PLATINUM VASCULAR GRAFT AND KONECT RESILIA AORTIC VALVE CONDUIT, RIGHT AXILLARY CANNULATION USING HEMASHIELD PLATINUM VASCULAR GRAFT (Chest) ECHOCARDIOGRAM, TRANSESOPHAGEAL, INTRAOPERATIVE  Patient Location: ICU  Anesthesia Type:General  Level of Consciousness: sedated and Patient remains intubated per anesthesia plan  Airway & Oxygen Therapy: Patient remains intubated per anesthesia plan and Patient placed on Ventilator (see vital sign flow sheet for setting)  Post-op Assessment: Report given to RN and Post -op Vital signs reviewed and stable  Post vital signs: Reviewed and stable  Last Vitals:  Vitals Value Taken Time  BP 99/78 02/06/24 18:21  Temp 37.1 C 02/06/24 18:21  Pulse 80 02/06/24 18:22  Resp 16 02/06/24 18:22  SpO2 90 % 02/06/24 18:22  Vitals shown include unfiled device data.  Last Pain:  Vitals:   02/06/24 0803  TempSrc:   PainSc: 2       Patients Stated Pain Goal: 0 (02/06/24 0803)  Complications: No notable events documented.

## 2024-02-06 NOTE — Progress Notes (Signed)
 PT Cancellation Note  Patient Details Name: THOMPSON MCKIM MRN: [id number] DOB: October 07, 1965   Cancelled Treatment:    Reason Eval/Treat Not Completed: Patient at procedure or test/unavailable, at OR for CABG, will continue to follow and evaluate post-op.   Izetta Call, PT, DPT   Acute Rehabilitation Department Office [phone] Secure Chat Communication Preferred   Izetta JULIANNA Call 02/06/2024, 9:49 AM

## 2024-02-06 NOTE — Anesthesia Preprocedure Evaluation (Addendum)
 Anesthesia Evaluation  Patient identified by MRN, date of birth, ID band Patient awake    Reviewed: Allergy & Precautions, NPO status , Patient's Chart, lab work & pertinent test results  Airway Mallampati: III  TM Distance: <3 FB Neck ROM: Limited    Dental  (+) Teeth Intact, Dental Advisory Given   Pulmonary sleep apnea , Current Smoker and Patient abstained from smoking.   breath sounds clear to auscultation       Cardiovascular hypertension, Pt. on medications and Pt. on home beta blockers + angina  + CAD   Rhythm:Regular Rate:Normal     Neuro/Psych  Neuromuscular disease  negative psych ROS   GI/Hepatic Neg liver ROS, hiatal hernia,GERD  Medicated,,  Endo/Other  diabetes, Type 2, Oral Hypoglycemic AgentsHypothyroidism    Renal/GU negative Renal ROS     Musculoskeletal  (+) Arthritis ,    Abdominal   Peds  Hematology negative hematology ROS (+)   Anesthesia Other Findings   Reproductive/Obstetrics                              Anesthesia Physical Anesthesia Plan  ASA: 4  Anesthesia Plan: General   Post-op Pain Management: Minimal or no pain anticipated   Induction: Intravenous  PONV Risk Score and Plan: Ondansetron   Airway Management Planned: Oral ETT and Video Laryngoscope Planned  Additional Equipment: Arterial line, CVP, TEE and Ultrasound Guidance Line Placement  Intra-op Plan:   Post-operative Plan: Post-operative intubation/ventilation  Informed Consent: I have reviewed the patients History and Physical, chart, labs and discussed the procedure including the risks, benefits and alternatives for the proposed anesthesia with the patient or authorized representative who has indicated his/her understanding and acceptance.     Dental advisory given  Plan Discussed with: CRNA  Anesthesia Plan Comments:          Anesthesia Quick Evaluation
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
## 2024-02-06 NOTE — Anesthesia Procedure Notes (Signed)
 Central Venous Catheter Insertion Performed by: Tilford Franky BIRCH, MD, anesthesiologist Start/End8/04/2024 8:35 AM, 02/06/2024 8:45 AM Patient location: Pre-op . Preanesthetic checklist: patient identified, IV checked, site marked, risks and benefits discussed, surgical consent, monitors and equipment checked, pre-op  evaluation, timeout performed and anesthesia consent Position: Trendelenburg Lidocaine  1% used for infiltration and patient sedated Hand hygiene performed , maximum sterile barriers used  and Seldinger technique used Catheter size: 9 Fr Total catheter length 10. Central line was placed.MAC introducer Swan type:thermodilution PA Cath depth:50 Procedure performed using ultrasound guided technique. Ultrasound Notes:anatomy identified, needle tip was noted to be adjacent to the nerve/plexus identified, no ultrasound evidence of intravascular and/or intraneural injection and image(s) printed for medical record Attempts: 1 Following insertion, line sutured, dressing applied and Biopatch. Post procedure assessment: blood return through all ports, free fluid flow and no air  Patient tolerated the procedure well with no immediate complications.

## 2024-02-06 NOTE — Progress Notes (Signed)
 Rapid wean initiated per protocol

## 2024-02-06 NOTE — Anesthesia Procedure Notes (Signed)
 Arterial Line Insertion Start/End8/04/2024 8:40 AM, 02/06/2024 8:55 AM Performed by: Carolee Lauraine DASEN, CRNA, CRNA  Patient location: Pre-op . Preanesthetic checklist: patient identified, IV checked, site marked, risks and benefits discussed, surgical consent, monitors and equipment checked, pre-op  evaluation, timeout performed and anesthesia consent Lidocaine  1% used for infiltration Left, radial was placed Catheter size: 20 G Hand hygiene performed  and maximum sterile barriers used   Attempts: 1 Procedure performed without using ultrasound guided technique. Following insertion, dressing applied and Biopatch. Post procedure assessment: normal and unchanged  Patient tolerated the procedure well with no immediate complications.

## 2024-02-06 NOTE — Op Note (Signed)
 [address]       [place] [address]             [phone]                                           02/06/2024 Patient:  Colin Ramirez Pre-Op  Dx: Unstable angina   3 vessel CAD   Bicuspid aortic valve   Aortic root aneurysm   Ascending aortic aneurysm Post-op Dx:  same Procedure: CABG X 3.  LIMA LAD, RSVG PDA, diagonal Bentall Procedure with 29mm Konect Resilia Right and left coronary re-implantation.   Endoscopic greater saphenous vein harvest on the right Right axillary artery cannulation with 8 mm graft Antegrade cerebral perfusion. Ascending aortic aneurysm/Hemiarch replacement with 30mm graft   Surgeon and Role:      * Namita Yearwood, Linnie KIDD, MD - Primary    * CHARM Donald, PA-C - assisting An experienced assistant was required given the complexity of this surgery and the standard of surgical care. The assistant was needed for exposure, dissection, suctioning, retraction of delicate tissues and sutures, instrument exchange and for overall help during this procedure.    Anesthesia  general EBL:  1000 ml Blood Administration: 2 FFP, 1 plts Circulatory arrest time: 50 Xclamp Time:  237 min Pump Time:   Drains: 28 F blake drain: mediastinal X 2, 47F blake L chest  Wires: ventricular Counts: correct   Indications: 58yo male admitted with unstable angina.  Left heart catherization revealed 3 vessel CAD.  His echo showed a bicuspid valve with root dilation to 4.9cm.  He was also noted to have an ascending aortic aneurysm at 4.8cm on CT scan.  Given his young age, and fact that he require bypass surgery, I elected to proceed with a Bentall, and aneurysm replacement as well. Findings: Good LIMA and vein.  Small PDA, diagonal, and LAD.  Good flows.  Bicuspid valve with significant root dilation.    Operative Technique: All invasive lines were placed in pre-op  holding.  After the risks, benefits and alternatives were thoroughly discussed,  the patient was brought to the operative theatre.  Anesthesia was induced, and the patient was prepped and draped in normal sterile fashion.  An appropriate surgical pause was performed, and pre-operative antibiotics were dosed accordingly.  We began with simultaneous incisions along the right leg for harvesting of the greater saphenous vein and an incision over the right deltopectoral groove.  This was carried down with a combination of Bovie cautery and blunt dissection until we reached the right axillary artery.  It was encircled with a vessel loop.  Next we move to the chest for the sternotomy.  This was carried down with bovie cautery, and the sternum was divided with a reciprocating saw.  Meticulous hemostasis was obtained.  The left internal thoracic artery was exposed and harvested in in pedicled fashion.  The patient was systemically heparinized, and the artery was divided distally, and placed in a papaverine  sponge.    We then returned to our axillary cannulation site.  The axillary artery was clamped.  An arteriotomy was created and an 8 mm graft was sewn to the axillary artery in an end-to-side fashion.  This would serve as our arterial cannulation site.  The sternal retractor was placed.  The pericardium was divided in the midline and fashioned into a cradle  with pericardial stitches.  The right atrial appendage was used for venous cannulation site.  Cardiopulmonary bypass was initiated, and the heart retractor was placed. The cross clamp was applied, and a dose of anterograde cardioplegia was given with good arrest of the heart.  We moved to the posterior wall of the heart, and found a good target on the PDA.  An arteriotomy was made, and the vein graft was anastomosed to it in an end to side fashion.   Next, we exposed the anterior wall of the heart and identified a good target on diagonal.   An arteriotomy was created.  The vein was anastomosed in an end to side fashion.  Finally, we exposed  a good target on the  LAD, and fashioned an end to side anastomosis between it and the LITA.  The cardioplegia was redosed.    We then began to mobilize the ascending aorta up to the innominate artery.  It was encircled with a umbilical tape.  Once we reached 28 degrees, the innominate artery was clamped and we began our circulatory arrest with antegrade cerebral perfusion.  The ascending aorta was then divided up to the innominate artery.  The aneurysm was sized to 30 mm graft.  This was reinforced with a outer felt strip, and an inner later of the hemashield graft.  The graft was anastomosed to the distal aorta and upon completion the graft was thoroughly de-aired and we removed our innominate artery clamp and resumed full cardiopulmonary bypass.  At this point we began to rewarm the patient.  We then focused our attention to the proximal aorta.  The right and left coronary ostia were mobilized.  The aortic valve was bicuspid, and both leaflets were removed.  The annulus was debribed of all calcium .  It was then sized to a 29mm valve conduit.  Inverting pledgeted mattress sutures were placed a circumferentially through the annulus.  These sutures were then passed through the sewing ring of the valve.  Once the valve was seated in the annulus, it was secured with Core-knot sutures.  We then connected the right and left coronary buttons to the graft.  The proximal and distal grafts were then sewn together.  The vein grafts were then anastamosed to the the hemashield graft.  An antergrade dose of cardioplegia was given.  The heart was thoroughly de-aired, and the cross clamp was then removed.     Hemostasis was obtained, and we separated from cardiopulmonary bypass without event.  The heparin  was reversed with protamine .  Chest tubes and wires were placed, and the sternum was re-approximated with sternal wires.  The soft tissue and skin were re-approximated wth absorbable suture.    The patient tolerated the  procedure without any immediate complications, and was transferred to the ICU in guarded condition.   Oneika Simonian MALVA Rayas

## 2024-02-06 NOTE — Brief Op Note (Signed)
 01/24/2024 - 02/06/2024  3:55 PM  PATIENT:  Colin Ramirez  58 y.o. male  PRE-OPERATIVE DIAGNOSIS:  1. Coronary Artery Disease  2. Mild to moderate aortic stenosis 3. Bicuspid Aortic Valve 4. Ascending Thoracic Aortic Aneurysm  POST-OPERATIVE DIAGNOSIS:  1. Coronary Artery Disease  2. Mild to moderate aortic stenosis 3. Bicuspid Aortic Valve 4. Ascending Thoracic Aortic Aneurysm  PROCEDURE:  INTRAOPERATIVE TRANSESOPHAGEAL ECHOCARDIOGRAM, CORONARY ARTERY BYPASS GRAFTING (CABG) x THREE (LIMA to LAD, SVG to DIAGONAL, SVG to PDA) USING LEFT INTERNAL MAMMARY ARTERY AND ENDOSCOPICALLY HARVESTED RIGHT GREATER SAPHENOUS VEIN, and BENTALL PROCEDURE (USING HEMASHIELD PLATINUM VASCULAR GRAFT AND  KONECT RESILIA AORTIC VALVE CONDUIT, MODEL # [id number], SERIAL # [id number],SIZE ) Vein harvest time: 24 min Vein prep time: 12 min  SURGEON:  Surgeons and Role:    Shyrl Linnie KIDD, MD - Primary  PHYSICIAN ASSISTANT: Kyla Donald PA-C  ASSISTANTS: Evalene Domino RNFA   ANESTHESIA:   general  EBL:  Per anesthesia, perfusion record  DRAINS: Chest tubes placed in the mediastinal and pleural spaces   SPECIMEN:  Source of Specimen:  Native aortic valve leaflets  DISPOSITION OF SPECIMEN:  PATHOLOGY  COUNTS CORRECT:  YES  DICTATION: .Dragon Dictation  PLAN OF CARE: Admit to inpatient   PATIENT DISPOSITION:  PACU - hemodynamically stable.   Delay start of Pharmacological VTE agent (>24hrs) due to surgical blood loss or risk of bleeding: yes  BASELINE WEIGHT: 111.7 kg

## 2024-02-06 NOTE — Progress Notes (Signed)
 Patient in OR with CT surgery.  Will sign off, please let us  know when we can be of further assistance. TRH.

## 2024-02-06 NOTE — Progress Notes (Signed)
   02/06/24 2332  Vent Select  Invasive or Noninvasive Noninvasive  Adult Vent Y  Adult Ventilator Settings  Vent Type Servo i  Vent Mode BIPAP;PSV  FiO2 (%) 40 %  IPAP 10 cmH20  EPAP 5 cmH20  Pressure Support 5 cmH20  PEEP 5 cmH20  Adult Ventilator Measurements  Peak Airway Pressure 10 L/min  Mean Airway Pressure 6 cmH20  Resp Rate Spontaneous 18 br/min  Resp Rate Total 18 br/min  Exhaled Vt 740 mL  Measured Ve 12.9 L  Auto PEEP 0 cmH20  Total PEEP 5 cmH20  Adult Ventilator Alarms  Alarms On Y  Ve High Alarm 20 L/min  Ve Low Alarm 4 L/min  Resp Rate High Alarm 36 br/min  Resp Rate Low Alarm 10  PEEP Low Alarm 3 cmH2O  Press High Alarm 40 cmH2O    Pt wear CPAP at home. Placed on Bipap for the night

## 2024-02-06 NOTE — Procedures (Signed)
 Extubation Procedure Note  Patient Details:   Name: Colin Ramirez DOB: 06/13/1966 MRN: [id number]   Airway Documentation:    Vent end date: 02/06/24 Vent end time: 2155   Evaluation  O2 sats: stable throughout Complications: No apparent complications Patient did tolerate procedure well. Bilateral Breath Sounds: Clear, Diminished   Yes  Pt extubated to 4LNC per protocol. NIF:-38 VC:917ml IS; . Positive cuff leak, pt able to say his name. No stridor noted. Pt tolerated well.   Mariella Blackwelder E Limon Nunez 02/06/2024, 10:00 PM

## 2024-02-06 NOTE — Anesthesia Procedure Notes (Signed)
 Procedure Name: Intubation Date/Time: 02/06/2024 9:31 AM  Performed by: Carolee Lauraine DASEN, CRNAPre-anesthesia Checklist: Patient identified, Emergency Drugs available, Suction available and Patient being monitored Patient Re-evaluated:Patient Re-evaluated prior to induction Oxygen Delivery Method: Circle System Utilized Preoxygenation: Pre-oxygenation with 100% oxygen Induction Type: IV induction Ventilation: Mask ventilation without difficulty and Oral airway inserted - appropriate to patient size Laryngoscope Size: Glidescope and 4 Grade View: Grade I Tube type: Oral Tube size: 8.0 mm Number of attempts: 1 Airway Equipment and Method: Stylet and Oral airway Placement Confirmation: ETT inserted through vocal cords under direct vision, positive ETCO2 and breath sounds checked- equal and bilateral Secured at: 23 cm Tube secured with: Tape Dental Injury: Teeth and Oropharynx as per pre-operative assessment

## 2024-02-06 NOTE — Discharge Instructions (Signed)

## 2024-02-06 NOTE — Progress Notes (Signed)
   [address] [place] [address]             [phone]    S/p CABG Bentall  Intubated, sedated  BP (!) (P) 174/94   Pulse (!) 53   Temp (P) 98.3 F (36.8 C) (Oral)   Resp 17   Ht (P) 6' 1 (1.854 m)   Wt (P) 111.7 kg   SpO2 98%   BMI (P) 32.49 kg/m  Paced at 80 Bp labile on arrival- PA 28/17- give albumin  ~ 200 ml in CT   Intake/Output Summary (Last 24 hours) at 02/06/2024 1822 Last data filed at 02/06/2024 1756 Gross per 24 hour  Intake 3615.28 ml  Output 4115 ml  Net -499.72 ml   Labs pending  Elspeth C. Kerrin, MD [facility] [phone]

## 2024-02-06 NOTE — Progress Notes (Signed)
     [address]       [place] [address]             [phone]       No events  Vitals:   02/06/24 0512 02/06/24 0751  BP: (!) 156/82 (!) (P) 174/94  Pulse: 64 (!) (P) 54  Resp: 18 (P) 18  Temp: 97.6 F (36.4 C) (P) 98.3 F (36.8 C)  SpO2: 95% (P) 96%   Alert NAD Sinus EWOB  OR today for Bentall, hemiarch, CABG  Thamara Leger O Madelline Eshbach
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
## 2024-02-06 NOTE — Anesthesia Procedure Notes (Signed)
 Central Venous Catheter Insertion Performed by: Tilford Franky BIRCH, MD, anesthesiologist Start/End8/04/2024 8:45 AM, 02/06/2024 8:50 AM Patient location: Pre-op. Preanesthetic checklist: patient identified, IV checked, site marked, risks and benefits discussed, surgical consent, monitors and equipment checked, pre-op evaluation, timeout performed and anesthesia consent Hand hygiene performed  and maximum sterile barriers used  PA cath was placed.Swan type:thermodilution Procedure performed without using ultrasound guided technique. Attempts: 1 Patient tolerated the procedure well with no immediate complications.

## 2024-02-07 ENCOUNTER — Encounter (HOSPITAL_COMMUNITY): Payer: Self-pay | Admitting: Thoracic Surgery (Cardiothoracic Vascular Surgery)

## 2024-02-07 ENCOUNTER — Inpatient Hospital Stay (HOSPITAL_COMMUNITY): Payer: PRIVATE HEALTH INSURANCE

## 2024-02-07 DIAGNOSIS — Z951 Presence of aortocoronary bypass graft: Secondary | ICD-10-CM

## 2024-02-07 DIAGNOSIS — F17210 Nicotine dependence, cigarettes, uncomplicated: Secondary | ICD-10-CM | POA: Diagnosis not present

## 2024-02-07 DIAGNOSIS — I7121 Aneurysm of the ascending aorta, without rupture: Secondary | ICD-10-CM | POA: Diagnosis not present

## 2024-02-07 DIAGNOSIS — K219 Gastro-esophageal reflux disease without esophagitis: Secondary | ICD-10-CM | POA: Diagnosis not present

## 2024-02-07 DIAGNOSIS — E119 Type 2 diabetes mellitus without complications: Secondary | ICD-10-CM | POA: Diagnosis not present

## 2024-02-07 DIAGNOSIS — Z95828 Presence of other vascular implants and grafts: Secondary | ICD-10-CM

## 2024-02-07 LAB — POCT I-STAT 7, (LYTES, BLD GAS, ICA,H+H)
Acid-base deficit: 3 mmol/L — ABNORMAL HIGH (ref 0.0–2.0)
Bicarbonate: 21.8 mmol/L (ref 20.0–28.0)
Calcium, Ion: 1.22 mmol/L (ref 1.15–1.40)
HCT: 30 % — ABNORMAL LOW (ref 39.0–52.0)
Hemoglobin: 10.2 g/dL — ABNORMAL LOW (ref 13.0–17.0)
O2 Saturation: 94 %
Patient temperature: 37.7
Potassium: 4.1 mmol/L (ref 3.5–5.1)
Sodium: 142 mmol/L (ref 135–145)
TCO2: 23 mmol/L (ref 22–32)
pCO2 arterial: 38.9 mmHg (ref 32–48)
pH, Arterial: 7.359 (ref 7.35–7.45)
pO2, Arterial: 78 mmHg — ABNORMAL LOW (ref 83–108)

## 2024-02-07 LAB — PREPARE PLATELET PHERESIS: Unit division: 0

## 2024-02-07 LAB — CBC
HCT: 31.7 % — ABNORMAL LOW (ref 39.0–52.0)
HCT: 34 % — ABNORMAL LOW (ref 39.0–52.0)
Hemoglobin: 10.2 g/dL — ABNORMAL LOW (ref 13.0–17.0)
Hemoglobin: 10.7 g/dL — ABNORMAL LOW (ref 13.0–17.0)
MCH: 29.7 pg (ref 26.0–34.0)
MCH: 29.6 pg (ref 26.0–34.0)
MCHC: 32.2 g/dL (ref 30.0–36.0)
MCHC: 31.5 g/dL (ref 30.0–36.0)
MCV: 92.2 fL (ref 80.0–100.0)
MCV: 94.2 fL (ref 80.0–100.0)
Platelets: 162 K/uL (ref 150–400)
Platelets: 169 K/uL (ref 150–400)
RBC: 3.44 MIL/uL — ABNORMAL LOW (ref 4.22–5.81)
RBC: 3.61 MIL/uL — ABNORMAL LOW (ref 4.22–5.81)
RDW: 16.2 % — ABNORMAL HIGH (ref 11.5–15.5)
RDW: 16.6 % — ABNORMAL HIGH (ref 11.5–15.5)
WBC: 10.5 K/uL (ref 4.0–10.5)
WBC: 16.6 K/uL — ABNORMAL HIGH (ref 4.0–10.5)
nRBC: 0 % (ref 0.0–0.2)
nRBC: 0 % (ref 0.0–0.2)

## 2024-02-07 LAB — BPAM FFP
Blood Product Expiration Date: 202508132359
Blood Product Expiration Date: 202508132359
ISSUE DATE / TIME: 202508111601
ISSUE DATE / TIME: 202508111601
Unit Type and Rh: 6200
Unit Type and Rh: 6200

## 2024-02-07 LAB — PREPARE FRESH FROZEN PLASMA

## 2024-02-07 LAB — BPAM PLATELET PHERESIS
Blood Product Expiration Date: 202508132359
ISSUE DATE / TIME: 202508111552
Unit Type and Rh: 5100

## 2024-02-07 LAB — BASIC METABOLIC PANEL WITH GFR
Anion gap: 7 (ref 5–15)
Anion gap: 7 (ref 5–15)
Anion gap: 11 (ref 5–15)
BUN: 23 mg/dL — ABNORMAL HIGH (ref 6–20)
BUN: 21 mg/dL — ABNORMAL HIGH (ref 6–20)
BUN: 27 mg/dL — ABNORMAL HIGH (ref 6–20)
CO2: 22 mmol/L (ref 22–32)
CO2: 16 mmol/L — ABNORMAL LOW (ref 22–32)
CO2: 20 mmol/L — ABNORMAL LOW (ref 22–32)
Calcium: 8.2 mg/dL — ABNORMAL LOW (ref 8.9–10.3)
Calcium: 6.4 mg/dL — CL (ref 8.9–10.3)
Calcium: 8.6 mg/dL — ABNORMAL LOW (ref 8.9–10.3)
Chloride: 110 mmol/L (ref 98–111)
Chloride: 118 mmol/L — ABNORMAL HIGH (ref 98–111)
Chloride: 108 mmol/L (ref 98–111)
Creatinine, Ser: 0.95 mg/dL (ref 0.61–1.24)
Creatinine, Ser: 0.84 mg/dL (ref 0.61–1.24)
Creatinine, Ser: 1.3 mg/dL — ABNORMAL HIGH (ref 0.61–1.24)
GFR, Estimated: >60 mL/min (ref >60)
GFR, Estimated: >60 mL/min (ref >60)
GFR, Estimated: >60 mL/min (ref >60)
Glucose, Bld: 141 mg/dL — ABNORMAL HIGH (ref 70–99)
Glucose, Bld: 104 mg/dL — ABNORMAL HIGH (ref 70–99)
Glucose, Bld: 144 mg/dL — ABNORMAL HIGH (ref 70–99)
Potassium: 4 mmol/L (ref 3.5–5.1)
Potassium: 3.1 mmol/L — ABNORMAL LOW (ref 3.5–5.1)
Potassium: 4.1 mmol/L (ref 3.5–5.1)
Sodium: 139 mmol/L (ref 135–145)
Sodium: 141 mmol/L (ref 135–145)
Sodium: 139 mmol/L (ref 135–145)

## 2024-02-07 LAB — MAGNESIUM
Magnesium: 2.5 mg/dL — ABNORMAL HIGH (ref 1.7–2.4)
Magnesium: 1.9 mg/dL (ref 1.7–2.4)

## 2024-02-07 LAB — GLUCOSE, CAPILLARY
Glucose-Capillary: 115 mg/dL — ABNORMAL HIGH (ref 70–99)
Glucose-Capillary: 121 mg/dL — ABNORMAL HIGH (ref 70–99)
Glucose-Capillary: 125 mg/dL — ABNORMAL HIGH (ref 70–99)
Glucose-Capillary: 143 mg/dL — ABNORMAL HIGH (ref 70–99)
Glucose-Capillary: 145 mg/dL — ABNORMAL HIGH (ref 70–99)
Glucose-Capillary: 148 mg/dL — ABNORMAL HIGH (ref 70–99)
Glucose-Capillary: 153 mg/dL — ABNORMAL HIGH (ref 70–99)
Glucose-Capillary: 164 mg/dL — ABNORMAL HIGH (ref 70–99)
Glucose-Capillary: 164 mg/dL — ABNORMAL HIGH (ref 70–99)
Glucose-Capillary: 186 mg/dL — ABNORMAL HIGH (ref 70–99)

## 2024-02-07 MED ORDER — CHLORHEXIDINE GLUCONATE CLOTH 2 % EX PADS
6.0000 | MEDICATED_PAD | Freq: Every day | CUTANEOUS | Status: DC
  Administered 2024-02-07 – 2024-02-11 (×7): 6 via TOPICAL

## 2024-02-07 MED ORDER — ENOXAPARIN SODIUM 40 MG/0.4ML IJ SOSY
40.0000 mg | PREFILLED_SYRINGE | Freq: Every day | INTRAMUSCULAR | Status: DC
  Administered 2024-02-07 – 2024-02-11 (×7): 40 mg via SUBCUTANEOUS
  Filled 2024-02-07 (×5): qty 0.4

## 2024-02-07 MED ORDER — HYDROMORPHONE HCL 2 MG PO TABS
4.0000 mg | ORAL_TABLET | Freq: Three times a day (TID) | ORAL | Status: DC | PRN
  Administered 2024-02-07 – 2024-02-12 (×18): 4 mg via ORAL
  Filled 2024-02-07 (×13): qty 2

## 2024-02-07 MED ORDER — INSULIN ASPART 100 UNIT/ML IJ SOLN
0.0000 [IU] | INTRAMUSCULAR | Status: DC
  Administered 2024-02-08 (×2): 8 [IU] via SUBCUTANEOUS
  Administered 2024-02-08: 4 [IU] via SUBCUTANEOUS
  Administered 2024-02-08: 8 [IU] via SUBCUTANEOUS
  Administered 2024-02-08: 4 [IU] via SUBCUTANEOUS
  Administered 2024-02-08: 8 [IU] via SUBCUTANEOUS

## 2024-02-07 MED ORDER — INSULIN ASPART 100 UNIT/ML IJ SOLN: 0.0000 [IU] | INTRAMUSCULAR | Status: DC

## 2024-02-07 MED ORDER — ORAL CARE MOUTH RINSE: 15.0000 mL | OROMUCOSAL | Status: DC | PRN
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
## 2024-02-07 NOTE — Progress Notes (Signed)
 NAME:  [name], MRN:  [id number], DOB:  [birth year], LOS: 11 ADMISSION DATE:  01/24/2024, CONSULTATION DATE:  02/06/24 REFERRING MD:  Dr. Shyrl, CHIEF COMPLAINT:  post op   History of Present Illness:  Colin Ramirez is a 58 y.o. male who has a PMH as below including but not limited to CAD (LHC in May 2019 with 2v disease with moderate stenosis), HTN, DM2. He was admitted to [facility] on 01/24/24 with chest pain.   An echocardiogram was performed on 7/30 that demonstrated an EF of 55 to 60%, calcified aortic valve, mildly reduced RV systolic function, mildly dilated LA, aortic dilatation with an aneurysm of the ascending aorta. On 7/31 he had L/RHC that revealed severe multivessel disease. He was evaluated by thoracic surgery for consideration of a CABG/AVR/Bentall.   On 02/06/2024, he was taken to the operating room where he had CABG x 3 (LIMA LAD, RSVG PDA DIAG), Bentall, ascending aortic arch replacement.  Did have some reperfusion arrest and longer pump run leading to oozing post.   Operatively, PCCM was asked to see in consultation for medical management assistance.  Pertinent  Medical History  Primary hypertension; HLD (hyperlipidemia); GERD (gastroesophageal reflux disease); Sleep apnea; Class 1 obesity due to excess calories with serious comorbidity and body mass index (BMI) of 33.0 to 33.9 in adult; Degenerative disc disease; Tobacco abuse; Hypothyroidism; Unilateral primary osteoarthritis, left hip; Status post left hip replacement; Atherosclerosis of native coronary artery with angina pectoris (HCC); Bilateral hearing loss; Facial twitching; Sigmoid diverticulitis; DM2 (diabetes mellitus, type 2) (HCC); Hx of adenomatous colonic polyps; Dermatitis; Localized edema; Microalbuminuria; Atypical chest pain; Hypokalemia; Unstable angina (HCC); Aneurysm of ascending aorta without rupture (HCC); Aortic stenosis with bicuspid valve; and Acute heart failure with preserved ejection fraction  (HCC) on their problem list.  Significant Hospital Events: Including procedures, antibiotic start and stop dates in addition to other pertinent events   7/29 admit 7/31 L/RHC 8/6 to OR for extraction of tooth # 18 8/11 to OR for bentall, CABG x 3, AVR  Interim History / Subjective:  Reports pain is currently controlled at 2/10 No SOB, extubated last evening, on BiPAP overnight.  Uses nasal CPAP at home  Objective    Blood pressure (!) 85/67, pulse 80, temperature 100 F (37.8 C), resp. rate 15, height (P) 6' 1 (1.854 m), weight 116 kg, SpO2 93%. PAP: (17-39)/(10-26) 28/16 CVP:  [2 mmHg-22 mmHg] 7 mmHg CO:  [4 L/min-7.8 L/min] 7.8 L/min CI:  [1.7 L/min/m2-3.3 L/min/m2] 3.3 L/min/m2  Vent Mode: BIPAP;PSV FiO2 (%):  [40 %-50 %] 40 % Set Rate:  [4 bmp-20 bmp] 4 bmp Vt Set:  [630 mL] 630 mL PEEP:  [5 cmH20-8 cmH20] 5 cmH20 Pressure Support:  [5 cmH20-10 cmH20] 7 cmH20 Plateau Pressure:  [18 cmH20] 18 cmH20   Intake/Output Summary (Last 24 hours) at 02/07/2024 0746 Last data filed at 02/07/2024 0700 Gross per 24 hour  Intake 6369.04 ml  Output 5878 ml  Net 491.04 ml   Filed Weights   02/06/24 0512 02/06/24 0751 02/07/24 0500  Weight: 111.7 kg (P) 111.7 kg 116 kg    Examination: General:  Well nourished adult male in bed in NAD HEENT: MM pink/moist, pupils 4/r, left stye lower lid Neuro: Aox3, MAE CV: vpaced, sternal dressing cdi, Ctx3, pacing wires, R internal jugular PA, left radial aline PULM:  non labored, clear, diminished bases, 5L  GI: obese, mild distention, NT, +bs, foley cyu  Extremities: warm/dry, no tibial edema  Skin:  no rashes, stable blisters to right great and 4th toes  Labs reviewed> K 4, Mag 2.5, sCr 0.95, iCa 1.22, H/H 10.2/ 31 CVP 6, CI 2.34, CO 5.5, PA 30/12 NE 5, txa infusion completing, insulin  gtt Tmax 100 UOP 1.5L/ 24hrs, CT 665ml/ 12hrs +459ml/ 12hrs, net +2.6L  Resolved problem list   Assessment and Plan   Multivessel CAD with  ascending thoracic aortic aneurysm and bicuspid aortic valve stenosis - s/p CABG Bentall and Ascending arch replacement for aneurysm Hx essential HTN, HLD Postoperative ventilator management, resolved Expected post op ABLA Residual Post bypass vasoplegia Tobacco dependence - known history of 1.5ppd x 30 years. P:  - post-op management per TCTS - con't weaning pressors as able to maintain SBP > 100.  Albumin  boluses prn - tele monitoring, remains vpaced  - mediastinal drains per TCTS, H/H stable - multimodal pain control per protocol> cont lidocaine  patches, gabapentin , tramadol , and resume home prn TID dilaudid  PO w/ bowel regimen  - ASA, statin - metoprolol  BID once off pressors - complete post-op antibiotics - monitor electrolytes, replete PRN - GDMT per primary - PCCM will continue to follow while in ICU - smoking cessation    Hx DM2 - remains on insulin  gtt, transition when meets parameters    Hx GERD - PPI   Hx Hypothyroidism - Continue PTA Synthroid    Hx bilateral hip pain/sciatica, peripheral neuropathy, chronic pain - Continue Gabapentin , resume prn home chronic prn dilaudid , states takes mostly TID regularly w/ bowel regimen, denies chronic constipation   Unilateral pinkeye Hordeolum. - Continue warm compresses and Erythromycin  ointment  OSA - ABG reassuring - cont nightly CPAP   Best Practice (right click and Reselect all SmartList Selections daily)   Diet/type: Regular consistency (see orders)> advance as tolerated DVT prophylaxis SCD Pressure ulcer(s): N/A GI prophylaxis: PPI Lines: Central line and Arterial Line Foley:  Yes, and it is no longer needed and removal ordered  Code Status:  full code Last date of multidisciplinary goals of care discussion [per primary]  Labs   CBC: Recent Labs  Lab 02/03/24 0244 02/04/24 0252 02/05/24 0253 02/06/24 0946 02/06/24 1508 02/06/24 1521 02/06/24 1802 02/06/24 1831 02/06/24 2147 02/06/24 2302  02/07/24 0438 02/07/24 0448  WBC 6.0 5.9 6.2  --   --   --  13.7*  --   --   --  10.5  --   HGB 12.1* 12.6* 13.1   < > 10.9*   < > 11.3* 10.2* 10.5* 10.5* 10.2* 10.2*  HCT 38.0* 39.1 40.8   < > 34.4*   < > 35.2* 30.0* 31.0* 31.0* 31.7* 30.0*  MCV 92.5 91.1 90.7  --   --   --  93.4  --   --   --  92.2  --   PLT 208 215 214  --  157  --  162  --   --   --  162  --    < > = values in this interval not displayed.    Basic Metabolic Panel: Recent Labs  Lab 02/03/24 0244 02/04/24 0252 02/04/24 0812 02/05/24 0253 02/05/24 2314 02/06/24 0946 02/06/24 1433 02/06/24 1438 02/06/24 1554 02/06/24 1622 02/06/24 1701 02/06/24 1705 02/06/24 1831 02/06/24 2147 02/06/24 2302 02/07/24 0438 02/07/24 0448  NA 140 138  --  139 139   < > 141   < > 140 140   < > 143 143 143 143 139 142  K 4.2 3.9  --  3.7 4.0   < >  3.7   < > 4.7 4.7   < > 3.9 4.0 4.2 4.2 4.0 4.1  CL 106 107  --  103 103   < > 106  --  106 107  --  107  --   --   --  110  --   CO2 28 25  --  26 26  --   --   --   --   --   --   --   --   --   --  22  --   GLUCOSE 190* 152*  --  166* 273*   < > 279*  --  217* 215*  --  177*  --   --   --  141*  --   BUN 16 15  --  16 17   < > 25*  --  27* 27*  --  25*  --   --   --  23*  --   CREATININE 0.84 0.78  --  0.83 1.04   < > 0.80  --  0.80 0.90  --  0.90  --   --   --  0.95  --   CALCIUM  8.6* 8.8*  --  9.2 9.3  --   --   --   --   --   --   --   --   --   --  8.2*  --   MG  --   --  2.0 2.2  --   --   --   --   --   --   --   --   --   --   --  2.5*  --    < > = values in this interval not displayed.   GFR: Estimated Creatinine Clearance: 110.3 mL/min (by C-G formula based on SCr of 0.95 mg/dL). Recent Labs  Lab 02/04/24 0252 02/05/24 0253 02/06/24 1802 02/07/24 0438  WBC 5.9 6.2 13.7* 10.5    Liver Function Tests: Recent Labs  Lab 02/05/24 2314  AST 26  ALT 44  ALKPHOS 44  BILITOT 0.3  PROT 6.5  ALBUMIN  3.2*   No results for input(s): LIPASE, AMYLASE in the last  168 hours. No results for input(s): AMMONIA in the last 168 hours.  ABG    Component Value Date/Time   PHART 7.359 02/07/2024 0448   PCO2ART 38.9 02/07/2024 0448   PO2ART 78 (L) 02/07/2024 0448   HCO3 21.8 02/07/2024 0448   TCO2 23 02/07/2024 0448   ACIDBASEDEF 3.0 (H) 02/07/2024 0448   O2SAT 94 02/07/2024 0448     Coagulation Profile: Recent Labs  Lab 02/05/24 2314 02/06/24 1802  INR 0.9 1.5*    Cardiac Enzymes: No results for input(s): CKTOTAL, CKMB, CKMBINDEX, TROPONINI in the last 168 hours.  HbA1C: Hgb A1c MFr Bld  Date/Time Value Ref Range Status  02/05/2024 11:14 PM 7.6 (H) 4.8 - 5.6 % Final    Comment:    (NOTE)         Prediabetes: 5.7 - 6.4         Diabetes: >6.4         Glycemic control for adults with diabetes: <7.0   01/25/2024 05:11 AM 7.4 (H) 4.8 - 5.6 % Final    Comment:    (NOTE) Diagnosis of Diabetes The following HbA1c ranges recommended by the American Diabetes Association (ADA) may be used as an aid in the diagnosis of diabetes mellitus.  Hemoglobin  Suggested A1C NGSP%              Diagnosis  <5.7                   Non Diabetic  5.7-6.4                Pre-Diabetic  >6.4                   Diabetic  <7.0                   Glycemic control for                       adults with diabetes.      CBG: Recent Labs  Lab 02/07/24 0142 02/07/24 0256 02/07/24 0402 02/07/24 0445 02/07/24 0546  GLUCAP 125* 164* 143* 148* 145*   Allergies Allergies  Allergen Reactions   Omnicef  [Cefdinir ] Rash     Home Medications  Prior to Admission medications   Medication Sig Start Date End Date Taking? Authorizing Provider  amLODipine  (NORVASC ) 10 MG tablet Take 1 tablet (10 mg total) by mouth daily. 08/25/21 01/25/24 Yes Oley Bascom RAMAN, NP  aspirin  81 MG EC tablet TAKE 1 TABLET DAILY. 08/25/21  Yes Oley Bascom RAMAN, NP  atorvastatin  (LIPITOR) 80 MG tablet Take 1 tablet (80 mg total) by mouth daily. Patient taking  differently: Take 80 mg by mouth at bedtime. 08/25/21  Yes Oley Bascom RAMAN, NP  carvedilol  (COREG ) 6.25 MG tablet Take 1 tablet (6.25 mg total) by mouth 2 (two) times daily. 08/25/21  Yes Oley Bascom RAMAN, NP  dapagliflozin  propanediol (FARXIGA ) 10 MG TABS tablet Take 1 tablet (10 mg total) by mouth daily. 08/25/21  Yes Oley Bascom RAMAN, NP  esomeprazole  (NEXIUM ) 40 MG capsule Take 1 capsule (40 mg total) by mouth daily. 08/25/21  Yes Oley Bascom RAMAN, NP  hydrochlorothiazide  (HYDRODIURIL ) 25 MG tablet TAKE 1 TABLET BY MOUTH EVERY DAY 05/04/21  Yes Tanda Bleacher, MD  HYDROmorphone  (DILAUDID ) 4 MG tablet Take 4 mg by mouth every 4 (four) hours as needed for severe pain (pain score 7-10).   Yes [provider]  levothyroxine  (SYNTHROID ) 150 MCG tablet Take 1 tablet (150 mcg total) by mouth daily. 08/25/21  Yes Oley Bascom RAMAN, NP  metFORMIN  (GLUCOPHAGE -XR) 500 MG 24 hr tablet Take 2 tablets (1,000 mg total) by mouth 2 (two) times daily with a meal. 08/25/21  Yes Oley Bascom RAMAN, NP  nitroGLYCERIN  (NITROSTAT ) 0.4 MG SL tablet Place 1 tablet (0.4 mg total) under the tongue every 5 (five) minutes as needed for chest pain. 05/30/19  Yes Theotis Haze ORN, NP  ramipril  (ALTACE ) 10 MG capsule Take 1 capsule (10 mg total) by mouth daily. 08/25/21  Yes Oley Bascom RAMAN, NP  Misc. Devices MISC Dispense CPAP mask, tank, and tubing for patient use nightly. 08/30/19   [name], MD  triamcinolone  cream (KENALOG ) 0.1 % Apply 1 application topically 2 (two) times daily. 02/10/21   Mayers, Kirk RAMAN, PA-C     Critical care time: 35 mins       Lyle Pesa, MSN, AG-ACNP-BC [facility] 02/07/2024, 9:48 AM  See Amion for pager If no response to pager , please call [phone] until 7pm After 7:00 pm call Elink  [phone]

## 2024-02-07 NOTE — Anesthesia Postprocedure Evaluation (Signed)
 Anesthesia Post Note  Patient: Colin Ramirez  Procedure(s) Performed: CORONARY ARTERY BYPASS GRAFTING (CABG) x THREE USING LEFT INTERNAL MAMMARY ARTERY AND ENDOSCOPICALLY HARVESTED RIGHT GREATER SAPHENOUS VEIN (Chest) BENTALL PROCEDURE AND HEMIARCH USING HEMASHIELD PLATINUM VASCULAR GRAFT AND KONECT RESILIA AORTIC VALVE CONDUIT, RIGHT AXILLARY CANNULATION USING HEMASHIELD PLATINUM VASCULAR GRAFT (Chest) ECHOCARDIOGRAM, TRANSESOPHAGEAL, INTRAOPERATIVE     Patient location during evaluation: SICU Anesthesia Type: General Level of consciousness: sedated Pain management: pain level controlled Vital Signs Assessment: post-procedure vital signs reviewed and stable Respiratory status: patient remains intubated per anesthesia plan Cardiovascular status: stable Postop Assessment: no apparent nausea or vomiting Anesthetic complications: no   No notable events documented.              Walburga Hudman D Shanece Cochrane

## 2024-02-07 NOTE — Plan of Care (Signed)
  Problem: Education: Goal: Knowledge of General Education information will improve Description: Including pain rating scale, medication(s)/side effects and non-pharmacologic comfort measures Outcome: Progressing   Problem: Health Behavior/Discharge Planning: Goal: Ability to manage health-related needs will improve Outcome: Progressing   Problem: Activity: Goal: Risk for activity intolerance will decrease Outcome: Progressing   Problem: Nutrition: Goal: Adequate nutrition will be maintained Outcome: Not Progressing   Problem: Coping: Goal: Level of anxiety will decrease Outcome: Progressing   Problem: Elimination: Goal: Will not experience complications related to urinary retention Outcome: Progressing   Problem: Safety: Goal: Ability to remain free from injury will improve Outcome: Progressing   Problem: Skin Integrity: Goal: Risk for impaired skin integrity will decrease Outcome: Progressing

## 2024-02-07 NOTE — Progress Notes (Signed)
 [address]       [place] [address]             [phone]                 1 Day Post-Op Procedure(s) (LRB): CORONARY ARTERY BYPASS GRAFTING (CABG) x THREE USING LEFT INTERNAL MAMMARY ARTERY AND ENDOSCOPICALLY HARVESTED RIGHT GREATER SAPHENOUS VEIN (N/A) BENTALL PROCEDURE AND HEMIARCH USING HEMASHIELD PLATINUM VASCULAR GRAFT AND KONECT RESILIA AORTIC VALVE CONDUIT, RIGHT AXILLARY CANNULATION USING HEMASHIELD PLATINUM VASCULAR GRAFT (N/A) ECHOCARDIOGRAM, TRANSESOPHAGEAL, INTRAOPERATIVE (N/A)   Events: No events extubated _______________________________________________________________ Vitals: BP 101/64 (BP Location: Right Arm)   Pulse 80   Temp 100 F (37.8 C)   Resp (!) 24   Ht (P) 6' 1 (1.854 m)   Wt 116 kg   SpO2 95%   BMI (P) 33.74 kg/m  Filed Weights   02/06/24 0512 02/06/24 0751 02/07/24 0500  Weight: 111.7 kg (P) 111.7 kg 116 kg     - Neuro: alert NAD  - Cardiovascular: paced.  CHB underneath  Drips: levo 5.   PAP: (17-39)/(9-26) 31/14 CVP:  [2 mmHg-22 mmHg] 7 mmHg CO:  [4 L/min-7.8 L/min] 5.5 L/min CI:  [1.7 L/min/m2-3.3 L/min/m2] 2.3 L/min/m2  - Pulm: EWOB  ABG    Component Value Date/Time   PHART 7.359 02/07/2024 0448   PCO2ART 38.9 02/07/2024 0448   PO2ART 78 (L) 02/07/2024 0448   HCO3 21.8 02/07/2024 0448   TCO2 23 02/07/2024 0448   ACIDBASEDEF 3.0 (H) 02/07/2024 0448   O2SAT 94 02/07/2024 0448    - Abd: ND - Extremity: warm  .Intake/Output      08/11 0701 08/12 0700 08/12 0701 08/13 0700   P.O.     I.V. (mL/kg) 3093.6 (26.7) 90.2 (0.8)   Blood 1399    IV Piggyback 1876.5 0   Total Intake(mL/kg) 6369 (54.9) 90.2 (0.8)   Urine (mL/kg/hr) 1568 (0.6) 89 (0.3)   Blood 3350    Chest Tube 960 100   Total Output 5878 189   Net +491 -98.8           _______________________________________________________________ Labs:    Latest Ref Rng & Units 02/07/2024    4:48 AM 02/07/2024    4:38 AM  02/06/2024   11:02 PM  CBC  WBC 4.0 - 10.5 K/uL  10.5    Hemoglobin 13.0 - 17.0 g/dL 89.7  89.7  89.4   Hematocrit 39.0 - 52.0 % 30.0  31.7  31.0   Platelets 150 - 400 K/uL  162        Latest Ref Rng & Units 02/07/2024    4:48 AM 02/07/2024    4:38 AM 02/06/2024   11:02 PM  CMP  Glucose 70 - 99 mg/dL  858    BUN 6 - 20 mg/dL  23    Creatinine 9.38 - 1.24 mg/dL  9.04    Sodium 864 - 854 mmol/L 142  139  143   Potassium 3.5 - 5.1 mmol/L 4.1  4.0  4.2   Chloride 98 - 111 mmol/L  110    CO2 22 - 32 mmol/L  22    Calcium  8.9 - 10.3 mg/dL  8.2      CXR: PV congestion  _______________________________________________________________  Assessment and Plan: POD 1 s/p CABG, Bentall, hemiarch  Neuro: adjusting pain medication CV: will keep pacing.  If no improvement, will consult EP tomorrow.  Wean levo as tolerated.  Will remove swan, and A-line once off levo. Pulm: IS, ambulation Renal: creat stable.  Will diurese once off levo GI: diet Heme: stable ID: [email or web address] Endo: SSI Dispo: continue ICU care.   Linnie MALVA Rayas 02/07/2024 9:31 AM

## 2024-02-08 ENCOUNTER — Inpatient Hospital Stay (HOSPITAL_COMMUNITY): Payer: PRIVATE HEALTH INSURANCE

## 2024-02-08 DIAGNOSIS — R0789 Other chest pain: Secondary | ICD-10-CM | POA: Diagnosis not present

## 2024-02-08 DIAGNOSIS — I442 Atrioventricular block, complete: Secondary | ICD-10-CM | POA: Diagnosis not present

## 2024-02-08 DIAGNOSIS — N179 Acute kidney failure, unspecified: Secondary | ICD-10-CM

## 2024-02-08 DIAGNOSIS — I959 Hypotension, unspecified: Secondary | ICD-10-CM

## 2024-02-08 DIAGNOSIS — I9589 Other hypotension: Secondary | ICD-10-CM | POA: Diagnosis not present

## 2024-02-08 DIAGNOSIS — Z951 Presence of aortocoronary bypass graft: Secondary | ICD-10-CM

## 2024-02-08 LAB — CBC
HCT: 29 % — ABNORMAL LOW (ref 39.0–52.0)
Hemoglobin: 9.3 g/dL — ABNORMAL LOW (ref 13.0–17.0)
MCH: 29.6 pg (ref 26.0–34.0)
MCHC: 32.1 g/dL (ref 30.0–36.0)
MCV: 92.4 fL (ref 80.0–100.0)
Platelets: 162 K/uL (ref 150–400)
RBC: 3.14 MIL/uL — ABNORMAL LOW (ref 4.22–5.81)
RDW: 16.8 % — ABNORMAL HIGH (ref 11.5–15.5)
WBC: 12.5 K/uL — ABNORMAL HIGH (ref 4.0–10.5)
nRBC: 0 % (ref 0.0–0.2)

## 2024-02-08 LAB — BASIC METABOLIC PANEL WITH GFR
Anion gap: 7 (ref 5–15)
BUN: 29 mg/dL — ABNORMAL HIGH (ref 6–20)
CO2: 23 mmol/L (ref 22–32)
Calcium: 8.4 mg/dL — ABNORMAL LOW (ref 8.9–10.3)
Chloride: 107 mmol/L (ref 98–111)
Creatinine, Ser: 1.01 mg/dL (ref 0.61–1.24)
GFR, Estimated: >60 mL/min (ref >60)
Glucose, Bld: 120 mg/dL — ABNORMAL HIGH (ref 70–99)
Potassium: 4.3 mmol/L (ref 3.5–5.1)
Sodium: 137 mmol/L (ref 135–145)

## 2024-02-08 LAB — SURGICAL PATHOLOGY

## 2024-02-08 LAB — GLUCOSE, CAPILLARY
Glucose-Capillary: 126 mg/dL — ABNORMAL HIGH (ref 70–99)
Glucose-Capillary: 139 mg/dL — ABNORMAL HIGH (ref 70–99)
Glucose-Capillary: 129 mg/dL — ABNORMAL HIGH (ref 70–99)
Glucose-Capillary: 130 mg/dL — ABNORMAL HIGH (ref 70–99)
Glucose-Capillary: 131 mg/dL — ABNORMAL HIGH (ref 70–99)
Glucose-Capillary: 126 mg/dL — ABNORMAL HIGH (ref 70–99)
Glucose-Capillary: 96 mg/dL (ref 70–99)
Glucose-Capillary: 114 mg/dL — ABNORMAL HIGH (ref 70–99)
Glucose-Capillary: 113 mg/dL — ABNORMAL HIGH (ref 70–99)
Glucose-Capillary: 166 mg/dL — ABNORMAL HIGH (ref 70–99)
Glucose-Capillary: 205 mg/dL — ABNORMAL HIGH (ref 70–99)
Glucose-Capillary: 234 mg/dL — ABNORMAL HIGH (ref 70–99)
Glucose-Capillary: 225 mg/dL — ABNORMAL HIGH (ref 70–99)

## 2024-02-08 LAB — MAGNESIUM: Magnesium: 2.5 mg/dL — ABNORMAL HIGH (ref 1.7–2.4)

## 2024-02-08 MED ORDER — METHOCARBAMOL 500 MG PO TABS
1000.0000 mg | ORAL_TABLET | Freq: Three times a day (TID) | ORAL | Status: AC
  Administered 2024-02-08 – 2024-02-10 (×9): 1000 mg via ORAL
  Filled 2024-02-08 (×9): qty 2

## 2024-02-08 MED ORDER — INSULIN GLARGINE-YFGN 100 UNIT/ML ~~LOC~~ SOLN
15.0000 [IU] | Freq: Once | SUBCUTANEOUS | Status: AC
  Administered 2024-02-08 (×2): 15 [IU] via SUBCUTANEOUS
  Filled 2024-02-08: qty 0.15

## 2024-02-08 MED ORDER — INSULIN ASPART 100 UNIT/ML IJ SOLN
0.0000 [IU] | Freq: Every day | INTRAMUSCULAR | Status: DC
  Administered 2024-02-08 (×2): 2 [IU] via SUBCUTANEOUS

## 2024-02-08 MED ORDER — INSULIN GLARGINE-YFGN 100 UNIT/ML ~~LOC~~ SOLN
25.0000 [IU] | Freq: Every day | SUBCUTANEOUS | Status: DC
  Administered 2024-02-08 (×2): 25 [IU] via SUBCUTANEOUS
  Filled 2024-02-08: qty 0.25

## 2024-02-08 MED ORDER — METHOCARBAMOL 500 MG PO TABS
500.0000 mg | ORAL_TABLET | Freq: Three times a day (TID) | ORAL | Status: DC | PRN
  Administered 2024-02-08 (×2): 500 mg via ORAL
  Filled 2024-02-08: qty 1

## 2024-02-08 MED ORDER — HYDROMORPHONE HCL 1 MG/ML IJ SOLN
INTRAMUSCULAR | Status: AC
Start: 2024-02-08 — End: 2024-02-08
  Filled 2024-02-08: qty 1

## 2024-02-08 MED ORDER — HYDROMORPHONE HCL 1 MG/ML IJ SOLN
1.0000 mg | Freq: Once | INTRAMUSCULAR | Status: AC
  Administered 2024-02-08 (×2): 1 mg via INTRAVENOUS

## 2024-02-08 MED ORDER — INSULIN GLARGINE-YFGN 100 UNIT/ML ~~LOC~~ SOLN: 8.0000 [IU] | Freq: Once | SUBCUTANEOUS | Status: DC

## 2024-02-08 MED ORDER — MIDODRINE HCL 5 MG PO TABS
10.0000 mg | ORAL_TABLET | Freq: Three times a day (TID) | ORAL | Status: DC
  Administered 2024-02-08 – 2024-02-12 (×14): 10 mg via ORAL
  Filled 2024-02-08 (×12): qty 2

## 2024-02-08 MED ORDER — HYDROMORPHONE HCL 1 MG/ML IJ SOLN
1.0000 mg | INTRAMUSCULAR | Status: DC | PRN
  Administered 2024-02-10: 1 mg via INTRAVENOUS
  Filled 2024-02-08: qty 1

## 2024-02-08 MED ORDER — INSULIN ASPART 100 UNIT/ML IJ SOLN
0.0000 [IU] | Freq: Three times a day (TID) | INTRAMUSCULAR | Status: DC
  Administered 2024-02-09 – 2024-02-11 (×4): 4 [IU] via SUBCUTANEOUS
  Administered 2024-02-11: 7 [IU] via SUBCUTANEOUS

## 2024-02-08 MED ORDER — INSULIN GLARGINE-YFGN 100 UNIT/ML ~~LOC~~ SOLN
30.0000 [IU] | Freq: Two times a day (BID) | SUBCUTANEOUS | Status: DC
  Administered 2024-02-09 – 2024-02-10 (×4): 30 [IU] via SUBCUTANEOUS
  Filled 2024-02-08 (×6): qty 0.3

## 2024-02-08 MED ORDER — HYDROMORPHONE HCL 1 MG/ML IJ SOLN
1.0000 mg | Freq: Once | INTRAMUSCULAR | Status: AC
  Administered 2024-02-08 (×2): 1 mg via INTRAVENOUS
  Filled 2024-02-08: qty 1

## 2024-02-08 NOTE — Progress Notes (Signed)
 NAME:  Colin Ramirez, MRN:  [id number], DOB:  [birth year], LOS: 12 ADMISSION DATE:  01/24/2024, CONSULTATION DATE:  02/06/24 REFERRING MD:  Dr. Shyrl, CHIEF COMPLAINT:  post op   History of Present Illness:  Colin Ramirez is a 58 y.o. male who has a PMH as below including but not limited to CAD (LHC in May 2019 with 2v disease with moderate stenosis), HTN, DM2. He was admitted to [facility] on 01/24/24 with chest pain.   An echocardiogram was performed on 7/30 that demonstrated an EF of 55 to 60%, calcified aortic valve, mildly reduced RV systolic function, mildly dilated LA, aortic dilatation with an aneurysm of the ascending aorta. On 7/31 he had L/RHC that revealed severe multivessel disease. He was evaluated by thoracic surgery for consideration of a CABG/AVR/Bentall.   On 02/06/2024, he was taken to the operating room where he had CABG x 3 (LIMA LAD, RSVG PDA DIAG), Bentall, ascending aortic arch replacement.  Did have some reperfusion arrest and longer pump run leading to oozing post.   Operatively, PCCM was asked to see in consultation for medical management assistance.  Pertinent  Medical History  Primary hypertension; HLD (hyperlipidemia); GERD (gastroesophageal reflux disease); Sleep apnea; Class 1 obesity due to excess calories with serious comorbidity and body mass index (BMI) of 33.0 to 33.9 in adult; Degenerative disc disease; Tobacco abuse; Hypothyroidism; Unilateral primary osteoarthritis, left hip; Status post left hip replacement; Atherosclerosis of native coronary artery with angina pectoris (HCC); Bilateral hearing loss; Facial twitching; Sigmoid diverticulitis; DM2 (diabetes mellitus, type 2) (HCC); Hx of adenomatous colonic polyps; Dermatitis; Localized edema; Microalbuminuria; Atypical chest pain; Hypokalemia; Unstable angina (HCC); Aneurysm of ascending aorta without rupture (HCC); Aortic stenosis with bicuspid valve; and Acute heart failure with preserved ejection fraction  (HCC) on their problem list.  Significant Hospital Events: Including procedures, antibiotic start and stop dates in addition to other pertinent events   7/29 admit 7/31 L/RHC 8/6 to OR for extraction of tooth # 18 8/11 to OR for bentall, CABG x 3, AVR 8/13 extubated, remains on Levophed  and V paced  Interim History / Subjective:  Complaining of significant sternotomy pain currently  Remains on 6L [place] and Norepi V paced at 80, underlying bradycardia and likely in CHB  625cc CT output yesterday and 756cc UOP Passing gas but no BM CXR with possible small L pleural effusion  Objective    Blood pressure (!) 88/62, pulse 80, temperature 99.2 F (37.3 C), temperature source Oral, resp. rate (!) 23, height (P) 6' 1 (1.854 m), weight 115.9 kg, SpO2 92%. PAP: (22-46)/(11-19) 33/19 CVP:  [3 mmHg-15 mmHg] 12 mmHg CO:  [5 L/min] 5 L/min CI:  [2.1 L/min/m2] 2.1 L/min/m2      Intake/Output Summary (Last 24 hours) at 02/08/2024 0821 Last data filed at 02/08/2024 0804 Gross per 24 hour  Intake 673.83 ml  Output 1356 ml  Net -682.17 ml   Filed Weights   02/06/24 0751 02/07/24 0500 02/08/24 0500  Weight: (P) 111.7 kg 116 kg 115.9 kg    Examination: General:  ill-appearing M resting in bed in NAD HEENT: MM pink/moist, PERRLA Neuro: Aox3, MAE CV: vpaced, sternal dressing cdi, Ctx3, pacing wires, R internal jugular PA, left radial aline PULM:  shallow inspirations with mildly decreased air entry in the bilateral bases, on 6L without distress GI: obese, mild distention, non-tender to palpation Extremities: warm/dry, no edema   Labs reviewed:  Hgb stable at 9.3 K 4.3 Creatinine improved from 1.3  to 1.01  Resolved problem list   Assessment and Plan   Multivessel CAD with ascending thoracic aortic aneurysm and bicuspid aortic valve stenosis - s/p CABG Bentall and Ascending arch replacement for aneurysm Hx essential HTN, HLD Postoperative ventilator management,  resolved Expected post op ABLA Residual Post bypass vasoplegia Tobacco dependence - known history of 1.5ppd x 30 years. Remains V-paced and on low dose norepi  - post-op management per TCTS -plan to leave CT today, pacing dependent, will consult EP -start Midodrine  to hopefull wean off norepi - tele monitoring - mediastinal drains per TCTS, H/H stable - multimodal pain control per protocol> cont lidocaine  patches, gabapentin , tramadol , and resume home prn TID dilaudid  PO w/ bowel regimen, given 1mg  IV dilaudid  for breakthrough pain - ASA, statin - metoprolol  BID once off pressors - complete post-op antibiotics - monitor electrolytes, replete PRN - GDMT per primary - PCCM will continue to follow while in ICU - smoking cessation    Hx DM2 - transition of insulin  gtt today   Hx GERD - PPI   Hx Hypothyroidism - Continue PTA Synthroid    Hx bilateral hip pain/sciatica, peripheral neuropathy, chronic pain - Continue Gabapentin , resume prn home chronic prn dilaudid , states takes mostly TID regularly w/ bowel regimen, denies chronic constipation   Unilateral pinkeye Hordeolum. - Continue warm compresses and Erythromycin  ointment  OSA - ABG reassuring - cont nightly CPAP  AKI Creatinine improved today -continue to monitor UOP and renal indices and avoid nephrotoxins    Best Practice (right click and Reselect all SmartList Selections daily)   Diet/type: Regular consistency (see orders) DVT prophylaxis SCD Pressure ulcer(s): N/A GI prophylaxis: PPI Lines: Central line and Arterial Line Foley:  Yes, and it is no longer needed and removal ordered  Code Status:  full code Last date of multidisciplinary goals of care discussion [per primary]  Labs   CBC: Recent Labs  Lab 02/05/24 0253 02/06/24 0946 02/06/24 1508 02/06/24 1521 02/06/24 1802 02/06/24 1831 02/06/24 2302 02/07/24 0438 02/07/24 0448 02/07/24 1727 02/08/24 0347  WBC 6.2  --   --   --  13.7*  --   --   10.5  --  16.6* 12.5*  HGB 13.1   < > 10.9*   < > 11.3*   < > 10.5* 10.2* 10.2* 10.7* 9.3*  HCT 40.8   < > 34.4*   < > 35.2*   < > 31.0* 31.7* 30.0* 34.0* 29.0*  MCV 90.7  --   --   --  93.4  --   --  92.2  --  94.2 92.4  PLT 214  --  157  --  162  --   --  162  --  169 162   < > = values in this interval not displayed.    Basic Metabolic Panel: Recent Labs  Lab 02/04/24 0812 02/05/24 0253 02/05/24 2314 02/06/24 0946 02/06/24 1705 02/06/24 1831 02/07/24 0438 02/07/24 0448 02/07/24 1638 02/07/24 1836 02/08/24 0347  NA  --  139 139   < > 143   < > 139 142 141 139 137  K  --  3.7 4.0   < > 3.9   < > 4.0 4.1 3.1* 4.1 4.3  CL  --  103 103   < > 107  --  110  --  118* 108 107  CO2  --  26 26  --   --   --  22  --  16* 20* 23  GLUCOSE  --  166* 273*   < > 177*  --  141*  --  104* 144* 120*  BUN  --  16 17   < > 25*  --  23*  --  21* 27* 29*  CREATININE  --  0.83 1.04   < > 0.90  --  0.95  --  0.84 1.30* 1.01  CALCIUM   --  9.2 9.3  --   --   --  8.2*  --  6.4* 8.6* 8.4*  MG 2.0 2.2  --   --   --   --  2.5*  --  1.9  --   --    < > = values in this interval not displayed.   GFR: Estimated Creatinine Clearance: 103.7 mL/min (by C-G formula based on SCr of 1.01 mg/dL). Recent Labs  Lab 02/06/24 1802 02/07/24 0438 02/07/24 1727 02/08/24 0347  WBC 13.7* 10.5 16.6* 12.5*    Liver Function Tests: Recent Labs  Lab 02/05/24 2314  AST 26  ALT 44  ALKPHOS 44  BILITOT 0.3  PROT 6.5  ALBUMIN  3.2*   No results for input(s): LIPASE, AMYLASE in the last 168 hours. No results for input(s): AMMONIA in the last 168 hours.  ABG    Component Value Date/Time   PHART 7.359 02/07/2024 0448   PCO2ART 38.9 02/07/2024 0448   PO2ART 78 (L) 02/07/2024 0448   HCO3 21.8 02/07/2024 0448   TCO2 23 02/07/2024 0448   ACIDBASEDEF 3.0 (H) 02/07/2024 0448   O2SAT 94 02/07/2024 0448     Coagulation Profile: Recent Labs  Lab 02/05/24 2314 02/06/24 1802  INR 0.9 1.5*    Cardiac  Enzymes: No results for input(s): CKTOTAL, CKMB, CKMBINDEX, TROPONINI in the last 168 hours.  HbA1C: Hgb A1c MFr Bld  Date/Time Value Ref Range Status  02/05/2024 11:14 PM 7.6 (H) 4.8 - 5.6 % Final    Comment:    (NOTE)         Prediabetes: 5.7 - 6.4         Diabetes: >6.4         Glycemic control for adults with diabetes: <7.0   01/25/2024 05:11 AM 7.4 (H) 4.8 - 5.6 % Final    Comment:    (NOTE) Diagnosis of Diabetes The following HbA1c ranges recommended by the American Diabetes Association (ADA) may be used as an aid in the diagnosis of diabetes mellitus.  Hemoglobin             Suggested A1C NGSP%              Diagnosis  <5.7                   Non Diabetic  5.7-6.4                Pre-Diabetic  >6.4                   Diabetic  <7.0                   Glycemic control for                       adults with diabetes.      CBG: Recent Labs  Lab 02/08/24 0116 02/08/24 0316 02/08/24 0344 02/08/24 0555 02/08/24 0754  GLUCAP 126* 96 114* 113* 166*   Allergies Allergies  Allergen Reactions   Omnicef  [Cefdinir ] Rash     Home Medications  Prior  to Admission medications   Medication Sig Start Date End Date Taking? Authorizing Provider  amLODipine  (NORVASC ) 10 MG tablet Take 1 tablet (10 mg total) by mouth daily. 08/25/21 01/25/24 Yes Oley Bascom RAMAN, NP  aspirin  81 MG EC tablet TAKE 1 TABLET DAILY. 08/25/21  Yes Oley Bascom RAMAN, NP  atorvastatin  (LIPITOR) 80 MG tablet Take 1 tablet (80 mg total) by mouth daily. Patient taking differently: Take 80 mg by mouth at bedtime. 08/25/21  Yes Oley Bascom RAMAN, NP  carvedilol  (COREG ) 6.25 MG tablet Take 1 tablet (6.25 mg total) by mouth 2 (two) times daily. 08/25/21  Yes Oley Bascom RAMAN, NP  dapagliflozin  propanediol (FARXIGA ) 10 MG TABS tablet Take 1 tablet (10 mg total) by mouth daily. 08/25/21  Yes Oley Bascom RAMAN, NP  esomeprazole  (NEXIUM ) 40 MG capsule Take 1 capsule (40 mg total) by mouth daily. 08/25/21  Yes  Oley Bascom RAMAN, NP  hydrochlorothiazide  (HYDRODIURIL ) 25 MG tablet TAKE 1 TABLET BY MOUTH EVERY DAY 05/04/21  Yes Tanda Bleacher, MD  HYDROmorphone  (DILAUDID ) 4 MG tablet Take 4 mg by mouth every 4 (four) hours as needed for severe pain (pain score 7-10).   Yes [provider]  levothyroxine  (SYNTHROID ) 150 MCG tablet Take 1 tablet (150 mcg total) by mouth daily. 08/25/21  Yes Oley Bascom RAMAN, NP  metFORMIN  (GLUCOPHAGE -XR) 500 MG 24 hr tablet Take 2 tablets (1,000 mg total) by mouth 2 (two) times daily with a meal. 08/25/21  Yes Oley Bascom RAMAN, NP  nitroGLYCERIN  (NITROSTAT ) 0.4 MG SL tablet Place 1 tablet (0.4 mg total) under the tongue every 5 (five) minutes as needed for chest pain. 05/30/19  Yes Theotis Haze ORN, NP  ramipril  (ALTACE ) 10 MG capsule Take 1 capsule (10 mg total) by mouth daily. 08/25/21  Yes Oley Bascom RAMAN, NP  Misc. Devices MISC Dispense CPAP mask, tank, and tubing for patient use nightly. 08/30/19   [name], MD  triamcinolone  cream (KENALOG ) 0.1 % Apply 1 application topically 2 (two) times daily. 02/10/21   Mayers, Kirk RAMAN, PA-C     Critical care time: 32 mins     CRITICAL CARE Performed by: Leita SAUNDERS Alfredo Collymore   Total critical care time: 32 minutes  Critical care time was exclusive of separately billable procedures and treating other patients.  Critical care was necessary to treat or prevent imminent or life-threatening deterioration.  Critical care was time spent personally by me on the following activities: development of treatment plan with patient and/or surrogate as well as nursing, discussions with consultants, evaluation of patient's response to treatment, examination of patient, obtaining history from patient or surrogate, ordering and performing treatments and interventions, ordering and review of laboratory studies, ordering and review of radiographic studies, pulse oximetry and re-evaluation of patient's condition.   Leita SAUNDERS Dyshawn Cangelosi,  PA-C [facility] See Amion for pager If no response to pager , please call [phone] until 7pm After 7:00 pm call Elink  [phone]

## 2024-02-08 NOTE — Progress Notes (Signed)
 Per Dr. Shyrl, goal SBP > 100.

## 2024-02-08 NOTE — Progress Notes (Addendum)
 [address] [place] [employer] [address]             [phone]      2 Days Post-Op Procedure(s) (LRB): CORONARY ARTERY BYPASS GRAFTING (CABG) x THREE USING LEFT INTERNAL MAMMARY ARTERY AND ENDOSCOPICALLY HARVESTED RIGHT GREATER SAPHENOUS VEIN (N/A) BENTALL PROCEDURE AND HEMIARCH USING HEMASHIELD PLATINUM VASCULAR GRAFT AND KONECT RESILIA AORTIC VALVE CONDUIT, RIGHT AXILLARY CANNULATION USING HEMASHIELD PLATINUM VASCULAR GRAFT (N/A) ECHOCARDIOGRAM, TRANSESOPHAGEAL, INTRAOPERATIVE (N/A) Subjective: Patient reports he has some pain this AM around his sternal incision and chest tubes  Objective: Vital signs in last 24 hours: Temp:  [97.8 F (36.6 C)-100.4 F (38 C)] 99 F (37.2 C) (08/13 0315) Pulse Rate:  [76-80] 80 (08/13 0635) Cardiac Rhythm: Ventricular paced (08/12 2000) Resp:  [9-32] 23 (08/13 0635) BP: (76-140)/(46-98) 88/62 (08/13 0635) SpO2:  [86 %-96 %] 92 % (08/13 0635) Arterial Line BP: (65-134)/(36-75) 120/60 (08/13 0635) Weight:  [115.9 kg] 115.9 kg (08/13 0500)  Hemodynamic parameters for last 24 hours: PAP: (22-46)/(9-19) 33/19 CVP:  [3 mmHg-15 mmHg] 12 mmHg CO:  [5 L/min-5.5 L/min] 5 L/min CI:  [2.1 L/min/m2-2.3 L/min/m2] 2.1 L/min/m2  Intake/Output from previous day: 08/12 0701 - 08/13 0700 In: 701.8 [I.V.:501.9; IV Piggyback:199.9] Out: 1381 [Urine:756; Chest Tube:625] Intake/Output this shift: No intake/output data recorded.  General appearance: alert, cooperative, and no distress Neurologic: intact and left sided facial twitch Heart: V paced at 80, no murmur Lungs: clear to auscultation bilaterally Abdomen: soft, non-tender; bowel sounds normal; no masses,  no organomegaly Extremities: edema trace BLE Wound: Clean and dry dressings in place  Lab Results: Recent Labs    02/07/24 1727 02/08/24 0347  WBC 16.6* 12.5*  HGB 10.7* 9.3*  HCT 34.0* 29.0*  PLT 169 162   BMET:  Recent Labs    02/07/24 1836  02/08/24 0347  NA 139 137  K 4.1 4.3  CL 108 107  CO2 20* 23  GLUCOSE 144* 120*  BUN 27* 29*  CREATININE 1.30* 1.01  CALCIUM  8.6* 8.4*    PT/INR:  Recent Labs    02/06/24 1802  LABPROT 18.5*  INR 1.5*   ABG    Component Value Date/Time   PHART 7.359 02/07/2024 0448   HCO3 21.8 02/07/2024 0448   TCO2 23 02/07/2024 0448   ACIDBASEDEF 3.0 (H) 02/07/2024 0448   O2SAT 94 02/07/2024 0448   CBG (last 3)  Recent Labs    02/08/24 0316 02/08/24 0344 02/08/24 0555  GLUCAP 96 114* 113*    Assessment/Plan: S/P Procedure(s) (LRB): CORONARY ARTERY BYPASS GRAFTING (CABG) x THREE USING LEFT INTERNAL MAMMARY ARTERY AND ENDOSCOPICALLY HARVESTED RIGHT GREATER SAPHENOUS VEIN (N/A) BENTALL PROCEDURE AND HEMIARCH USING HEMASHIELD PLATINUM VASCULAR GRAFT AND KONECT RESILIA AORTIC VALVE CONDUIT, RIGHT AXILLARY CANNULATION USING HEMASHIELD PLATINUM VASCULAR GRAFT (N/A) ECHOCARDIOGRAM, TRANSESOPHAGEAL, INTRAOPERATIVE (N/A)  Neuro: Intact, left sided facial twitch patient reports this is his baseline. Chronic pain, on Dilaudid  4mg  Q4H PRN at home. Pain this AM, continue current pain regimen.  CV: VVI paced at 80. HR dropped when pacer was turned down, likely still CHB under pacer. Likely will need to consult EP. Leave EPW in place. Hold BB. Was weaned off levophed  this AM but now MAP dropping less than 65 based off cuff pressures. May need to restart Levo to keep MAP greater than 65 per critical care's note yesterday. Arterial line no longer functioning, plan to d/c arterial line.  Pulm: Saturating well on 6L  and CPAP at night. CXR with left basilar atelectasis. Chest tube output 625cc/24hrs. No air leak. Leave chest tubes in place. Encourage IS and ambulation.   GI: Tolerating a diet but minimal appetite. Denies nausea/vomiting. Passing gas. -BM  Renal: Cr 1.01 improved from 1.3 yesterday. UO 756cc/24hrs. +11lbs from preop weight. Hold Lasix  due to soft BP.   Endo:  Hypothyroid, on Synthroid . CBGs controlled on insulin  drip, may be able to transition off insulin  drip today.   ID: Likely reactive leukocytosis improving. Tmax 100.5   Expected postop ABLA: H/H 9.3/29. Not clinically significant at this time.   DVT Prophylaxis: Lovenox   Dispo: Continue ICU care   LOS: 12 days    Con GORMAN Bend, PA-C 02/08/2024   Agree In heart block with escape in the 30s.  Will consult EP today Will start midodrine  for BP.  Goal SBP > 100.  Encouraged PO intake Continue ICU care.  Will keep wires.  Colin Ramirez

## 2024-02-08 NOTE — Progress Notes (Addendum)
 [address] [place] [address]             [phone]      2 Days Post-Op Procedure(s) (LRB): CORONARY ARTERY BYPASS GRAFTING (CABG) x THREE USING LEFT INTERNAL MAMMARY ARTERY AND ENDOSCOPICALLY HARVESTED RIGHT GREATER SAPHENOUS VEIN (N/A) BENTALL PROCEDURE AND HEMIARCH USING HEMASHIELD PLATINUM VASCULAR GRAFT AND KONECT RESILIA AORTIC VALVE CONDUIT, RIGHT AXILLARY CANNULATION USING HEMASHIELD PLATINUM VASCULAR GRAFT (N/A) ECHOCARDIOGRAM, TRANSESOPHAGEAL, INTRAOPERATIVE (N/A) Subjective: Patient reports he has some pain this AM around his sternal incision and chest tubes  Objective: Vital signs in last 24 hours: Temp:  [97.8 F (36.6 C)-100.4 F (38 C)] 99 F (37.2 C) (08/13 0315) Pulse Rate:  [76-80] 80 (08/13 0635) Cardiac Rhythm: Ventricular paced (08/12 2000) Resp:  [9-32] 23 (08/13 0635) BP: (76-140)/(46-98) 88/62 (08/13 0635) SpO2:  [86 %-96 %] 92 % (08/13 0635) Arterial Line BP: (65-134)/(36-75) 120/60 (08/13 0635) Weight:  [115.9 kg] 115.9 kg (08/13 0500)  Hemodynamic parameters for last 24 hours: PAP: (22-46)/(9-19) 33/19 CVP:  [3 mmHg-15 mmHg] 12 mmHg CO:  [5 L/min-5.5 L/min] 5 L/min CI:  [2.1 L/min/m2-2.3 L/min/m2] 2.1 L/min/m2  Intake/Output from previous day: 08/12 0701 - 08/13 0700 In: 701.8 [I.V.:501.9; IV Piggyback:199.9] Out: 1381 [Urine:756; Chest Tube:625] Intake/Output this shift: No intake/output data recorded.  General appearance: alert, cooperative, and no distress Neurologic: intact and left sided facial twitch Heart: V paced at 80, no murmur Lungs: clear to auscultation bilaterally Abdomen: soft, non-tender; bowel sounds normal; no masses,  no organomegaly Extremities: edema trace BLE Wound: Clean and dry dressings in place  Lab Results: Recent Labs    02/07/24 1727 02/08/24 0347  WBC 16.6* 12.5*  HGB 10.7* 9.3*  HCT 34.0* 29.0*  PLT 169 162   BMET:  Recent Labs    02/07/24 1836  02/08/24 0347  NA 139 137  K 4.1 4.3  CL 108 107  CO2 20* 23  GLUCOSE 144* 120*  BUN 27* 29*  CREATININE 1.30* 1.01  CALCIUM  8.6* 8.4*    PT/INR:  Recent Labs    02/06/24 1802  LABPROT 18.5*  INR 1.5*   ABG    Component Value Date/Time   PHART 7.359 02/07/2024 0448   HCO3 21.8 02/07/2024 0448   TCO2 23 02/07/2024 0448   ACIDBASEDEF 3.0 (H) 02/07/2024 0448   O2SAT 94 02/07/2024 0448   CBG (last 3)  Recent Labs    02/08/24 0316 02/08/24 0344 02/08/24 0555  GLUCAP 96 114* 113*    Assessment/Plan: S/P Procedure(s) (LRB): CORONARY ARTERY BYPASS GRAFTING (CABG) x THREE USING LEFT INTERNAL MAMMARY ARTERY AND ENDOSCOPICALLY HARVESTED RIGHT GREATER SAPHENOUS VEIN (N/A) BENTALL PROCEDURE AND HEMIARCH USING HEMASHIELD PLATINUM VASCULAR GRAFT AND KONECT RESILIA AORTIC VALVE CONDUIT, RIGHT AXILLARY CANNULATION USING HEMASHIELD PLATINUM VASCULAR GRAFT (N/A) ECHOCARDIOGRAM, TRANSESOPHAGEAL, INTRAOPERATIVE (N/A)  Neuro: Intact, left sided facial twitch patient reports this is his baseline. Chronic pain, on Dilaudid  4mg  Q4H PRN at home. Pain this AM, continue current pain regimen.  CV: VVI paced at 80. HR dropped when pacer was turned down, likely still CHB under pacer. Likely will need to consult EP. Leave EPW in place. Hold BB. Was weaned off levophed  this AM but now MAP dropping less than 65 based off cuff pressures. May need to restart Levo to keep MAP greater than 65 per critical care's note yesterday. Arterial line no longer functioning, plan to d/c arterial line.  Pulm: Saturating well on 6L  and CPAP at night. CXR with left basilar atelectasis. Chest tube output 625cc/24hrs. No air leak. Leave chest tubes in place. Encourage IS and ambulation.   GI: Tolerating a diet but minimal appetite. Denies nausea/vomiting. Passing gas. -BM  Renal: Cr 1.01 improved from 1.3 yesterday. UO 756cc/24hrs. +11lbs from preop weight. Hold Lasix  due to soft BP.   Endo:  Hypothyroid, on Synthroid . CBGs controlled on insulin  drip, may be able to transition off insulin  drip today.   ID: Likely reactive leukocytosis improving. Tmax 100.5   Expected postop ABLA: H/H 9.3/29. Not clinically significant at this time.   DVT Prophylaxis: Lovenox   Dispo: Continue ICU care   LOS: 12 days    Con GORMAN Bend, PA-C 02/08/2024   Agree In heart block with escape in the 30s.  Will consult EP today Will start midodrine  for BP.  Goal SBP > 100.  Encouraged PO intake Continue ICU care.  Will keep wires.  Colin Ramirez MALVA Rayas

## 2024-02-08 NOTE — Consult Note (Addendum)
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 +---------+--------+--+--------+--+---------+  ABI Findings: +------------------+-----+---------+ Rt Pressure (mmHg)IndexWaveform  +------------------+-----+---------+ 117                              +------------------+-----+---------+ 146               1.25 triphasic +------------------+-----+---------+ 136               1.16 triphasic +------------------+-----+---------+ +------------------+-----+---------+-------+ Lt Pressure (mmHg)IndexWaveform Comment +------------------+-----+---------+-------+                                 IV      +------------------+-----+---------+-------+ 153               1.31 triphasic        +------------------+-----+---------+-------+ 131               1.12 triphasic        +------------------+-----+---------+-------+  Right Doppler Findings: +--------+--------+ Site    Pressure +--------+--------+ [id number]      +--------+--------+  Left Doppler Findings: +--------+--------+ Site    Comments +--------+--------+  BrachialIV       +--------+--------+   Summary: Right Carotid: Velocities in the right ICA are consistent with a 1-39% stenosis. Left Carotid: Velocities in the left ICA are consistent with a 1-39% stenosis. Vertebrals:  Bilateral vertebral arteries demonstrate antegrade  flow. Subclavians: Normal flow hemodynamics were seen in bilateral subclavian              arteries. Right ABI: Resting right ankle-brachial index is within normal range. Left ABI: Resting left ankle-brachial index is within normal range. Right Upper Extremity: Doppler waveforms remain within normal limits with right radial compression. Doppler waveforms remain within normal limits with right ulnar compression. Left Upper Extremity: Doppler waveforms remain within normal limits with left radial compression. Doppler waveforms remain within normal limits with left ulnar compression.  Electronically signed by Debby Robertson on 01/27/2024 at 4:52:44 PM.    Final    DG Orthopantogram Result Date: 01/27/2024 CLINICAL DATA:  Pre screening for cardiac surgery EXAM: ORTHOPANTOGRAM/PANORAMIC COMPARISON:  None Available. FINDINGS: Multiple missing mandibular teeth. Dental amalgam is seen in maxillary and mandibular molars. Fractured tooth ADA #19. No abnormal periapical lucency. IMPRESSION: 1. Fractured tooth ADA #19. 2. Multiple missing mandibular teeth. Electronically Signed   By: Limin  Xu M.D.   On: 01/27/2024 15:26   CARDIAC CATHETERIZATION Result Date: 01/26/2024   Prox LAD to Mid LAD lesion is 75% stenosed.   Ost 1st Diag to 1st Diag lesion is 30% stenosed.   Ost 2nd Diag to 2nd Diag lesion is 30% stenosed.   Mid RCA to Dist RCA lesion is 50% stenosed.   Prox RCA lesion is 40% stenosed.   RPDA lesion is 90% stenosed.   RPAV lesion is 70% stenosed.   Dist RCA lesion is 50% stenosed.   Prox Cx lesion is 90% stenosed.   Ost 3rd Mrg lesion is 90% stenosed.   Prox LAD lesion is 70% stenosed. Severe mid LAD stenosis Severe proximal Circumflex stenosis  and severe stenosis involving the most distal obtuse marginal branch. Large dominant RCA with moderate mid and distal stenosis. Severe stenosis in the moderate caliber posterolateral artery and severe stenosis in the moderate caliber PDA. Mild elevation of right and left heart pressures Mild to moderate aortic stenosis (14 mm Hg peak to peak gradient, 16 mmHg mean gradient). Recommendations: Will review cath images with the IC team tomorrow. Given multi-vessel CAD, dilated aortic root and moderate aortic stenosis, will need to consider CT surgery consult for Bentall procedure + CABG. We will call the CT surgery team tomorrow given the late timing of the case being finished tonight.   ECHOCARDIOGRAM COMPLETE Result Date: 01/25/2024    ECHOCARDIOGRAM REPORT   Patient Name:   Colin Ramirez Date of Exam: 01/25/2024 Medical Rec #:  [id number]         Height:       73.0 in Accession #:    [id number]        Weight:       253.5 lb Date of Birth:  09/25/1965         BSA:          2.380 m Patient Age:    58 years          BP:           145/85 mmHg Patient Gender: M                 HR:           55 bpm. Exam Location:  Inpatient Procedure: 2D Echo, Cardiac Doppler, Color Doppler and Intracardiac            Opacification Agent (Both Spectral and Color Flow Doppler were            utilized  during procedure). Indications:    chest pain  History:        Patient has prior history of Echocardiogram examinations, most                 recent 06/08/2018. Risk Factors:Dyslipidemia and Hypertension.  Sonographer:    Therisa Crouch Referring Phys: [id number] JUSTIN B HOWERTER IMPRESSIONS  1. Left ventricular ejection fraction, by estimation, is 55 to 60%. The left ventricle has normal function. The left ventricle has no regional wall motion abnormalities. Left ventricular diastolic parameters were normal.  2. Calcified aortic valve. Given age and aortic dilation would be concerned for bicuspid valve. DVI 0.37. The aortic valve has an  indeterminant number of cusps. Aortic valve regurgitation is not visualized. Mild to moderate aortic valve stenosis. Aortic  valve area, by VTI measures 1.62 cm. Aortic valve mean gradient measures 14.0 mmHg. Aortic valve Vmax measures 2.48 m/s.  3. Right ventricular systolic function is mildly reduced. The right ventricular size is normal.  4. Left atrial size was mildly dilated.  5. The mitral valve is normal in structure. No evidence of mitral valve regurgitation. No evidence of mitral stenosis.  6. Aortic dilatation noted. Aneurysm of the ascending aorta, measuring 51 mm.  7. The inferior vena cava is normal in size with greater than 50% respiratory variability, suggesting right atrial pressure of 3 mmHg. FINDINGS  Left Ventricle: Left ventricular ejection fraction, by estimation, is 55 to 60%. The left ventricle has normal function. The left ventricle has no regional wall motion abnormalities. The left ventricular internal cavity size was normal in size. There is  no left ventricular hypertrophy. Left ventricular diastolic parameters were normal. Right Ventricle: The right ventricular size is normal. No increase in right ventricular wall thickness. Right ventricular systolic function is mildly reduced. Left Atrium: Left atrial size was mildly dilated. Right Atrium: Right atrial size was normal in size. Pericardium: There is no evidence of pericardial effusion. Mitral Valve: The mitral valve is normal in structure. No evidence of mitral valve regurgitation. No evidence of mitral valve stenosis. Tricuspid Valve: The tricuspid valve is normal in structure. Tricuspid valve regurgitation is not demonstrated. No evidence of tricuspid stenosis. Aortic Valve: Calcified aortic valve. Given age and aortic dilation would be concerned for bicuspid valve. DVI 0.37. The aortic valve has an indeterminant number of cusps. Aortic valve regurgitation is not visualized. Mild to moderate aortic stenosis is present. Aortic valve  mean gradient measures 14.0 mmHg. Aortic valve peak gradient measures 24.6 mmHg. Aortic valve area, by VTI measures 1.62 cm. Pulmonic Valve: The pulmonic valve was normal in structure. Pulmonic valve regurgitation is not visualized. No evidence of pulmonic stenosis. Aorta: Aortic dilatation noted. There is an aneurysm involving the ascending aorta measuring 51 mm. Venous: The inferior vena cava is normal in size with greater than 50% respiratory variability, suggesting right atrial pressure of 3 mmHg. IAS/Shunts: No atrial level shunt detected by color flow Doppler.  LEFT VENTRICLE PLAX 2D LVIDd:         5.30 cm   Diastology LVIDs:         3.90 cm   LV e' medial:    7.46 cm/s LV PW:         1.30 cm   LV E/e' medial:  9.3 LV IVS:        1.40 cm   LV e' lateral:   9.95 cm/s LVOT diam:     2.30 cm   LV E/e' lateral: 7.0 LV SV:  91 LV SV Index:   38 LVOT Area:     4.15 cm  RIGHT VENTRICLE             IVC RV Basal diam:  3.60 cm     IVC diam: 1.80 cm RV S prime:     10.90 cm/s TAPSE (M-mode): 1.8 cm LEFT ATRIUM             Index LA diam:        3.80 cm 1.60 cm/m LA Vol (A2C):   49.3 ml 20.72 ml/m LA Vol (A4C):   90.4 ml 37.99 ml/m LA Biplane Vol: 67.0 ml 28.15 ml/m  AORTIC VALVE AV Area (Vmax):    1.59 cm AV Area (Vmean):   1.53 cm AV Area (VTI):     1.62 cm AV Vmax:           248.00 cm/s AV Vmean:          170.500 cm/s AV VTI:            0.560 m AV Peak Grad:      24.6 mmHg AV Mean Grad:      14.0 mmHg LVOT Vmax:         94.75 cm/s LVOT Vmean:        62.800 cm/s LVOT VTI:          0.218 m LVOT/AV VTI ratio: 0.39  AORTA Ao Root diam: 3.90 cm Ao Asc diam:  5.10 cm MITRAL VALVE MV Area (PHT): 3.61 cm    SHUNTS MV Decel Time: 210 msec    Systemic VTI:  0.22 m MV E velocity: 69.40 cm/s  Systemic Diam: 2.30 cm MV A velocity: 68.80 cm/s MV E/A ratio:  1.01 Morene Brownie Electronically signed by Morene Brownie Signature Date/Time: 01/25/2024/6:40:07 PM    Final    CT ANGIO CHEST AORTA W/CM & OR  WO/CM Result Date: 01/25/2024 CLINICAL DATA:  Ascending aorta dilation. EXAM: CT ANGIOGRAPHY CHEST WITH CONTRAST TECHNIQUE: Multidetector CT imaging of the chest was performed using the standard protocol during bolus administration of intravenous contrast. Multiplanar CT image reconstructions and MIPs were obtained to evaluate the vascular anatomy. RADIATION DOSE REDUCTION: This exam was performed according to the departmental dose-optimization program which includes automated exposure control, adjustment of the mA and/or kV according to patient size and/or use of iterative reconstruction technique. CONTRAST:  75mL OMNIPAQUE  IOHEXOL  350 MG/ML SOLN COMPARISON:  CT scan chest from 06/03/2006. FINDINGS: Cardiovascular: Preferential opacification of the thoracic aorta. Normal heart size. No pericardial effusion. There are coronary artery calcifications, in keeping with coronary artery disease. There are also mild peripheral atherosclerotic vascular calcifications of thoracic aorta and its major branches. The measurements of thoracic aorta are as follows: *At the level of annulus 2.9 cm. *At the level of sinus of Valsalva-4.9 cm. *At the level of sinotubular junction-4.5 cm. *Ascending aorta at the level of main pulmonary trunk-4.9 cm. *Proximal descending thoracic aorta-3.3 cm. *Aorta at the level of diaphragmatic hiatus-3.1 cm. Mediastinum/Nodes: Visualized thyroid  gland appears grossly unremarkable. No solid / cystic mediastinal masses. The esophagus is nondistended precluding optimal assessment. No axillary, mediastinal or hilar lymphadenopathy by size criteria. Lungs/Pleura: The central tracheo-bronchial tree is patent. There are patchy areas of linear, plate-like atelectasis and/or scarring throughout bilateral lungs. No mass or consolidation. No pleural effusion or pneumothorax. No suspicious lung nodules. Upper Abdomen: There is diffuse thickening of bilateral adrenal glands, without discrete nodule. Findings  are nonspecific but mostly associated with adrenal hyperplasia. Remaining visualized  upper abdominal viscera within normal limits. Musculoskeletal: The visualized soft tissues of the chest wall are grossly unremarkable. No suspicious osseous lesions. There are mild multilevel degenerative changes in the visualized spine. IMPRESSION: 1. There is aneurysmal dilation of ascending thoracic aorta measuring up to 4.9 cm in diameter. No dissection or penetrating ulcer. 2. Multiple other nonacute observations, as described above. Aortic aneurysm NOS (ICD10-I71.9). Aortic Atherosclerosis (ICD10-I70.0). Electronically Signed   By: Ree Molt M.D.   On: 01/25/2024 16:03   DG Chest 2 View Result Date: 01/24/2024 CLINICAL DATA:  chest pain. EXAM: CHEST - 2 VIEW COMPARISON:  02/03/2022. FINDINGS: Low lung volume. Bilateral lung fields are clear. Bilateral costophrenic angles are clear. Stable cardio-mediastinal silhouette. No acute osseous abnormalities. The soft tissues are within normal limits. IMPRESSION: No active cardiopulmonary disease. Electronically Signed   By: Ree Molt M.D.   On: 01/24/2024 16:23    EKG: (personally reviewed) 01/24/24  SR with 1AVB, sinus arrhythmia 02/06/24 VP 80 bpm  02/07/24 CHB with ventricular escape beats (narrow QRS)  TELEMETRY: VP 80 bpm  (personally reviewed)  DEVICE HISTORY: n/a  Assessment/Plan:  CHB s/p CABG x3 with Bentall / AVR Surgery 02/06/24  -continue VVI 80 bpm at 10 mA -dependent at 30 bpm  -avoid AV nodal blockers -tele monitoring  -plan for dual chamber PPM on Friday with Dr. Kennyth  -NPO after MN on 8/15     For questions or updates, please contact [facility] Please consult [email or web address] for contact info under     Signed, Daphne Barrack, NP-C, AGACNP-BC [facility] - Electrophysiology  02/08/2024, 10:12 AM  EP Attending  Patient seen and examined. He is a pleasant man with a bicuspid aortic valve and AAA and 3V CAD who  underwent CABG and Bentall. He had evidence of conduction system disease preop and post op has had CHB with no escape. On exam he is well appearing and in NAD. Lungs with scattered basilar rales. CV with a RRR and split S2. Ext are warm. Incision is without bleeding and neuro is non-focal. Tele shows NSR with Ventricular pacing. CHB after Bentall - he is pacing 100% and he is dependent down to 30/min. He will undergo PPM insertion on Friday unless he becomes unstable in the interim due to any malfunction of his epicardial temporary pacing system which is currently working normally.  Danelle Luka Reisch,MD

## 2024-02-08 NOTE — Progress Notes (Signed)
 Plunger in Dilaudid  syringe accidentally removed and medication spilled. Witnessed by this Hotel manager. Another dose overrode in Pyxis and given to patient. See MAR.

## 2024-02-08 NOTE — Progress Notes (Signed)
   [address], [place] [address]             [phone]   POD # 2 Bio-Bentall  Sleeping  BP 105/85   Pulse 80   Temp 98.6 F (37 C) (Oral)   Resp 16   Ht (P) 6' 1 (1.854 m)   Wt 115.9 kg   SpO2 97%   BMI (P) 33.71 kg/m  Paced 8L HFNC 98% sat  Intake/Output Summary (Last 24 hours) at 02/08/2024 1714 Last data filed at 02/08/2024 1600 Gross per 24 hour  Intake 362.86 ml  Output 1350 ml  Net -987.14 ml  CBG in 200s Changed SSI to AC/ HS and resistant dosing  Pacemaker Friday if no recovery  Elspeth C. Kerrin, MD [facility] [phone]

## 2024-02-08 NOTE — Plan of Care (Signed)
  Problem: Education: Goal: Knowledge of General Education information will improve Description: Including pain rating scale, medication(s)/side effects and non-pharmacologic comfort measures Outcome: Progressing   Problem: Health Behavior/Discharge Planning: Goal: Ability to manage health-related needs will improve Outcome: Progressing   Problem: Clinical Measurements: Goal: Ability to maintain clinical measurements within normal limits will improve Outcome: Progressing Goal: Will remain free from infection Outcome: Progressing Goal: Diagnostic test results will improve Outcome: Progressing Goal: Respiratory complications will improve Outcome: Progressing Goal: Cardiovascular complication will be avoided Outcome: Progressing   Problem: Activity: Goal: Risk for activity intolerance will decrease Outcome: Progressing   Problem: Nutrition: Goal: Adequate nutrition will be maintained Outcome: Progressing   Problem: Coping: Goal: Level of anxiety will decrease Outcome: Progressing   Problem: Elimination: Goal: Will not experience complications related to bowel motility Outcome: Progressing Goal: Will not experience complications related to urinary retention Outcome: Progressing   Problem: Pain Managment: Goal: General experience of comfort will improve and/or be controlled Outcome: Progressing   Problem: Safety: Goal: Ability to remain free from injury will improve Outcome: Progressing   Problem: Skin Integrity: Goal: Risk for impaired skin integrity will decrease Outcome: Progressing   Problem: Education: Goal: Ability to describe self-care measures that may prevent or decrease complications (Diabetes Survival Skills Education) will improve Outcome: Progressing Goal: Individualized Educational Video(s) Outcome: Progressing   Problem: Coping: Goal: Ability to adjust to condition or change in health will improve Outcome: Progressing   Problem: Fluid  Volume: Goal: Ability to maintain a balanced intake and output will improve Outcome: Progressing   Problem: Health Behavior/Discharge Planning: Goal: Ability to identify and utilize available resources and services will improve Outcome: Progressing Goal: Ability to manage health-related needs will improve Outcome: Progressing

## 2024-02-09 DIAGNOSIS — I442 Atrioventricular block, complete: Secondary | ICD-10-CM | POA: Diagnosis not present

## 2024-02-09 DIAGNOSIS — E119 Type 2 diabetes mellitus without complications: Secondary | ICD-10-CM | POA: Diagnosis not present

## 2024-02-09 LAB — TYPE AND SCREEN
ABO/RH(D): A POS
Antibody Screen: NEGATIVE
Unit division: 0
Unit division: 0

## 2024-02-09 LAB — CBC
HCT: 26.8 % — ABNORMAL LOW (ref 39.0–52.0)
Hemoglobin: 8.5 g/dL — ABNORMAL LOW (ref 13.0–17.0)
MCH: 29.7 pg (ref 26.0–34.0)
MCHC: 31.7 g/dL (ref 30.0–36.0)
MCV: 93.7 fL (ref 80.0–100.0)
Platelets: 127 K/uL — ABNORMAL LOW (ref 150–400)
RBC: 2.86 MIL/uL — ABNORMAL LOW (ref 4.22–5.81)
RDW: 16.1 % — ABNORMAL HIGH (ref 11.5–15.5)
WBC: 10 K/uL (ref 4.0–10.5)
nRBC: 0 % (ref 0.0–0.2)

## 2024-02-09 LAB — GLUCOSE, CAPILLARY
Glucose-Capillary: 154 mg/dL — ABNORMAL HIGH (ref 70–99)
Glucose-Capillary: 171 mg/dL — ABNORMAL HIGH (ref 70–99)
Glucose-Capillary: 183 mg/dL — ABNORMAL HIGH (ref 70–99)
Glucose-Capillary: 133 mg/dL — ABNORMAL HIGH (ref 70–99)

## 2024-02-09 LAB — BASIC METABOLIC PANEL WITH GFR
Anion gap: 7 (ref 5–15)
BUN: 21 mg/dL — ABNORMAL HIGH (ref 6–20)
CO2: 27 mmol/L (ref 22–32)
Calcium: 8.4 mg/dL — ABNORMAL LOW (ref 8.9–10.3)
Chloride: 102 mmol/L (ref 98–111)
Creatinine, Ser: 0.95 mg/dL (ref 0.61–1.24)
GFR, Estimated: >60 mL/min (ref >60)
Glucose, Bld: 229 mg/dL — ABNORMAL HIGH (ref 70–99)
Potassium: 4.4 mmol/L (ref 3.5–5.1)
Sodium: 136 mmol/L (ref 135–145)

## 2024-02-09 LAB — BPAM RBC
Blood Product Expiration Date: 202508302359
Blood Product Expiration Date: 202508312359
ISSUE DATE / TIME: 202508111556
ISSUE DATE / TIME: 202508111556
Unit Type and Rh: 6200
Unit Type and Rh: 6200

## 2024-02-09 LAB — SURGICAL PCR SCREEN
MRSA, PCR: NEGATIVE
Staphylococcus aureus: NEGATIVE

## 2024-02-09 MED ORDER — INSULIN ASPART 100 UNIT/ML IJ SOLN: 6.0000 [IU] | Freq: Three times a day (TID) | INTRAMUSCULAR | Status: DC

## 2024-02-09 MED ORDER — POLYETHYLENE GLYCOL 3350 17 G PO PACK
17.0000 g | PACK | Freq: Two times a day (BID) | ORAL | Status: DC
  Administered 2024-02-09 – 2024-02-12 (×6): 17 g via ORAL
  Filled 2024-02-09 (×6): qty 1

## 2024-02-09 MED ORDER — INSULIN ASPART 100 UNIT/ML IJ SOLN
4.0000 [IU] | Freq: Three times a day (TID) | INTRAMUSCULAR | Status: DC
  Administered 2024-02-09 – 2024-02-11 (×4): 4 [IU] via SUBCUTANEOUS

## 2024-02-09 MED ORDER — FUROSEMIDE 10 MG/ML IJ SOLN
40.0000 mg | Freq: Once | INTRAMUSCULAR | Status: AC
  Administered 2024-02-09: 40 mg via INTRAVENOUS
  Filled 2024-02-09: qty 4

## 2024-02-09 MED ORDER — INSULIN ASPART 100 UNIT/ML IJ SOLN: 4.0000 [IU] | Freq: Three times a day (TID) | INTRAMUSCULAR | Status: DC

## 2024-02-09 MED ORDER — GUAIFENESIN ER 600 MG PO TB12
600.0000 mg | ORAL_TABLET | Freq: Two times a day (BID) | ORAL | Status: DC
  Administered 2024-02-09 – 2024-02-12 (×6): 600 mg via ORAL
  Filled 2024-02-09 (×6): qty 1

## 2024-02-09 NOTE — Progress Notes (Signed)
 NAME:  Colin Ramirez, MRN:  [id number], DOB:  1966-02-06, LOS: 13 ADMISSION DATE:  01/24/2024, CONSULTATION DATE:  02/06/24 REFERRING MD:  Dr. Shyrl, CHIEF COMPLAINT:  post op   History of Present Illness:  Colin Ramirez is a 58 y.o. male who has a PMH as below including but not limited to CAD (LHC in May 2019 with 2v disease with moderate stenosis), HTN, DM2. He was admitted to [facility] on 01/24/24 with chest pain.   An echocardiogram was performed on 7/30 that demonstrated an EF of 55 to 60%, calcified aortic valve, mildly reduced RV systolic function, mildly dilated LA, aortic dilatation with an aneurysm of the ascending aorta. On 7/31 he had L/RHC that revealed severe multivessel disease. He was evaluated by thoracic surgery for consideration of a CABG/AVR/Bentall.   On 02/06/2024, he was taken to the operating room where he had CABG x 3 (LIMA LAD, RSVG PDA DIAG), Bentall, ascending aortic arch replacement.  Did have some reperfusion arrest and longer pump run leading to oozing post.   Operatively, PCCM was asked to see in consultation for medical management assistance.  Pertinent  Medical History  Primary hypertension; HLD (hyperlipidemia); GERD (gastroesophageal reflux disease); Sleep apnea; Class 1 obesity due to excess calories with serious comorbidity and body mass index (BMI) of 33.0 to 33.9 in adult; Degenerative disc disease; Tobacco abuse; Hypothyroidism; Unilateral primary osteoarthritis, left hip; Status post left hip replacement; Atherosclerosis of native coronary artery with angina pectoris (HCC); Bilateral hearing loss; Facial twitching; Sigmoid diverticulitis; DM2 (diabetes mellitus, type 2) (HCC); Hx of adenomatous colonic polyps; Dermatitis; Localized edema; Microalbuminuria; Atypical chest pain; Hypokalemia; Unstable angina (HCC); Aneurysm of ascending aorta without rupture (HCC); Aortic stenosis with bicuspid valve; and Acute heart failure with preserved ejection fraction  (HCC) on their problem list.  Significant Hospital Events: Including procedures, antibiotic start and stop dates in addition to other pertinent events   7/29 admit 7/31 L/RHC 8/6 to OR for extraction of tooth # 18 8/11 to OR for bentall, CABG x 3, AVR 8/13 extubated, remains on Levophed  and V paced  Interim History / Subjective:  Pain better controlled today Making urine, CT output 410cc last 24hrs Passing flatus, but no BM Fairly good appetite Remains on levophed  and midodrine  10mg   Pacer dependent, plan for PPM tomorrow  Objective    Blood pressure 113/77, pulse 80, temperature 97.9 F (36.6 C), temperature source Oral, resp. rate 19, height (P) 6' 1 (1.854 m), weight 115.9 kg, SpO2 96%.    PEEP:  [5 cmH20] 5 cmH20   Intake/Output Summary (Last 24 hours) at 02/09/2024 0735 Last data filed at 02/09/2024 0700 Gross per 24 hour  Intake 494.67 ml  Output 2190 ml  Net -1695.33 ml   Filed Weights   02/07/24 0500 02/08/24 0500 02/09/24 0633  Weight: 116 kg 115.9 kg 115.9 kg      Examination: General:  ill-appearing M resting in bed in NAD HEENT: MM pink/moist, PERRLA Neuro: Aox3, MAE CV: vpaced, sternal dressing cdi, Ctx3, pacing wires, R internal jugular PA, left radial aline, underlying HB with rates in 30's, mediastinal CT in place  PULM:  clear bilaterally on [place] without rhonchi or wheezing  GI: obese, mild distention, non-tender to palpation Extremities: warm/dry, no edema   Labs reviewed:  Hgb drifting from 9.3 to 8.5 Glucose 229 Creatinine 0.95  Resolved problem list   Postoperative ventilator management, resolved AKI  Assessment and Plan   Multivessel CAD with ascending thoracic aortic  aneurysm and bicuspid aortic valve stenosis - s/p CABG Bentall and Ascending arch replacement for aneurysm Hx essential HTN, HLD Complete HB Expected post op ABLA Residual Post bypass vasoplegia Tobacco dependence - known history of 1.5ppd x 30  years. Remains V-paced and on low dose norepi and midodrine  - post-op management per TCTS -plan to leave CT today, pacing dependent, plan for PPM placement tomorrow-NPO at midnight -continue midodrine  and low dose nor-epi to maintain MAP >65, expect that this will improve after PPM - tele monitoring - mediastinal drains per TCTS, monitor h/h - multimodal pain control per protocol> cont lidocaine  patches, gabapentin , tramadol , and resume home prn TID dilaudid  PO w/ bowel regimen, feels better with robaxin   - ASA, statin - metoprolol  BID and diuresis once off pressors - monitor electrolytes, replete PRN - GDMT per primary -meal coverage novolog  added  - PCCM will continue to follow while in ICU - smoking cessation    Hx DM2 - transition of insulin  gtt today -glucose above goal 140-180 today -meal coverage novolog  added, continue resistant scale SSI and semglee  30 units bid    Hx GERD - PPI   Hx Hypothyroidism - Continue PTA Synthroid    Hx bilateral hip pain/sciatica, peripheral neuropathy, chronic pain - Continue Gabapentin , resume prn home chronic prn dilaudid , states takes mostly TID regularly w/ bowel regimen, denies chronic constipation   Unilateral pinkeye Hordeolum. - Continue warm compresses and Erythromycin  ointment  OSA - ABG reassuring - cont nightly CPAP    Best Practice (right click and Reselect all SmartList Selections daily)   Diet/type: Regular consistency (see orders) DVT prophylaxis SCD Pressure ulcer(s): N/A GI prophylaxis: PPI Lines: Central line and Arterial Line Foley:  Yes, and it is no longer needed and removal ordered  Code Status:  full code Last date of multidisciplinary goals of care discussion [per primary]  Labs   CBC: Recent Labs  Lab 02/05/24 0253 02/06/24 0946 02/06/24 1508 02/06/24 1521 02/06/24 1802 02/06/24 1831 02/06/24 2302 02/07/24 0438 02/07/24 0448 02/07/24 1727 02/08/24 0347  WBC 6.2  --   --   --  13.7*  --    --  10.5  --  16.6* 12.5*  HGB 13.1   < > 10.9*   < > 11.3*   < > 10.5* 10.2* 10.2* 10.7* 9.3*  HCT 40.8   < > 34.4*   < > 35.2*   < > 31.0* 31.7* 30.0* 34.0* 29.0*  MCV 90.7  --   --   --  93.4  --   --  92.2  --  94.2 92.4  PLT 214  --  157  --  162  --   --  162  --  169 162   < > = values in this interval not displayed.    Basic Metabolic Panel: Recent Labs  Lab 02/04/24 0812 02/05/24 0253 02/05/24 2314 02/06/24 0946 02/06/24 1705 02/06/24 1831 02/07/24 0438 02/07/24 0448 02/07/24 1638 02/07/24 1836 02/08/24 0347  NA  --  139 139   < > 143   < > 139 142 141 139 137  K  --  3.7 4.0   < > 3.9   < > 4.0 4.1 3.1* 4.1 4.3  CL  --  103 103   < > 107  --  110  --  118* 108 107  CO2  --  26 26  --   --   --  22  --  16* 20* 23  GLUCOSE  --  166* 273*   < > 177*  --  141*  --  104* 144* 120*  BUN  --  16 17   < > 25*  --  23*  --  21* 27* 29*  CREATININE  --  0.83 1.04   < > 0.90  --  0.95  --  0.84 1.30* 1.01  CALCIUM   --  9.2 9.3  --   --   --  8.2*  --  6.4* 8.6* 8.4*  MG 2.0 2.2  --   --   --   --  2.5*  --  1.9  --  2.5*   < > = values in this interval not displayed.   GFR: Estimated Creatinine Clearance: 103.7 mL/min (by C-G formula based on SCr of 1.01 mg/dL). Recent Labs  Lab 02/06/24 1802 02/07/24 0438 02/07/24 1727 02/08/24 0347  WBC 13.7* 10.5 16.6* 12.5*    Liver Function Tests: Recent Labs  Lab 02/05/24 2314  AST 26  ALT 44  ALKPHOS 44  BILITOT 0.3  PROT 6.5  ALBUMIN  3.2*   No results for input(s): LIPASE, AMYLASE in the last 168 hours. No results for input(s): AMMONIA in the last 168 hours.  ABG    Component Value Date/Time   PHART 7.359 02/07/2024 0448   PCO2ART 38.9 02/07/2024 0448   PO2ART 78 (L) 02/07/2024 0448   HCO3 21.8 02/07/2024 0448   TCO2 23 02/07/2024 0448   ACIDBASEDEF 3.0 (H) 02/07/2024 0448   O2SAT 94 02/07/2024 0448     Coagulation Profile: Recent Labs  Lab 02/05/24 2314 02/06/24 1802  INR 0.9 1.5*    Cardiac  Enzymes: No results for input(s): CKTOTAL, CKMB, CKMBINDEX, TROPONINI in the last 168 hours.  HbA1C: Hgb A1c MFr Bld  Date/Time Value Ref Range Status  02/05/2024 11:14 PM 7.6 (H) 4.8 - 5.6 % Final    Comment:    (NOTE)         Prediabetes: 5.7 - 6.4         Diabetes: >6.4         Glycemic control for adults with diabetes: <7.0   01/25/2024 05:11 AM 7.4 (H) 4.8 - 5.6 % Final    Comment:    (NOTE) Diagnosis of Diabetes The following HbA1c ranges recommended by the American Diabetes Association (ADA) may be used as an aid in the diagnosis of diabetes mellitus.  Hemoglobin             Suggested A1C NGSP%              Diagnosis  <5.7                   Non Diabetic  5.7-6.4                Pre-Diabetic  >6.4                   Diabetic  <7.0                   Glycemic control for                       adults with diabetes.      CBG: Recent Labs  Lab 02/08/24 0754 02/08/24 1112 02/08/24 1530 02/08/24 2149 02/09/24 0634  GLUCAP 166* 205* 234* 225* 154*   Allergies Allergies  Allergen Reactions   Omnicef  [Cefdinir ] Rash     Home Medications  Prior to Admission  medications   Medication Sig Start Date End Date Taking? Authorizing Provider  amLODipine  (NORVASC ) 10 MG tablet Take 1 tablet (10 mg total) by mouth daily. 08/25/21 01/25/24 Yes Oley Bascom RAMAN, NP  aspirin  81 MG EC tablet TAKE 1 TABLET DAILY. 08/25/21  Yes Oley Bascom RAMAN, NP  atorvastatin  (LIPITOR) 80 MG tablet Take 1 tablet (80 mg total) by mouth daily. Patient taking differently: Take 80 mg by mouth at bedtime. 08/25/21  Yes Oley Bascom RAMAN, NP  carvedilol  (COREG ) 6.25 MG tablet Take 1 tablet (6.25 mg total) by mouth 2 (two) times daily. 08/25/21  Yes Oley Bascom RAMAN, NP  dapagliflozin  propanediol (FARXIGA ) 10 MG TABS tablet Take 1 tablet (10 mg total) by mouth daily. 08/25/21  Yes Oley Bascom RAMAN, NP  esomeprazole  (NEXIUM ) 40 MG capsule Take 1 capsule (40 mg total) by mouth daily. 08/25/21  Yes  Oley Bascom RAMAN, NP  hydrochlorothiazide  (HYDRODIURIL ) 25 MG tablet TAKE 1 TABLET BY MOUTH EVERY DAY 05/04/21  Yes Tanda Bleacher, MD  HYDROmorphone  (DILAUDID ) 4 MG tablet Take 4 mg by mouth every 4 (four) hours as needed for severe pain (pain score 7-10).   Yes [provider]  levothyroxine  (SYNTHROID ) 150 MCG tablet Take 1 tablet (150 mcg total) by mouth daily. 08/25/21  Yes Oley Bascom RAMAN, NP  metFORMIN  (GLUCOPHAGE -XR) 500 MG 24 hr tablet Take 2 tablets (1,000 mg total) by mouth 2 (two) times daily with a meal. 08/25/21  Yes Oley Bascom RAMAN, NP  nitroGLYCERIN  (NITROSTAT ) 0.4 MG SL tablet Place 1 tablet (0.4 mg total) under the tongue every 5 (five) minutes as needed for chest pain. 05/30/19  Yes Theotis Haze ORN, NP  ramipril  (ALTACE ) 10 MG capsule Take 1 capsule (10 mg total) by mouth daily. 08/25/21  Yes Oley Bascom RAMAN, NP  Misc. Devices MISC Dispense CPAP mask, tank, and tubing for patient use nightly. 08/30/19   [name], MD  triamcinolone  cream (KENALOG ) 0.1 % Apply 1 application topically 2 (two) times daily. 02/10/21   Mayers, Kirk RAMAN, PA-C     Critical care time: 30 mins     CRITICAL CARE Performed by: Leita SAUNDERS Kissie Ziolkowski   Total critical care time: 30 minutes  Critical care time was exclusive of separately billable procedures and treating other patients.  Critical care was necessary to treat or prevent imminent or life-threatening deterioration.  Critical care was time spent personally by me on the following activities: development of treatment plan with patient and/or surrogate as well as nursing, discussions with consultants, evaluation of patient's response to treatment, examination of patient, obtaining history from patient or surrogate, ordering and performing treatments and interventions, ordering and review of laboratory studies, ordering and review of radiographic studies, pulse oximetry and re-evaluation of patient's condition.   Leita SAUNDERS Randen Kauth,  PA-C [facility] See Amion for pager If no response to pager , please call [phone] until 7pm After 7:00 pm call Elink  [phone]

## 2024-02-09 NOTE — Plan of Care (Signed)

## 2024-02-09 NOTE — Progress Notes (Addendum)
 [address] [employer] [address]             [phone]      3 Days Post-Op Procedure(s) (LRB): CORONARY ARTERY BYPASS GRAFTING (CABG) x THREE USING LEFT INTERNAL MAMMARY ARTERY AND ENDOSCOPICALLY HARVESTED RIGHT GREATER SAPHENOUS VEIN (N/A) BENTALL PROCEDURE AND HEMIARCH USING HEMASHIELD PLATINUM VASCULAR GRAFT AND KONECT RESILIA AORTIC VALVE CONDUIT, RIGHT AXILLARY CANNULATION USING HEMASHIELD PLATINUM VASCULAR GRAFT (N/A) ECHOCARDIOGRAM, TRANSESOPHAGEAL, INTRAOPERATIVE (N/A) Subjective: Patient reports he feels ok but would really like to get the tubes out. Reports his walking went well this AM, walked 328ft.  Objective: Vital signs in last 24 hours: Temp:  [97.9 F (36.6 C)-99.2 F (37.3 C)] 97.9 F (36.6 C) (08/14 0700) Pulse Rate:  [63-81] 80 (08/14 0700) Cardiac Rhythm: Ventricular paced (08/14 0400) Resp:  [12-32] 19 (08/14 0700) BP: (76-134)/(55-86) 113/77 (08/14 0700) SpO2:  [92 %-98 %] 96 % (08/14 0700) Arterial Line BP: (50-107)/(45-96) 87/82 (08/13 1230) Weight:  [115.9 kg] 115.9 kg (08/14 9366)  Hemodynamic parameters for last 24 hours:    Intake/Output from previous day: 08/13 0701 - 08/14 0700 In: 494.7 [P.O.:120; I.V.:374.7] Out: 2190 [Urine:1780; Chest Tube:410] Intake/Output this shift: No intake/output data recorded.  General appearance: alert, cooperative, and no distress Neurologic: intact Heart: V paced at 80, no murmur Lungs: slightly diminished bibasilar breath sounds Abdomen: soft, non-tender; bowel sounds normal; no masses,  no organomegaly Extremities: edema trace BLE Wound: Clean and dry dressing in place  Lab Results: Recent Labs    02/07/24 1727 02/08/24 0347  WBC 16.6* 12.5*  HGB 10.7* 9.3*  HCT 34.0* 29.0*  PLT 169 162   BMET:  Recent Labs    02/07/24 1836 02/08/24 0347  NA 139 137  K 4.1 4.3  CL 108 107  CO2 20* 23  GLUCOSE 144* 120*  BUN 27* 29*  CREATININE 1.30* 1.01   CALCIUM  8.6* 8.4*    PT/INR:  Recent Labs    02/06/24 1802  LABPROT 18.5*  INR 1.5*   ABG    Component Value Date/Time   PHART 7.359 02/07/2024 0448   HCO3 21.8 02/07/2024 0448   TCO2 23 02/07/2024 0448   ACIDBASEDEF 3.0 (H) 02/07/2024 0448   O2SAT 94 02/07/2024 0448   CBG (last 3)  Recent Labs    02/08/24 1530 02/08/24 2149 02/09/24 0634  GLUCAP 234* 225* 154*    Assessment/Plan: S/P Procedure(s) (LRB): CORONARY ARTERY BYPASS GRAFTING (CABG) x THREE USING LEFT INTERNAL MAMMARY ARTERY AND ENDOSCOPICALLY HARVESTED RIGHT GREATER SAPHENOUS VEIN (N/A) BENTALL PROCEDURE AND HEMIARCH USING HEMASHIELD PLATINUM VASCULAR GRAFT AND KONECT RESILIA AORTIC VALVE CONDUIT, RIGHT AXILLARY CANNULATION USING HEMASHIELD PLATINUM VASCULAR GRAFT (N/A) ECHOCARDIOGRAM, TRANSESOPHAGEAL, INTRAOPERATIVE (N/A)  Neuro: Intact, left sided facial twitch patient reports this is his baseline. Chronic pain, on Dilaudid  4mg  Q4H PRN at home. Pain this AM controlled, continue current pain regimen.   CV: VVI paced at 80. Likely still CHB under pacer, EP has been consulted with plan for PPM tomorrow. Leave EPW in place. Hold BB. On Miodrine 10mg  TID for hypotension to assist with weaning of levophed . On levo 2mcg, BP this AM 88/60 but was 113/77 prior to that. Hopefully can wean off levo today. Per Dr. Lang note yesterday goal SBP>100.    Pulm: Saturating well on 6L [place] and CPAP at night. CXR with left basilar atelectasis vs small left pleural effusion. Chest tube output 410cc/24hrs.  No air leak. Leave chest tubes in place for moderate drainage. Encourage IS and ambulation. Likely going to have difficulty weaning off oxygen until we can diurese and once tubes come out since the patient has some splinting with deep breaths and cough. Does admit some difficulty coughing up sputum, will add mucinex . Wean O2 as tolerated for spO2 greater than 90%   GI: Tolerating a diet but minimal appetite.  Denies nausea/vomiting. Passing gas. -BM   Renal: Cr 1.01 yesterday, no new labs this AM. UO 1780cc/24hrs. About +9-11lbs from preop weight. Hold Lasix  due to soft BP.    Endo: Hypothyroid, on Synthroid . T2DM, preop A1C 7.6. On Farxiga  and metformin  at home. Elevated CBGs on Semglee  30U BID and SSI. Will add mealtime Novolog  if eating greater than 50% of his meals.    ID: Likely reactive leukocytosis improving. Tmax 99.2   Expected postop ABLA: H/H 9.3/29. Not clinically significant at this time. No updated labs this AM.   DVT Prophylaxis: Lovenox    Dispo: Continue ICU care, plan for PPM tomorrow   LOS: 13 days    Con GORMAN Bend, PA-C 02/09/2024  Agree PPM tomorrow Remains on levo  Karynn Deblasi O Leathie Weich

## 2024-02-09 NOTE — Plan of Care (Signed)

## 2024-02-09 NOTE — Progress Notes (Addendum)
   02/09/24 2000  BiPAP/CPAP/SIPAP  Reason BIPAP/CPAP not in use Non-compliant   Pt has home CPAP but doesn't want to wear tonight. His home mask is very old and will barely stay tightened. I switched to a nasal mask from our stock and he was able to tolerate for a couple hours last night. I explained that he should call and get a replacement. He agreed. Pt is currently wearing 6L [place] and tolerating well.

## 2024-02-09 NOTE — Progress Notes (Addendum)
 Patient Name: Colin Ramirez Date of Encounter: 02/09/2024  Primary Cardiologist: MARALEE ELSPETH BROCKS, MD Electrophysiologist: None  Interval Summary   The patient is doing well today.  Remains dependent on pacing.  On levophed  2mcg.    At this time, the patient denies chest pain, shortness of breath, or any new concerns.  Vital Signs    Vitals:   02/09/24 0600 02/09/24 0615 02/09/24 0633 02/09/24 0700  BP: 92/67 101/70  113/77  Pulse: 80 80  80  Resp: 16 18  19   Temp:    97.9 F (36.6 C)  TempSrc:    Oral  SpO2: 97% 97%  96%  Weight:   115.9 kg   Height:        Intake/Output Summary (Last 24 hours) at 02/09/2024 0909 Last data filed at 02/09/2024 0700 Gross per 24 hour  Intake 473.59 ml  Output 2105 ml  Net -1631.41 ml   Filed Weights   02/07/24 0500 02/08/24 0500 02/09/24 9366  Weight: 116 kg 115.9 kg 115.9 kg    Physical Exam    GEN- NAD, Alert and oriented  Lungs- Clear to ausculation bilaterally, normal work of breathing Cardiac- Regular rate and rhythm (VP), no murmurs, rubs or gallops GI- soft, NT, ND, + BS Extremities- no clubbing or cyanosis. No edema  Telemetry    VP 80 bpm (personally reviewed)  Hospital Course    Colin Ramirez is a 58 y.o. male with a history of 1AVB, HTN, HLD, CAD, DM & tobacco abuse admitted 01/24/24 with chest pain. LHC showed complex stenosis in the proximal to mid LAD spanning 2 large diagonal branches. Also disease in small OM and distal RCA.  He was also identified to have a bicuspid AV with mild-mod stenosis & ascending aortic aneurysm (4.9 cm).  The patient was evaluated by TCTS and recommended for planned CABG / AVR / Bentall procedure.    On 02/06/24, he underwent CABG x3 & Bentall Procedure.  He was extubated the evening of surgery to BiPAP.  ICU course notable for CHB and ongoing need for pacing.  He required levophed  for hemodynamic support.   Assessment & Plan    CHB s/p CABG x3 with Bentall / AVR Surgery date  02/06/24  -continue VVI 80 bpm at 10 mA > LOC at 5 mA, dependent at 30 bpm  -avoid AV nodal blockers  -tele monitoring  -NPO after MN on 8/15 for dual chamber PPM     For questions or updates, please contact [facility] Please consult [email or web address] for contact info under     Signed, Daphne Barrack, NP-C, AGACNP-BC [facility] - Electrophysiology  02/09/2024, 9:12 AM  I have seen, examined the patient, and reviewed the above assessment and plan.    Interval:  No acute overnight events. Patient reports feeling relatively well. No new or acute complaints.   V pacing. Underlying sinus with CHB. No intrinsic at VVI 30bpm. Threshold 5.   General: Well developed, in no acute distress.  Neck: No JVD.  Cardiac: Normal rate, regular rhythm.  Resp: Normal work of breathing.  Ext: No edema.  Neuro: No gross focal deficits.  Psych: Normal affect.   Assessment: Colin Ramirez is a 58 y.o. male with a history of 1AVB, HTN, HLD, CAD, DM & tobacco abuse admitted 01/24/24 with chest pain. Now s/p CABG / AVR / Bentall procedure on 02/06/24.     Plan:  #Complete heart block - Continue to monitor for recovery. POD#3 today. If  no recovery tomorrow then permanent pacer implant.   [name], MD 02/09/2024 11:39 PM

## 2024-02-10 ENCOUNTER — Encounter (HOSPITAL_COMMUNITY)
Admission: EM | Disposition: A | Discharge status: Home / Self Care | Payer: Self-pay | Source: Home / Self Care | Attending: Internal Medicine

## 2024-02-10 DIAGNOSIS — R0789 Other chest pain: Secondary | ICD-10-CM | POA: Diagnosis not present

## 2024-02-10 HISTORY — PX: PACEMAKER IMPLANT: EP1218

## 2024-02-10 LAB — GLUCOSE, CAPILLARY
Glucose-Capillary: 137 mg/dL — ABNORMAL HIGH (ref 70–99)
Glucose-Capillary: 119 mg/dL — ABNORMAL HIGH (ref 70–99)
Glucose-Capillary: 103 mg/dL — ABNORMAL HIGH (ref 70–99)
Glucose-Capillary: 127 mg/dL — ABNORMAL HIGH (ref 70–99)

## 2024-02-10 LAB — CBC
HCT: 25.8 % — ABNORMAL LOW (ref 39.0–52.0)
Hemoglobin: 8.1 g/dL — ABNORMAL LOW (ref 13.0–17.0)
MCH: 29.5 pg (ref 26.0–34.0)
MCHC: 31.4 g/dL (ref 30.0–36.0)
MCV: 93.8 fL (ref 80.0–100.0)
Platelets: 152 K/uL (ref 150–400)
RBC: 2.75 MIL/uL — ABNORMAL LOW (ref 4.22–5.81)
RDW: 16.2 % — ABNORMAL HIGH (ref 11.5–15.5)
WBC: 7.6 K/uL (ref 4.0–10.5)
nRBC: 0 % (ref 0.0–0.2)

## 2024-02-10 LAB — BASIC METABOLIC PANEL WITH GFR
Anion gap: 3 — ABNORMAL LOW (ref 5–15)
BUN: 17 mg/dL (ref 6–20)
CO2: 28 mmol/L (ref 22–32)
Calcium: 7.6 mg/dL — ABNORMAL LOW (ref 8.9–10.3)
Chloride: 103 mmol/L (ref 98–111)
Creatinine, Ser: 0.74 mg/dL (ref 0.61–1.24)
GFR, Estimated: >60 mL/min (ref >60)
Glucose, Bld: 212 mg/dL — ABNORMAL HIGH (ref 70–99)
Potassium: 3.8 mmol/L (ref 3.5–5.1)
Sodium: 134 mmol/L — ABNORMAL LOW (ref 135–145)

## 2024-02-10 LAB — MAGNESIUM: Magnesium: 1.9 mg/dL (ref 1.7–2.4)

## 2024-02-10 SURGERY — PACEMAKER IMPLANT

## 2024-02-10 MED ORDER — ACETAMINOPHEN 325 MG PO TABS: 325.0000 mg | ORAL_TABLET | ORAL | Status: DC | PRN

## 2024-02-10 MED ORDER — SODIUM CHLORIDE 0.9% FLUSH: 3.0000 mL | INTRAVENOUS | Status: DC | PRN

## 2024-02-10 MED ORDER — LIDOCAINE HCL (PF) 1 % IJ SOLN
INTRAMUSCULAR | Status: DC | PRN
  Administered 2024-02-10: 45 mL

## 2024-02-10 MED ORDER — CHLORHEXIDINE GLUCONATE 4 % EX SOLN
60.0000 mL | Freq: Once | CUTANEOUS | Status: AC
  Administered 2024-02-10: 4 via TOPICAL
  Filled 2024-02-10: qty 45

## 2024-02-10 MED ORDER — SORBITOL 70 % SOLN
30.0000 mL | Freq: Once | Status: AC
  Administered 2024-02-10: 30 mL via ORAL
  Filled 2024-02-10: qty 30

## 2024-02-10 MED ORDER — CHLORHEXIDINE GLUCONATE 4 % EX SOLN
60.0000 mL | Freq: Once | CUTANEOUS | Status: DC
  Filled 2024-02-10: qty 15

## 2024-02-10 MED ORDER — ALUM & MAG HYDROXIDE-SIMETH 200-200-20 MG/5ML PO SUSP
15.0000 mL | Freq: Once | ORAL | Status: AC
  Administered 2024-02-10: 15 mL via ORAL
  Filled 2024-02-10: qty 30

## 2024-02-10 MED ORDER — SODIUM CHLORIDE 0.9 % IV SOLN: INTRAVENOUS | Status: DC

## 2024-02-10 MED ORDER — POTASSIUM CHLORIDE CRYS ER 20 MEQ PO TBCR
20.0000 meq | EXTENDED_RELEASE_TABLET | Freq: Once | ORAL | Status: AC
  Administered 2024-02-10: 20 meq via ORAL
  Filled 2024-02-10: qty 1

## 2024-02-10 MED ORDER — MAGNESIUM SULFATE 2 GM/50ML IV SOLN
2.0000 g | Freq: Once | INTRAVENOUS | Status: AC
  Administered 2024-02-10: 2 g via INTRAVENOUS
  Filled 2024-02-10: qty 50

## 2024-02-10 MED ORDER — SODIUM CHLORIDE 0.9 % IV SOLN
INTRAVENOUS | Status: AC
  Filled 2024-02-10: qty 2

## 2024-02-10 MED ORDER — SODIUM CHLORIDE 0.9 % IV SOLN
80.0000 mg | INTRAVENOUS | Status: AC
  Administered 2024-02-10: 80 mg

## 2024-02-10 MED ORDER — SODIUM CHLORIDE 0.9% FLUSH
3.0000 mL | Freq: Two times a day (BID) | INTRAVENOUS | Status: DC
  Administered 2024-02-10 – 2024-02-11 (×3): 3 mL via INTRAVENOUS

## 2024-02-10 MED ORDER — SODIUM CHLORIDE 0.9 % IV SOLN: 250.0000 mL | INTRAVENOUS | Status: DC

## 2024-02-10 MED ORDER — LIDOCAINE HCL 1 % IJ SOLN
INTRAMUSCULAR | Status: AC
  Filled 2024-02-10: qty 60

## 2024-02-10 MED ORDER — VANCOMYCIN HCL 1500 MG/300ML IV SOLN
1500.0000 mg | INTRAVENOUS | Status: AC
  Administered 2024-02-10: 1500 mg via INTRAVENOUS
  Filled 2024-02-10: qty 300

## 2024-02-10 MED ORDER — HEPARIN (PORCINE) IN NACL 1000-0.9 UT/500ML-% IV SOLN
INTRAVENOUS | Status: DC | PRN
  Administered 2024-02-10: 500 mL

## 2024-02-10 MED FILL — Mannitol IV Soln 20%: INTRAVENOUS | Qty: 500 | Status: AC

## 2024-02-10 MED FILL — Heparin Sodium (Porcine) Inj 1000 Unit/ML: INTRAMUSCULAR | Qty: 20 | Status: AC

## 2024-02-10 MED FILL — Sodium Bicarbonate IV Soln 8.4%: INTRAVENOUS | Qty: 50 | Status: AC

## 2024-02-10 MED FILL — Electrolyte-R (PH 7.4) Solution: INTRAVENOUS | Qty: 6000 | Status: AC

## 2024-02-10 MED FILL — Sodium Chloride IV Soln 0.9%: INTRAVENOUS | Qty: 750 | Status: AC

## 2024-02-10 MED FILL — Calcium Chloride Inj 10%: INTRAVENOUS | Qty: 10 | Status: AC

## 2024-02-10 SURGICAL SUPPLY — 13 items
CABLE SURGICAL S-101-97-12 (CABLE) ×1 IMPLANT
CATH CPS LOCATOR 3D MED (CATHETERS) IMPLANT
LEAD ULTIPACE 52 LPA1231/52 (Lead) IMPLANT
LEAD ULTIPACE 65 LPA1231/65 (Lead) IMPLANT
PACEMAKER ASSURITY DR-RF (Pacemaker) IMPLANT
PAD DEFIB RADIO PHYSIO CONN (PAD) ×1 IMPLANT
SHEATH 7FR PRELUDE SNAP 13 (SHEATH) IMPLANT
SHEATH 9FR PRELUDE SNAP 13 (SHEATH) IMPLANT
SHEATH PROBE COVER 6X72 (BAG) IMPLANT
SLITTER AGILIS HISPRO (INSTRUMENTS) IMPLANT
TOOL HELIX LOCKING (MISCELLANEOUS) IMPLANT
TRAY PACEMAKER INSERTION (PACKS) ×1 IMPLANT
WIRE HI TORQ VERSACORE-J 145CM (WIRE) IMPLANT

## 2024-02-10 NOTE — Progress Notes (Signed)
 NAME:  Colin Ramirez, MRN:  [id number], DOB:  May 27, 1966, LOS: 14 ADMISSION DATE:  01/24/2024, CONSULTATION DATE:  02/06/24 REFERRING MD:  Dr. Shyrl, CHIEF COMPLAINT:  post op   History of Present Illness:  Colin Ramirez is a 58 y.o. male who has a PMH as below including but not limited to CAD (LHC in May 2019 with 2v disease with moderate stenosis), HTN, DM2. He was admitted to [facility] on 01/24/24 with chest pain.   An echocardiogram was performed on 7/30 that demonstrated an EF of 55 to 60%, calcified aortic valve, mildly reduced RV systolic function, mildly dilated LA, aortic dilatation with an aneurysm of the ascending aorta. On 7/31 he had L/RHC that revealed severe multivessel disease. He was evaluated by thoracic surgery for consideration of a CABG/AVR/Bentall.   On 02/06/2024, he was taken to the operating room where he had CABG x 3 (LIMA LAD, RSVG PDA DIAG), Bentall, ascending aortic arch replacement.  Did have some reperfusion arrest and longer pump run leading to oozing post.   Operatively, PCCM was asked to see in consultation for medical management assistance.  Pertinent  Medical History  Primary hypertension; HLD (hyperlipidemia); GERD (gastroesophageal reflux disease); Sleep apnea; Class 1 obesity due to excess calories with serious comorbidity and body mass index (BMI) of 33.0 to 33.9 in adult; Degenerative disc disease; Tobacco abuse; Hypothyroidism; Unilateral primary osteoarthritis, left hip; Status post left hip replacement; Atherosclerosis of native coronary artery with angina pectoris (HCC); Bilateral hearing loss; Facial twitching; Sigmoid diverticulitis; DM2 (diabetes mellitus, type 2) (HCC); Hx of adenomatous colonic polyps; Dermatitis; Localized edema; Microalbuminuria; Atypical chest pain; Hypokalemia; Unstable angina (HCC); Aneurysm of ascending aorta without rupture (HCC); Aortic stenosis with bicuspid valve; and Acute heart failure with preserved ejection fraction  (HCC) on their problem list.  Significant Hospital Events: Including procedures, antibiotic start and stop dates in addition to other pertinent events   7/29 admit 7/31 L/RHC 8/6 to OR for extraction of tooth # 18 8/11 to OR for bentall, CABG x 3, AVR 8/13 extubated, remains on Levophed  and V paced 8/15 plan for PPM   Interim History / Subjective:  Pain remains well controlled, up OOB Minimal CT output Off norepi NPO for PPM Passing flatus but no BM  Objective    Blood pressure 129/81, pulse 80, temperature 98.7 F (37.1 C), temperature source Oral, resp. rate (!) 22, height (P) 6' 1 (1.854 m), weight 115.7 kg, SpO2 95%.        Intake/Output Summary (Last 24 hours) at 02/10/2024 0723 Last data filed at 02/10/2024 0700 Gross per 24 hour  Intake 680.06 ml  Output 1865 ml  Net -1184.94 ml   Filed Weights   02/08/24 0500 02/09/24 0633 02/10/24 0702  Weight: 115.9 kg 115.9 kg 115.7 kg      Examination: General:  sitting up in chair, NAD HEENT: MM pink/moist, PERRLA Neuro: Aox3, MAE CV: remains vpaced, sternal dressing cdi, minimal output from CT, underlying bradycardia PULM:  clear bilaterally on RA without distress  GI: obese, mild distention, non-tender to palpation Extremities: warm/dry, no edema   Labs reviewed:  Hgb drifting 8.1 Glucose 212 Creatinine 0.74  Resolved problem list   Postoperative ventilator management, resolved AKI  Assessment and Plan   Multivessel CAD with ascending thoracic aortic aneurysm and bicuspid aortic valve stenosis - s/p CABG Bentall and Ascending arch replacement for aneurysm Hx essential HTN, HLD Complete HB Expected post op ABLA Residual Post bypass vasoplegia Tobacco dependence -  known history of 1.5ppd x 30 years. Remains V-paced off norepi today, on midodrine  - post-op management per TCTS -PPM this after -plan to leave CT until after procedure -continue midodrine  for now, can  likely d/c soon - tele  monitoring - mediastinal drains per TCTS, monitor h/h - multimodal pain control per protocol, cont current regimen with lidocaine  patches, gabapentin , tramadol , and resume home prn TID dilaudid  PO w/ bowel regimen, feels better with robaxin   - ASA, statin - metoprolol  BID and diuresis once off pressors - monitor electrolytes, replete PRN - GDMT per primary - PCCM will continue to follow while in ICU - smoking cessation -sorbitol  x1 today for constipation    Hx DM2 --glucose goal 140-180 today -meal coverage novolog  added, continue resistant scale SSI and semglee  30 units bid    Hx GERD - PPI   Hx Hypothyroidism - Continue PTA Synthroid    Hx bilateral hip pain/sciatica, peripheral neuropathy, chronic pain - Continue Gabapentin , resume prn home chronic prn dilaudid , states takes mostly TID regularly w/ bowel regimen,   Unilateral pinkeye Hordeolum. - Continue warm compresses and Erythromycin  ointment  OSA - ABG reassuring - cont nightly CPAP    Best Practice (right click and Reselect all SmartList Selections daily)   Diet/type: NPO DVT prophylaxis SCD Pressure ulcer(s): N/A GI prophylaxis: PPI Lines: Central line and Arterial Line Foley:  N/A Code Status:  full code Last date of multidisciplinary goals of care discussion [per primary]  Labs   CBC: Recent Labs  Lab 02/07/24 0438 02/07/24 0448 02/07/24 1727 02/08/24 0347 02/09/24 0841 02/10/24 0430  WBC 10.5  --  16.6* 12.5* 10.0 7.6  HGB 10.2* 10.2* 10.7* 9.3* 8.5* 8.1*  HCT 31.7* 30.0* 34.0* 29.0* 26.8* 25.8*  MCV 92.2  --  94.2 92.4 93.7 93.8  PLT 162  --  169 162 127* 152    Basic Metabolic Panel: Recent Labs  Lab 02/05/24 0253 02/05/24 2314 02/07/24 0438 02/07/24 0448 02/07/24 1638 02/07/24 1836 02/08/24 0347 02/09/24 0841 02/10/24 0430  NA 139   < > 139   < > 141 139 137 136 134*  K 3.7   < > 4.0   < > 3.1* 4.1 4.3 4.4 3.8  CL 103   < > 110  --  118* 108 107 102 103  CO2 26   < > 22  --   16* 20* 23 27 28   GLUCOSE 166*   < > 141*  --  104* 144* 120* 229* 212*  BUN 16   < > 23*  --  21* 27* 29* 21* 17  CREATININE 0.83   < > 0.95  --  0.84 1.30* 1.01 0.95 0.74  CALCIUM  9.2   < > 8.2*  --  6.4* 8.6* 8.4* 8.4* 7.6*  MG 2.2  --  2.5*  --  1.9  --  2.5*  --  1.9   < > = values in this interval not displayed.   GFR: Estimated Creatinine Clearance: 130.8 mL/min (by C-G formula based on SCr of 0.74 mg/dL). Recent Labs  Lab 02/07/24 1727 02/08/24 0347 02/09/24 0841 02/10/24 0430  WBC 16.6* 12.5* 10.0 7.6    Liver Function Tests: Recent Labs  Lab 02/05/24 2314  AST 26  ALT 44  ALKPHOS 44  BILITOT 0.3  PROT 6.5  ALBUMIN  3.2*   No results for input(s): LIPASE, AMYLASE in the last 168 hours. No results for input(s): AMMONIA in the last 168 hours.  ABG    Component  Value Date/Time   PHART 7.359 02/07/2024 0448   PCO2ART 38.9 02/07/2024 0448   PO2ART 78 (L) 02/07/2024 0448   HCO3 21.8 02/07/2024 0448   TCO2 23 02/07/2024 0448   ACIDBASEDEF 3.0 (H) 02/07/2024 0448   O2SAT 94 02/07/2024 0448     Coagulation Profile: Recent Labs  Lab 02/05/24 2314 02/06/24 1802  INR 0.9 1.5*    Cardiac Enzymes: No results for input(s): CKTOTAL, CKMB, CKMBINDEX, TROPONINI in the last 168 hours.  HbA1C: Hgb A1c MFr Bld  Date/Time Value Ref Range Status  02/05/2024 11:14 PM 7.6 (H) 4.8 - 5.6 % Final    Comment:    (NOTE)         Prediabetes: 5.7 - 6.4         Diabetes: >6.4         Glycemic control for adults with diabetes: <7.0   01/25/2024 05:11 AM 7.4 (H) 4.8 - 5.6 % Final    Comment:    (NOTE) Diagnosis of Diabetes The following HbA1c ranges recommended by the American Diabetes Association (ADA) may be used as an aid in the diagnosis of diabetes mellitus.  Hemoglobin             Suggested A1C NGSP%              Diagnosis  <5.7                   Non Diabetic  5.7-6.4                Pre-Diabetic  >6.4                   Diabetic  <7.0                    Glycemic control for                       adults with diabetes.      CBG: Recent Labs  Lab 02/09/24 0634 02/09/24 1112 02/09/24 1549 02/09/24 2129 02/10/24 0629  GLUCAP 154* 171* 183* 133* 137*   Allergies Allergies  Allergen Reactions   Omnicef  [Cefdinir ] Rash     Home Medications  Prior to Admission medications   Medication Sig Start Date End Date Taking? Authorizing Provider  amLODipine  (NORVASC ) 10 MG tablet Take 1 tablet (10 mg total) by mouth daily. 08/25/21 01/25/24 Yes Oley Bascom RAMAN, NP  aspirin  81 MG EC tablet TAKE 1 TABLET DAILY. 08/25/21  Yes Oley Bascom RAMAN, NP  atorvastatin  (LIPITOR) 80 MG tablet Take 1 tablet (80 mg total) by mouth daily. Patient taking differently: Take 80 mg by mouth at bedtime. 08/25/21  Yes Oley Bascom RAMAN, NP  carvedilol  (COREG ) 6.25 MG tablet Take 1 tablet (6.25 mg total) by mouth 2 (two) times daily. 08/25/21  Yes Oley Bascom RAMAN, NP  dapagliflozin  propanediol (FARXIGA ) 10 MG TABS tablet Take 1 tablet (10 mg total) by mouth daily. 08/25/21  Yes Oley Bascom RAMAN, NP  esomeprazole  (NEXIUM ) 40 MG capsule Take 1 capsule (40 mg total) by mouth daily. 08/25/21  Yes Oley Bascom RAMAN, NP  hydrochlorothiazide  (HYDRODIURIL ) 25 MG tablet TAKE 1 TABLET BY MOUTH EVERY DAY 05/04/21  Yes Tanda Bleacher, MD  HYDROmorphone  (DILAUDID ) 4 MG tablet Take 4 mg by mouth every 4 (four) hours as needed for severe pain (pain score 7-10).   Yes [provider]  levothyroxine  (SYNTHROID ) 150 MCG tablet Take 1 tablet (150 mcg total)  by mouth daily. 08/25/21  Yes Oley Bascom RAMAN, NP  metFORMIN  (GLUCOPHAGE -XR) 500 MG 24 hr tablet Take 2 tablets (1,000 mg total) by mouth 2 (two) times daily with a meal. 08/25/21  Yes Oley Bascom RAMAN, NP  nitroGLYCERIN  (NITROSTAT ) 0.4 MG SL tablet Place 1 tablet (0.4 mg total) under the tongue every 5 (five) minutes as needed for chest pain. 05/30/19  Yes Theotis Haze ORN, NP  ramipril  (ALTACE ) 10 MG capsule Take 1 capsule  (10 mg total) by mouth daily. 08/25/21  Yes Oley Bascom RAMAN, NP  Misc. Devices MISC Dispense CPAP mask, tank, and tubing for patient use nightly. 08/30/19   [name], MD  triamcinolone  cream (KENALOG ) 0.1 % Apply 1 application topically 2 (two) times daily. 02/10/21   Mayers, Kirk RAMAN, PA-C     Critical care time:         Leita SAUNDERS Inita Uram, PA-C [facility] See Amion for pager If no response to pager , please call [phone] until 7pm After 7:00 pm call Elink  [phone]

## 2024-02-10 NOTE — Progress Notes (Signed)
  Progress Note  Patient Name: Colin Ramirez Date of Encounter: 02/10/2024 [facility] Cardiologist: MARALEE ELSPETH BROCKS, MD   Interval Summary   No acute overnight events. Patient reports feeling relatively well. No new or acute complaints.   No underlying at VVI 30bpm. Remains in CHB. Capture threshold 6 today.  Vital Signs Vitals:   02/10/24 0700 02/10/24 0702 02/10/24 0715 02/10/24 0730  BP: 129/81 129/81 108/73 91/63  Pulse: 80 80 83 79  Resp: (!) 26 (!) 22 17 (!) 25  Temp: 98.7 F (37.1 C)     TempSrc: Oral     SpO2: (!) 89% 95% 96% 92%  Weight:  115.7 kg    Height:        Intake/Output Summary (Last 24 hours) at 02/10/2024 0756 Last data filed at 02/10/2024 0700 Gross per 24 hour  Intake 676.3 ml  Output 1865 ml  Net -1188.7 ml      02/10/2024    7:02 AM 02/09/2024    6:33 AM 02/08/2024    5:00 AM  Last 3 Weights  Weight (lbs) 255 lb 1.2 oz 255 lb 8.2 oz 255 lb 8.2 oz  Weight (kg) 115.7 kg 115.9 kg 115.9 kg      Telemetry/ECG  V pacing - Personally Reviewed  Physical Exam  GEN: No acute distress.   Neck: No JVD Cardiac: Normal rate, regular Respiratory: Clear to auscultation bilaterally. GI: Soft, nontender, non-distended  MS: No edema  Assessment: Colin Ramirez is a 58 y.o. male with a history of 1AVB, HTN, HLD, CAD, DM & tobacco abuse admitted 01/24/24 with chest pain. Now s/p CABG / AVR / Bentall procedure on 02/06/24.     Plan:  #Post Op complete heart block:  - Continue to monitor for recovery. POD#4 today. If no recovery by this afternoon then permanent pacer implant.  - If possible, please remove all tubes/drains to decrease infectious risk prior to implant.   Explained risks, benefits, and alternatives to pacemaker implantation, including but not limited to bleeding, infection, damage to heart or lungs, heart attack, stroke, or death.  Pt verbalized understanding and agrees to proceed if indicated.    For questions or updates,  please contact [facility] Please consult [email or web address] for contact info under       Signed, [name], MD

## 2024-02-10 NOTE — Progress Notes (Signed)
 Returned from pacemaker placement. Sling applied to left arm and pt verbalizes understanding to keep it immobilized. Orders reviewed.

## 2024-02-10 NOTE — Plan of Care (Signed)
  Problem: Education: Goal: Knowledge of General Education information will improve Description: Including pain rating scale, medication(s)/side effects and non-pharmacologic comfort measures Outcome: Progressing   Problem: Health Behavior/Discharge Planning: Goal: Ability to manage health-related needs will improve Outcome: Progressing   Problem: Clinical Measurements: Goal: Ability to maintain clinical measurements within normal limits will improve Outcome: Progressing Goal: Will remain free from infection Outcome: Progressing Goal: Cardiovascular complication will be avoided Outcome: Progressing   Problem: Nutrition: Goal: Adequate nutrition will be maintained Outcome: Progressing   Problem: Coping: Goal: Level of anxiety will decrease Outcome: Progressing   Problem: Elimination: Goal: Will not experience complications related to urinary retention Outcome: Progressing   Problem: Pain Managment: Goal: General experience of comfort will improve and/or be controlled Outcome: Progressing

## 2024-02-10 NOTE — H&P (View-Only) (Signed)
  Progress Note  Patient Name: Colin Ramirez Date of Encounter: 02/10/2024 [facility] Cardiologist: Colin ELSPETH BROCKS, MD   Interval Summary   No acute overnight events. Patient reports feeling relatively well. No new or acute complaints.   No underlying at VVI 30bpm. Remains in CHB. Capture threshold 6 today.  Vital Signs Vitals:   02/10/24 0700 02/10/24 0702 02/10/24 0715 02/10/24 0730  BP: 129/81 129/81 108/73 91/63  Pulse: 80 80 83 79  Resp: (!) 26 (!) 22 17 (!) 25  Temp: 98.7 F (37.1 C)     TempSrc: Oral     SpO2: (!) 89% 95% 96% 92%  Weight:  115.7 kg    Height:        Intake/Output Summary (Last 24 hours) at 02/10/2024 0756 Last data filed at 02/10/2024 0700 Gross per 24 hour  Intake 676.3 ml  Output 1865 ml  Net -1188.7 ml      02/10/2024    7:02 AM 02/09/2024    6:33 AM 02/08/2024    5:00 AM  Last 3 Weights  Weight (lbs) 255 lb 1.2 oz 255 lb 8.2 oz 255 lb 8.2 oz  Weight (kg) 115.7 kg 115.9 kg 115.9 kg      Telemetry/ECG  V pacing - Personally Reviewed  Physical Exam  GEN: No acute distress.   Neck: No JVD Cardiac: Normal rate, regular Respiratory: Clear to auscultation bilaterally. GI: Soft, nontender, non-distended  MS: No edema  Assessment: Colin Ramirez is a 58 y.o. male with a history of 1AVB, HTN, HLD, CAD, DM & tobacco abuse admitted 01/24/24 with chest pain. Now s/p CABG / AVR / Bentall procedure on 02/06/24.     Plan:  #Post Op complete heart block:  - Continue to monitor for recovery. POD#4 today. If no recovery by this afternoon then permanent pacer implant.  - If possible, please remove all tubes/drains to decrease infectious risk prior to implant.   Explained risks, benefits, and alternatives to pacemaker implantation, including but not limited to bleeding, infection, damage to heart or lungs, heart attack, stroke, or death.  Pt verbalized understanding and agrees to proceed if indicated.    For questions or updates,  please contact [facility] Please consult [email or web address] for contact info under       Signed, [name], MD

## 2024-02-10 NOTE — Progress Notes (Signed)
 [address]       [place] [address]             [phone]                 4 Days Post-Op Procedure(s) (LRB): CORONARY ARTERY BYPASS GRAFTING (CABG) x THREE USING LEFT INTERNAL MAMMARY ARTERY AND ENDOSCOPICALLY HARVESTED RIGHT GREATER SAPHENOUS VEIN (N/A) BENTALL PROCEDURE AND HEMIARCH USING HEMASHIELD PLATINUM VASCULAR GRAFT AND KONECT RESILIA AORTIC VALVE CONDUIT, RIGHT AXILLARY CANNULATION USING HEMASHIELD PLATINUM VASCULAR GRAFT (N/A) ECHOCARDIOGRAM, TRANSESOPHAGEAL, INTRAOPERATIVE (N/A)   Events: No events _______________________________________________________________ Vitals: BP 91/63   Pulse 79   Temp 98.7 F (37.1 C) (Oral)   Resp (!) 25   Ht (P) 6' 1 (1.854 m)   Wt 115.7 kg   SpO2 92%   BMI (P) 33.65 kg/m  Filed Weights   02/08/24 0500 02/09/24 0633 02/10/24 0702  Weight: 115.9 kg 115.9 kg 115.7 kg     - Neuro: alert NAd  - Cardiovascular: paced  Drips: none.      - Pulm: EWOB    ABG    Component Value Date/Time   PHART 7.359 02/07/2024 0448   PCO2ART 38.9 02/07/2024 0448   PO2ART 78 (L) 02/07/2024 0448   HCO3 21.8 02/07/2024 0448   TCO2 23 02/07/2024 0448   ACIDBASEDEF 3.0 (H) 02/07/2024 0448   O2SAT 94 02/07/2024 0448    - Abd: ND - Extremity: warm  .Intake/Output      08/14 0701 08/15 0700 08/15 0701 08/16 0700   P.O. 585    I.V. (mL/kg) 97.1 (0.8)    IV Piggyback     Total Intake(mL/kg) 682.1 (5.9)    Urine (mL/kg/hr) 1680 (0.6)    Chest Tube 185    Total Output 1865    Net -1182.9         Urine Occurrence 3 x       _______________________________________________________________ Labs:    Latest Ref Rng & Units 02/10/2024    4:30 AM 02/09/2024    8:41 AM 02/08/2024    3:47 AM  CBC  WBC 4.0 - 10.5 K/uL 7.6  10.0  12.5   Hemoglobin 13.0 - 17.0 g/dL 8.1  8.5  9.3   Hematocrit 39.0 - 52.0 % 25.8  26.8  29.0   Platelets 150 - 400 K/uL 152  127  162       Latest Ref Rng & Units  02/10/2024    4:30 AM 02/09/2024    8:41 AM 02/08/2024    3:47 AM  CMP  Glucose 70 - 99 mg/dL 787  770  879   BUN 6 - 20 mg/dL 17  21  29    Creatinine 0.61 - 1.24 mg/dL 9.25  9.04  8.98   Sodium 135 - 145 mmol/L 134  136  137   Potassium 3.5 - 5.1 mmol/L 3.8  4.4  4.3   Chloride 98 - 111 mmol/L 103  102  107   CO2 22 - 32 mmol/L 28  27  23    Calcium  8.9 - 10.3 mg/dL 7.6  8.4  8.4     CXR: -  _______________________________________________________________  Assessment and Plan: POD 3. s/p CABG, Bentall, Hemiarch  Neuro: pain controlled CV: PPM today.  Will remove epicardials, and start BB after Pulm: will remove Cts after PPM placed Renal: creat stable.  Diuresis after PPM GI: on diet Heme: stable ID: [email or web address] Endo: SSI Dispo: floor tomorrow  Linnie MALVA Rayas 02/10/2024 9:29 AM

## 2024-02-10 NOTE — Interval H&P Note (Signed)
 History and Physical Interval Note:  02/10/2024 2:53 PM  Colin Ramirez  has presented today for surgery, with the diagnosis of heart block.  The various methods of treatment have been discussed with the patient and family. After consideration of risks, benefits and other options for treatment, the patient has consented to  Procedure(s): PACEMAKER IMPLANT (N/A) as a surgical intervention.  The patient's history has been reviewed, patient examined, no change in status, stable for surgery.  I have reviewed the patient's chart and labs.  Questions were answered to the patient's satisfaction.     [name]

## 2024-02-11 ENCOUNTER — Encounter (HOSPITAL_COMMUNITY): Payer: Self-pay | Admitting: Cardiology

## 2024-02-11 ENCOUNTER — Inpatient Hospital Stay (HOSPITAL_COMMUNITY): Payer: PRIVATE HEALTH INSURANCE

## 2024-02-11 DIAGNOSIS — Z950 Presence of cardiac pacemaker: Secondary | ICD-10-CM

## 2024-02-11 DIAGNOSIS — R0789 Other chest pain: Secondary | ICD-10-CM

## 2024-02-11 LAB — CBC
HCT: 28 % — ABNORMAL LOW (ref 39.0–52.0)
Hemoglobin: 8.7 g/dL — ABNORMAL LOW (ref 13.0–17.0)
MCH: 29.7 pg (ref 26.0–34.0)
MCHC: 31.1 g/dL (ref 30.0–36.0)
MCV: 95.6 fL (ref 80.0–100.0)
Platelets: 222 K/uL (ref 150–400)
RBC: 2.93 MIL/uL — ABNORMAL LOW (ref 4.22–5.81)
RDW: 16.1 % — ABNORMAL HIGH (ref 11.5–15.5)
WBC: 7.2 K/uL (ref 4.0–10.5)
nRBC: 0 % (ref 0.0–0.2)

## 2024-02-11 LAB — BASIC METABOLIC PANEL WITH GFR
Anion gap: 8 (ref 5–15)
BUN: 16 mg/dL (ref 6–20)
CO2: 27 mmol/L (ref 22–32)
Calcium: 8.4 mg/dL — ABNORMAL LOW (ref 8.9–10.3)
Chloride: 101 mmol/L (ref 98–111)
Creatinine, Ser: 0.78 mg/dL (ref 0.61–1.24)
GFR, Estimated: >60 mL/min (ref >60)
Glucose, Bld: 283 mg/dL — ABNORMAL HIGH (ref 70–99)
Potassium: 3.8 mmol/L (ref 3.5–5.1)
Sodium: 136 mmol/L (ref 135–145)

## 2024-02-11 LAB — GLUCOSE, CAPILLARY
Glucose-Capillary: 116 mg/dL — ABNORMAL HIGH (ref 70–99)
Glucose-Capillary: 185 mg/dL — ABNORMAL HIGH (ref 70–99)
Glucose-Capillary: 221 mg/dL — ABNORMAL HIGH (ref 70–99)
Glucose-Capillary: 111 mg/dL — ABNORMAL HIGH (ref 70–99)

## 2024-02-11 MED ORDER — FE FUM-VIT C-VIT B12-FA 460-60-0.01-1 MG PO CAPS
1.0000 | ORAL_CAPSULE | Freq: Every day | ORAL | Status: DC
  Administered 2024-02-11 – 2024-02-12 (×2): 1 via ORAL
  Filled 2024-02-11 (×2): qty 1

## 2024-02-11 MED ORDER — SODIUM CHLORIDE 0.9 % IV SOLN: 250.0000 mL | INTRAVENOUS | Status: DC | PRN

## 2024-02-11 MED ORDER — SODIUM CHLORIDE 0.9% FLUSH: 3.0000 mL | INTRAVENOUS | Status: DC | PRN

## 2024-02-11 MED ORDER — SODIUM CHLORIDE 0.9% FLUSH: 3.0000 mL | Freq: Two times a day (BID) | INTRAVENOUS | Status: DC

## 2024-02-11 MED ORDER — METFORMIN HCL ER 500 MG PO TB24
1000.0000 mg | ORAL_TABLET | Freq: Two times a day (BID) | ORAL | Status: DC
  Administered 2024-02-11 – 2024-02-12 (×2): 1000 mg via ORAL
  Filled 2024-02-11 (×3): qty 2

## 2024-02-11 MED ORDER — ~~LOC~~ CARDIAC SURGERY, PATIENT & FAMILY EDUCATION: Freq: Once | Status: AC

## 2024-02-11 MED ORDER — INSULIN GLARGINE-YFGN 100 UNIT/ML ~~LOC~~ SOLN
30.0000 [IU] | Freq: Every day | SUBCUTANEOUS | Status: DC
  Administered 2024-02-11 – 2024-02-12 (×2): 30 [IU] via SUBCUTANEOUS
  Filled 2024-02-11 (×2): qty 0.3

## 2024-02-11 MED ORDER — FUROSEMIDE 40 MG PO TABS
40.0000 mg | ORAL_TABLET | Freq: Every day | ORAL | Status: DC
  Administered 2024-02-11 – 2024-02-12 (×2): 40 mg via ORAL
  Filled 2024-02-11 (×2): qty 1

## 2024-02-11 MED ORDER — ASPIRIN 325 MG PO TBEC
325.0000 mg | DELAYED_RELEASE_TABLET | Freq: Every day | ORAL | Status: DC
  Administered 2024-02-12: 325 mg via ORAL
  Filled 2024-02-11: qty 1

## 2024-02-11 MED ORDER — POTASSIUM CHLORIDE CRYS ER 20 MEQ PO TBCR
20.0000 meq | EXTENDED_RELEASE_TABLET | Freq: Two times a day (BID) | ORAL | Status: DC
  Administered 2024-02-11 – 2024-02-12 (×3): 20 meq via ORAL
  Filled 2024-02-11 (×3): qty 1

## 2024-02-11 MED ORDER — ASPIRIN 325 MG PO TBEC: 325.0000 mg | DELAYED_RELEASE_TABLET | Freq: Every day | ORAL | Status: DC

## 2024-02-11 NOTE — Progress Notes (Signed)
 02/11/2024 Doing well, likely to 4E. Will be available as needed.  Rolan Sharps MD PCCM

## 2024-02-11 NOTE — Plan of Care (Signed)
  Problem: Education: Goal: Knowledge of General Education information will improve Description: Including pain rating scale, medication(s)/side effects and non-pharmacologic comfort measures Outcome: Progressing   Problem: Health Behavior/Discharge Planning: Goal: Ability to manage health-related needs will improve Outcome: Progressing   Problem: Clinical Measurements: Goal: Ability to maintain clinical measurements within normal limits will improve Outcome: Progressing Goal: Will remain free from infection Outcome: Progressing Goal: Diagnostic test results will improve Outcome: Progressing Goal: Cardiovascular complication will be avoided Outcome: Progressing   Problem: Activity: Goal: Risk for activity intolerance will decrease Outcome: Progressing   Problem: Coping: Goal: Level of anxiety will decrease Outcome: Progressing   Problem: Elimination: Goal: Will not experience complications related to bowel motility Outcome: Progressing Goal: Will not experience complications related to urinary retention Outcome: Progressing   Problem: Pain Managment: Goal: General experience of comfort will improve and/or be controlled Outcome: Progressing   Problem: Safety: Goal: Ability to remain free from injury will improve Outcome: Progressing   Problem: Skin Integrity: Goal: Risk for impaired skin integrity will decrease Outcome: Progressing   Problem: Coping: Goal: Ability to adjust to condition or change in health will improve Outcome: Progressing   Problem: Health Behavior/Discharge Planning: Goal: Ability to identify and utilize available resources and services will improve Outcome: Progressing Goal: Ability to manage health-related needs will improve Outcome: Progressing   Problem: Metabolic: Goal: Ability to maintain appropriate glucose levels will improve Outcome: Progressing   Problem: Nutritional: Goal: Maintenance of adequate nutrition will improve Outcome:  Progressing   Problem: Skin Integrity: Goal: Risk for impaired skin integrity will decrease Outcome: Progressing   Problem: Education: Goal: Will demonstrate proper wound care and an understanding of methods to prevent future damage Outcome: Progressing Goal: Knowledge of disease or condition will improve Outcome: Progressing Goal: Knowledge of the prescribed therapeutic regimen will improve Outcome: Progressing   Problem: Activity: Goal: Risk for activity intolerance will decrease Outcome: Progressing   Problem: Clinical Measurements: Goal: Postoperative complications will be avoided or minimized Outcome: Progressing   Problem: Skin Integrity: Goal: Wound healing without signs and symptoms of infection Outcome: Progressing Goal: Risk for impaired skin integrity will decrease Outcome: Progressing   Problem: Urinary Elimination: Goal: Ability to achieve and maintain adequate renal perfusion and functioning will improve Outcome: Progressing   Problem: Education: Goal: Knowledge of cardiac device and self-care will improve Outcome: Progressing Goal: Ability to safely manage health related needs after discharge will improve Outcome: Progressing

## 2024-02-11 NOTE — Progress Notes (Signed)
 1 Day Post-Op Procedure(s) (LRB): PACEMAKER IMPLANT (N/A) Subjective:  No complaints. Feels much better today. Had BM.  Objective: Vital signs in last 24 hours: Temp:  [97.6 F (36.4 C)-99 F (37.2 C)] 97.6 F (36.4 C) (08/16 0800) Pulse Rate:  [0-100] 62 (08/16 0639) Cardiac Rhythm: Ventricular paced (08/16 0000) Resp:  [9-34] 21 (08/16 0639) BP: (87-111)/(59-79) 100/69 (08/16 0600) SpO2:  [88 %-97 %] 91 % (08/16 0639) FiO2 (%):  [36 %] 36 % (08/16 0008) Weight:  [115.6 kg] 115.6 kg (08/16 0500)  Hemodynamic parameters for last 24 hours:    Intake/Output from previous day: 08/15 0701 - 08/16 0700 In: 1216.4 [P.O.:518; I.V.:408.4; IV Piggyback:290] Out: 1635 [Urine:1625; Chest Tube:10] Intake/Output this shift: No intake/output data recorded.  General appearance: alert and cooperative Neurologic: intact Heart: regular rate and rhythm Lungs: clear to auscultation bilaterally Extremities: edema mild in legs Wound: incisions healing well.  Lab Results: Recent Labs    02/09/24 0841 02/10/24 0430  WBC 10.0 7.6  HGB 8.5* 8.1*  HCT 26.8* 25.8*  PLT 127* 152   BMET:  Recent Labs    02/09/24 0841 02/10/24 0430  NA 136 134*  K 4.4 3.8  CL 102 103  CO2 27 28  GLUCOSE 229* 212*  BUN 21* 17  CREATININE 0.95 0.74  CALCIUM  8.4* 7.6*    PT/INR: No results for input(s): LABPROT, INR in the last 72 hours. ABG    Component Value Date/Time   PHART 7.359 02/07/2024 0448   HCO3 21.8 02/07/2024 0448   TCO2 23 02/07/2024 0448   ACIDBASEDEF 3.0 (H) 02/07/2024 0448   O2SAT 94 02/07/2024 0448   CBG (last 3)  Recent Labs    02/10/24 1643 02/10/24 2035 02/11/24 0645  GLUCAP 103* 127* 116*   CXR: left basilar atelectasis  ECG: sinus 73, 1st degree AV block  Assessment/Plan: POD 5 CABG, Bentall with KONECT conduit, Hemi-arch ascending aortic replacement.  POD 1 S/P Procedure(s) (LRB): PACEMAKER IMPLANT (N/A)  He is hemodynamically stable on midodrine .  Will continue for now. No beta blocker due to CHB.  DC temp pacing wires   DC neck line.  Wt is still 9 lbs over preop. Continue diuresis.  DM: glucose under good control. Will resume Metformin  and decrease SEMGLEE  to 30 daily. Continue SSI. Preop Hgb A1C was 7.6.  Continue IS, ambulation.  I think he can go to 4E later today if stable after pacing wire removal.   LOS: 15 days    Colin Ramirez 02/11/2024

## 2024-02-11 NOTE — Progress Notes (Signed)
   EP Rounding Note    Patient Name: Colin Ramirez Date of Encounter: 02/11/2024  [facility] Cardiologist: MARALEE ELSPETH BROCKS, MD   Subjective   Doing well after PPM implant yesterday. In chair this AM.  Vital Signs    Vitals:   02/11/24 0400 02/11/24 0500 02/11/24 0600 02/11/24 0639  BP: 110/68 103/67 100/69   Pulse: 60 60 60 62  Resp: 13 (!) 9 11 (!) 21  Temp:      TempSrc:      SpO2: 95% 92% 92% 91%  Weight:  115.6 kg    Height:        Intake/Output Summary (Last 24 hours) at 02/11/2024 0754 Last data filed at 02/11/2024 0600 Gross per 24 hour  Intake 1216.37 ml  Output 1635 ml  Net -418.63 ml      02/11/2024    5:00 AM 02/10/2024    7:02 AM 02/09/2024    6:33 AM  Last 3 Weights  Weight (lbs) 254 lb 13.6 oz 255 lb 1.2 oz 255 lb 8.2 oz  Weight (kg) 115.6 kg 115.7 kg 115.9 kg      Telemetry    Personally Reviewed  ECG    Personally Reviewed  Physical Exam   GEN: No acute distress.   Cardiac: RRR, no murmurs, rubs, or gallops. L prepectoral pocket healing well. Respiratory: Clear to auscultation bilaterally. Psych: Normal affect   Assessment & Plan    #CHB #PPM in situ Doing well after PPM yesterday. Device interrogation and chest xray show stable lead position/function.  EP will sign off. Please call with questions or concerns.    Ole T. Cindie, MD, [facility], [facility] Cardiac Electrophysiology

## 2024-02-11 NOTE — Plan of Care (Signed)
 Problem: Education: Goal: Knowledge of General Education information will improve Description: Including pain rating scale, medication(s)/side effects and non-pharmacologic comfort measures Outcome: Progressing   Problem: Health Behavior/Discharge Planning: Goal: Ability to manage health-related needs will improve Outcome: Progressing   Problem: Clinical Measurements: Goal: Ability to maintain clinical measurements within normal limits will improve Outcome: Progressing Goal: Will remain free from infection Outcome: Progressing Goal: Diagnostic test results will improve Outcome: Progressing Goal: Respiratory complications will improve Outcome: Progressing Goal: Cardiovascular complication will be avoided Outcome: Progressing   Problem: Activity: Goal: Risk for activity intolerance will decrease Outcome: Progressing   Problem: Nutrition: Goal: Adequate nutrition will be maintained Outcome: Progressing   Problem: Coping: Goal: Level of anxiety will decrease Outcome: Progressing   Problem: Elimination: Goal: Will not experience complications related to bowel motility Outcome: Progressing Goal: Will not experience complications related to urinary retention Outcome: Progressing   Problem: Pain Managment: Goal: General experience of comfort will improve and/or be controlled Outcome: Progressing   Problem: Safety: Goal: Ability to remain free from injury will improve Outcome: Progressing   Problem: Skin Integrity: Goal: Risk for impaired skin integrity will decrease Outcome: Progressing   Problem: Education: Goal: Ability to describe self-care measures that may prevent or decrease complications (Diabetes Survival Skills Education) will improve Outcome: Progressing Goal: Individualized Educational Video(s) Outcome: Progressing   Problem: Coping: Goal: Ability to adjust to condition or change in health will improve Outcome: Progressing   Problem: Fluid  Volume: Goal: Ability to maintain a balanced intake and output will improve Outcome: Progressing   Problem: Health Behavior/Discharge Planning: Goal: Ability to identify and utilize available resources and services will improve Outcome: Progressing Goal: Ability to manage health-related needs will improve Outcome: Progressing   Problem: Metabolic: Goal: Ability to maintain appropriate glucose levels will improve Outcome: Progressing   Problem: Nutritional: Goal: Maintenance of adequate nutrition will improve Outcome: Progressing Goal: Progress toward achieving an optimal weight will improve Outcome: Progressing   Problem: Skin Integrity: Goal: Risk for impaired skin integrity will decrease Outcome: Progressing   Problem: Tissue Perfusion: Goal: Adequacy of tissue perfusion will improve Outcome: Progressing   Problem: Education: Goal: Understanding of CV disease, CV risk reduction, and recovery process will improve Outcome: Progressing Goal: Individualized Educational Video(s) Outcome: Progressing   Problem: Activity: Goal: Ability to return to baseline activity level will improve Outcome: Progressing   Problem: Cardiovascular: Goal: Ability to achieve and maintain adequate cardiovascular perfusion will improve Outcome: Progressing Goal: Vascular access site(s) Level 0-1 will be maintained Outcome: Progressing   Problem: Health Behavior/Discharge Planning: Goal: Ability to safely manage health-related needs after discharge will improve Outcome: Progressing   Problem: Education: Goal: Will demonstrate proper wound care and an understanding of methods to prevent future damage Outcome: Progressing Goal: Knowledge of disease or condition will improve Outcome: Progressing Goal: Knowledge of the prescribed therapeutic regimen will improve Outcome: Progressing Goal: Individualized Educational Video(s) Outcome: Progressing   Problem: Activity: Goal: Risk for activity  intolerance will decrease Outcome: Progressing   Problem: Cardiac: Goal: Will achieve and/or maintain hemodynamic stability Outcome: Progressing   Problem: Clinical Measurements: Goal: Postoperative complications will be avoided or minimized Outcome: Progressing   Problem: Respiratory: Goal: Respiratory status will improve Outcome: Progressing   Problem: Skin Integrity: Goal: Wound healing without signs and symptoms of infection Outcome: Progressing Goal: Risk for impaired skin integrity will decrease Outcome: Progressing   Problem: Urinary Elimination: Goal: Ability to achieve and maintain adequate renal perfusion and functioning will improve Outcome: Progressing

## 2024-02-11 NOTE — Plan of Care (Signed)
   Problem: Education: Goal: Knowledge of General Education information will improve Description: Including pain rating scale, medication(s)/side effects and non-pharmacologic comfort measures Outcome: Progressing   Problem: Health Behavior/Discharge Planning: Goal: Ability to manage health-related needs will improve Outcome: Progressing   Problem: Nutrition: Goal: Adequate nutrition will be maintained Outcome: Progressing   Problem: Coping: Goal: Level of anxiety will decrease Outcome: Progressing   Problem: Elimination: Goal: Will not experience complications related to bowel motility Outcome: Progressing Goal: Will not experience complications related to urinary retention Outcome: Progressing   Problem: Pain Managment: Goal: General experience of comfort will improve and/or be controlled Outcome: Progressing

## 2024-02-12 LAB — GLUCOSE, CAPILLARY
Glucose-Capillary: 100 mg/dL — ABNORMAL HIGH (ref 70–99)
Glucose-Capillary: 104 mg/dL — ABNORMAL HIGH (ref 70–99)

## 2024-02-12 LAB — CBC
HCT: 27.1 % — ABNORMAL LOW (ref 39.0–52.0)
Hemoglobin: 8.4 g/dL — ABNORMAL LOW (ref 13.0–17.0)
MCH: 29.4 pg (ref 26.0–34.0)
MCHC: 31 g/dL (ref 30.0–36.0)
MCV: 94.8 fL (ref 80.0–100.0)
Platelets: 227 K/uL (ref 150–400)
RBC: 2.86 MIL/uL — ABNORMAL LOW (ref 4.22–5.81)
RDW: 16.3 % — ABNORMAL HIGH (ref 11.5–15.5)
WBC: 7.5 K/uL (ref 4.0–10.5)
nRBC: 0.3 % — ABNORMAL HIGH (ref 0.0–0.2)

## 2024-02-12 LAB — BASIC METABOLIC PANEL WITH GFR
Anion gap: 7 (ref 5–15)
BUN: 14 mg/dL (ref 6–20)
CO2: 29 mmol/L (ref 22–32)
Calcium: 8.7 mg/dL — ABNORMAL LOW (ref 8.9–10.3)
Chloride: 105 mmol/L (ref 98–111)
Creatinine, Ser: 0.82 mg/dL (ref 0.61–1.24)
GFR, Estimated: >60 mL/min (ref >60)
Glucose, Bld: 97 mg/dL (ref 70–99)
Potassium: 4 mmol/L (ref 3.5–5.1)
Sodium: 141 mmol/L (ref 135–145)

## 2024-02-12 MED ORDER — GABAPENTIN 300 MG PO CAPS: 300.0000 mg | ORAL_CAPSULE | Freq: Every day | ORAL | 0 refills | Status: DC

## 2024-02-12 MED ORDER — ATORVASTATIN CALCIUM 40 MG PO TABS: 40.0000 mg | ORAL_TABLET | Freq: Every day | ORAL | 3 refills | Status: AC

## 2024-02-12 MED ORDER — FUROSEMIDE 40 MG PO TABS: 40.0000 mg | ORAL_TABLET | Freq: Every day | ORAL | 0 refills | Status: DC

## 2024-02-12 MED ORDER — POTASSIUM CHLORIDE CRYS ER 20 MEQ PO TBCR: 20.0000 meq | EXTENDED_RELEASE_TABLET | Freq: Every day | ORAL | 0 refills | Status: DC

## 2024-02-12 MED ORDER — MIDODRINE HCL 10 MG PO TABS: 10.0000 mg | ORAL_TABLET | Freq: Three times a day (TID) | ORAL | 1 refills | Status: DC

## 2024-02-12 MED ORDER — GUAIFENESIN ER 600 MG PO TB12: 600.0000 mg | ORAL_TABLET | Freq: Two times a day (BID) | ORAL | Status: AC | PRN

## 2024-02-12 MED ORDER — GABAPENTIN 100 MG PO CAPS: 100.0000 mg | ORAL_CAPSULE | Freq: Two times a day (BID) | ORAL | 0 refills | Status: DC

## 2024-02-12 MED ORDER — NITROGLYCERIN 0.4 MG SL SUBL: 0.4000 mg | SUBLINGUAL_TABLET | SUBLINGUAL | 0 refills | Status: AC | PRN

## 2024-02-12 NOTE — TOC Transition Note (Signed)
 Transition of Care (TOC) - Discharge Note Rayfield Gobble RN, BSN Inpatient Care Management Unit 4E- RN Case Manager See Treatment Team for direct phone #   Patient Details  Name: Colin Ramirez MRN: [id number] Date of Birth: Nov 09, 1965  Transition of Care [facility]) CM/SW Contact:  Gobble Rayfield Hurst, RN Phone Number: 02/12/2024, 10:05 AM   Clinical Narrative:    Pt stable for transition home today, no DME needs noted.  Spouse to transport home.   Pt will follow up for outpt cardiac rehab once cleared by surgeon. No HH needs noted for discharge.    Final next level of care: Home/Self Care Barriers to Discharge: Barriers Resolved   Patient Goals and CMS Choice Patient states their goals for this hospitalization and ongoing recovery are:: return home   Choice offered to / list presented to : NA      Discharge Placement               Home        Discharge Plan and Services Additional resources added to the After Visit Summary for   In-house Referral: NA Discharge Planning Services: NA Post Acute Care Choice: NA          DME Arranged: N/A         HH Arranged: NA HH Agency: NA        Social Drivers of Health (SDOH) Interventions SDOH Screenings   Food Insecurity: No Food Insecurity (01/26/2024)  Housing: Low Risk  (01/26/2024)  Transportation Needs: No Transportation Needs (01/26/2024)  Utilities: Not At Risk (01/26/2024)  Alcohol Screen: Low Risk  (11/08/2018)  Depression (PHQ2-9): Low Risk  (08/25/2021)  Tobacco Use: High Risk (02/06/2024)     Readmission Risk Interventions    02/12/2024   10:05 AM  Readmission Risk Prevention Plan  Transportation Screening Complete  Home Care Screening Complete  Medication Review (RN CM) Complete

## 2024-02-12 NOTE — Progress Notes (Signed)
      [address] [place] [address]             [phone]        Contacted by patients nurse that the patient is requesting a prescription for Neurontin .  This was started by the Hospitalist Dr. Perri for Sciatic pain.  This has been working well for the patient.  The nurse was instructed to let the patient know that we will not provide this medication long term as this pain is not related to his surgery.  He will be provided a 10 day supply and he will need to reach out to the provider who manages his chronic pain or his primary care physician.  Rocky Shad, PA-C 10:54 AM 02/12/24

## 2024-02-12 NOTE — Progress Notes (Addendum)
      [address] [place] [address]             [phone]      2 Days Post-Op Procedure(s) (LRB): PACEMAKER IMPLANT (N/A)  Subjective:  Patient sitting up in chair, states he is ready to go home.  He states he had a horrible night and doesn't want to stay another day.  He is ambulating independently.  He has moved his bowels.  Objective: Vital signs in last 24 hours: Temp:  [97.6 F (36.4 C)-99.1 F (37.3 C)] 98.6 F (37 C) (08/17 0814) Pulse Rate:  [60-101] 89 (08/17 0407) Cardiac Rhythm: Normal sinus rhythm (08/16 1900) Resp:  [15-20] 18 (08/17 0814) BP: (97-133)/(68-80) 133/80 (08/17 0814) SpO2:  [91 %-100 %] 100 % (08/17 0407) Weight:  [115.6 kg] 115.6 kg (08/17 0500)  Intake/Output from previous day: 08/16 0701 - 08/17 0700 In: 780 [P.O.:780] Out: 900 [Urine:900]  General appearance: alert, cooperative, and no distress Heart: regular rate and rhythm Lungs: clear to auscultation bilaterally Abdomen: soft, non-tender; bowel sounds normal; no masses,  no organomegaly Extremities: extremities normal, atraumatic, no cyanosis or edema Wound: clean and dry  Lab Results: Recent Labs    02/11/24 0904 02/12/24 0323  WBC 7.2 7.5  HGB 8.7* 8.4*  HCT 28.0* 27.1*  PLT 222 227   BMET:  Recent Labs    02/11/24 0904 02/12/24 0323  NA 136 141  K 3.8 4.0  CL 101 105  CO2 27 29  GLUCOSE 283* 97  BUN 16 14  CREATININE 0.78 0.82  CALCIUM  8.4* 8.7*    PT/INR: No results for input(s): LABPROT, INR in the last 72 hours. ABG    Component Value Date/Time   PHART 7.359 02/07/2024 0448   HCO3 21.8 02/07/2024 0448   TCO2 23 02/07/2024 0448   ACIDBASEDEF 3.0 (H) 02/07/2024 0448   O2SAT 94 02/07/2024 0448   CBG (last 3)  Recent Labs    02/11/24 1636 02/11/24 2146 02/12/24 0613  GLUCAP 221* 111* 100*    Assessment/Plan: S/P Procedure(s) (LRB): PACEMAKER IMPLANT (N/A)  CV- H/O CHB, S/P PPM.. working well.. no BB Pulm- no acute  issues, he is off oxygen, trace left pleural effusion, continue IS Renal-creatinine has been stable, weight is trending down, continue Lasix , potassium for now Dm- sugars stable, will resume home regimen at discharge Dispo- patient doing well, looks great overall, I think he is stable for discharge today, will discuss with Dr. Darrol    LOS: 16 days    Rocky Shad, PA-C 02/12/2024   Chart reviewed, patient examined, agree with above.  He feels well and wants to go home. Incisions look good. Pacer site looks good. Rhythm is stable. Plan home today.

## 2024-02-12 NOTE — Progress Notes (Signed)
 Discharge education provided to the patient, PIV removed, CCMD notified, no DMEs required, patient had no any sutures in chest tube removal sites. Incision sites cleansed and educated about wound care.   Patient was requesting for Gabapentin , he said that it  has been working well for nerve pain, PA notified, and explained to the patient that he needs to reach out to the provider for long term use of this medication as per advised by PA On call.  patient is aware to pick up his TOC medications.

## 2024-02-14 ENCOUNTER — Telehealth (HOSPITAL_COMMUNITY): Payer: Self-pay | Admitting: *Deleted

## 2024-02-14 NOTE — Telephone Encounter (Signed)
 CARDIAC REHAB PHASE I   CRP2 referral sent to [facility]. Will send written educational materials to address on file.   Vaughn Asberry Hacking, RN BSN 02/14/2024 12:52 PM

## 2024-02-17 ENCOUNTER — Ambulatory Visit
Attending: Thoracic Surgery (Cardiothoracic Vascular Surgery) | Admitting: Thoracic Surgery (Cardiothoracic Vascular Surgery)

## 2024-02-17 DIAGNOSIS — Z951 Presence of aortocoronary bypass graft: Secondary | ICD-10-CM

## 2024-02-17 MED ORDER — GABAPENTIN 100 MG PO CAPS: 100.0000 mg | ORAL_CAPSULE | Freq: Two times a day (BID) | ORAL | 0 refills | Status: DC

## 2024-02-17 NOTE — Progress Notes (Signed)
     [address]       [place] [address]             [phone]       Patient: Home Provider: Office Consent for Telemedicine visit obtained.  Today's visit was completed via a real-time telehealth (see specific modality noted below). The patient/authorized person provided oral consent at the time of the visit to engage in a telemedicine encounter with the present provider at [facility]. The patient/authorized person was informed of the potential benefits, limitations, and risks of telemedicine. The patient/authorized person expressed understanding that the laws that protect confidentiality also apply to telemedicine. The patient/authorized person acknowledged understanding that telemedicine does not provide emergency services and that he or she would need to call 911 or proceed to the nearest hospital for help if such a need arose.   Total time spent in the clinical discussion 10 minutes.  Telehealth Modality: Phone visit (audio only)  I had a telephone visit with  [place] JINNY Bars who is s/p CABG, Bentall, Ascending.  Overall doing well.  Pain is minimal.  Ambulating well. Vitals have been stable.  NOLEN LINDAMOOD will see us  back in 1 month with a chest x-ray for cardiac rehab clearance.  Rucha Wissinger MALVA Rayas

## 2024-02-17 NOTE — Addendum Note (Signed)
 Addended by: Chayse Gracey O on: 02/17/2024 02:31 PM   Modules accepted: Orders

## 2024-02-22 ENCOUNTER — Telehealth: Payer: Self-pay

## 2024-02-22 NOTE — Telephone Encounter (Signed)
 Follow-up after same day discharge: Implant date: 02/10/2024 MD: JP Device: PPM SJM  Location: L CHEST    Wound check visit: YES 90 day MD follow-up: YES  Remote Transmission received:YES  Dressing/sling removed: N/A  Confirm OAC restart on: N/A  Please continue to monitor your cardiac device site for redness, swelling, and drainage. Call the device clinic at [phone] if you experience these symptoms, fever/chills, or have questions about your device.   Remote monitoring is used to monitor your cardiac device from home. This monitoring is scheduled every 91 days by our office. It allows us  to keep an eye on the functioning of your device to ensure it is working properly.  UNABLE TO REACH PT LVM FOR PT TO CALL DC BACK

## 2024-02-27 NOTE — Patient Instructions (Incomplete)
 After Your Pacemaker   Monitor your pacemaker site for redness, swelling, and drainage. Call the device clinic at [phone] if you experience these symptoms or fever/chills.  Your incision was closed with Steri-strips or staples:  You may shower 7 days after your procedure and wash your incision with soap and water . Avoid lotions, ointments, or perfumes over your incision until it is well-healed.  You may use a hot tub or a pool after your wound check appointment if the incision is completely closed.  Do not lift, push or pull greater than 10 pounds with the affected arm until 6 weeks after your procedure. UNTIL AFTER SEPTEMBER 26TH.  There are no other restrictions in arm movement after your wound check appointment.  You may drive, unless driving has been restricted by your healthcare providers.  Remote monitoring is used to monitor your pacemaker from home. This monitoring is scheduled every 91 days by our office. It allows us  to keep an eye on the functioning of your device to ensure it is working properly. You will routinely see your Electrophysiologist annually (more often if necessary).     Your physician has recommended you make the following change in your medication:   1) START Xarelto  20 mg: - take 1 tablet by mouth once daily with supper  2) START Amiodarone  200 mg: - take 1 tablet by mouth once daily in the morning    Rivaroxaban  Tablets What is this medication? RIVAROXABAN  (ri va ROX a ban) prevents or treats blood clots. It is also used to lower the risk of stroke in people with AFib (atrial fibrillation). It can be used to lower the risk of heart attack or stroke in people with heart or peripheral artery disease. It belongs to a group of medications called blood thinners. This medicine may be used for other purposes; ask your health care provider or pharmacist if you have questions. COMMON BRAND NAME(S): Xarelto , Xarelto  Starter Pack What should I tell my care  team before I take this medication? They need to know if you have any of these conditions: Antiphospholipid syndrome (APS) Bleeding problems Having surgery, an epidural, a spinal tap, or any other procedure that involves the area around your spine Kidney disease Liver disease Prosthetic heart valve An unusual or allergic reaction to rivaroxaban , other medications, foods, dyes, or preservatives Pregnant or trying to get pregnant Breastfeeding How should I use this medication? Take this medication by mouth. For your therapy to work as well as possible, take each dose exactly as prescribed on the prescription label. Do not skip doses. Skipping doses or stopping this medication can increase your risk of a blood clot or stroke. Keep taking this medication unless your care team tells you to stop. If you are taking this medication after hip or knee replacement surgery, take it with or without food. If you are taking this medication for atrial fibrillation, take it with your evening meal. If you are taking this medication to treat blood clots, take it with food at the same time each day. If you are taking this medication for coronary artery disease or peripheral artery disease, take it with or without food at the same time every day. If you are unable to swallow your tablet, you may crush the tablet and mix it in applesauce. Then, immediately eat the applesauce. You should eat more food right after you eat the applesauce containing the crushed tablet. A special MedGuide will be given to you by the pharmacist with each prescription  and refill. Be sure to read this information carefully each time. Talk to your care team about the use of this medication in children. While it may be prescribed for children as young as newborns for selected conditions, precautions do apply. Overdosage: If you think you have taken too much of this medicine contact a poison control center or emergency room at once. NOTE: This  medicine is only for you. Do not share this medicine with others. What if I miss a dose? If you take your medication once a day and miss a dose, take it as soon as you can. If it is almost time for your next dose, take only that dose. Do not take double or extra doses. If you are taking this medication twice a day to treat blood clots and miss a dose, take the missed dose as soon as you remember. In this instance, 2 tablets may be taken at the same time. The next day you should take 1 tablet twice a day. If you are taking this medication twice a day for coronary artery disease or peripheral artery disease and miss a dose, skip it. Take your next dose at the normal time. Do not take extra or 2 doses at the same time to make up for the missed dose. What may interact with this medication? Do not take this medication with any of the following: Defibrotide This medication may also interact with the following: Aspirin  and aspirin -like medications Certain antibiotics, such as erythromycin  and clarithromycin Certain medications for fungal infections, such as ketoconazole and itraconazole Certain medications for seizures, such as carbamazepine and phenytoin Certain medications that treat or prevent blood clots, such as warfarin, enoxaparin , dalteparin, apixaban, dabigatran, and edoxaban Conivaptan Indinavir Lopinavir; ritonavir NSAIDS, medications for pain and inflammation, such as ibuprofen or naproxen Rifampin Ritonavir SNRIs, medications for depression, such as desvenlafaxine, duloxetine, levomilnacipran, and venlafaxine SSRIs, medications for depression, such as citalopram, escitalopram, fluoxetine, fluvoxamine, paroxetine, and sertraline St. John's wort This list may not describe all possible interactions. Give your health care provider a list of all the medicines, herbs, non-prescription drugs, or dietary supplements you use. Also tell them if you smoke, drink alcohol, or use illegal drugs. Some  items may interact with your medicine. What should I watch for while using this medication? Visit your care team for regular checks on your progress. Tell your care team if your symptoms do not start to get better or if they get worse. You may need blood work done while you are taking this medication. Avoid sports and activities that may cause injury while you are taking this medication. Severe falls or injuries can cause unseen bleeding. Be careful when using sharp tools or knives. Consider using an Neurosurgeon. Take special care brushing or flossing your teeth. Report any injuries, bruising, or red spots on the skin to your care team. Before having surgery, dental work, or another procedure, tell your care team that you are taking this medication. People who take this medication and have a spinal procedure are at risk of forming a blood clot in the space around the brain or spinal cord. This could cause paralysis (not being able to move). The risk is higher in people who have spinal problems or injuries, have had spinal surgery in the past, and for those with a tube (catheter) in their back. Taking other medications that also affect bleeding, such as NSAIDs or other blood thinners, can also increase the risk. Your care team will watch you closely. Let  them know right away if you feel pain, tingling, or numbness in your legs or feet. Wear a medical ID bracelet or chain. Carry a card that describes your condition. List the medications and doses you take on the card. Talk to your care team if you may be pregnant. Serious fetal side effects, such as bleeding, may occur if you take this medication during pregnancy. There are benefits and risks to taking medications during pregnancy. Your care team can help you find the option that works for you. Talk to your care team before breastfeeding. Changes to your treatment plan may be needed. What side effects may I notice from receiving this medication? Side  effects that you should report to your care team as soon as possible: Allergic reactions--skin rash, itching, hives, swelling of the face, lips, tongue, or throat Bleeding--bloody or black, tar-like stools, vomiting blood or brown material that looks like coffee grounds, red or dark brown urine, small red or purple spots on skin, unusual bruising or bleeding Bleeding in the brain--severe headache, stiff neck, confusion, dizziness, change in vision, numbness or weakness of the face, arm, or leg, trouble speaking, trouble walking, vomiting Heavy periods This list may not describe all possible side effects. Call your doctor for medical advice about side effects. You may report side effects to FDA at 1-800-FDA-1088. Where should I keep my medication? Keep out of the reach of children and pets. Store at room temperature between 20 and 25 degrees C (68 and 77 degrees F). Get rid of any unused medication after the expiration date. To get rid of medications that are no longer needed or have expired: Take the medication to a medication take-back program. Check your pharmacy or law enforcement to find a location. If you cannot return the medication, check the label or package insert to see if the medication should be thrown out in the garbage or flushed down the toilet. If you are not sure, ask your care team. If it is safe to put it in the trash, empty the medication out of the container. Mix the medication with cat litter, dirt, coffee grounds, or other unwanted substance. Seal the mixture in a bag or container. Put it in the trash. NOTE: This sheet is a summary. It may not cover all possible information. If you have questions about this medicine, talk to your doctor, pharmacist, or health care provider.  2025 Elsevier/Gold Standard (2023-10-24 00:00:00)    Amiodarone  Tablets What is this medication? AMIODARONE  (a MEE oh da rone) prevents and treats a fast or irregular heartbeat (arrhythmia). It works by  slowing down overactive electric signals in the heart, which stabilizes your heart rhythm. It belongs to a group of medications called antiarrhythmics. This medicine may be used for other purposes; ask your health care provider or pharmacist if you have questions. COMMON BRAND NAME(S): Cordarone , Pacerone  What should I tell my care team before I take this medication? They need to know if you have any of these conditions: Liver disease Lung disease Other heart problems Thyroid  disease An unusual or allergic reaction to amiodarone , iodine, other medications, foods, dyes, or preservatives Pregnant or trying to get pregnant Breast-feeding How should I use this medication? Take this medication by mouth with water . Take it as directed on the prescription label at the same time every day. You can take it with or without food. You should always take it the same way. Keep taking it unless your care team tells you to stop. A special MedGuide will  be given to you by the pharmacist with each prescription and refill. Be sure to read this information carefully each time. Talk to your care team about the use of this medication in children. Special care may be needed. Overdosage: If you think you have taken too much of this medicine contact a poison control center or emergency room at once. NOTE: This medicine is only for you. Do not share this medicine with others. What if I miss a dose? If you miss a dose, take it as soon as you can. If it is almost time for your next dose, take only that dose. Do not take double or extra doses. What may interact with this medication? Do not take this medication with any of the following: Abarelix Apomorphine Arsenic trioxide Certain antibiotics, such as erythromycin , gemifloxacin, levofloxacin , or pentamidine Certain medications for depression, such as amoxapine or tricyclic antidepressants Certain medications for fungal infections, such as fluconazole, itraconazole,  ketoconazole, posaconazole, or voriconazole Certain medications for irregular heartbeat, such as disopyramide, dronedarone, ibutilide, propafenone, or sotalol Certain medications for malaria, such as chloroquine or halofantrine Cisapride Droperidol Haloperidol Hawthorn Maprotiline Methadone Phenothiazines, such as chlorpromazine, mesoridazine, or thioridazine Pimozide Ranolazine Red yeast rice Vardenafil This medication may also interact with the following: Antivirals for HIV Certain medications for blood pressure, heart disease, irregular heartbeat Certain medications for cholesterol, such as atorvastatin , cerivastatin, lovastatin, or simvastatin Certain medications for hepatitis C, such as sofosbuvir and ledipasvir; sofosbuvir Certain medications for seizures, such as phenytoin Certain medications for thyroid  problems Certain medications that prevent or treat blood clots, such as warfarin Cholestyramine Cimetidine Clopidogrel Cyclosporine Dextromethorphan Diuretics Dofetilide Fentanyl  General anesthetics Grapefruit juice Lidocaine  Loratadine  Methotrexate Other medications that cause heart rhythm changes Procainamide Quinidine Rifabutin, rifampin, or rifapentine St. John's Wort Trazodone Ziprasidone This list may not describe all possible interactions. Give your health care provider a list of all the medicines, herbs, non-prescription drugs, or dietary supplements you use. Also tell them if you smoke, drink alcohol, or use illegal drugs. Some items may interact with your medicine. What should I watch for while using this medication? Your condition will be monitored closely when you first begin therapy. This medication is often started in a hospital or other monitored health care setting. Once you are on maintenance therapy, visit your care team for regular checks on your progress. Because your condition and use of this medication carry some risk, it is a good idea to  carry an identification card, necklace, or bracelet with details of your condition, medications, and care team. This medication may affect your coordination, reaction time, or judgment. Do not drive or operate machinery until you know how this medication affects you. Sit up or stand slowly to reduce the risk of dizzy or fainting spells. Drinking alcohol with this medication can increase the risk of these side effects. This medication can make you more sensitive to the sun. Keep out of the sun. If you cannot avoid being in the sun, wear protective clothing and sunscreen. Do not use sun lamps, tanning beds, or tanning booths. You should have regular eye exams before and during treatment. Call your care team if you have blurred vision, see halos, or your eyes become sensitive to light. Your eyes may get dry. It may be helpful to use a lubricating eye solution or artificial tears solution. If you are going to have surgery or a procedure that requires contrast dyes, tell your care team that you are taking this medication. What side effects may I  notice from receiving this medication? Side effects that you should report to your care team as soon as possible: Allergic reactions--skin rash, itching, hives, swelling of the face, lips, tongue, or throat Bluish-gray skin Change in vision such as blurry vision, seeing halos around lights, vision loss Heart failure--shortness of breath, swelling of the ankles, feet, or hands, sudden weight gain, unusual weakness or fatigue Heart rhythm changes--fast or irregular heartbeat, dizziness, feeling faint or lightheaded, chest pain, trouble breathing High thyroid  levels (hyperthyroidism)--fast or irregular heartbeat, weight loss, excessive sweating or sensitivity to heat, tremors or shaking, anxiety, nervousness, irregular menstrual cycle or spotting Liver injury--right upper belly pain, loss of appetite, nausea, light-colored stool, dark yellow or brown urine, yellowing  skin or eyes, unusual weakness or fatigue Low thyroid  levels (hypothyroidism)--unusual weakness or fatigue, sensitivity to cold, constipation, hair loss, dry skin, weight gain, feelings of depression Lung injury--shortness of breath or trouble breathing, cough, spitting up blood, chest pain, fever Pain, tingling, or numbness in the hands or feet, muscle weakness, trouble walking, loss of balance or coordination Side effects that usually do not require medical attention (report to your care team if they continue or are bothersome): Nausea Vomiting This list may not describe all possible side effects. Call your doctor for medical advice about side effects. You may report side effects to FDA at 1-800-FDA-1088. Where should I keep my medication? Keep out of the reach of children and pets. Store at room temperature between 20 and 25 degrees C (68 and 77 degrees F). Protect from light. Keep container tightly closed. Throw away any unused medication after the expiration date. NOTE: This sheet is a summary. It may not cover all possible information. If you have questions about this medicine, talk to your doctor, pharmacist, or health care provider.  2024 Elsevier/Gold Standard (2022-01-01 00:00:00)

## 2024-02-28 ENCOUNTER — Ambulatory Visit: Attending: Cardiology

## 2024-02-28 ENCOUNTER — Other Ambulatory Visit (HOSPITAL_COMMUNITY): Payer: Self-pay

## 2024-02-28 DIAGNOSIS — I442 Atrioventricular block, complete: Secondary | ICD-10-CM | POA: Diagnosis not present

## 2024-02-28 MED ORDER — AMIODARONE HCL 200 MG PO TABS
200.0000 mg | ORAL_TABLET | Freq: Every day | ORAL | 0 refills | Status: DC
  Filled 2024-02-28: qty 30, 30d supply, fill #0
  Filled 2024-03-30: qty 30, 30d supply, fill #1

## 2024-02-28 MED ORDER — RIVAROXABAN 20 MG PO TABS
20.0000 mg | ORAL_TABLET | Freq: Every day | ORAL | 0 refills | Status: DC
  Filled 2024-02-28: qty 30, 30d supply, fill #0
  Filled 2024-03-30: qty 30, 30d supply, fill #1

## 2024-02-29 ENCOUNTER — Encounter: Payer: Self-pay | Admitting: *Deleted

## 2024-02-29 ENCOUNTER — Ambulatory Visit: Payer: Self-pay | Admitting: Cardiology

## 2024-02-29 LAB — CUP PACEART INCLINIC DEVICE CHECK
Battery Remaining Longevity: 123 mo
Battery Voltage: 3.05 V
Brady Statistic RA Percent Paced: 21 %
Brady Statistic RV Percent Paced: 99.47 %
Date Time Interrogation Session: 20250902160300
Implantable Lead Connection Status: 753985
Implantable Lead Connection Status: 753985
Implantable Lead Implant Date: 20250815
Implantable Lead Implant Date: 20250815
Implantable Lead Location: 753859
Implantable Lead Location: 753860
Implantable Pulse Generator Implant Date: 20250815
Lead Channel Impedance Value: 425 Ohm
Lead Channel Impedance Value: 550 Ohm
Lead Channel Pacing Threshold Amplitude: 0.625 V
Lead Channel Pacing Threshold Amplitude: 1.25 V
Lead Channel Pacing Threshold Pulse Width: 0.5 ms
Lead Channel Pacing Threshold Pulse Width: 0.5 ms
Lead Channel Sensing Intrinsic Amplitude: 1.9 mV
Lead Channel Sensing Intrinsic Amplitude: 12 mV
Lead Channel Setting Pacing Amplitude: 0.875
Lead Channel Setting Pacing Amplitude: 2.25 V
Lead Channel Setting Pacing Pulse Width: 0.5 ms
Lead Channel Setting Sensing Sensitivity: 4 mV
Pulse Gen Model: 2272
Pulse Gen Serial Number: 8296661

## 2024-02-29 NOTE — Progress Notes (Signed)
 Normal in-clinic dual chamber pacemaker check. Presenting Rhythm: AS/VP- 87. Routine testing of thresholds, sensing, and impedance demonstrate stable parameters and no programming changes needed at this time. 21 AMS episodes c/w brief burst of new onset AF. Total duration of all events about 15 hours. Longest episode lasting ~ 6.5 hours. Estimated longevity 9-10 years. Pt enrolled in remote follow-up.  Patient reports symptoms of increased SOB that may correlate with these events. Reviewed with Dr. Kennyth in office. Patient started on Amiodarone  200 mg once daily and Xarelto  20 mg once daily. Will continue to monitor. Patient advised to call the office if having symptoms.

## 2024-03-07 NOTE — Patient Instructions (Signed)
 Walk 5 minutes of every hour during the day. 10-81mmHg compression socks knee length daily; remove at night. Increase to 15-20mmHg strength as tolerated.

## 2024-03-07 NOTE — Progress Notes (Signed)
 Reason for Consult Patient seen in consultation at the request of Tully Gosling. Notes reviewed from 02-22-2024.  History Of Present Illness History of Present Illness The patient is a male who presents for evaluation of foot swelling and discoloration of his lower calves.SABRA  He experienced significant foot swelling leading to the formation of two large blisters prior to his CABG surgery while receiving fluids in the hospital. He was admitted due to chest pain and atrial fibrillation, which led to the discovery of four blockages.  He underwent 4 vessel bypass.  The swelling has been present for some time but is improving. He has noticed dark spots on his toes, which were previously solid black. He has not yet started cardiac rehabilitation and is managing his diabetes. He has been on Dilaudid  for about 8 years, but it did not alleviate his pain. Fentanyl  was also ineffective. Gabapentin , prescribed three times a day, helped him sleep better. He is currently not on any blood thinners but continues to take aspirin .  If he was removed from his leg for heart surgery. He has a history of DVT in his right leg, treated with Coumadin for a month more than 10 years ago. Compression socks worn in the past did not provide relief. He also experiences numbness in his feet due to back issues.  He has had both hips replaced and has seven disc ruptures in his lower back.  PAST SURGICAL HISTORY: CABG, bilateral hip replacements.  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 Exercise: Walks frequently during the day, aiming for 5 minutes of walking every hour.   Past Medical History He has a past medical history of Bell's palsy, Diabetes mellitus    (CMD), Disease of thyroid  gland, Fusion of spine of cervical region, HL (hearing loss), Hypertension, Ruptured cervical disc, Ruptured lumbar disc, Ruptured thoracic disc, and Sleep apnea.  He has no past medical  history of Lupus.  Surgical History He has a past surgical history that includes Mouth surgery; Neck surgery; Total hip arthroplasty (Left, 2019); Wrist surgery (Left); Total hip arthroplasty (Right, 04/13/2023); and Cardiac surgery.   Social History He reports that he has quit smoking. His smoking use included cigarettes. He has never used smokeless tobacco. He reports that he does not drink alcohol and does not use drugs.   Allergies Cefdinir   Medications Prescriptions Prior to Admission[1]  Review of Systems As above per HPI, remaining systems reviewed are negative.   Physical Exam Physical Exam Cardiovascular: Bilateral pedal pulses are normal; Pasik by hand-held Doppler DP. Skin: Edema at ankles with mild hyperpigmentation lipodermatosclerosis from the ankles to mid calfs.  No open ulcers.  Hyperkeratotic scale in the distal ca   Last Recorded Vitals There were no vitals taken for this visit.  Relevant Results Results Diagnostic Testing  - Doppler ultrasound of leg arteries: Normal pulses, no blockages in leg arteries   Assessment/Plan  Assessment & Plan 1. Venous insufficiency, factorial in origin. The condition is likely due to prolonged sitting during his trucking career, leading to leaky valves in the veins. . The degree of DVT and the removal of the GSV could exacerbate the condition. However, pulses are strong, indicating good blood flow to the feet. Neuropathy and back issues contribute to this condition, but it is not consistent with vasculitis. The pressure from the swelling compresses the small arteries and blood vessels, causing the symptoms. Once the swelling subsides, the condition should improve. Elevation of the legs while sitting  and walking for 5 minutes every hour were advised. The use of compression socks with a tennis shoe was recommended, starting with a low strength of 10 to 15 mmHg and increasing to 15 to 20 mmHg as tolerated. If these are effective,  gradually increasing the strength is suggested. The area should be kept clean and covered, using soap and water  for cleaning. If the area becomes wet or macerated, gauze can be placed between the toes. If the current treatment plan proves ineffective, an ultrasound and vein treatment may be considered.  2. Neuropathy due to lower back disease with impairment of venoarterial reflex contributing to edema. The patient experiences burning nerve pain, which was effectively managed with gabapentin . Gabapentin  should be continued as prescribed, three times a day. The neuropathy affects balance, which should be considered when choosing footwear to prevent falls.   3. Diabetes mellitus. Diabetes management is ongoing. The condition contributes to neuropathy and venous insufficiency. Continued monitoring and management of blood glucose levels are essential to prevent further complications.   4. Status post bilateral hip replacement. Both hips have been replaced, and the patient has seven ruptures in the lower back. These conditions make it difficult to put on compression socks. The patient should focus on cardiac rehab and maintaining mobility to aid in overall health and venous insufficiency management.   5.  Post CABG.  Patient will begin cardiac rehab soon.  Patient acknowledges plan and agrees and will return as needed.       [1] (Not in a hospital admission)

## 2024-03-19 ENCOUNTER — Telehealth (HOSPITAL_COMMUNITY): Payer: Self-pay

## 2024-03-19 NOTE — Telephone Encounter (Signed)
 MD order received from Dr. Billy office.

## 2024-03-22 ENCOUNTER — Other Ambulatory Visit: Payer: Self-pay | Admitting: Thoracic Surgery (Cardiothoracic Vascular Surgery)

## 2024-03-22 DIAGNOSIS — Z951 Presence of aortocoronary bypass graft: Secondary | ICD-10-CM

## 2024-03-23 ENCOUNTER — Ambulatory Visit: Payer: PRIVATE HEALTH INSURANCE

## 2024-03-23 ENCOUNTER — Ambulatory Visit (HOSPITAL_BASED_OUTPATIENT_CLINIC_OR_DEPARTMENT_OTHER)
Admission: RE | Admit: 2024-03-23 | Discharge: 2024-03-23 | Disposition: A | Discharge status: Home / Self Care | Payer: PRIVATE HEALTH INSURANCE | Source: Ambulatory Visit | Attending: Thoracic Surgery (Cardiothoracic Vascular Surgery) | Admitting: Thoracic Surgery (Cardiothoracic Vascular Surgery)

## 2024-03-23 ENCOUNTER — Other Ambulatory Visit: Payer: Self-pay | Admitting: Thoracic Surgery (Cardiothoracic Vascular Surgery)

## 2024-03-23 ENCOUNTER — Ambulatory Visit (HOSPITAL_COMMUNITY)
Admission: RE | Admit: 2024-03-23 | Discharge: 2024-03-23 | Disposition: A | Discharge status: Home / Self Care | Payer: PRIVATE HEALTH INSURANCE | Source: Ambulatory Visit | Attending: Internal Medicine | Admitting: Internal Medicine

## 2024-03-23 VITALS — BP 119/78 | HR 94 | Resp 18 | Ht 73.0 in | Wt 239.0 lb

## 2024-03-23 DIAGNOSIS — Z951 Presence of aortocoronary bypass graft: Secondary | ICD-10-CM | POA: Insufficient documentation

## 2024-03-23 DIAGNOSIS — Z952 Presence of prosthetic heart valve: Secondary | ICD-10-CM

## 2024-03-23 LAB — ECHOCARDIOGRAM COMPLETE
AR max vel: 1.91 cm2
AV Area VTI: 2.06 cm2
AV Area mean vel: 2.08 cm2
AV Mean grad: 6.8 mmHg
AV Peak grad: 12.9 mmHg
Ao pk vel: 1.8 m/s
Area-P 1/2: 3.43 cm2
Calc EF: 62.8 %
Height: 73 in
S' Lateral: 3.2 cm
Single Plane A2C EF: 63.3 %
Single Plane A4C EF: 63.7 %
Weight: 3824 [oz_av]

## 2024-03-23 MED ORDER — PERFLUTREN LIPID MICROSPHERE
1.0000 mL | INTRAVENOUS | Status: AC | PRN
  Administered 2024-03-23: 2 mL via INTRAVENOUS

## 2024-03-23 NOTE — H&P (View-Only) (Signed)
 [address] [place] [address]             [phone]       HPI:  Patient returns for routine postoperative follow-up having undergone CABG X 3.  LIMA LAD, RSVG PDA, diagonal, Bentall Procedure with 29mm Konect Resilia, Right and left coronary re-implantation. Endoscopic greater saphenous vein harvest on the right, Right axillary artery cannulation with 8 mm graft, Antegrade cerebral perfusion, Ascending aortic aneurysm/Hemiarch replacement with 30mm graft on 02/06/2024 with Dr. Shyrl.  The patient's early postoperative recovery while in the hospital was notable for being paced since he developed complete heart block. Electrophysiology was consulted and he had a permanent pacemaker implanted on 08/15. He was started on midodrine  for blood pressure support.  He was stable for discharge on 02/12/2024 .   He presents to the clinic today with his wife. He report that since surgery he has been having chest pain.  He describes the chest pain as heavy pressure.  He has been using nitroglycerin  almost daily which alleviates the pain.  He does not feel that it is incisional pain. His pacemaker showed that he was having atrial fibrillation and he was started on xarelto  and amiodarone .  He has been feeling tired and weak. He has been having headaches which he attributes to his blood sugar.  He has a dexcom but is not currently using it. He denies tachycardia, shortness of breath and lower leg swelling.    Allergies as of 03/23/2024       Reactions   Omnicef  [cefdinir ] Rash        Medication List        Accurate as of March 23, 2024 12:46 PM. If you have any questions, ask your nurse or doctor.          STOP taking these medications    furosemide  40 MG tablet Commonly known as: LASIX  Stopped by: Manuelita CHRISTELLA Rough   midodrine  10 MG tablet Commonly known as: PROAMATINE  Stopped by: Manuelita CHRISTELLA Rough   potassium chloride  SA 20 MEQ tablet Commonly  known as: KLOR-CON  M Stopped by: Manuelita CHRISTELLA Rough   triamcinolone  cream 0.1 % Commonly known as: KENALOG  Stopped by: Manuelita CHRISTELLA Rough       TAKE these medications    amiodarone  200 MG tablet Commonly known as: PACERONE  Take 1 tablet (200 mg total) by mouth daily.   amLODipine  10 MG tablet Commonly known as: NORVASC  Take 10 mg by mouth daily.   aspirin  EC 81 MG tablet TAKE 1 TABLET DAILY.   atorvastatin  40 MG tablet Commonly known as: LIPITOR Take 1 tablet (40 mg total) by mouth daily.   dapagliflozin  propanediol 10 MG Tabs tablet Commonly known as: Farxiga  Take 1 tablet (10 mg total) by mouth daily.   Dexcom G7 Receiver Devi Use as directed with compatible sensors to monitor blood sugar levels.   esomeprazole  40 MG capsule Commonly known as: NEXIUM  Take 1 capsule (40 mg total) by mouth daily.   gabapentin  300 MG capsule Commonly known as: NEURONTIN  Take 300 mg by mouth. What changed: Another medication with the same name was removed. Continue taking this medication, and follow the directions you see here. Changed by: Manuelita CHRISTELLA Rough   guaiFENesin  600 MG 12 hr tablet Commonly known as: MUCINEX  Take 1 tablet (600 mg total) by mouth 2 (two) times daily as needed.   HYDROmorphone  4 MG tablet Commonly known as: DILAUDID  Take  4 mg by mouth every 4 (four) hours as needed for severe pain (pain score 7-10).   levothyroxine  200 MCG tablet Commonly known as: SYNTHROID  Take 200 mcg by mouth. What changed: Another medication with the same name was removed. Continue taking this medication, and follow the directions you see here. Changed by: Manuelita CHRISTELLA Rough   metFORMIN  500 MG 24 hr tablet Commonly known as: GLUCOPHAGE -XR Take 2 tablets (1,000 mg total) by mouth 2 (two) times daily with a meal.   Misc. Devices Misc Dispense CPAP mask, tank, and tubing for patient use nightly.   nitroGLYCERIN  0.4 MG SL tablet Commonly known as: NITROSTAT  Place 1 tablet (0.4 mg  total) under the tongue every 5 (five) minutes as needed for chest pain.   ramipril  10 MG capsule Commonly known as: ALTACE  Take 10 mg by mouth daily.   Xarelto  20 MG Tabs tablet Generic drug: rivaroxaban  Take 1 tablet (20 mg total) by mouth daily with supper.         ROS  Review of Systems  Constitutional:  Positive for malaise/fatigue. Negative for fever.  Respiratory: Negative.  Negative for cough and shortness of breath.   Cardiovascular:  Positive for chest pain. Negative for leg swelling.  Neurological:  Positive for headaches.     BP 119/78 (BP Location: Right Arm)   Pulse 94   Resp 18   Ht 6' 1 (1.854 m)   Wt 239 lb (108.4 kg)   SpO2 96%   BMI 31.53 kg/m   Physical Exam Constitutional:      Appearance: Normal appearance.  HENT:     Head: Normocephalic and atraumatic.  Cardiovascular:     Rate and Rhythm: Normal rate and regular rhythm.     Heart sounds: Normal heart sounds, S1 normal and S2 normal.  Pulmonary:     Effort: Pulmonary effort is normal.     Breath sounds: Normal breath sounds.  Skin:    General: Skin is warm and dry.  Neurological:     General: No focal deficit present.     Mental Status: He is alert and oriented to person, place, and time.       Imaging: CLINICAL DATA:  Post CABG.   EXAM: CHEST - 2 VIEW   COMPARISON:  02/11/2024   FINDINGS: Sternotomy wires, prosthetic aortic valve and left-sided pacemaker unchanged. Lungs are adequately inflated with improved left base opacification compatible with small residual effusion with associated atelectasis. Right lung is clear. Cardiomediastinal silhouette and remainder of the exam is unchanged.   IMPRESSION: Improved left base opacification compatible with small residual effusion with associated atelectasis.     Electronically Signed   By: Toribio Agreste M.D.   On: 03/23/2024 11:40       ECHOCARDIOGRAM REPORT       Patient Name:   Colin Ramirez Date of Exam:  03/23/2024 Medical Rec #:  [id number]         Height:       73.0 in Accession #:    [id number]        Weight:       239.0 lb Date of Birth:  Jul 19, 1965         BSA:          2.321 m Patient Age:    58 years          BP:           119/78 mmHg Patient Gender: M  HR:           77 bpm. Exam Location:  Inpatient  Procedure: 2D Echo and Intracardiac Opacification Agent (Both Spectral and Color            Flow Doppler were utilized during procedure).  Indications:    S/P cabg and AVR   History:        Patient has prior history of Echocardiogram examinations. CAD;                 Aortic Valve Disease.                 Aortic Valve: BENTALL PROCEDURE AND HEMIARCH USING                 HEMASHIELD PLATINUM VASCULAR GRAFT AND KONECT RESILIA                 AORTIC VALVE CONDUIT valve is present in the aortic position.                 Procedure Date: 02/06/24.   Sonographer:    Norleen Amour Referring Phys: [id number] HARRELL O LIGHTFOOT  IMPRESSIONS    1. Left ventricular ejection fraction, by estimation, is 60 to 65%. The left ventricle has normal function. The left ventricle has no regional wall motion abnormalities. There is mild left ventricular hypertrophy.  2. Right ventricular systolic function is normal. The right ventricular size is mildly enlarged.  3. A small pericardial effusion is present. There is no evidence of cardiac tamponade.  4. The mitral valve is normal in structure. No evidence of mitral valve regurgitation. No evidence of mitral stenosis.  5. The aortic valve has been repaired/replaced. Aortic valve regurgitation is not visualized. No aortic stenosis is present. There is a BENTALL PROCEDURE AND HEMIARCH USING HEMASHIELD PLATINUM VASCULAR GRAFT AND KONECT RESILIA AORTIC VALVE CONDUIT valve present in the aortic position. Procedure Date: 02/06/24. Echo findings are consistent with normal structure and function of the aortic valve  prosthesis. Aortic valve mean gradient measures 6.8 mmHg. Aortic valve Vmax measures 1.80 m/s.  6. Aortic dilatation noted. There is mild dilatation of the ascending aorta, measuring 41 mm.  7. The inferior vena cava is normal in size with greater than 50% respiratory variability, suggesting right atrial pressure of 3 mmHg.  FINDINGS  Left Ventricle: Left ventricular ejection fraction, by estimation, is 60 to 65%. The left ventricle has normal function. The left ventricle has no regional wall motion abnormalities. Definity  contrast agent was given IV to delineate the left ventricular  endocardial borders. The left ventricular internal cavity size was normal in size. There is mild left ventricular hypertrophy.  Right Ventricle: The right ventricular size is mildly enlarged. No increase in right ventricular wall thickness. Right ventricular systolic function is normal.  Left Atrium: Left atrial size was normal in size.  Right Atrium: Right atrial size was normal in size.  Pericardium: A small pericardial effusion is present. There is no evidence of cardiac tamponade.  Mitral Valve: The mitral valve is normal in structure. Mild mitral annular calcification. No evidence of mitral valve regurgitation. No evidence of mitral valve stenosis.  Tricuspid Valve: The tricuspid valve is normal in structure. Tricuspid valve regurgitation is not demonstrated. No evidence of tricuspid stenosis.  Aortic Valve: The aortic valve has been repaired/replaced. Aortic valve regurgitation is not visualized. No aortic stenosis is present. Aortic valve mean gradient measures 6.8 mmHg. Aortic valve peak gradient measures 12.9 mmHg.  Aortic valve area, by VTI  measures 2.06 cm. There is a BENTALL PROCEDURE AND HEMIARCH USING HEMASHIELD PLATINUM VASCULAR GRAFT AND KONECT RESILIA AORTIC VALVE CONDUIT valve present in the aortic position. Procedure Date: 02/06/24. Echo findings are consistent  with normal structure and function of the aortic valve prosthesis.  Pulmonic Valve: The pulmonic valve was normal in structure. Pulmonic valve regurgitation is not visualized. No evidence of pulmonic stenosis.  Aorta: Aortic dilatation noted. There is mild dilatation of the ascending aorta, measuring 41 mm.  Venous: The inferior vena cava is normal in size with greater than 50% respiratory variability, suggesting right atrial pressure of 3 mmHg.  IAS/Shunts: No atrial level shunt detected by color flow Doppler.    LEFT VENTRICLE PLAX 2D LVIDd:         4.70 cm      Diastology LVIDs:         3.20 cm      LV e' medial:    6.31 cm/s LV PW:         1.50 cm      LV E/e' medial:  9.6 LV IVS:        1.40 cm      LV e' lateral:   7.51 cm/s LVOT diam:     2.40 cm      LV E/e' lateral: 8.0 LV SV:         72 LV SV Index:   31 LVOT Area:     4.52 cm   LV Volumes (MOD) LV vol d, MOD A2C: 67.9 ml LV vol d, MOD A4C: 167.0 ml LV vol s, MOD A2C: 24.9 ml LV vol s, MOD A4C: 60.6 ml LV SV MOD A2C:     43.0 ml LV SV MOD A4C:     167.0 ml LV SV MOD BP:      68.0 ml  RIGHT VENTRICLE            IVC RV Basal diam:  4.70 cm    IVC diam: 2.20 cm RV S prime:     5.07 cm/s TAPSE (M-mode): 1.6 cm  LEFT ATRIUM              Index        RIGHT ATRIUM           Index LA diam:        4.10 cm  1.77 cm/m   RA Area:     26.20 cm LA Vol (A2C):   45.0 ml  19.39 ml/m  RA Volume:   99.80 ml  43.00 ml/m LA Vol (A4C):   108.0 ml 46.53 ml/m LA Biplane Vol: 73.1 ml  31.50 ml/m  AORTIC VALVE AV Area (Vmax):    1.91 cm AV Area (Vmean):   2.08 cm AV Area (VTI):     2.06 cm AV Vmax:           179.80 cm/s AV Vmean:          123.400 cm/s AV VTI:            0.352 m AV Peak Grad:      12.9 mmHg AV Mean Grad:      6.8 mmHg LVOT Vmax:         76.07 cm/s LVOT Vmean:        56.733 cm/s LVOT VTI:          0.160 m LVOT/AV VTI ratio: 0.45   AORTA Ao Root diam: 4.10 cm Ao  Asc diam:  3.00 cm  MITRAL  VALVE MV Area (PHT): 3.43 cm    SHUNTS MV Decel Time: 221 msec    Systemic VTI:  0.16 m MV E velocity: 60.30 cm/s  Systemic Diam: 2.40 cm MV A velocity: 73.50 cm/s MV E/A ratio:  0.82  Oneil Parchment MD Electronically signed by Oneil Parchment MD Signature Date/Time: 03/23/2024/1:49:21 PM       Final      Assessment/Plan: S/P CABG x 3 -We reviewed today's chest x ray.  -Echocardiogram showed small pericardial effusion, Left ventricular ejection fraction is 60 to 65%, The aortic valve has been repaired/replaced. Aortic valve regurgitation is not visualized. No aortic stenosis is present. There is a BENTALL PROCEDURE AND HEMIARCH USING HEMASHIELD PLATINUM VASCULAR GRAFT AND KONECT RESILIA AORTIC VALVE CONDUIT valve present in the aortic position. Procedure Date: 02/06/24. Echo findings are consistent with normal structure and function of the aortic valve prosthesis. Aortic valve mean gradient measures 6.8 mmHg. Aortic valve Vmax measures 1.80 m/s.  -He is scheduled for a heart catheterization on 03/26/2024 at 9 am.  He is to arrive at 7 am.  He is to hold xarelto  for 48 hours prior to procedure. Called and gave patient instructions and he states understanding -Alarm symptoms discussed and he is to be evaluated if he starts to have worsening chest pain that is not alleviated with nitroglycerin , shortness of breath, lower leg edema, dizziness  -He has not been seen by cardiology office but has had discussions with pacemaker team.  He was started on amiodarone  and xarelto  on 02/28/2024 -PCP stopped his midodrine  due to having not having hypotension -Follow up in one week with [facility]  Manuelita CHRISTELLA Rough, PA-C 12:46 PM 03/23/24

## 2024-03-23 NOTE — Patient Instructions (Signed)
-  Echocardiogram today at 2 pm -Follow in one week at our office with chest xray

## 2024-03-23 NOTE — Progress Notes (Addendum)
 [address] [place] [address]             [phone]       HPI:  Patient returns for routine postoperative follow-up having undergone CABG X 3.  LIMA LAD, RSVG PDA, diagonal, Bentall Procedure with 29mm Konect Resilia, Right and left coronary re-implantation. Endoscopic greater saphenous vein harvest on the right, Right axillary artery cannulation with 8 mm graft, Antegrade cerebral perfusion, Ascending aortic aneurysm/Hemiarch replacement with 30mm graft on 02/06/2024 with Dr. Shyrl.  The patient's early postoperative recovery while in the hospital was notable for being paced since he developed complete heart block. Electrophysiology was consulted and he had a permanent pacemaker implanted on 08/15. He was started on midodrine  for blood pressure support.  He was stable for discharge on 02/12/2024 .   He presents to the clinic today with his wife. He report that since surgery he has been having chest pain.  He describes the chest pain as heavy pressure.  He has been using nitroglycerin  almost daily which alleviates the pain.  He does not feel that it is incisional pain. His pacemaker showed that he was having atrial fibrillation and he was started on xarelto  and amiodarone .  He has been feeling tired and weak. He has been having headaches which he attributes to his blood sugar.  He has a dexcom but is not currently using it. He denies tachycardia, shortness of breath and lower leg swelling.    Allergies as of 03/23/2024       Reactions   Omnicef  [cefdinir ] Rash        Medication List        Accurate as of March 23, 2024 12:46 PM. If you have any questions, ask your nurse or doctor.          STOP taking these medications    furosemide  40 MG tablet Commonly known as: LASIX  Stopped by: Manuelita CHRISTELLA Rough   midodrine  10 MG tablet Commonly known as: PROAMATINE  Stopped by: Manuelita CHRISTELLA Rough   potassium chloride  SA 20 MEQ tablet Commonly  known as: KLOR-CON  M Stopped by: Manuelita CHRISTELLA Rough   triamcinolone  cream 0.1 % Commonly known as: KENALOG  Stopped by: Manuelita CHRISTELLA Rough       TAKE these medications    amiodarone  200 MG tablet Commonly known as: PACERONE  Take 1 tablet (200 mg total) by mouth daily.   amLODipine  10 MG tablet Commonly known as: NORVASC  Take 10 mg by mouth daily.   aspirin  EC 81 MG tablet TAKE 1 TABLET DAILY.   atorvastatin  40 MG tablet Commonly known as: LIPITOR Take 1 tablet (40 mg total) by mouth daily.   dapagliflozin  propanediol 10 MG Tabs tablet Commonly known as: Farxiga  Take 1 tablet (10 mg total) by mouth daily.   Dexcom G7 Receiver Devi Use as directed with compatible sensors to monitor blood sugar levels.   esomeprazole  40 MG capsule Commonly known as: NEXIUM  Take 1 capsule (40 mg total) by mouth daily.   gabapentin  300 MG capsule Commonly known as: NEURONTIN  Take 300 mg by mouth. What changed: Another medication with the same name was removed. Continue taking this medication, and follow the directions you see here. Changed by: Manuelita CHRISTELLA Rough   guaiFENesin  600 MG 12 hr tablet Commonly known as: MUCINEX  Take 1 tablet (600 mg total) by mouth 2 (two) times daily as needed.   HYDROmorphone  4 MG tablet Commonly known as: DILAUDID  Take  4 mg by mouth every 4 (four) hours as needed for severe pain (pain score 7-10).   levothyroxine  200 MCG tablet Commonly known as: SYNTHROID  Take 200 mcg by mouth. What changed: Another medication with the same name was removed. Continue taking this medication, and follow the directions you see here. Changed by: Manuelita CHRISTELLA Rough   metFORMIN  500 MG 24 hr tablet Commonly known as: GLUCOPHAGE -XR Take 2 tablets (1,000 mg total) by mouth 2 (two) times daily with a meal.   Misc. Devices Misc Dispense CPAP mask, tank, and tubing for patient use nightly.   nitroGLYCERIN  0.4 MG SL tablet Commonly known as: NITROSTAT  Place 1 tablet (0.4 mg  total) under the tongue every 5 (five) minutes as needed for chest pain.   ramipril  10 MG capsule Commonly known as: ALTACE  Take 10 mg by mouth daily.   Xarelto  20 MG Tabs tablet Generic drug: rivaroxaban  Take 1 tablet (20 mg total) by mouth daily with supper.         ROS  Review of Systems  Constitutional:  Positive for malaise/fatigue. Negative for fever.  Respiratory: Negative.  Negative for cough and shortness of breath.   Cardiovascular:  Positive for chest pain. Negative for leg swelling.  Neurological:  Positive for headaches.     BP 119/78 (BP Location: Right Arm)   Pulse 94   Resp 18   Ht 6' 1 (1.854 m)   Wt 239 lb (108.4 kg)   SpO2 96%   BMI 31.53 kg/m   Physical Exam Constitutional:      Appearance: Normal appearance.  HENT:     Head: Normocephalic and atraumatic.  Cardiovascular:     Rate and Rhythm: Normal rate and regular rhythm.     Heart sounds: Normal heart sounds, S1 normal and S2 normal.  Pulmonary:     Effort: Pulmonary effort is normal.     Breath sounds: Normal breath sounds.  Skin:    General: Skin is warm and dry.  Neurological:     General: No focal deficit present.     Mental Status: He is alert and oriented to person, place, and time.       Imaging: CLINICAL DATA:  Post CABG.   EXAM: CHEST - 2 VIEW   COMPARISON:  02/11/2024   FINDINGS: Sternotomy wires, prosthetic aortic valve and left-sided pacemaker unchanged. Lungs are adequately inflated with improved left base opacification compatible with small residual effusion with associated atelectasis. Right lung is clear. Cardiomediastinal silhouette and remainder of the exam is unchanged.   IMPRESSION: Improved left base opacification compatible with small residual effusion with associated atelectasis.     Electronically Signed   By: Toribio Agreste M.D.   On: 03/23/2024 11:40       ECHOCARDIOGRAM REPORT       Patient Name:   Colin Ramirez Date of Exam:  03/23/2024 Medical Rec #:  [id number]         Height:       73.0 in Accession #:    [id number]        Weight:       239.0 lb Date of Birth:  Jul 19, 1965         BSA:          2.321 m Patient Age:    58 years          BP:           119/78 mmHg Patient Gender: M  HR:           77 bpm. Exam Location:  Inpatient  Procedure: 2D Echo and Intracardiac Opacification Agent (Both Spectral and Color            Flow Doppler were utilized during procedure).  Indications:    S/P cabg and AVR   History:        Patient has prior history of Echocardiogram examinations. CAD;                 Aortic Valve Disease.                 Aortic Valve: BENTALL PROCEDURE AND HEMIARCH USING                 HEMASHIELD PLATINUM VASCULAR GRAFT AND KONECT RESILIA                 AORTIC VALVE CONDUIT valve is present in the aortic position.                 Procedure Date: 02/06/24.   Sonographer:    Norleen Amour Referring Phys: [id number] HARRELL O LIGHTFOOT  IMPRESSIONS    1. Left ventricular ejection fraction, by estimation, is 60 to 65%. The left ventricle has normal function. The left ventricle has no regional wall motion abnormalities. There is mild left ventricular hypertrophy.  2. Right ventricular systolic function is normal. The right ventricular size is mildly enlarged.  3. A small pericardial effusion is present. There is no evidence of cardiac tamponade.  4. The mitral valve is normal in structure. No evidence of mitral valve regurgitation. No evidence of mitral stenosis.  5. The aortic valve has been repaired/replaced. Aortic valve regurgitation is not visualized. No aortic stenosis is present. There is a BENTALL PROCEDURE AND HEMIARCH USING HEMASHIELD PLATINUM VASCULAR GRAFT AND KONECT RESILIA AORTIC VALVE CONDUIT valve present in the aortic position. Procedure Date: 02/06/24. Echo findings are consistent with normal structure and function of the aortic valve  prosthesis. Aortic valve mean gradient measures 6.8 mmHg. Aortic valve Vmax measures 1.80 m/s.  6. Aortic dilatation noted. There is mild dilatation of the ascending aorta, measuring 41 mm.  7. The inferior vena cava is normal in size with greater than 50% respiratory variability, suggesting right atrial pressure of 3 mmHg.  FINDINGS  Left Ventricle: Left ventricular ejection fraction, by estimation, is 60 to 65%. The left ventricle has normal function. The left ventricle has no regional wall motion abnormalities. Definity  contrast agent was given IV to delineate the left ventricular  endocardial borders. The left ventricular internal cavity size was normal in size. There is mild left ventricular hypertrophy.  Right Ventricle: The right ventricular size is mildly enlarged. No increase in right ventricular wall thickness. Right ventricular systolic function is normal.  Left Atrium: Left atrial size was normal in size.  Right Atrium: Right atrial size was normal in size.  Pericardium: A small pericardial effusion is present. There is no evidence of cardiac tamponade.  Mitral Valve: The mitral valve is normal in structure. Mild mitral annular calcification. No evidence of mitral valve regurgitation. No evidence of mitral valve stenosis.  Tricuspid Valve: The tricuspid valve is normal in structure. Tricuspid valve regurgitation is not demonstrated. No evidence of tricuspid stenosis.  Aortic Valve: The aortic valve has been repaired/replaced. Aortic valve regurgitation is not visualized. No aortic stenosis is present. Aortic valve mean gradient measures 6.8 mmHg. Aortic valve peak gradient measures 12.9 mmHg.  Aortic valve area, by VTI  measures 2.06 cm. There is a BENTALL PROCEDURE AND HEMIARCH USING HEMASHIELD PLATINUM VASCULAR GRAFT AND KONECT RESILIA AORTIC VALVE CONDUIT valve present in the aortic position. Procedure Date: 02/06/24. Echo findings are consistent  with normal structure and function of the aortic valve prosthesis.  Pulmonic Valve: The pulmonic valve was normal in structure. Pulmonic valve regurgitation is not visualized. No evidence of pulmonic stenosis.  Aorta: Aortic dilatation noted. There is mild dilatation of the ascending aorta, measuring 41 mm.  Venous: The inferior vena cava is normal in size with greater than 50% respiratory variability, suggesting right atrial pressure of 3 mmHg.  IAS/Shunts: No atrial level shunt detected by color flow Doppler.    LEFT VENTRICLE PLAX 2D LVIDd:         4.70 cm      Diastology LVIDs:         3.20 cm      LV e' medial:    6.31 cm/s LV PW:         1.50 cm      LV E/e' medial:  9.6 LV IVS:        1.40 cm      LV e' lateral:   7.51 cm/s LVOT diam:     2.40 cm      LV E/e' lateral: 8.0 LV SV:         72 LV SV Index:   31 LVOT Area:     4.52 cm   LV Volumes (MOD) LV vol d, MOD A2C: 67.9 ml LV vol d, MOD A4C: 167.0 ml LV vol s, MOD A2C: 24.9 ml LV vol s, MOD A4C: 60.6 ml LV SV MOD A2C:     43.0 ml LV SV MOD A4C:     167.0 ml LV SV MOD BP:      68.0 ml  RIGHT VENTRICLE            IVC RV Basal diam:  4.70 cm    IVC diam: 2.20 cm RV S prime:     5.07 cm/s TAPSE (M-mode): 1.6 cm  LEFT ATRIUM              Index        RIGHT ATRIUM           Index LA diam:        4.10 cm  1.77 cm/m   RA Area:     26.20 cm LA Vol (A2C):   45.0 ml  19.39 ml/m  RA Volume:   99.80 ml  43.00 ml/m LA Vol (A4C):   108.0 ml 46.53 ml/m LA Biplane Vol: 73.1 ml  31.50 ml/m  AORTIC VALVE AV Area (Vmax):    1.91 cm AV Area (Vmean):   2.08 cm AV Area (VTI):     2.06 cm AV Vmax:           179.80 cm/s AV Vmean:          123.400 cm/s AV VTI:            0.352 m AV Peak Grad:      12.9 mmHg AV Mean Grad:      6.8 mmHg LVOT Vmax:         76.07 cm/s LVOT Vmean:        56.733 cm/s LVOT VTI:          0.160 m LVOT/AV VTI ratio: 0.45   AORTA Ao Root diam: 4.10 cm Ao  Asc diam:  3.00 cm  MITRAL  VALVE MV Area (PHT): 3.43 cm    SHUNTS MV Decel Time: 221 msec    Systemic VTI:  0.16 m MV E velocity: 60.30 cm/s  Systemic Diam: 2.40 cm MV A velocity: 73.50 cm/s MV E/A ratio:  0.82  Oneil Parchment MD Electronically signed by Oneil Parchment MD Signature Date/Time: 03/23/2024/1:49:21 PM       Final      Assessment/Plan: S/P CABG x 3 -We reviewed today's chest x ray.  -Echocardiogram showed small pericardial effusion, Left ventricular ejection fraction is 60 to 65%, The aortic valve has been repaired/replaced. Aortic valve regurgitation is not visualized. No aortic stenosis is present. There is a BENTALL PROCEDURE AND HEMIARCH USING HEMASHIELD PLATINUM VASCULAR GRAFT AND KONECT RESILIA AORTIC VALVE CONDUIT valve present in the aortic position. Procedure Date: 02/06/24. Echo findings are consistent with normal structure and function of the aortic valve prosthesis. Aortic valve mean gradient measures 6.8 mmHg. Aortic valve Vmax measures 1.80 m/s.  -He is scheduled for a heart catheterization on 03/26/2024 at 9 am.  He is to arrive at 7 am.  He is to hold xarelto  for 48 hours prior to procedure. Called and gave patient instructions and he states understanding -Alarm symptoms discussed and he is to be evaluated if he starts to have worsening chest pain that is not alleviated with nitroglycerin , shortness of breath, lower leg edema, dizziness  -He has not been seen by cardiology office but has had discussions with pacemaker team.  He was started on amiodarone  and xarelto  on 02/28/2024 -PCP stopped his midodrine  due to having not having hypotension -Follow up in one week with [facility]  Manuelita CHRISTELLA Rough, PA-C 12:46 PM 03/23/24

## 2024-03-26 ENCOUNTER — Ambulatory Visit (HOSPITAL_COMMUNITY)
Admission: RE | Admit: 2024-03-26 | Discharge: 2024-03-26 | Disposition: A | Discharge status: Home / Self Care | Payer: PRIVATE HEALTH INSURANCE | Attending: Cardiovascular Disease | Admitting: Cardiovascular Disease

## 2024-03-26 ENCOUNTER — Other Ambulatory Visit: Payer: Self-pay

## 2024-03-26 ENCOUNTER — Encounter (HOSPITAL_COMMUNITY)
Admission: RE | Disposition: A | Discharge status: Home / Self Care | Payer: Self-pay | Source: Home / Self Care | Attending: Cardiovascular Disease

## 2024-03-26 DIAGNOSIS — I25110 Atherosclerotic heart disease of native coronary artery with unstable angina pectoris: Secondary | ICD-10-CM | POA: Diagnosis present

## 2024-03-26 DIAGNOSIS — I442 Atrioventricular block, complete: Secondary | ICD-10-CM | POA: Diagnosis not present

## 2024-03-26 DIAGNOSIS — Z79899 Other long term (current) drug therapy: Secondary | ICD-10-CM | POA: Insufficient documentation

## 2024-03-26 DIAGNOSIS — Z950 Presence of cardiac pacemaker: Secondary | ICD-10-CM | POA: Insufficient documentation

## 2024-03-26 DIAGNOSIS — I2510 Atherosclerotic heart disease of native coronary artery without angina pectoris: Secondary | ICD-10-CM

## 2024-03-26 DIAGNOSIS — I7121 Aneurysm of the ascending aorta, without rupture: Secondary | ICD-10-CM | POA: Diagnosis not present

## 2024-03-26 DIAGNOSIS — Z7901 Long term (current) use of anticoagulants: Secondary | ICD-10-CM | POA: Insufficient documentation

## 2024-03-26 DIAGNOSIS — Z951 Presence of aortocoronary bypass graft: Secondary | ICD-10-CM | POA: Diagnosis not present

## 2024-03-26 DIAGNOSIS — R0789 Other chest pain: Secondary | ICD-10-CM

## 2024-03-26 HISTORY — PX: LEFT HEART CATH AND CORS/GRAFTS ANGIOGRAPHY: CATH118250

## 2024-03-26 LAB — GLUCOSE, CAPILLARY
Glucose-Capillary: 143 mg/dL — ABNORMAL HIGH (ref 70–99)
Glucose-Capillary: 118 mg/dL — ABNORMAL HIGH (ref 70–99)

## 2024-03-26 LAB — BASIC METABOLIC PANEL WITH GFR
Anion gap: 7 (ref 5–15)
BUN: 10 mg/dL (ref 6–20)
CO2: 28 mmol/L (ref 22–32)
Calcium: 9.1 mg/dL (ref 8.9–10.3)
Chloride: 106 mmol/L (ref 98–111)
Creatinine, Ser: 0.74 mg/dL (ref 0.61–1.24)
GFR, Estimated: >60 mL/min (ref >60)
Glucose, Bld: 125 mg/dL — ABNORMAL HIGH (ref 70–99)
Potassium: 4.2 mmol/L (ref 3.5–5.1)
Sodium: 141 mmol/L (ref 135–145)

## 2024-03-26 LAB — CBC
HCT: 38.5 % — ABNORMAL LOW (ref 39.0–52.0)
Hemoglobin: 11 g/dL — ABNORMAL LOW (ref 13.0–17.0)
MCH: 24.7 pg — ABNORMAL LOW (ref 26.0–34.0)
MCHC: 28.6 g/dL — ABNORMAL LOW (ref 30.0–36.0)
MCV: 86.3 fL (ref 80.0–100.0)
Platelets: 325 K/uL (ref 150–400)
RBC: 4.46 MIL/uL (ref 4.22–5.81)
RDW: 16.2 % — ABNORMAL HIGH (ref 11.5–15.5)
WBC: 7.1 K/uL (ref 4.0–10.5)
nRBC: 0 % (ref 0.0–0.2)

## 2024-03-26 SURGERY — LEFT HEART CATH AND CORS/GRAFTS ANGIOGRAPHY
Anesthesia: LOCAL

## 2024-03-26 MED ORDER — LABETALOL HCL 5 MG/ML IV SOLN: 10.0000 mg | INTRAVENOUS | Status: DC | PRN

## 2024-03-26 MED ORDER — MIDAZOLAM HCL 2 MG/2ML IJ SOLN
INTRAMUSCULAR | Status: DC | PRN
  Administered 2024-03-26: 2 mg via INTRAVENOUS
  Administered 2024-03-26: 1 mg via INTRAVENOUS

## 2024-03-26 MED ORDER — ACETAMINOPHEN 325 MG PO TABS: 650.0000 mg | ORAL_TABLET | ORAL | Status: DC | PRN

## 2024-03-26 MED ORDER — LIDOCAINE HCL (PF) 1 % IJ SOLN
INTRAMUSCULAR | Status: AC
  Filled 2024-03-26: qty 30

## 2024-03-26 MED ORDER — SODIUM CHLORIDE 0.9 % IV SOLN: 250.0000 mL | INTRAVENOUS | Status: DC | PRN

## 2024-03-26 MED ORDER — SODIUM CHLORIDE 0.9% FLUSH: 3.0000 mL | Freq: Two times a day (BID) | INTRAVENOUS | Status: DC

## 2024-03-26 MED ORDER — LIDOCAINE HCL (PF) 1 % IJ SOLN
INTRAMUSCULAR | Status: DC | PRN
  Administered 2024-03-26: 5 mL

## 2024-03-26 MED ORDER — ASPIRIN 81 MG PO CHEW
81.0000 mg | CHEWABLE_TABLET | ORAL | Status: AC
  Administered 2024-03-26: 81 mg via ORAL
  Filled 2024-03-26: qty 1

## 2024-03-26 MED ORDER — FENTANYL CITRATE (PF) 100 MCG/2ML IJ SOLN
INTRAMUSCULAR | Status: AC
  Filled 2024-03-26: qty 2

## 2024-03-26 MED ORDER — MIDAZOLAM HCL 2 MG/2ML IJ SOLN
INTRAMUSCULAR | Status: AC
  Filled 2024-03-26: qty 2

## 2024-03-26 MED ORDER — SODIUM CHLORIDE 0.9% FLUSH: 3.0000 mL | INTRAVENOUS | Status: DC | PRN

## 2024-03-26 MED ORDER — HYDRALAZINE HCL 20 MG/ML IJ SOLN: 10.0000 mg | INTRAMUSCULAR | Status: DC | PRN

## 2024-03-26 MED ORDER — ONDANSETRON HCL 4 MG/2ML IJ SOLN: 4.0000 mg | Freq: Four times a day (QID) | INTRAMUSCULAR | Status: DC | PRN

## 2024-03-26 MED ORDER — FREE WATER
500.0000 mL | Freq: Once | Status: AC
  Administered 2024-03-26: 500 mL via ORAL

## 2024-03-26 MED ORDER — FENTANYL CITRATE (PF) 100 MCG/2ML IJ SOLN
INTRAMUSCULAR | Status: DC | PRN
  Administered 2024-03-26 (×2): 25 ug via INTRAVENOUS

## 2024-03-26 MED ORDER — FREE WATER: 500.0000 mL | Freq: Once | Status: DC

## 2024-03-26 MED ORDER — HEPARIN (PORCINE) IN NACL 1000-0.9 UT/500ML-% IV SOLN
INTRAVENOUS | Status: DC | PRN
  Administered 2024-03-26: 1000 mL

## 2024-03-26 SURGICAL SUPPLY — 15 items
CATH EXPO 5F MPA-1 (CATHETERS) IMPLANT
CATH INFINITI 5 FR AL2 (CATHETERS) IMPLANT
CATH INFINITI 5 FR JL6.0 (CATHETERS) IMPLANT
CATH INFINITI 5 FR LCB (CATHETERS) IMPLANT
CATH INFINITI 5FR AL1 (CATHETERS) IMPLANT
CATH INFINITI 5FR JL5 (CATHETERS) IMPLANT
CATH INFINITI 5FR MULTPACK ANG (CATHETERS) IMPLANT
CLOSURE MYNX CONTROL 5F (Vascular Products) IMPLANT
KIT MICROPUNCTURE NIT STIFF (SHEATH) IMPLANT
PACK CARDIAC CATHETERIZATION (CUSTOM PROCEDURE TRAY) ×1 IMPLANT
SET ATX-X65L (MISCELLANEOUS) IMPLANT
SHEATH PINNACLE 5F 10CM (SHEATH) IMPLANT
SHEATH PROBE COVER 6X72 (BAG) IMPLANT
WIRE EMERALD 3MM-J .035X150CM (WIRE) IMPLANT
WIRE EMERALD 3MM-J .035X260CM (WIRE) IMPLANT

## 2024-03-26 NOTE — Interval H&P Note (Signed)
 History and Physical Interval Note:  03/26/2024 12:10 PM  Colin Ramirez  has presented today for surgery, with the diagnosis of unstable angina.  The various methods of treatment have been discussed with the patient and family. After consideration of risks, benefits and other options for treatment, the patient has consented to  Procedure(s): LEFT HEART CATH AND CORS/GRAFTS ANGIOGRAPHY (N/A) as a surgical intervention.  The patient's history has been reviewed, patient examined, no change in status, stable for surgery.  I have reviewed the patient's chart and labs.  Questions were answered to the patient's satisfaction.    Plan for right femoral access for the procedure. I have reviewed the risks, indications, and alternatives to cardiac catheterization, possible angioplasty, and stenting with the patient. Risks include but are not limited to bleeding, infection, vascular injury, stroke, myocardial infection, arrhythmia, kidney injury, radiation-related injury in the case of prolonged fluoroscopy use, emergency cardiac surgery, and death. The patient understands the risks of serious complication is 1-2 in 1000 with diagnostic cardiac cath and 1-2% or less with angioplasty/stenting.  Pt with recent Bentall surgery and CABG, graft anatomy reviewed.   Ozell Fell

## 2024-03-26 NOTE — Progress Notes (Signed)
 Patient and family given discharge education. Patient and family state they do not have any further questions at this time. Patient ambulated and was able to void, patient able to tolerate PO intake of solids and liquids. Blood sugar checked. Site is clean,dry,intact and soft upon discharge. IV removed.

## 2024-03-27 ENCOUNTER — Encounter (HOSPITAL_COMMUNITY): Payer: Self-pay | Admitting: Cardiovascular Disease

## 2024-03-27 ENCOUNTER — Ambulatory Visit (INDEPENDENT_AMBULATORY_CARE_PROVIDER_SITE_OTHER): Payer: PRIVATE HEALTH INSURANCE

## 2024-03-27 DIAGNOSIS — I442 Atrioventricular block, complete: Secondary | ICD-10-CM

## 2024-03-28 ENCOUNTER — Other Ambulatory Visit: Payer: Self-pay | Admitting: Thoracic Surgery (Cardiothoracic Vascular Surgery)

## 2024-03-28 DIAGNOSIS — Z951 Presence of aortocoronary bypass graft: Secondary | ICD-10-CM

## 2024-03-29 ENCOUNTER — Ambulatory Visit: Payer: Self-pay | Admitting: Cardiology

## 2024-03-29 LAB — CUP PACEART REMOTE DEVICE CHECK
Battery Remaining Longevity: 124 mo
Battery Remaining Percentage: 95.5 %
Battery Voltage: 3.04 V
Brady Statistic AP VP Percent: 1 %
Brady Statistic AP VS Percent: 1 %
Brady Statistic AS VP Percent: 99 %
Brady Statistic AS VS Percent: 1 %
Brady Statistic RA Percent Paced: 1 %
Brady Statistic RV Percent Paced: 99 %
Date Time Interrogation Session: 20250930020018
Implantable Lead Connection Status: 753985
Implantable Lead Connection Status: 753985
Implantable Lead Implant Date: 20250815
Implantable Lead Implant Date: 20250815
Implantable Lead Location: 753859
Implantable Lead Location: 753860
Implantable Pulse Generator Implant Date: 20250815
Lead Channel Impedance Value: 430 Ohm
Lead Channel Impedance Value: 550 Ohm
Lead Channel Pacing Threshold Amplitude: 0.625 V
Lead Channel Pacing Threshold Amplitude: 1 V
Lead Channel Pacing Threshold Pulse Width: 0.5 ms
Lead Channel Pacing Threshold Pulse Width: 0.5 ms
Lead Channel Sensing Intrinsic Amplitude: 12 mV
Lead Channel Sensing Intrinsic Amplitude: 5 mV
Lead Channel Setting Pacing Amplitude: 0.875
Lead Channel Setting Pacing Amplitude: 2 V
Lead Channel Setting Pacing Pulse Width: 0.5 ms
Lead Channel Setting Sensing Sensitivity: 4 mV
Pulse Gen Model: 2272
Pulse Gen Serial Number: 8296661

## 2024-03-30 ENCOUNTER — Other Ambulatory Visit: Payer: Self-pay

## 2024-03-30 ENCOUNTER — Ambulatory Visit: Payer: PRIVATE HEALTH INSURANCE

## 2024-03-30 ENCOUNTER — Other Ambulatory Visit (HOSPITAL_COMMUNITY): Payer: Self-pay

## 2024-03-30 ENCOUNTER — Ambulatory Visit (HOSPITAL_COMMUNITY)
Admission: RE | Admit: 2024-03-30 | Discharge: 2024-03-30 | Disposition: A | Discharge status: Home / Self Care | Payer: PRIVATE HEALTH INSURANCE | Source: Ambulatory Visit | Attending: Cardiology | Admitting: Cardiology

## 2024-03-30 ENCOUNTER — Telehealth: Payer: Self-pay | Admitting: Cardiology

## 2024-03-30 VITALS — BP 132/86 | HR 88 | Resp 18 | Ht 73.0 in | Wt 238.0 lb

## 2024-03-30 DIAGNOSIS — Z9889 Other specified postprocedural states: Secondary | ICD-10-CM

## 2024-03-30 DIAGNOSIS — Z951 Presence of aortocoronary bypass graft: Secondary | ICD-10-CM | POA: Insufficient documentation

## 2024-03-30 DIAGNOSIS — Z952 Presence of prosthetic heart valve: Secondary | ICD-10-CM

## 2024-03-30 DIAGNOSIS — Z8679 Personal history of other diseases of the circulatory system: Secondary | ICD-10-CM

## 2024-03-30 MED ORDER — AMIODARONE HCL 200 MG PO TABS
200.0000 mg | ORAL_TABLET | Freq: Every day | ORAL | 0 refills | Status: DC
  Filled 2024-03-30: qty 30, 30d supply, fill #0

## 2024-03-30 NOTE — Telephone Encounter (Signed)
 Patient had surgery on Monday with [facility] to verify that all of his procedures were ok.They stated he was in A-fib on and off and he had pressure still for over 12 hours and needs to be seen sooner than his November appt. He has never seen his cardiac surgeon before also.

## 2024-03-30 NOTE — Telephone Encounter (Signed)
 Left message to call back.

## 2024-03-30 NOTE — Patient Instructions (Addendum)
 You may continue to gradually increase your physical activity as tolerated.  Refrain from any heavy lifting or strenuous use of your arms and shoulders until at least 6 weeks from the time of your surgery, and avoid activities that cause increased pain in your chest on the side of your surgical incision.  Otherwise you may continue to increase activities without any particular limitations.  Increase the intensity and duration of physical activity gradually.   You may return to driving an automobile as long as you are no longer requiring oral narcotic pain relievers during the daytime.  It would be wise to start driving only short distances during the daylight and gradually increase from there as you feel comfortable.   Endocarditis is a potentially serious infection of heart valves or inside lining of the heart.  It occurs more commonly in patients with diseased heart valves (such as patient's with aortic or mitral valve disease) and in patients who have undergone heart valve repair or replacement.  Certain surgical and dental procedures may put you at risk, such as dental cleaning, other dental procedures, or any surgery involving the respiratory, urinary, gastrointestinal tract, gallbladder or prostate gland.   To minimize your chances for develooping endocarditis, maintain good oral health and seek prompt medical attention for any infections involving the mouth, teeth, gums, skin or urinary tract.    Always notify your doctor or dentist about your underlying heart valve condition before having any invasive procedures. You will need to take antibiotics before certain procedures, including all routine dental cleanings or other dental procedures.  Your cardiologist or dentist should prescribe these antibiotics for you to be taken ahead of time.

## 2024-03-30 NOTE — Telephone Encounter (Signed)
 Patient was a walk in to the office today after seeing PA with [facility] on the 4th floor.  [facility] PA recommended they come upstairs to try and get in with Dr. Kennyth or [facility] to discuss follow up related to his recent cath and that when he flips into A-fib he is having chest tightness.  He reported to not having symptoms at the time of our conversation today but but him and his wife were curious on recommendations in amio dosage and would increasing it help since he was having chest pain with the A-fib.  He reported to taking nitroglycerin  during the time of pain with the A-fib and that helped his pain.  Please advise.  I will look to schedule patient for follow up visit.   Lonni Glendia Richards RN, BSN, CCRN-A

## 2024-03-30 NOTE — Progress Notes (Addendum)
 [address] [place] [address]             [phone]       HPI:  Patient returns for routine postoperative follow-up having undergone CABG X 3.  LIMA LAD, RSVG PDA, diagonal, Bentall Procedure with 29mm Konect Resilia, Right and left coronary re-implantation. Endoscopic greater saphenous vein harvest on the right, Right axillary artery cannulation with 8 mm graft, Antegrade cerebral perfusion, Ascending aortic aneurysm/Hemiarch replacement with 30mm graft on 02/06/2024 with Dr. Shyrl.  The patient's early postoperative recovery while in the hospital was notable for being paced since he developed complete heart block. Electrophysiology was consulted and he had a permanent pacemaker implanted on 08/15. He was started on midodrine  for blood pressure support.  He was stable for discharge on 02/12/2024 .   He was seen in clinic on 03/23/2024.  At this visit he reported having chest pain and needed to use nitroglycerin  for relief.  He was scheduled for an echo and Left heart cath which had reassuring findings that bypass grafts were patent and hemiarch graft and aortic valve replacement were functioning appropriately.   His pacemaker report shows that he has periods of atrial fibrillation and he is currently talking xarelto  and amiodarone .   Since last visit and procedure he has been having chest pain/tightness intermittently which he now attributes to when he is in atrial fibrillation. This has been happening less frequently over the past week and he has not been taking nitroglycerin  for the pain.   In office today he is not experiencing chest pain/tightness.    Allergies as of 03/30/2024       Reactions   Omnicef  [cefdinir ] Rash        Medication List        Accurate as of March 30, 2024  2:08 PM. If you have any questions, ask your nurse or doctor.          amiodarone  200 MG tablet Commonly known as: PACERONE  Take 1 tablet (200 mg total) by  mouth daily.   amLODipine  10 MG tablet Commonly known as: NORVASC  Take 10 mg by mouth daily.   aspirin  EC 81 MG tablet TAKE 1 TABLET DAILY.   atorvastatin  40 MG tablet Commonly known as: LIPITOR Take 1 tablet (40 mg total) by mouth daily.   dapagliflozin  propanediol 10 MG Tabs tablet Commonly known as: Farxiga  Take 1 tablet (10 mg total) by mouth daily.   Dexcom G7 Receiver Devi Use as directed with compatible sensors to monitor blood sugar levels.   esomeprazole  40 MG capsule Commonly known as: NEXIUM  Take 1 capsule (40 mg total) by mouth daily.   gabapentin  300 MG capsule Commonly known as: NEURONTIN  Take 300 mg by mouth.   guaiFENesin  600 MG 12 hr tablet Commonly known as: MUCINEX  Take 1 tablet (600 mg total) by mouth 2 (two) times daily as needed.   HYDROmorphone  4 MG tablet Commonly known as: DILAUDID  Take 4 mg by mouth every 4 (four) hours as needed for severe pain (pain score 7-10).   levothyroxine  200 MCG tablet Commonly known as: SYNTHROID  Take 200 mcg by mouth.   metFORMIN  500 MG 24 hr tablet Commonly known as: GLUCOPHAGE -XR Take 2 tablets (1,000 mg total) by mouth 2 (two) times daily with a meal.   Misc. Devices Misc Dispense CPAP mask, tank, and tubing for patient use nightly.   nitroGLYCERIN  0.4 MG SL tablet Commonly known as: NITROSTAT   Place 1 tablet (0.4 mg total) under the tongue every 5 (five) minutes as needed for chest pain.   ramipril  10 MG capsule Commonly known as: ALTACE  Take 10 mg by mouth daily.   rivaroxaban  20 MG Tabs tablet Commonly known as: XARELTO  Take 1 tablet (20 mg total) by mouth daily with supper.         ROS  Review of Systems  Constitutional:  Positive for malaise/fatigue. Negative for fever.  Respiratory: Negative.  Negative for cough and shortness of breath.   Cardiovascular:  Positive for chest pain. Negative for leg swelling.  Neurological:  Negative for headaches.     BP 132/86   Pulse 88   Resp 18    Ht 6' 1 (1.854 m)   Wt 238 lb (108 kg)   SpO2 96%   BMI 31.40 kg/m   Physical Exam Constitutional:      Appearance: Normal appearance.  HENT:     Head: Normocephalic and atraumatic.  Cardiovascular:     Rate and Rhythm: Normal rate and regular rhythm.     Heart sounds: Normal heart sounds, S1 normal and S2 normal.  Pulmonary:     Effort: Pulmonary effort is normal.     Breath sounds: Normal breath sounds.  Skin:    General: Skin is warm and dry.  Neurological:     General: No focal deficit present.     Mental Status: He is alert and oriented to person, place, and time.       Imaging: CLINICAL DATA:  Status post CABG and Bentall procedure on 02/06/2024.   EXAM: CHEST - 2 VIEW   COMPARISON:  03/23/2024   FINDINGS: Stable heart size, appearance prosthetic aortic valve and course dual-chamber pacemaker since the prior chest x-ray. Stable small left pleural effusion with associated left basilar atelectasis. There is no evidence of pulmonary edema, consolidation, pneumothorax or nodule. The visualized skeletal structures are unremarkable.   IMPRESSION: Stable small left pleural effusion with associated left basilar atelectasis.     Electronically Signed   By: Marcey Moan M.D.   On: 03/30/2024 09:52       ECHOCARDIOGRAM REPORT       Patient Name:   Colin Ramirez Date of Exam: 03/23/2024 Medical Rec #:  [id number]         Height:       73.0 in Accession #:    [id number]        Weight:       239.0 lb Date of Birth:  01-Dec-1965         BSA:          2.321 m Patient Age:    58 years          BP:           119/78 mmHg Patient Gender: M                 HR:           77 bpm. Exam Location:  Inpatient  Procedure: 2D Echo and Intracardiac Opacification Agent (Both Spectral and Color            Flow Doppler were utilized during procedure).  Indications:    S/P cabg and AVR   History:        Patient has prior history of Echocardiogram examinations. CAD;                  Aortic Valve Disease.  Aortic Valve: BENTALL PROCEDURE AND HEMIARCH USING                 HEMASHIELD PLATINUM VASCULAR GRAFT AND KONECT RESILIA                 AORTIC VALVE CONDUIT valve is present in the aortic position.                 Procedure Date: 02/06/24.   Sonographer:    Norleen Amour Referring Phys: [id number] HARRELL O LIGHTFOOT  IMPRESSIONS    1. Left ventricular ejection fraction, by estimation, is 60 to 65%. The left ventricle has normal function. The left ventricle has no regional wall motion abnormalities. There is mild left ventricular hypertrophy.  2. Right ventricular systolic function is normal. The right ventricular size is mildly enlarged.  3. A small pericardial effusion is present. There is no evidence of cardiac tamponade.  4. The mitral valve is normal in structure. No evidence of mitral valve regurgitation. No evidence of mitral stenosis.  5. The aortic valve has been repaired/replaced. Aortic valve regurgitation is not visualized. No aortic stenosis is present. There is a BENTALL PROCEDURE AND HEMIARCH USING HEMASHIELD PLATINUM VASCULAR GRAFT AND KONECT RESILIA AORTIC VALVE CONDUIT valve present in the aortic position. Procedure Date: 02/06/24. Echo findings are consistent with normal structure and function of the aortic valve prosthesis. Aortic valve mean gradient measures 6.8 mmHg. Aortic valve Vmax measures 1.80 m/s.  6. Aortic dilatation noted. There is mild dilatation of the ascending aorta, measuring 41 mm.  7. The inferior vena cava is normal in size with greater than 50% respiratory variability, suggesting right atrial pressure of 3 mmHg.  FINDINGS  Left Ventricle: Left ventricular ejection fraction, by estimation, is 60 to 65%. The left ventricle has normal function. The left ventricle has no regional wall motion abnormalities. Definity  contrast agent was given IV to delineate the left  ventricular  endocardial borders. The left ventricular internal cavity size was normal in size. There is mild left ventricular hypertrophy.  Right Ventricle: The right ventricular size is mildly enlarged. No increase in right ventricular wall thickness. Right ventricular systolic function is normal.  Left Atrium: Left atrial size was normal in size.  Right Atrium: Right atrial size was normal in size.  Pericardium: A small pericardial effusion is present. There is no evidence of cardiac tamponade.  Mitral Valve: The mitral valve is normal in structure. Mild mitral annular calcification. No evidence of mitral valve regurgitation. No evidence of mitral valve stenosis.  Tricuspid Valve: The tricuspid valve is normal in structure. Tricuspid valve regurgitation is not demonstrated. No evidence of tricuspid stenosis.  Aortic Valve: The aortic valve has been repaired/replaced. Aortic valve regurgitation is not visualized. No aortic stenosis is present. Aortic valve mean gradient measures 6.8 mmHg. Aortic valve peak gradient measures 12.9 mmHg. Aortic valve area, by VTI  measures 2.06 cm. There is a BENTALL PROCEDURE AND HEMIARCH USING HEMASHIELD PLATINUM VASCULAR GRAFT AND KONECT RESILIA AORTIC VALVE CONDUIT valve present in the aortic position. Procedure Date: 02/06/24. Echo findings are consistent with normal structure and function of the aortic valve prosthesis.  Pulmonic Valve: The pulmonic valve was normal in structure. Pulmonic valve regurgitation is not visualized. No evidence of pulmonic stenosis.  Aorta: Aortic dilatation noted. There is mild dilatation of the ascending aorta, measuring 41 mm.  Venous: The inferior vena cava is normal in size with greater than 50% respiratory variability, suggesting right atrial  pressure of 3 mmHg.  IAS/Shunts: No atrial level shunt detected by color flow Doppler.    LEFT VENTRICLE PLAX 2D LVIDd:         4.70 cm       Diastology LVIDs:         3.20 cm      LV e' medial:    6.31 cm/s LV PW:         1.50 cm      LV E/e' medial:  9.6 LV IVS:        1.40 cm      LV e' lateral:   7.51 cm/s LVOT diam:     2.40 cm      LV E/e' lateral: 8.0 LV SV:         72 LV SV Index:   31 LVOT Area:     4.52 cm   LV Volumes (MOD) LV vol d, MOD A2C: 67.9 ml LV vol d, MOD A4C: 167.0 ml LV vol s, MOD A2C: 24.9 ml LV vol s, MOD A4C: 60.6 ml LV SV MOD A2C:     43.0 ml LV SV MOD A4C:     167.0 ml LV SV MOD BP:      68.0 ml  RIGHT VENTRICLE            IVC RV Basal diam:  4.70 cm    IVC diam: 2.20 cm RV S prime:     5.07 cm/s TAPSE (M-mode): 1.6 cm  LEFT ATRIUM              Index        RIGHT ATRIUM           Index LA diam:        4.10 cm  1.77 cm/m   RA Area:     26.20 cm LA Vol (A2C):   45.0 ml  19.39 ml/m  RA Volume:   99.80 ml  43.00 ml/m LA Vol (A4C):   108.0 ml 46.53 ml/m LA Biplane Vol: 73.1 ml  31.50 ml/m  AORTIC VALVE AV Area (Vmax):    1.91 cm AV Area (Vmean):   2.08 cm AV Area (VTI):     2.06 cm AV Vmax:           179.80 cm/s AV Vmean:          123.400 cm/s AV VTI:            0.352 m AV Peak Grad:      12.9 mmHg AV Mean Grad:      6.8 mmHg LVOT Vmax:         76.07 cm/s LVOT Vmean:        56.733 cm/s LVOT VTI:          0.160 m LVOT/AV VTI ratio: 0.45   AORTA Ao Root diam: 4.10 cm Ao Asc diam:  3.00 cm  MITRAL VALVE MV Area (PHT): 3.43 cm    SHUNTS MV Decel Time: 221 msec    Systemic VTI:  0.16 m MV E velocity: 60.30 cm/s  Systemic Diam: 2.40 cm MV A velocity: 73.50 cm/s MV E/A ratio:  0.82  Oneil Parchment MD Electronically signed by Oneil Parchment MD Signature Date/Time: 03/23/2024/1:49:21 PM       Final       LEFT HEART CATH AND CORS/GRAFTS ANGIOGRAPHY   Conclusion  1.  Left main, proximal LAD, and proximal circumflex all patent, poorly visualized with difficulty cannulating post Bentall surgery 2.  Patent RCA with moderate distal vessel stenosis 3.  Continued patency of the  LIMA to LAD with no stenosis 4.  Continued patency of the SVG to right PDA with no stenosis 5.  Continued patency of the SVG to diagonal with no stenosis   Reassuring findings.  Patent bypass grafts noted.  Technically difficult procedure due to difficulty with catheter manipulation in the ascending aorta   Assessment/Plan:  S/P CABG x 3 -We reviewed today's chest x ray, echocardiogram and left heart cath results.  -He should continue to try to be active and slowly increase his activity as tolerated -He is cleared to drive since he is not taking narcotics for pain. First time driving should be a short distance in the daytime and he can slowly increase -Discussed participation in cardiac rehab, he is cleared to from a surgical standpoint at this time but needs clearance from cardiology - He has not had an appointment with cardiology since hospital discharge.  Appointment made on 04/02/2024.  He is to continue medications as prescribed until this appointment  S/P aortic valve replacement -Discussed need for prophylactic antibiotic for dental procedures, cleanings and minor surgeries      Status post aortic aneurysm repair -Follow up in 6 months with CT[facility] for continued repair surveillance    Manuelita CHRISTELLA Rough, PA-C 2:08 PM 03/30/24

## 2024-03-31 NOTE — Telephone Encounter (Signed)
 Looks like he has an appt with Josefa Beauvais this week. He just had a reassuring cath and his recent device interrogation showed AF burden well-controlled at <1%.
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
## 2024-04-02 ENCOUNTER — Ambulatory Visit: Payer: PRIVATE HEALTH INSURANCE | Attending: General Practice | Admitting: General Practice

## 2024-04-02 ENCOUNTER — Encounter: Payer: Self-pay | Admitting: General Practice

## 2024-04-02 ENCOUNTER — Other Ambulatory Visit (HOSPITAL_COMMUNITY): Payer: Self-pay

## 2024-04-02 VITALS — BP 112/66 | HR 81 | Ht 73.0 in | Wt 240.0 lb

## 2024-04-02 DIAGNOSIS — Z951 Presence of aortocoronary bypass graft: Secondary | ICD-10-CM | POA: Diagnosis not present

## 2024-04-02 DIAGNOSIS — I4891 Unspecified atrial fibrillation: Secondary | ICD-10-CM | POA: Diagnosis not present

## 2024-04-02 DIAGNOSIS — I442 Atrioventricular block, complete: Secondary | ICD-10-CM | POA: Diagnosis not present

## 2024-04-02 DIAGNOSIS — E782 Mixed hyperlipidemia: Secondary | ICD-10-CM | POA: Diagnosis not present

## 2024-04-02 MED ORDER — RANOLAZINE ER 500 MG PO TB12
500.0000 mg | ORAL_TABLET | Freq: Two times a day (BID) | ORAL | 2 refills | Status: AC
  Filled 2024-04-02: qty 60, 30d supply, fill #0

## 2024-04-02 MED ORDER — RANOLAZINE ER 500 MG PO TB12: 500.0000 mg | ORAL_TABLET | Freq: Two times a day (BID) | ORAL | 2 refills | Status: DC

## 2024-04-02 NOTE — Telephone Encounter (Signed)
 Pt has appt with Josefa Beauvais, NP today (04/02/24) at 3:35 PM.

## 2024-04-02 NOTE — Patient Instructions (Addendum)
 Medication Instructions:  Your physician has recommended you make the following change in your medication:   -Start ranolazine (Renexa) 500mg  twice daily.  *If you need a refill on your cardiac medications before your next appointment, please call your pharmacy*  Follow-Up: At [facility], you and your health needs are our priority.  As part of our continuing mission to provide you with exceptional heart care, our providers are all part of one team.  This team includes your primary Cardiologist (physician) and Advanced Practice Providers or APPs (Physician Assistants and Nurse Practitioners) who all work together to provide you with the care you need, when you need it.  Your next appointment:   3 month(s)  Provider:   Dr. Dominique Fell or Josefa Beauvais, NP         Other Instructions Slowly increase activity as tolerated.          Sternal Precautions After Sternotomy  A sternotomy is a surgery in which the flat bone in the middle of the chest (sternum) is cut open. After a sternotomy, sternal precautions are used to protect your sternum and the incision over the sternum while it is healing. A sternotomy is performed during surgeries that involve your heart, lungs, or blood vessels. Follow these instructions at home: Sternal precautions may be prescribed for 6 to 8 weeks after your sternotomy. Ask your health care provider how long to use precautions. Activity  Sternal precautions may include the following activity restrictions: You may have to avoid lifting. Ask your health care provider how much you can safely lift. Avoid pushing or pulling anything heavier than 5-10 lb (2.3-4.5 kg) with your upper extremities. Avoid overhead movements. Move in and out of the bed or chair using aids such as side rails or assistive devices. Hold pressure against your chest with crossed arms or a pillow when changing positions. Return to your normal activities as told by your health care  provider. Ask your health care provider what activities are safe for you. Participate in physical therapy or a cardiac rehabilitation program as told by your health care provider. Physical therapy involves doing exercises to maintain movement, strengthen your muscles, and build your endurance. A cardiac rehabilitation program is a treatment program to improve your health and well-being through exercise training, education, and counseling. Stop any activity right away if you have chest pain, shortness of breath, irregular heartbeats (palpitations), or dizziness. Get help right away if you have any of these symptoms. Driving Do not drive until your health care provider says that it is safe. Ask your health care provider when you may return to work. Incision care Keep incision areas clean, dry, and protected. Check your incision area every day for signs of infection. Check for: Redness, swelling, or pain. Fluid or blood. Warmth. Pus or a bad smell. Do not take baths, swim, or use a hot tub until your health care provider approves. Do not apply lotions, creams, or ointments unless you are told to by your health care provider. General instructions Deep breathing and coughing are essential to keep the lungs inflated and to remove mucus, which prevent complications after sternotomy. Hold pressure against your chest with crossed arms or a pillow when coughing or sneezing. Do not use any products that contain nicotine or tobacco. These products include cigarettes, chewing tobacco, and vaping devices, such as e-cigarettes. These can delay healing after surgery. If you need help quitting, ask your health care provider. Ask your health care provider when you may resume  sexual activity. Where to find more information National Heart, Lung, and Blood Institute: [employer] Contact a health care provider if: You have signs of infection. You notice any part of your incision begins to open up and  separate. Get help right away if: You have chest pain. You have shortness of breath. You have palpitations. You have dizziness. These symptoms may be an emergency. Get help right away. Call 911. Do not wait to see if the symptoms will go away. Do not drive yourself to the hospital. This information is not intended to replace advice given to you by your health care provider. Make sure you discuss any questions you have with your health care provider. Document Revised: 10/20/2021 Document Reviewed: 10/20/2021 Elsevier Patient Education  2024 [employer].

## 2024-04-02 NOTE — Progress Notes (Signed)
 Remote PPM Transmission

## 2024-04-03 ENCOUNTER — Other Ambulatory Visit (HOSPITAL_COMMUNITY): Payer: Self-pay | Admitting: *Deleted

## 2024-04-03 DIAGNOSIS — Z951 Presence of aortocoronary bypass graft: Secondary | ICD-10-CM

## 2024-04-04 ENCOUNTER — Telehealth (HOSPITAL_COMMUNITY): Payer: Self-pay

## 2024-04-04 NOTE — Telephone Encounter (Signed)
 Referral signed by Dr. Wonda.  Attempted to call patient to schedule cardiac rehab- no answer, left message. Sent MyChart message.

## 2024-04-05 ENCOUNTER — Other Ambulatory Visit (HOSPITAL_COMMUNITY): Payer: Self-pay

## 2024-04-18 ENCOUNTER — Telehealth (HOSPITAL_COMMUNITY): Payer: Self-pay

## 2024-04-18 NOTE — Telephone Encounter (Signed)
 Attempted f/u call regarding cardiac rehab- no answer, left message. Patient has not responded to previous MyChart message sent and read on 10/08.  Closing referral.

## 2024-05-11 ENCOUNTER — Ambulatory Visit: Payer: PRIVATE HEALTH INSURANCE | Admitting: Cardiology

## 2024-05-23 ENCOUNTER — Ambulatory Visit: Payer: PRIVATE HEALTH INSURANCE | Admitting: Physician Assistant

## 2024-05-31 ENCOUNTER — Other Ambulatory Visit: Payer: Self-pay | Admitting: Cardiology

## 2024-06-06 NOTE — Telephone Encounter (Signed)
 Pt last saw Josefa Beauvais, NP 04/02/24, last labs 03/26/24 Creat 0.74, age 58, weight 108.9kg, CrCl 163.51, based on CrCl pt is on appropriate dosage of Xarelto  20mg  every day for afib.  Will refill rx.

## 2024-06-22 ENCOUNTER — Ambulatory Visit: Payer: PRIVATE HEALTH INSURANCE | Admitting: Physician Assistant

## 2024-06-26 ENCOUNTER — Ambulatory Visit

## 2024-06-26 DIAGNOSIS — I442 Atrioventricular block, complete: Secondary | ICD-10-CM | POA: Diagnosis not present

## 2024-06-29 LAB — CUP PACEART REMOTE DEVICE CHECK
Battery Remaining Longevity: 120 mo
Battery Remaining Percentage: 95.5 %
Battery Voltage: 3.02 V
Brady Statistic AP VP Percent: 8.6 %
Brady Statistic AP VS Percent: 1 %
Brady Statistic AS VP Percent: 91 %
Brady Statistic AS VS Percent: 1 %
Brady Statistic RA Percent Paced: 8.5 %
Brady Statistic RV Percent Paced: 99 %
Date Time Interrogation Session: 20260102012609
Implantable Lead Connection Status: 753985
Implantable Lead Connection Status: 753985
Implantable Lead Implant Date: 20250815
Implantable Lead Implant Date: 20250815
Implantable Lead Location: 753859
Implantable Lead Location: 753860
Implantable Pulse Generator Implant Date: 20250815
Lead Channel Impedance Value: 480 Ohm
Lead Channel Impedance Value: 510 Ohm
Lead Channel Pacing Threshold Amplitude: 0.75 V
Lead Channel Pacing Threshold Amplitude: 0.875 V
Lead Channel Pacing Threshold Pulse Width: 0.5 ms
Lead Channel Pacing Threshold Pulse Width: 0.5 ms
Lead Channel Sensing Intrinsic Amplitude: 12 mV
Lead Channel Sensing Intrinsic Amplitude: 2.4 mV
Lead Channel Setting Pacing Amplitude: 1 V
Lead Channel Setting Pacing Amplitude: 1.875
Lead Channel Setting Pacing Pulse Width: 0.5 ms
Lead Channel Setting Sensing Sensitivity: 4 mV
Pulse Gen Model: 2272
Pulse Gen Serial Number: 8296661

## 2024-06-30 ENCOUNTER — Ambulatory Visit: Payer: Self-pay | Admitting: Cardiology
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
## 2024-07-02 NOTE — Progress Notes (Signed)
 Remote PPM Transmission

## 2024-07-04 ENCOUNTER — Telehealth: Payer: Self-pay | Admitting: General Practice

## 2024-07-04 ENCOUNTER — Ambulatory Visit: Payer: PRIVATE HEALTH INSURANCE | Attending: General Practice | Admitting: General Practice

## 2024-07-10 NOTE — Progress Notes (Unsigned)
 " Electrophysiology Office Note:   Date:  07/12/2024  ID:  Colin Ramirez, DOB Jan 27, 1966, MRN [id number]  Primary Cardiologist: MARALEE ELSPETH BROCKS, MD Primary Heart Failure: None Electrophysiologist: [name], MD       History of Present Illness:   Colin Ramirez is a 59 y.o. male with h/o 1AVB, HTN, HLD, CAD, AS s/p CABG with Bentall, subsequent CHB s/p PPM DM & tobacco abuse seen today for routine electrophysiology follow-up s/p Pacemaker implant.  Admitted in 01/2024 with chest pain. LHC showed complex stenosis in the proximal to mid LAD spanning 2 large diagonal branches. Also disease in small OM and distal RCA.  He was also identified to have a bicuspid AV with mild-mod stenosis & ascending aortic aneurysm (4.9 cm).  The patient was evaluated by TCTS and recommended for planned CABG / AVR / Bentall procedure. On 02/06/24, he underwent CABG x3 & Bentall Procedure. ICU course notable for CHB and ongoing need for pacing.  He subsequently underwent PPM placement.   After admission, he had recurrent chest discomfort.  He was seen 02/28/24 and reported episodes of shortness of breath, chest discomfort with AF.  He was started on amiodarone  200 mg daily and Xarelto  20 mg daily.  Given persistent symptoms he underwent LHC on 03/26/24 which showed patent bypass grafts.     Since last being seen in our clinic the patient reports he continues to have an odd pressure in his chest at times.  He wonders if it is related to AF.      He denies chest pain, palpitations, dyspnea, PND, orthopnea, nausea, vomiting, dizziness, syncope, edema, weight gain, or early satiety.    Review of systems complete and found to be negative unless listed in HPI.    EP Information / Studies Reviewed:    EKG is ordered today. Personal review as below.  EKG Interpretation Date/Time:  Thursday July 12 2024 08:45:59 EST Ventricular Rate:  69 PR Interval:  244 QRS Duration:  106 QT Interval:  442 QTC  Calculation: 473 R Axis:   75  Text Interpretation: Sinus rhythm with 1st degree A-V block Confirmed by Aniceto Jarvis ([id number]) on 07/12/2024 9:20:01 AM   PPM Interrogation-  reviewed in detail today,  See PACEART report.  Device History: Abbott Dual Chamber PPM implanted 02/10/24 for Symptomatic bradycardia  Risk Assessment/Calculations:              Physical Exam:   VS:  BP 138/80   Pulse 69   Ht 6' 1 (1.854 m)   Wt 258 lb (117 kg)   SpO2 96%   BMI 34.04 kg/m    Wt Readings from Last 3 Encounters:  07/12/24 258 lb (117 kg)  04/02/24 240 lb (108.9 kg)  03/30/24 238 lb (108 kg)     GEN: Well nourished, well developed in no acute distress NECK: No JVD; No carotid bruits CARDIAC: Regular rate and rhythm, no murmurs, rubs, gallops. Device site wnl, no tethering.  RESPIRATORY:  Clear to auscultation without rales, wheezing or rhonchi  ABDOMEN: Soft, non-tender, non-distended EXTREMITIES:  No edema; No deformity   ASSESSMENT AND PLAN:    CHB s/p AVR s/p Abbott PPM  -Normal PPM function -See Pace Art report -No changes today -reviewed remote monitoring   Paroxysmal Atrial Fibrillation  CHA2DS2-VASc 3, AF seen in recovery phase of CABG/ Bentall.   -continue OAC for stroke prophylaxis, reviewed indications for medication -no episodes of AF on device since 03/12/24 which lasted seconds, longest episode in  02/2024 was 20 minutes.  It would seem his chest pressure is unrelated to grafts / rhythm  -discussed AF is very common in the post CABG phase, reasonable to stop amiodarone  & monitor for reoccurrence  -will review remote monitor in March for recurrent AF burden   CAD s/p CABG x3 with Bentall / AVR  01/2024  -per Cardiology (previously followed with Dr. Maralee / Dr. Shlomo ) / TCTS  -no anginal symptoms    Disposition:   Follow up with Dr. Kennyth / EP APP in 12 months  Signed, Daphne Barrack, NP-C, AGACNP-BC [facility] - Electrophysiology  07/12/2024, 10:51  AM  "

## 2024-07-12 ENCOUNTER — Ambulatory Visit: Payer: PRIVATE HEALTH INSURANCE | Attending: Pulmonary Disease | Admitting: Pulmonary Disease

## 2024-07-12 ENCOUNTER — Encounter: Payer: Self-pay | Admitting: Pulmonary Disease

## 2024-07-12 VITALS — BP 138/80 | HR 69 | Ht 73.0 in | Wt 258.0 lb

## 2024-07-12 DIAGNOSIS — Z951 Presence of aortocoronary bypass graft: Secondary | ICD-10-CM

## 2024-07-12 DIAGNOSIS — I442 Atrioventricular block, complete: Secondary | ICD-10-CM | POA: Diagnosis not present

## 2024-07-12 LAB — CUP PACEART INCLINIC DEVICE CHECK
Battery Remaining Longevity: 121 mo
Battery Voltage: 3.01 V
Brady Statistic RA Percent Paced: 11 %
Brady Statistic RV Percent Paced: 99.95 %
Date Time Interrogation Session: 20260115120609
Implantable Lead Connection Status: 753985
Implantable Lead Connection Status: 753985
Implantable Lead Implant Date: 20250815
Implantable Lead Implant Date: 20250815
Implantable Lead Location: 753859
Implantable Lead Location: 753860
Implantable Pulse Generator Implant Date: 20250815
Lead Channel Impedance Value: 512.5 Ohm
Lead Channel Impedance Value: 512.5 Ohm
Lead Channel Pacing Threshold Amplitude: 0.75 V
Lead Channel Pacing Threshold Amplitude: 1.125 V
Lead Channel Pacing Threshold Pulse Width: 0.5 ms
Lead Channel Pacing Threshold Pulse Width: 0.5 ms
Lead Channel Sensing Intrinsic Amplitude: 12 mV
Lead Channel Sensing Intrinsic Amplitude: 5 mV
Lead Channel Setting Pacing Amplitude: 1 V
Lead Channel Setting Pacing Amplitude: 2.125
Lead Channel Setting Pacing Pulse Width: 0.5 ms
Lead Channel Setting Sensing Sensitivity: 4 mV
Pulse Gen Model: 2272
Pulse Gen Serial Number: 8296661

## 2024-07-12 NOTE — Patient Instructions (Signed)
 Medication Instructions:  STOP amiodarone  *If you need a refill on your cardiac medications before your next appointment, please call your pharmacy*  Lab Work: None ordered If you have labs (blood work) drawn today and your tests are completely normal, you will receive your results only by: MyChart Message (if you have MyChart) OR A paper copy in the mail If you have any lab test that is abnormal or we need to change your treatment, we will call you to review the results.  Follow-Up: At [facility], you and your health needs are our priority.  As part of our continuing mission to provide you with exceptional heart care, our providers are all part of one team.  This team includes your primary Cardiologist (physician) and Advanced Practice Providers or APPs (Physician Assistants and Nurse Practitioners) who all work together to provide you with the care you need, when you need it.  Your next appointment:   1 year(s)  Provider:   Daphne Barrack, NP

## 2024-07-15 ENCOUNTER — Ambulatory Visit: Payer: Self-pay | Admitting: Cardiology

## 2024-07-17 ENCOUNTER — Telehealth: Payer: Self-pay | Admitting: Cardiology

## 2024-07-17 NOTE — Telephone Encounter (Signed)
 Pt c/o medication issue:  1. Name of Medication:     rivaroxaban  (XARELTO ) 20 MG TABS tablet  ranolazine  (RANEXA ) 500 MG 12 hr tablet  amiodarone  (PACERONE ) 200 MG tablet   2. How are you currently taking this medication (dosage and times per day)?   3. Are you having a reaction (difficulty breathing--STAT)?   4. What is your medication issue?   Wife Joya) wants a call back to discuss patient's medication changes

## 2024-07-17 NOTE — Telephone Encounter (Signed)
 Spoke with pt's wife who was verifying that pt was to stop taking the Amiodarone  and asked why the pt was currently taking Ranexa . Verified that that is correct based on Daphne Barrack' OV note from 07/12/24. Pt's wife stated that the pt has not been taking Xarelto  since 1/15 and has been taking the Amiodarone  instead because he thought he was removing the Amiodarone  from him pill box but he actually had removed the Xarelto  and then threw away his Xarelto  rx. Pt's wife stated that she had called her pharmacy already about the Xarelto  and they have another bottle ready to be picked up. Explained that I would run this by Brandi to make sure nothing else needs to be done. Daphne said to make sure the pt has in fact stopped the Amiodarone  and restarts the Xarelto  today and then recommended we have the pt send a remote transmission to our office with his PPM to verify everything looks fine with his heart rhythm. Called pt's wife back to let her know of the plan and she verbalized understanding of plan and had no further questions at this time.

## 2024-07-17 NOTE — Telephone Encounter (Signed)
 Called patient and requested manual transmission  Patient's device having issues  This RN gave the patient the number for the [employer] tech support team to assist with device reset and transmission send  Device team will review transmission once received and follow up with patient if something abnormal is found and requires further intervention. Otherwise, the patient verbalized understanding that no news is good news and we will continue to monitor if everything okay on transmission

## 2024-09-25 ENCOUNTER — Encounter

## 2024-12-25 ENCOUNTER — Encounter

## 2025-03-26 ENCOUNTER — Encounter
